# Patient Record
Sex: Male | Born: 1935 | Race: White | Hispanic: No | Marital: Married | Smoking: Former smoker
Health system: Southern US, Community
[De-identification: ages and names within clinical notes are randomized; demographics above are authoritative.]

## PROBLEM LIST (undated history)

## (undated) DIAGNOSIS — C3490 Malignant neoplasm of unspecified part of unspecified bronchus or lung: Secondary | ICD-10-CM

## (undated) DIAGNOSIS — R911 Solitary pulmonary nodule: Secondary | ICD-10-CM

## (undated) DIAGNOSIS — C3491 Malignant neoplasm of unspecified part of right bronchus or lung: Secondary | ICD-10-CM

## (undated) DIAGNOSIS — E119 Type 2 diabetes mellitus without complications: Secondary | ICD-10-CM

## (undated) DIAGNOSIS — E785 Hyperlipidemia, unspecified: Secondary | ICD-10-CM

## (undated) DIAGNOSIS — I639 Cerebral infarction, unspecified: Secondary | ICD-10-CM

## (undated) DIAGNOSIS — Z8701 Personal history of pneumonia (recurrent): Secondary | ICD-10-CM

## (undated) DIAGNOSIS — K469 Unspecified abdominal hernia without obstruction or gangrene: Secondary | ICD-10-CM

## (undated) DIAGNOSIS — I4891 Unspecified atrial fibrillation: Secondary | ICD-10-CM

## (undated) DIAGNOSIS — L729 Follicular cyst of the skin and subcutaneous tissue, unspecified: Secondary | ICD-10-CM

## (undated) DIAGNOSIS — K5792 Diverticulitis of intestine, part unspecified, without perforation or abscess without bleeding: Secondary | ICD-10-CM

## (undated) DIAGNOSIS — K219 Gastro-esophageal reflux disease without esophagitis: Secondary | ICD-10-CM

## (undated) DIAGNOSIS — M1990 Unspecified osteoarthritis, unspecified site: Secondary | ICD-10-CM

## (undated) DIAGNOSIS — E538 Deficiency of other specified B group vitamins: Secondary | ICD-10-CM

## (undated) HISTORY — PX: BACK SURGERY: SHX140

## (undated) HISTORY — DX: Type 2 diabetes mellitus without complications: E11.9

## (undated) HISTORY — PX: COLONOSCOPY W/ POLYPECTOMY: SHX1380

## (undated) HISTORY — DX: Deficiency of other specified B group vitamins: E53.8

## (undated) HISTORY — DX: Unspecified osteoarthritis, unspecified site: M19.90

## (undated) HISTORY — PX: CHOLECYSTECTOMY: SHX55

## (undated) HISTORY — PX: CYST EXCISION: SHX5701

## (undated) HISTORY — PX: HERNIA REPAIR: SHX51

## (undated) HISTORY — DX: Solitary pulmonary nodule: R91.1

## (undated) HISTORY — PX: LUMBAR DISC SURGERY: SHX700

## (undated) HISTORY — DX: Personal history of pneumonia (recurrent): Z87.01

## (undated) HISTORY — PX: EYE SURGERY: SHX253

## (undated) HISTORY — DX: Unspecified atrial fibrillation: I48.91

## (undated) HISTORY — DX: Malignant neoplasm of unspecified part of right bronchus or lung: C34.91

## (undated) HISTORY — PX: PORTACATH PLACEMENT: SHX2246

## (undated) HISTORY — DX: Malignant neoplasm of unspecified part of unspecified bronchus or lung: C34.90

---

## 2007-09-27 ENCOUNTER — Emergency Department (HOSPITAL_COMMUNITY): Admission: EM | Admit: 2007-09-27 | Discharge: 2007-09-27 | Payer: Self-pay | Admitting: Emergency Medicine

## 2009-01-31 ENCOUNTER — Inpatient Hospital Stay (HOSPITAL_COMMUNITY): Admission: RE | Admit: 2009-01-31 | Discharge: 2009-02-03 | Payer: Self-pay | Admitting: Neurosurgery

## 2010-09-16 LAB — CBC
HCT: 39.3 % (ref 39.0–52.0)
Hemoglobin: 13.3 g/dL (ref 13.0–17.0)
MCHC: 33.7 g/dL (ref 30.0–36.0)
MCV: 91.3 fL (ref 78.0–100.0)
Platelets: 257 10*3/uL (ref 150–400)
RBC: 4.31 MIL/uL (ref 4.22–5.81)
RDW: 13.6 % (ref 11.5–15.5)
WBC: 10.1 10*3/uL (ref 4.0–10.5)

## 2010-09-16 LAB — GLUCOSE, CAPILLARY
Glucose-Capillary: 107 mg/dL — ABNORMAL HIGH (ref 70–99)
Glucose-Capillary: 113 mg/dL — ABNORMAL HIGH (ref 70–99)
Glucose-Capillary: 114 mg/dL — ABNORMAL HIGH (ref 70–99)
Glucose-Capillary: 129 mg/dL — ABNORMAL HIGH (ref 70–99)
Glucose-Capillary: 133 mg/dL — ABNORMAL HIGH (ref 70–99)
Glucose-Capillary: 138 mg/dL — ABNORMAL HIGH (ref 70–99)
Glucose-Capillary: 140 mg/dL — ABNORMAL HIGH (ref 70–99)
Glucose-Capillary: 142 mg/dL — ABNORMAL HIGH (ref 70–99)
Glucose-Capillary: 144 mg/dL — ABNORMAL HIGH (ref 70–99)
Glucose-Capillary: 149 mg/dL — ABNORMAL HIGH (ref 70–99)
Glucose-Capillary: 149 mg/dL — ABNORMAL HIGH (ref 70–99)
Glucose-Capillary: 158 mg/dL — ABNORMAL HIGH (ref 70–99)
Glucose-Capillary: 211 mg/dL — ABNORMAL HIGH (ref 70–99)
Glucose-Capillary: 98 mg/dL (ref 70–99)

## 2010-09-16 LAB — BASIC METABOLIC PANEL
BUN: 10 mg/dL (ref 6–23)
CO2: 26 mEq/L (ref 19–32)
Calcium: 9 mg/dL (ref 8.4–10.5)
Chloride: 106 mEq/L (ref 96–112)
Creatinine, Ser: 0.85 mg/dL (ref 0.4–1.5)
GFR calc Af Amer: >60 mL/min (ref >60)
GFR calc non Af Amer: >60 mL/min (ref >60)
Glucose, Bld: 209 mg/dL — ABNORMAL HIGH (ref 70–99)
Potassium: 4.3 mEq/L (ref 3.5–5.1)
Sodium: 140 mEq/L (ref 135–145)

## 2010-09-16 LAB — TYPE AND SCREEN
ABO/RH(D): B POS
Antibody Screen: NEGATIVE

## 2010-09-16 LAB — ABO/RH: ABO/RH(D): B POS
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
## 2010-10-24 NOTE — Op Note (Signed)
[name], [name]                ACCOUNT NO.:  [id number]   MEDICAL RECORD NO.:  [id number]          PATIENT TYPE:  INP   LOCATION:  3030                         FACILITY:  [facility]   PHYSICIAN:  Hewitt Shorts, M.D.DATE OF BIRTH:  23-Feb-1931   DATE OF PROCEDURE:  01/31/2009  DATE OF DISCHARGE:                               OPERATIVE REPORT   PREOPERATIVE DIAGNOSES:  1. L4-5 dynamic degenerative grade 1 spondylolisthesis.  2. Left L4-5 synovial cyst.  3. Lumbar spondylosis.  4. Lumbar radiculopathy.   POSTOPERATIVE DIAGNOSES:  1. L4-5 dynamic degenerative grade 1 spondylolisthesis.  2. Left L4-5 synovial cyst.  3. Lumbar spondylosis.  4. Lumbar radiculopathy.   PROCEDURE:  1. Bilateral L4-5 lumbar decompression including bilateral laminotomy,      facetectomy, and foraminotomy with decompression of the L4 and L5      nerve roots, with decompression beyond that required for interbody      fusion with microdissection, resection of left L4-5 synovial cyst,      bilateral L4-5 lumbar microdiscectomy and posterior lumbar      interbody fusion with AVS PEEK interbody implants and mosaic with      bone marrow aspirate, and a bilateral L4-5 posterolateral      arthrodesis with radius posterior instrumentation and mosaic with      bone marrow aspirates and Infuse.   SURGEON:  Hewitt Shorts, MD   ASSISTANT:  Nelia Shi. Webb Silversmith, RN  Payton Doughty, MD   ANESTHESIA:  General endotracheal.   INDICATIONS:  The patient is a 77 year old man who presented with a left  lumbar radiculopathy.  He was found to have advanced degeneration at the  L4-5 level with significant facet arthropathy with associated left L4-5  synovial cyst.  X-ray showed a grade 1 dynamic degenerative  spondylolisthesis of L4-5 and a decision was made to proceed with  decompression and arthrodesis.   PROCEDURE:  The patient was brought to the operating room and placed  under general endotracheal anesthesia.  The  patient was turned to a  prone position.  Lumbar region was prepped with Betadine soap and  solution and draped in a sterile fashion.  The midline was infiltrated  with local anesthetic with epinephrine and x-ray was taken.  The L4-5  level was identified and a midline incision was made over the L4-5 level  and carried down through the subcutaneous tissue.  Bipolar cautery and  electrocautery were used to maintain hemostasis.  Dissection was carried  down to the lumbar fascia, which was incised bilaterally.  The  paraspinal muscles were dissected from the spinous process and lamina in  a subperiosteal fashion.  Additional x-rays were taken and the L4-5  intralaminar space was identified.  Dissection was then carried out over  the hypertrophic facet complexes bilaterally and then using  magnification and using microdissection and microsurgical technique.  Bilateral decompression was performed using the [employer]ker high speed drill  and Kerrison punches and double-action rongeurs.  The facets were  hypertrophic and the facets were degenerated and widened.  Bilateral  laminotomy was performed.  Dissection was  carried out laterally and the  transverse process of L4 and L5 were exposed bilaterally and  decorticated.  As we began to remove his ligamentum flavum, on the left  side we encountered the moderate-sized synovial cyst.  This was  carefully freed from the dura and then removed in a piecemeal fashion  and good decompression was achieved of the thecal sac and nerve roots  bilaterally including at L4 and L5 nerve roots bilaterally.  The  decompression was extended laterally to include the facets and then we  exposed the annulus of the L4-5 disk, overlying epidural veins were  coagulated and divided.   We then proceeded with the bilateral L4-5 diskectomy incising the  annulus and continuing the diskectomy using a variety of microcurettes  and pituitary rongeurs.  Spondylotic ridging of  posterior aspect of the  L4 and L5 vertebrae were removed using Kerrison punches and a thorough  diskectomy was performed.  We then worked on removing the cartilaginous  endplates of the vertebral body surfaces down to a good bony surface  with interbody arthrodesis.  We then used paddle curette to further  prepare the endplates and then a sizers to select the appropriate size  interbody implants.   In the end, good decompression of the thecal sac and nerve roots was  achieved bilaterally.  We selected 11-mm wide, 14-mm height, 25-mm  depth, and 4 degrees of lordosis peak implants.   The C-arm fluoroscope was then draped and brought into the field to  provide additional guidance in identifying pedicle entry sites and  probing the pedicles.  We first probed the left L5 pedicle and aspirated  approximately 20 mL of bone marrow aspirate was injected over a 15 mL  strip of mosaic.  We then packed each of the PEEK implants with the  mosaic with bone marrow aspirate and then carefully retracted the thecal  sac and nerve roots, we placed the first implant on the right side.  It  was countersunk.  We then went back to the left side, packed the midline  with additional mosaic with bone marrow aspirate and then again  retracted the thecal sac and nerve root medially and placed the left  side of the implants.  We then packed additional mosaic with bone marrow  aspirate lateral to each of the implants.  Once the interbody  arthrodesis was completed, we proceeded with posterolateral arthrodesis.   Again using the C-arm fluoroscope, pedicle entry sites were identified  for L4 bilaterally as well as the right side at L5.  Each of the 4  pedicles was probed, examined with a ball probe.  Good bony surfaces  were noted.  We then taped each of them with a 5.25 mm tap, again  examined with a ball probe.  Good threading was noted.  Good bony  surfaces were noted and we placed a 5.75 x 40 mm screws  bilaterally at  each level.  Once all 4 screws were in place, we selected 30-mm  prelordosed rods.  Prior to placing the rods, we packed the lateral  gutter over the transverse processes and intertransverse space with  pledgets with Infuse, and the remaining mosaic with bone marrow  aspirates.  We then placed 30-mm rods.  Locking caps were placed.  Final  tightening was done once all 4 locking caps were placed.  Once all 4  locking caps were fully tightened against the counter torque, the wound  which had been irrigated numerous times of the  procedure with saline  solution and Bacitracin solution.  We checked once again for hemostasis.  We examined the thecal sac and nerve roots, which remained well  decompressed and then we proceeded with closure.  Paraspinal muscles  were approximated with interrupted undyed #1 Vicryl sutures.  Deep  fascia was closed with interrupted undyed #1 Vicryl sutures.  The  subcutaneous and subcuticular were closed with interrupted inverted 2-0  and 3-0 undyed Vicryl sutures.  The skin was approximated with  Dermabond.  The wound was dressed with Adaptic and sterile gauze.  The  procedure was tolerated well.  The estimated blood loss was 200  mL.  We did use a cell saver, but the cell saver technician felt that  there was insufficient blood loss to process the collected blood.  Sponge and needle count were correct.  Following surgery, the patient  was to be turned back to a supine position, reversed from the  anesthetic, extubated, and transferred to the recovery room for further  care.      Hewitt Shorts, M.D.  Electronically Signed     RWN/MEDQ  D:  01/31/2009  T:  01/31/2009  Job:  [id number]

## 2010-10-24 NOTE — Discharge Summary (Signed)
[name], [name] NO.:  [id number]   MEDICAL RECORD NO.:  [id number]          PATIENT TYPE:  INP   LOCATION:  3030                         FACILITY:  [facility]   PHYSICIAN:  Hewitt Shorts, M.D.DATE OF BIRTH:  03-22-1931   DATE OF ADMISSION:  01/31/2009  DATE OF DISCHARGE:  02/03/2009                               DISCHARGE SUMMARY   HISTORY OF PRESENT ILLNESS:  This is a 77 year old white male that was  admitted on January 31, 2009, due to L4-5 dynamic  spondylolisthesis at  L4-5.  He also had a synovial cyst at this level with spondylosis and  degenerative disk disease.  He was admitted for decompression and fusion  and underwent procedure on that day.   PROCEDURES:  L4-5 decompression and resection of synovial cyst with PLIF  and PLA of L4-5 level performed by Dr. Shirlean Kelly and assistant was  myself and Dr. Channing Mutters.   The patient was sent to recovery room after surgery and did well there  and was brought subsequently to unit 3000.   HOSPITAL COURSE:  The patient has progressed somewhat slowly over the  past 3 days.  In the first couple days, he had some issues with  dizziness, probably due to the use of the pain medicine.  He was on  morphine PCA for about 24 hours postoperatively and he has been given  Percocet p.o.  He was slow to ambulate for the first 2 days.  He has for  the last day and half he has been ambulating in the hall without  assistance 3-4 times a day.   His wound is clean and dry.  He has been given discharge instructions.  He notes how to use his brace and he knows to have it on when he is out  of bed, but not in bed.   DISCHARGE MEDICATIONS:  Same as admitting medications.  He takes:  1. Terazosin 10 mg overnight.  2. He was taking nabumetone 750 b.i.d., we have discontinued that.  3. Glipizide 5 mg twice a day.  4. Methocarbamol 750 mg as needed.  5. Lisinopril 10 mg half tablet daily.  6. Simvastatin 8 mg half tablet daily.  7. Zolpidem 10 mg overnight.  8. Pantoprazole 40 mg a day.  9. Lotrisone cream as needed.  10.He is being discharged with Percocet 5/325 one to two p.o. q.4-6 h.      p.r.n. pain, I am giving 40 with no refill.   DISCHARGE CONDITION:  Good.   DISCHARGE DIAGNOSES:  Status post L4-5 decompression and resection of  synovial cyst, posterior lumbar interbody fusion, and posterolateral  arthrodesis.   FOLLOWUP:  We will see the patient back in our office in 3 weeks for  followup with a AP and lateral lumbar spine view at that time.  If he  has any difficulty in interim, will give [place] a call.   His discharge instructions again were reinforced with he and his wife.      Russell L. Webb Silversmith, RN      Hewitt Shorts, M.D.  Electronically Signed  RLW/MEDQ  D:  02/03/2009  T:  02/04/2009  Job:  [id number]

## 2012-01-18 ENCOUNTER — Emergency Department (HOSPITAL_COMMUNITY)
Admission: EM | Admit: 2012-01-18 | Discharge: 2012-01-18 | Disposition: A | Discharge status: Home / Self Care | Payer: Medicare Other | Attending: Emergency Medicine | Admitting: Emergency Medicine

## 2012-01-18 ENCOUNTER — Encounter (HOSPITAL_COMMUNITY): Payer: Self-pay | Admitting: *Deleted

## 2012-01-18 DIAGNOSIS — L0292 Furuncle, unspecified: Secondary | ICD-10-CM

## 2012-01-18 DIAGNOSIS — L02429 Furuncle of limb, unspecified: Secondary | ICD-10-CM | POA: Insufficient documentation

## 2012-01-18 DIAGNOSIS — E119 Type 2 diabetes mellitus without complications: Secondary | ICD-10-CM | POA: Insufficient documentation

## 2012-01-18 DIAGNOSIS — Z87891 Personal history of nicotine dependence: Secondary | ICD-10-CM | POA: Insufficient documentation

## 2012-01-18 HISTORY — DX: Follicular cyst of the skin and subcutaneous tissue, unspecified: L72.9

## 2012-01-18 HISTORY — DX: Unspecified abdominal hernia without obstruction or gangrene: K46.9

## 2012-01-18 MED ORDER — DOXYCYCLINE HYCLATE 100 MG PO TABS: 100.0000 mg | ORAL_TABLET | Freq: Two times a day (BID) | ORAL | Status: AC

## 2012-01-18 MED ORDER — MUPIROCIN CALCIUM 2 % NA OINT: TOPICAL_OINTMENT | NASAL | Status: DC

## 2012-01-18 NOTE — ED Notes (Signed)
Rt. Groin abscess/boil, growing over 2 months. Wife sterilized a needle and pop the boil, and [name] drainage (moderate) came out. Here for evaluation.

## 2012-01-18 NOTE — ED Provider Notes (Signed)
History     CSN: [id number]  Arrival date & time 01/18/12  1307   First MD Initiated Contact with Patient 01/18/12 1324      Chief Complaint  Patient presents with  . Abscess    HPI The patient has had an area of skin irritation in the right groin area for a least the last month or 2. Patient states he thinks it got worse recently because he was mowing the lawn in the area got moist and irritated. The patient's wife noticed the swelling in the last day or 2 and she sterilized a needle and popped the boil. She noticed a moderate amount of drainage. The symptoms have improved significantly but because of his history of diabetes she wants to make sure that everything was okay. He has not had any fevers or chills. He has not had any difficulty with urinating. He denies testicular pain. Past Medical History  Diagnosis Date  . Hernia   . Diabetes mellitus   . Cyst of scrotum     Past Surgical History  Procedure Date  . Back surgery     History reviewed. No pertinent family history.  History  Substance Use Topics  . Smoking status: Former Games developer  . Smokeless tobacco: Not on file  . Alcohol Use: No      Review of Systems  All other systems reviewed and are negative.    Allergies  Review of patient's allergies indicates no known allergies.  Home Medications   Current Outpatient Rx  Name Route Sig Dispense Refill  . CARBOXYMETHYLCELLULOSE SODIUM 0.25 % OP SOLN Both Eyes Place 1 drop into both eyes 4 (four) times daily.    Marland Kitchen VITAMIN D 2000 UNITS PO CAPS Oral Take 2 capsules by mouth daily.    Marland Kitchen GLIPIZIDE 5 MG PO TABS Oral Take 2.5 mg by mouth 2 (two) times daily before a meal.    . LISINOPRIL 10 MG PO TABS Oral Take 5 mg by mouth daily.    Marland Kitchen PANTOPRAZOLE SODIUM 40 MG PO TBEC Oral Take 40 mg by mouth daily.    Marland Kitchen SIMVASTATIN 80 MG PO TABS Oral Take 80 mg by mouth at bedtime.    . TERAZOSIN HCL 10 MG PO CAPS Oral Take 10 mg by mouth at bedtime.    Marland Kitchen ZOLPIDEM TARTRATE 10 MG  PO TABS Oral Take 10 mg by mouth at bedtime as needed. For sleep    . DOXYCYCLINE HYCLATE 100 MG PO TABS Oral Take 1 tablet (100 mg total) by mouth 2 (two) times daily. 14 tablet 0  . MUPIROCIN CALCIUM 2 % NA OINT  Apply to rash twice daily 1 g 1    BP 146/61  Pulse 69  Temp 98.2 F (36.8 C) (Oral)  Resp 18  SpO2 95%  Physical Exam  Nursing note and vitals reviewed. Constitutional: He appears well-developed and well-nourished. No distress.  HENT:  Head: Normocephalic and atraumatic.  Right Ear: External ear normal.  Left Ear: External ear normal.  Eyes: Conjunctivae are normal. Right eye exhibits no discharge. Left eye exhibits no discharge. No scleral icterus.  Neck: Neck supple. No tracheal deviation present.  Cardiovascular: Normal rate.   Pulmonary/Chest: Effort normal. No stridor.  Abdominal: Soft. Bowel sounds are normal. He exhibits no distension. There is no tenderness. There is no rebound and no guarding.  Genitourinary: Penis normal.       Right inguinal area with small approximately dime size area of ulceration, no fluctuance, no significant  induration, no lymphangitic streaking, no testicular tenderness  Musculoskeletal: He exhibits no edema and no tenderness.  Neurological: He is alert. He has normal strength. No sensory deficit. Cranial nerve deficit:  no gross defecits noted. He exhibits normal muscle tone. He displays no seizure activity. Coordination normal.  Skin: Skin is warm and dry. No rash noted.  Psychiatric: He has a normal mood and affect.    ED Course  Procedures (including critical care time)  Labs Reviewed - No data to display No results found.   1. Furuncle       MDM  The patient's wife performed an incision and drainage procedure with a sterile needle. This seems to have helped significantly as he only has a small skin ulceration area. There is no evidence to suggest persistent abscess. I will discharge him home on topical and oral  antibiotics. Precautions were discussed.       Celene Kras, MD 01/18/12 1352

## 2012-01-18 NOTE — ED Notes (Signed)
Pt presents with abscess to R groin that began as a "pimple"  X 1 month ago and gradually increased.  Wife reports area was red, swollen and hard, she opened area with yellowish drainage noted on gauze.

## 2012-07-29 ENCOUNTER — Encounter (INDEPENDENT_AMBULATORY_CARE_PROVIDER_SITE_OTHER): Payer: Self-pay | Admitting: *Deleted

## 2012-07-30 ENCOUNTER — Ambulatory Visit (INDEPENDENT_AMBULATORY_CARE_PROVIDER_SITE_OTHER): Payer: Medicare Other | Admitting: Internal Medicine

## 2012-07-30 ENCOUNTER — Encounter (INDEPENDENT_AMBULATORY_CARE_PROVIDER_SITE_OTHER): Payer: Self-pay | Admitting: Internal Medicine

## 2012-07-30 VITALS — BP 110/48 | HR 64 | Temp 98.0°F | Ht 71.0 in | Wt 199.6 lb

## 2012-07-30 DIAGNOSIS — E119 Type 2 diabetes mellitus without complications: Secondary | ICD-10-CM

## 2012-07-30 DIAGNOSIS — R1314 Dysphagia, pharyngoesophageal phase: Secondary | ICD-10-CM

## 2012-07-30 NOTE — Patient Instructions (Addendum)
Pill esophagram.

## 2012-07-30 NOTE — Progress Notes (Addendum)
Subjective:     Patient ID: [name], male   DOB: March 31, 1931, 79 y.o.   MRN: [id number]  HPI Referred to our office for dysphagia. Pills and food are lodging in his esophagus. Symptoms x 6-8 months.He tells me he has to cough the food back up. Appetite is good. No weight loss.  He occasionally bloats. He does c/o of some constipation. He has BM about daily. He takes Miralax for his BMs. Last colonoscopy 2012 by Dr. Joseph Art in [facility].  One 5mm polyp in the mid transverse coon.  Diverticulosis sigmoid colon and descending colon.  Exam otherwise normal.  Family hx of colon cancer. (brother) EGD years ago for abdominal pain. ? Hiatal hernia. Also c/o rt lateral pain: mild. Hx of kidney stones. Wife will get records from [facility].   Review of Systems see hpi Current Outpatient Prescriptions  Medication Sig Dispense Refill  . aspirin 81 MG tablet Take 81 mg by mouth daily.      . Carboxymethylcellulose Sodium 0.25 % SOLN Place 1 drop into both eyes 4 (four) times daily.      . Cholecalciferol (VITAMIN D) 2000 UNITS CAPS Take 2 capsules by mouth daily.      Marland Kitchen glipiZIDE (GLUCOTROL) 5 MG tablet Take 2.5 mg by mouth 2 (two) times daily before a meal.      . lisinopril (PRINIVIL,ZESTRIL) 10 MG tablet Take 5 mg by mouth daily.      . pantoprazole (PROTONIX) 40 MG tablet Take 40 mg by mouth daily.      . simvastatin (ZOCOR) 80 MG tablet Take 80 mg by mouth at bedtime.      Marland Kitchen terazosin (HYTRIN) 10 MG capsule Take 10 mg by mouth at bedtime.      Marland Kitchen zolpidem (AMBIEN) 10 MG tablet Take 10 mg by mouth at bedtime as needed. For sleep      . mupirocin nasal ointment (BACTROBAN) 2 % Apply to rash twice daily  1 g  1   No current facility-administered medications for this visit.   Past Medical History  Diagnosis Date  . Hernia   . Diabetes mellitus   . Cyst of scrotum    Past Surgical History  Procedure Laterality Date  . Back surgery     No Known Allergies      Objective:   Physical Exam   Filed Vitals:   07/30/12 1007  BP: 110/48  Pulse: 64  Temp: 98 F (36.7 C)  Height: 5\' 11"  (1.803 m)  Weight: 199 lb 9.6 oz (90.538 kg)   Alert and oriented. Skin warm and dry. Oral mucosa is moist.   . Sclera anicteric, conjunctivae is pink. Thyroid not enlarged. No cervical lymphadenopathy. Lungs clear. Heart regular rate and rhythm.  Abdomen is soft. Bowel sounds are positive. No hepatomegaly. No abdominal masses felt. No tenderness.  No edema to lower extremities.  .     Assessment:    Dysphagia. Stricture needs to be ruled out.     Plan:    Pill esophagram.Further recommendations to follow.

## 2012-08-05 ENCOUNTER — Ambulatory Visit (HOSPITAL_COMMUNITY)
Admission: RE | Admit: 2012-08-05 | Discharge: 2012-08-05 | Disposition: A | Discharge status: Home / Self Care | Payer: Medicare Other | Source: Ambulatory Visit | Attending: Internal Medicine | Admitting: Internal Medicine

## 2012-08-05 DIAGNOSIS — R1310 Dysphagia, unspecified: Secondary | ICD-10-CM | POA: Insufficient documentation

## 2012-08-05 DIAGNOSIS — R1314 Dysphagia, pharyngoesophageal phase: Secondary | ICD-10-CM

## 2012-08-05 DIAGNOSIS — K222 Esophageal obstruction: Secondary | ICD-10-CM | POA: Insufficient documentation

## 2012-08-05 DIAGNOSIS — K225 Diverticulum of esophagus, acquired: Secondary | ICD-10-CM | POA: Insufficient documentation

## 2012-08-08 ENCOUNTER — Telehealth (INDEPENDENT_AMBULATORY_CARE_PROVIDER_SITE_OTHER): Payer: Self-pay | Admitting: Internal Medicine

## 2012-08-11 ENCOUNTER — Ambulatory Visit (INDEPENDENT_AMBULATORY_CARE_PROVIDER_SITE_OTHER): Payer: Medicare Other | Admitting: Internal Medicine

## 2012-08-11 NOTE — Telephone Encounter (Signed)
Opened in error

## 2012-08-19 ENCOUNTER — Encounter (INDEPENDENT_AMBULATORY_CARE_PROVIDER_SITE_OTHER): Payer: Self-pay | Admitting: Internal Medicine

## 2012-08-19 ENCOUNTER — Ambulatory Visit (INDEPENDENT_AMBULATORY_CARE_PROVIDER_SITE_OTHER): Payer: Medicare Other | Admitting: Internal Medicine

## 2012-08-19 VITALS — BP 136/46 | HR 72 | Ht 69.5 in | Wt 198.9 lb

## 2012-08-19 DIAGNOSIS — R1310 Dysphagia, unspecified: Secondary | ICD-10-CM

## 2012-08-19 DIAGNOSIS — G8929 Other chronic pain: Secondary | ICD-10-CM

## 2012-08-19 DIAGNOSIS — R1011 Right upper quadrant pain: Secondary | ICD-10-CM

## 2012-08-19 NOTE — Progress Notes (Signed)
Subjective:     Patient ID: [name], male   DOB: 12-30-1930, 79 y.o.   MRN: [id number]  HPI Here today for f/u. Recently underwent an Esophagram which revealeI MPRESSION:  Minimal laryngeal penetration without aspiration.  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  Nonobstructing Schatzki ring above a tiny sliding hiatal hernia.   He states today he is doing pretty good. No dysphagia. He is chewing his food well.  He presents today with c/o rt upper quadrant tenderness radiating into his back.  Pain occurs when he moves. Pain for over a year. Pain is not constant. No weight loss. Appetite is good.   Last colonoscopy 2012 by Dr. Joseph Art at [facility] and had one polyp.      Review of Systems Current Outpatient Prescriptions  Medication Sig Dispense Refill  . aspirin 81 MG tablet Take 81 mg by mouth daily.      . Carboxymethylcellulose Sodium 0.25 % SOLN Place 1 drop into both eyes 4 (four) times daily.      . Cholecalciferol (VITAMIN D) 2000 UNITS CAPS Take 2 capsules by mouth daily.      Marland Kitchen glipiZIDE (GLUCOTROL) 5 MG tablet Take 2.5 mg by mouth 2 (two) times daily before a meal.      . lisinopril (PRINIVIL,ZESTRIL) 10 MG tablet Take 5 mg by mouth daily.      . pantoprazole (PROTONIX) 40 MG tablet Take 40 mg by mouth 2 (two) times daily before a meal.       . simvastatin (ZOCOR) 80 MG tablet Take 80 mg by mouth at bedtime.      Marland Kitchen terazosin (HYTRIN) 10 MG capsule Take 10 mg by mouth at bedtime.      Marland Kitchen zolpidem (AMBIEN) 10 MG tablet Take 10 mg by mouth at bedtime as needed. For sleep      . mupirocin nasal ointment (BACTROBAN) 2 % Apply to rash twice daily  1 g  1   No current facility-administered medications for this visit.   Past Medical History  Diagnosis Date  . Hernia   . Diabetes mellitus   . Cyst of scrotum    Past Surgical History  Procedure Laterality Date  . Back surgery     No Known Allergies      Objective:   Physical Exam  Filed  Vitals:   08/19/12 1112  BP: 136/46  Pulse: 72  Height: 5' 9.5" (1.765 m)  Weight: 198 lb 14.4 oz (90.22 kg)   Alert and oriented. Skin warm and dry. Oral mucosa is moist.   . Sclera anicteric, conjunctivae is pink. Thyroid not enlarged. No cervical lymphadenopathy. Lungs clear. Heart regular rate and rhythm.  Abdomen is soft. Bowel sounds are positive. No hepatomegaly. No abdominal masses felt. No tenderness.  No edema to lower extremities. Patient has pain to back and rt upper quadrant when trying to sit up from a prone position today while in office.     Assessment:   Dysphagia which is better now. He does have a Schatzki ring which is non-obstruction. Rt upper quadrant pain on movement.  Suspect is coming from his back.     Plan:    Take tylenol as needed for pain. OV in one year. Talk with PCP concerning back pain.

## 2012-08-19 NOTE — Patient Instructions (Addendum)
OV in yr. Continue the Miralax. Tylenol as needed.

## 2012-09-04 ENCOUNTER — Encounter (INDEPENDENT_AMBULATORY_CARE_PROVIDER_SITE_OTHER): Payer: Self-pay

## 2013-05-27 ENCOUNTER — Encounter (INDEPENDENT_AMBULATORY_CARE_PROVIDER_SITE_OTHER): Payer: Self-pay | Admitting: *Deleted

## 2013-08-25 ENCOUNTER — Ambulatory Visit (INDEPENDENT_AMBULATORY_CARE_PROVIDER_SITE_OTHER): Payer: Medicare Other | Admitting: Internal Medicine

## 2013-09-18 ENCOUNTER — Encounter (HOSPITAL_COMMUNITY): Payer: Self-pay | Admitting: Emergency Medicine

## 2013-09-18 ENCOUNTER — Emergency Department (HOSPITAL_COMMUNITY): Payer: Medicare Other

## 2013-09-18 ENCOUNTER — Emergency Department (HOSPITAL_COMMUNITY)
Admission: EM | Admit: 2013-09-18 | Discharge: 2013-09-18 | Disposition: A | Discharge status: Home / Self Care | Payer: Medicare Other | Attending: Emergency Medicine | Admitting: Emergency Medicine

## 2013-09-18 DIAGNOSIS — Y92009 Unspecified place in unspecified non-institutional (private) residence as the place of occurrence of the external cause: Secondary | ICD-10-CM | POA: Insufficient documentation

## 2013-09-18 DIAGNOSIS — Y939 Activity, unspecified: Secondary | ICD-10-CM | POA: Insufficient documentation

## 2013-09-18 DIAGNOSIS — Z79899 Other long term (current) drug therapy: Secondary | ICD-10-CM | POA: Insufficient documentation

## 2013-09-18 DIAGNOSIS — Z8719 Personal history of other diseases of the digestive system: Secondary | ICD-10-CM | POA: Insufficient documentation

## 2013-09-18 DIAGNOSIS — R296 Repeated falls: Secondary | ICD-10-CM | POA: Insufficient documentation

## 2013-09-18 DIAGNOSIS — W19XXXA Unspecified fall, initial encounter: Secondary | ICD-10-CM

## 2013-09-18 DIAGNOSIS — Z7982 Long term (current) use of aspirin: Secondary | ICD-10-CM | POA: Insufficient documentation

## 2013-09-18 DIAGNOSIS — S335XXA Sprain of ligaments of lumbar spine, initial encounter: Secondary | ICD-10-CM | POA: Insufficient documentation

## 2013-09-18 DIAGNOSIS — S39012A Strain of muscle, fascia and tendon of lower back, initial encounter: Secondary | ICD-10-CM

## 2013-09-18 DIAGNOSIS — Z872 Personal history of diseases of the skin and subcutaneous tissue: Secondary | ICD-10-CM | POA: Insufficient documentation

## 2013-09-18 DIAGNOSIS — Z87891 Personal history of nicotine dependence: Secondary | ICD-10-CM | POA: Insufficient documentation

## 2013-09-18 DIAGNOSIS — E119 Type 2 diabetes mellitus without complications: Secondary | ICD-10-CM | POA: Insufficient documentation

## 2013-09-18 LAB — URINE MICROSCOPIC-ADD ON

## 2013-09-18 LAB — URINALYSIS, ROUTINE W REFLEX MICROSCOPIC
Bilirubin Urine: NEGATIVE
Glucose, UA: NEGATIVE mg/dL
Ketones, ur: NEGATIVE mg/dL
Leukocytes, UA: NEGATIVE
Nitrite: NEGATIVE
Protein, ur: NEGATIVE mg/dL
Specific Gravity, Urine: <1.005 — ABNORMAL LOW (ref 1.005–1.030)
Urobilinogen, UA: 0.2 mg/dL (ref 0.0–1.0)
pH: 7 (ref 5.0–8.0)

## 2013-09-18 MED ORDER — CYCLOBENZAPRINE HCL 10 MG PO TABS: 10.0000 mg | ORAL_TABLET | Freq: Three times a day (TID) | ORAL | Status: DC | PRN

## 2013-09-18 MED ORDER — IBUPROFEN 600 MG PO TABS: 600.0000 mg | ORAL_TABLET | Freq: Three times a day (TID) | ORAL | Status: DC

## 2013-09-18 NOTE — ED Notes (Signed)
Fell at home a week ago, pain in right flank area, hurts to walk or move, states it is getting worse, history of back surgery

## 2013-09-18 NOTE — Discharge Instructions (Signed)
Lumbosacral Strain Lumbosacral strain is a strain of any of the parts that make up your lumbosacral vertebrae. Your lumbosacral vertebrae are the bones that make up the lower third of your backbone. Your lumbosacral vertebrae are held together by muscles and tough, fibrous tissue (ligaments).  CAUSES  A sudden blow to your back can cause lumbosacral strain. Also, anything that causes an excessive stretch of the muscles in the low back can cause this strain. This is typically seen when people exert themselves strenuously, fall, lift heavy objects, bend, or crouch repeatedly. RISK FACTORS  Physically demanding work.  Participation in pushing or pulling sports or sports that require sudden twist of the back (tennis, golf, baseball).  Weight lifting.  Excessive lower back curvature.  Forward-tilted pelvis.  Weak back or abdominal muscles or both.  Tight hamstrings. SIGNS AND SYMPTOMS  Lumbosacral strain may cause pain in the area of your injury or pain that moves (radiates) down your leg.  DIAGNOSIS Your health care provider can often diagnose lumbosacral strain through a physical exam. In some cases, you may need tests such as X-ray exams.  TREATMENT  Treatment for your lower back injury depends on many factors that your clinician will have to evaluate. However, most treatment will include the use of anti-inflammatory medicines. HOME CARE INSTRUCTIONS   Avoid hard physical activities (tennis, racquetball, waterskiing) if you are not in proper physical condition for it. This may aggravate or create problems.  If you have a back problem, avoid sports requiring sudden body movements. Swimming and walking are generally safer activities.  Maintain good posture.  Maintain a healthy weight.  For acute conditions, you may put ice on the injured area.  Put ice in a plastic bag.  Place a towel between your skin and the bag.  Leave the ice on for 20 minutes, 2 3 times a day.  When the  low back starts healing, stretching and strengthening exercises may be recommended. SEEK MEDICAL CARE IF:  Your back pain is getting worse.  You experience severe back pain not relieved with medicines. SEEK IMMEDIATE MEDICAL CARE IF:   You have numbness, tingling, weakness, or problems with the use of your arms or legs.  There is a change in bowel or bladder control.  You have increasing pain in any area of the body, including your belly (abdomen).  You notice shortness of breath, dizziness, or feel faint.  You feel sick to your stomach (nauseous), are throwing up (vomiting), or become sweaty.  You notice discoloration of your toes or legs, or your feet get very cold. MAKE SURE YOU:   Understand these instructions.  Will watch your condition.  Will get help right away if you are not doing well or get worse. Document Released: 03/07/2005 Document Revised: 03/18/2013 Document Reviewed: 01/14/2013 Exit[employer] Patient Information 2014 [employer].  

## 2013-09-18 NOTE — ED Provider Notes (Signed)
CSN: [id number]     Arrival date & time 09/18/13  1012 History   First MD Initiated Contact with Patient 09/18/13 1052     Chief Complaint  Patient presents with  . Fall     (Consider location/radiation/quality/duration/timing/severity/associated sxs/prior Treatment) HPI Comments: [name] is a 80 y.o. male who presents to the Emergency Department complaining of persistent pain to the right flank and lower back.  Pain has been persistent for one week after a mechanical fall. Pain is worse with movement and improves with rest.  He denies head injury, LOC or neck pain.  He also denies hematuria, numbness or weakness of the extremities, incontinence/retention of bladder or bowel,  shortness of breath, dizziness, or pain radiating to the lower extremities.    The history is provided by the patient.    Past Medical History  Diagnosis Date  . Hernia   . Diabetes mellitus   . Cyst of scrotum    Past Surgical History  Procedure Laterality Date  . Back surgery     No family history on file. History  Substance Use Topics  . Smoking status: Former Research scientist (life sciences)  . Smokeless tobacco: Not on file  . Alcohol Use: No    Review of Systems  Constitutional: Negative for fever.  Respiratory: Negative for chest tightness and shortness of breath.   Gastrointestinal: Negative for vomiting, abdominal pain and constipation.  Genitourinary: Negative for dysuria, hematuria, flank pain, decreased urine volume and difficulty urinating.       No perineal numbness or incontinence of urine or feces  Musculoskeletal: Positive for back pain. Negative for joint swelling.  Skin: Negative for rash.  Neurological: Negative for dizziness, syncope, weakness, numbness and headaches.  All other systems reviewed and are negative.     Allergies  Review of patient's allergies indicates no known allergies.  Home Medications   Current Outpatient Rx  Name  Route  Sig  Dispense  Refill  . aspirin 81 MG tablet   Oral   Take 81 mg by mouth daily.         . Carboxymethylcellulose Sodium 0.25 % SOLN   Both Eyes   Place 1 drop into both eyes daily.          . Cholecalciferol (VITAMIN D) 2000 UNITS CAPS   Oral   Take 1 capsule by mouth daily.          Marland Kitchen glipiZIDE (GLUCOTROL) 5 MG tablet   Oral   Take 2.5 mg by mouth 2 (two) times daily before a meal.         . lisinopril (PRINIVIL,ZESTRIL) 10 MG tablet   Oral   Take 5 mg by mouth daily.         Marland Kitchen loratadine (CLARITIN) 10 MG tablet   Oral   Take 10 mg by mouth daily.         . pantoprazole (PROTONIX) 40 MG tablet   Oral   Take 40 mg by mouth daily.          . simvastatin (ZOCOR) 80 MG tablet   Oral   Take 40 mg by mouth at bedtime.          Marland Kitchen terazosin (HYTRIN) 10 MG capsule   Oral   Take 10 mg by mouth at bedtime.         Marland Kitchen zolpidem (AMBIEN) 10 MG tablet   Oral   Take 5 mg by mouth at bedtime as needed for sleep. For sleep  BP 125/48  Pulse 64  Temp(Src) 98 F (36.7 C) (Oral)  Resp 17  Ht 5\' 10"  (1.778 m)  Wt 205 lb (92.987 kg)  BMI 29.41 kg/m2  SpO2 92% Physical Exam  Nursing note and vitals reviewed. Constitutional: He is oriented to person, place, and time. He appears well-developed and well-nourished. No distress.  HENT:  Head: Normocephalic and atraumatic.  Neck: Normal range of motion. Neck supple.  Cardiovascular: Normal rate, regular rhythm, normal heart sounds and intact distal pulses.   No murmur heard. Pulmonary/Chest: Effort normal and breath sounds normal. No respiratory distress. He has no wheezes. He has no rales. He exhibits no tenderness.  Abdominal: Soft. He exhibits no distension and no mass. There is no tenderness. There is no rebound and no guarding.  Musculoskeletal: He exhibits tenderness. He exhibits no edema.       Lumbar back: He exhibits tenderness and pain. He exhibits normal range of motion, no swelling, no deformity, no laceration and normal pulse.  ttp of the  right lumbar paraspinal muscles.  No spinal tenderness.  DP pulses are brisk and symmetrical.  Distal sensation intact.  Hip Flexors/Extensors are intact.  Pt has normal strength against resistance of bilateral lower extremities.     Neurological: He is alert and oriented to person, place, and time. He has normal strength. No sensory deficit. He exhibits normal muscle tone. Coordination and gait normal.  Reflex Scores:      Patellar reflexes are 2+ on the right side and 2+ on the left side.      Achilles reflexes are 2+ on the right side and 2+ on the left side. Skin: Skin is warm and dry. No rash noted.    ED Course  Procedures (including critical care time) Labs Review Labs Reviewed  URINALYSIS, ROUTINE W REFLEX MICROSCOPIC - Abnormal; Notable for the following:    Specific Gravity, Urine <1.005 (*)    Hgb urine dipstick TRACE (*)    All other components within normal limits  URINE MICROSCOPIC-ADD ON   Imaging Review Dg Ribs Unilateral W/chest Right  09/18/2013   CLINICAL DATA:  Right-sided rib pain status post fall  EXAM: RIGHT RIBS AND CHEST - 3+ VIEW  COMPARISON:  Chest x-ray dated January 27, 2009  FINDINGS: The lungs are adequately inflated. There is no focal infiltrate. Coarse lung markings at the left lung base are stable. The cardiac silhouette is normal in size. The pulmonary vascularity is not engorged. The mediastinum is normal in width. There is no pleural effusion or pneumothorax. The right rib detail films reveal no evidence of an acute displaced or nondisplaced rib fracture. The gas pattern in the right upper quadrant of the abdomen is within the limits of normal.  IMPRESSION: There is no acute bony abnormality of the right rib cage. There is no evidence of active cardiopulmonary disease.   Electronically Signed   By: David  Martinique   On: 09/18/2013 13:01   Dg Lumbar Spine Complete  09/18/2013   CLINICAL DATA:  Low back pain status post fall history of previous posterior fusion  at L4-5.  EXAM: LUMBAR SPINE - COMPLETE 4+ VIEW  COMPARISON:  Lumbar spine series dated is January 27, 2010  FINDINGS: The lumbar vertebral bodies are preserved in height. The intervertebral disc space heights are well maintained. There is no pars defect nor spondylolisthesis. The metallic fusion devices at the L4 and L5 levels appear intact.  IMPRESSION: There is no acute fracture or dislocation of the lumbar  spine.   Electronically Signed   By: David  Martinique   On: 09/18/2013 13:04   Dg Pelvis 1-2 Views  09/18/2013   CLINICAL DATA:  Low back pain status post fall.  EXAM: PELVIS - 1-2 VIEW  COMPARISON:  None  FINDINGS: The bony pelvis appears adequately mineralized. The hip joint spaces are preserved. No acute fracture nor dislocation is demonstrated. Post fusion changes at L4-5 are demonstrated.  IMPRESSION: There is no acute bony abnormality of the pelvis. The observed portions of the sacrum appear normal.   Electronically Signed   By: David  Martinique   On: 09/18/2013 13:02     EKG Interpretation None      MDM   Final diagnoses:  Lumbar strain  Fall    Patient is ambulatory, with steady gait.  No focal neuro deficits on exam.  No concerning symptoms for emergent neurological or infectious process .  Likely musculoskeletal injury.  Pt agrees to symptomatic treatment and also agrees to return here for any worsening symptoms  Patient also seen by Dr. Roderic Palau and care plan discussed.      Srikar Chiang L. Vanessa Fauquier, PA-C 09/20/13 1435

## 2013-09-18 NOTE — ED Notes (Addendum)
Pt up to ambulate .  Pt appears steady and has no difficulty ambulating.   States he is having a small amount of pain in the mid back area with ambulation.  States he feels like his legs are weak.

## 2013-09-20 NOTE — ED Provider Notes (Signed)
Medical screening examination/treatment/procedure(s) were performed by non-physician practitioner and as supervising physician I was immediately available for consultation/collaboration.   EKG Interpretation None        Maudry Diego, MD 09/20/13 1504

## 2014-05-27 ENCOUNTER — Emergency Department (INDEPENDENT_AMBULATORY_CARE_PROVIDER_SITE_OTHER): Payer: Medicare Other

## 2014-05-27 ENCOUNTER — Emergency Department (INDEPENDENT_AMBULATORY_CARE_PROVIDER_SITE_OTHER)
Admission: EM | Admit: 2014-05-27 | Discharge: 2014-05-27 | Disposition: A | Discharge status: Home / Self Care | Payer: Medicare Other | Source: Home / Self Care | Attending: Emergency Medicine | Admitting: Emergency Medicine

## 2014-05-27 ENCOUNTER — Encounter: Payer: Self-pay | Admitting: *Deleted

## 2014-05-27 DIAGNOSIS — S20211A Contusion of right front wall of thorax, initial encounter: Secondary | ICD-10-CM

## 2014-05-27 DIAGNOSIS — R0781 Pleurodynia: Secondary | ICD-10-CM

## 2014-05-27 DIAGNOSIS — W19XXXA Unspecified fall, initial encounter: Secondary | ICD-10-CM

## 2014-05-27 DIAGNOSIS — R52 Pain, unspecified: Secondary | ICD-10-CM

## 2014-05-27 HISTORY — DX: Hyperlipidemia, unspecified: E78.5

## 2014-05-27 NOTE — ED Notes (Signed)
[name] c/o RUQ abd and Rib area pain post fall yesterday.

## 2014-05-27 NOTE — ED Provider Notes (Signed)
CSN: [id number]     Arrival date & time 05/27/14  1002 History   First MD Initiated Contact with Patient 05/27/14 1035     Chief Complaint  Patient presents with  . Fall   (Consider location/radiation/quality/duration/timing/severity/associated sxs/prior Treatment) Patient is a 80 y.o. male presenting with fall. The history is provided by the patient. No language interpreter was used.  Fall This is a new problem. The current episode started yesterday. The problem occurs constantly. The problem has been gradually worsening. Associated symptoms include chest pain. Nothing aggravates the symptoms. He has tried nothing for the symptoms. The treatment provided no relief.  Pt fell yesterday.  Pt reports he hit on back and buttock.   Pt complains of pain to right side of back and right upper chest and abdomen.  Pt reports pain with lying down  Past Medical History  Diagnosis Date  . Hernia   . Diabetes mellitus   . Cyst of scrotum   . Hyperlipidemia    Past Surgical History  Procedure Laterality Date  . Back surgery    . Cholecystectomy     Family History  Problem Relation Age of Onset  . Cancer Brother     colon CA   History  Substance Use Topics  . Smoking status: Former Smoker    Quit date: 05/27/2004  . Smokeless tobacco: Not on file  . Alcohol Use: No    Review of Systems  Cardiovascular: Positive for chest pain.  All other systems reviewed and are negative.   Allergies  Review of patient's allergies indicates no known allergies.  Home Medications   Prior to Admission medications   Medication Sig Start Date End Date Taking? Authorizing Provider  aspirin 81 MG tablet Take 81 mg by mouth daily.   Yes Historical Provider, MD  Cholecalciferol (VITAMIN D) 2000 UNITS CAPS Take 1 capsule by mouth daily.    Yes Historical Provider, MD  glipiZIDE (GLUCOTROL) 5 MG tablet Take 2.5 mg by mouth 2 (two) times daily before a meal.   Yes Historical Provider, MD  lisinopril  (PRINIVIL,ZESTRIL) 10 MG tablet Take 5 mg by mouth daily.   Yes Historical Provider, MD  pantoprazole (PROTONIX) 40 MG tablet Take 40 mg by mouth daily.    Yes Historical Provider, MD  simvastatin (ZOCOR) 80 MG tablet Take 40 mg by mouth at bedtime.    Yes Historical Provider, MD  terazosin (HYTRIN) 10 MG capsule Take 10 mg by mouth at bedtime.   Yes Historical Provider, MD  Carboxymethylcellulose Sodium 0.25 % SOLN Place 1 drop into both eyes daily.     Historical Provider, MD  cyclobenzaprine (FLEXERIL) 10 MG tablet Take 1 tablet (10 mg total) by mouth 3 (three) times daily as needed. 09/18/13   Tammy L. Triplett, PA-C  ibuprofen (ADVIL,MOTRIN) 600 MG tablet Take 1 tablet (600 mg total) by mouth 3 (three) times daily. Take with food 09/18/13   Tammy L. Triplett, PA-C  loratadine (CLARITIN) 10 MG tablet Take 10 mg by mouth daily.    Historical Provider, MD  zolpidem (AMBIEN) 10 MG tablet Take 5 mg by mouth at bedtime as needed for sleep. For sleep    Historical Provider, MD   BP 151/62 mmHg  Pulse 71  Temp(Src) 97.4 F (36.3 C) (Oral)  Resp 18  Ht 5\' 10"  (1.778 m)  Wt 199 lb (90.266 kg)  BMI 28.55 kg/m2  SpO2 96% Physical Exam  Constitutional: He appears well-developed and well-nourished.  HENT:  Head: Normocephalic.  Eyes: Pupils are equal, round, and reactive to light.  Cardiovascular: Normal rate and regular rhythm.   Pulmonary/Chest: Effort normal.  Abdominal: Soft. There is no tenderness.  Large hernia  Reduces easily  Musculoskeletal: Normal range of motion.  t and ls spine nontender  Neurological: He is alert.  Skin: Skin is warm.  Psychiatric: He has a normal mood and affect.  Nursing note and vitals reviewed.   ED Course  Procedures (including critical care time) Labs Review Labs Reviewed - No data to display  Imaging Review Dg Chest 2 View  05/27/2014   CLINICAL DATA:  Right-sided lower rib pain following fall  EXAM: CHEST  2 VIEW  COMPARISON:  September 18, 2013   FINDINGS: There is an apparent nipple shadow overlying the right base. There is no edema or consolidation. The heart size and pulmonary vascularity are normal. There is atherosclerotic change in the aorta. No adenopathy. No pneumothorax. There is degenerative change in the thoracic spine. No demonstrable rib fracture.  IMPRESSION: No edema or consolidation. No pneumothorax. No rib fracture apparent. If there remains concern for potential rib trauma, detailed rib series may be reasonable to further assess.   Electronically Signed   By: Lowella Grip M.D.   On: 05/27/2014 12:18     MDM  Chest xray shows no rib fracture.   Pt's impact was n back.   He has some right uper abdomen sorness.  (Pt reports he chronically is sore in that area and has had evaluations for )  Pt declines medication for pain.   I advised tylenol.   Pt advised if pain worsen or changes he shoulder go to ED for further evalution   1. Contusion, chest wall, right, initial encounter   2. Fall   3. Pain    VS Tylenol    Fransico Meadow, PA-C 05/27/14 1249

## 2014-05-27 NOTE — Discharge Instructions (Signed)
Contusion °A contusion is a deep bruise. Contusions are the result of an injury that caused bleeding under the skin. The contusion may turn blue, purple, or yellow. Minor injuries will give you a painless contusion, but more severe contusions may stay painful and swollen for a few weeks.  °CAUSES  °A contusion is usually caused by a blow, trauma, or direct force to an area of the body. °SYMPTOMS  °· Swelling and redness of the injured area. °· Bruising of the injured area. °· Tenderness and soreness of the injured area. °· Pain. °DIAGNOSIS  °The diagnosis can be made by taking a history and physical exam. An X-ray, CT scan, or MRI may be needed to determine if there were any associated injuries, such as fractures. °TREATMENT  °Specific treatment will depend on what area of the body was injured. In general, the best treatment for a contusion is resting, icing, elevating, and applying cold compresses to the injured area. Over-the-counter medicines may also be recommended for pain control. Ask your caregiver what the best treatment is for your contusion. °HOME CARE INSTRUCTIONS  °· Put ice on the injured area. °¨ Put ice in a plastic bag. °¨ Place a towel between your skin and the bag. °¨ Leave the ice on for 15-20 minutes, 3-4 times a day, or as directed by your health care provider. °· Only take over-the-counter or prescription medicines for pain, discomfort, or fever as directed by your caregiver. Your caregiver may recommend avoiding anti-inflammatory medicines (aspirin, ibuprofen, and naproxen) for 48 hours because these medicines may increase bruising. °· Rest the injured area. °· If possible, elevate the injured area to reduce swelling. °SEEK IMMEDIATE MEDICAL CARE IF:  °· You have increased bruising or swelling. °· You have pain that is getting worse. °· Your swelling or pain is not relieved with medicines. °MAKE SURE YOU:  °· Understand these instructions. °· Will watch your condition. °· Will get help right  away if you are not doing well or get worse. °Document Released: 03/07/2005 Document Revised: 06/02/2013 Document Reviewed: 04/02/2011 °[employer]® Patient Information ©2015 [employer]. This information is not intended to replace advice given to you by your health care provider. Make sure you discuss any questions you have with your health care provider. ° °

## 2014-05-28 ENCOUNTER — Telehealth: Payer: Self-pay | Admitting: Emergency Medicine

## 2014-05-28 NOTE — ED Notes (Signed)
Inquired about patient's status; encourage them to call with questions/concerns.  

## 2015-05-19 ENCOUNTER — Encounter: Payer: Self-pay | Admitting: *Deleted

## 2015-05-19 ENCOUNTER — Emergency Department (INDEPENDENT_AMBULATORY_CARE_PROVIDER_SITE_OTHER): Payer: Medicare Other

## 2015-05-19 ENCOUNTER — Emergency Department (INDEPENDENT_AMBULATORY_CARE_PROVIDER_SITE_OTHER)
Admission: EM | Admit: 2015-05-19 | Discharge: 2015-05-19 | Disposition: A | Discharge status: Home / Self Care | Payer: Medicare Other | Source: Home / Self Care | Attending: Family Medicine | Admitting: Family Medicine

## 2015-05-19 DIAGNOSIS — Z9889 Other specified postprocedural states: Secondary | ICD-10-CM | POA: Diagnosis not present

## 2015-05-19 DIAGNOSIS — M545 Low back pain, unspecified: Secondary | ICD-10-CM

## 2015-05-19 DIAGNOSIS — G8929 Other chronic pain: Secondary | ICD-10-CM | POA: Diagnosis not present

## 2015-05-19 MED ORDER — TRAMADOL HCL 50 MG PO TABS: 50.0000 mg | ORAL_TABLET | Freq: Four times a day (QID) | ORAL | Status: DC | PRN

## 2015-05-19 MED ORDER — IBUPROFEN 600 MG PO TABS
600.0000 mg | ORAL_TABLET | Freq: Once | ORAL | Status: AC
  Administered 2015-05-19: 600 mg via ORAL

## 2015-05-19 NOTE — Discharge Instructions (Signed)
Tramadol is strong pain medication. While taking, do not drink alcohol, drive, or perform any other activities that requires focus while taking these medications.    Back Exercises If you have pain in your back, do these exercises 2-3 times each day or as told by your doctor. When the pain goes away, do the exercises once each day, but repeat the steps more times for each exercise (do more repetitions). If you do not have pain in your back, do these exercises once each day or as told by your doctor. EXERCISES Single Knee to Chest Do these steps 3-5 times in a row for each leg:  Lie on your back on a firm bed or the floor with your legs stretched out.  Bring one knee to your chest.  Hold your knee to your chest by grabbing your knee or thigh.  Pull on your knee until you feel a gentle stretch in your lower back.  Keep doing the stretch for 10-30 seconds.  Slowly let go of your leg and straighten it. Pelvic Tilt Do these steps 5-10 times in a row:  Lie on your back on a firm bed or the floor with your legs stretched out.  Bend your knees so they point up to the ceiling. Your feet should be flat on the floor.  Tighten your lower belly (abdomen) muscles to press your lower back against the floor. This will make your tailbone point up to the ceiling instead of pointing down to your feet or the floor.  Stay in this position for 5-10 seconds while you gently tighten your muscles and breathe evenly. Cat-Cow Do these steps until your lower back bends more easily:  Get on your hands and knees on a firm surface. Keep your hands under your shoulders, and keep your knees under your hips. You may put padding under your knees.  Let your head hang down, and make your tailbone point down to the floor so your lower back is round like the back of a cat.  Stay in this position for 5 seconds.  Slowly lift your head and make your tailbone point up to the ceiling so your back hangs low (sags) like  the back of a cow.  Stay in this position for 5 seconds. Press-Ups Do these steps 5-10 times in a row:  Lie on your belly (face-down) on the floor.  Place your hands near your head, about shoulder-width apart.  While you keep your back relaxed and keep your hips on the floor, slowly straighten your arms to raise the top half of your body and lift your shoulders. Do not use your back muscles. To make yourself more comfortable, you may change where you place your hands.  Stay in this position for 5 seconds.  Slowly return to lying flat on the floor. Bridges Do these steps 10 times in a row:  Lie on your back on a firm surface.  Bend your knees so they point up to the ceiling. Your feet should be flat on the floor.  Tighten your butt muscles and lift your butt off of the floor until your waist is almost as high as your knees. If you do not feel the muscles working in your butt and the back of your thighs, slide your feet 1-2 inches farther away from your butt.  Stay in this position for 3-5 seconds.  Slowly lower your butt to the floor, and let your butt muscles relax. If this exercise is too easy, try doing  it with your arms crossed over your chest. Belly Crunches Do these steps 5-10 times in a row:  Lie on your back on a firm bed or the floor with your legs stretched out.  Bend your knees so they point up to the ceiling. Your feet should be flat on the floor.  Cross your arms over your chest.  Tip your chin a little bit toward your chest but do not bend your neck.  Tighten your belly muscles and slowly raise your chest just enough to lift your shoulder blades a tiny bit off of the floor.  Slowly lower your chest and your head to the floor. Back Lifts Do these steps 5-10 times in a row: 1. Lie on your belly (face-down) with your arms at your sides, and rest your forehead on the floor. 2. Tighten the muscles in your legs and your butt. 3. Slowly lift your chest off of the  floor while you keep your hips on the floor. Keep the back of your head in line with the curve in your back. Look at the floor while you do this. 4. Stay in this position for 3-5 seconds. 5. Slowly lower your chest and your face to the floor. GET HELP IF:  Your back pain gets a lot worse when you do an exercise.  Your back pain does not lessen 2 hours after you exercise. If you have any of these problems, stop doing the exercises. Do not do them again unless your doctor says it is okay. GET HELP RIGHT AWAY IF:  You have sudden, very bad back pain. If this happens, stop doing the exercises. Do not do them again unless your doctor says it is okay.   This information is not intended to replace advice given to you by your health care provider. Make sure you discuss any questions you have with your health care provider.   Document Released: 06/30/2010 Document Revised: 02/16/2015 Document Reviewed: 07/22/2014 Elsevier Interactive Patient Education 2016 [place] Injury Prevention Back injuries can be very painful. They can also be difficult to heal. After having one back injury, you are more likely to injure your back again. It is important to learn how to avoid injuring or re-injuring your back. The following tips can help you to prevent a back injury. WHAT SHOULD I KNOW ABOUT PHYSICAL FITNESS?  Exercise for 30 minutes per day on most days of the week or as told by your doctor. Make sure to:  Do aerobic exercises, such as walking, jogging, biking, or swimming.  Do exercises that increase balance and strength, such as tai chi and yoga.  Do stretching exercises. This helps with flexibility.  Try to develop strong belly (abdominal) muscles. Your belly muscles help to support your back.  Stay at a healthy weight. This helps to decrease your risk of a back injury. WHAT SHOULD I KNOW ABOUT MY DIET?  Talk with your doctor about your overall diet. Take supplements and vitamins only  as told by your doctor.  Talk with your doctor about how much calcium and vitamin D you need each day. These nutrients help to prevent weakening of the bones (osteoporosis).  Include good sources of calcium in your diet, such as:  Dairy products.  Green leafy vegetables.  Products that have had calcium added to them (fortified).  Include good sources of vitamin D in your diet, such as:  Milk.  Foods that have had vitamin D added to them. WHAT SHOULD I KNOW  ABOUT MY POSTURE?  Sit up straight and stand up straight. Avoid leaning forward when you sit or hunching over when you stand.  Choose chairs that have good low-back (lumbar) support.  If you work at a desk, sit close to it so you do not need to lean over. Keep your chin tucked in. Keep your neck drawn back. Keep your elbows bent so your arms look like the letter "L" (right angle).  Sit high and close to the steering wheel when you drive. Add a low-back support to your car seat, if needed.  Avoid sitting or standing in one position for very long. Take breaks to get up, stretch, and walk around at least one time every hour. Take breaks every hour if you are driving for long periods of time.  Sleep on your side with your knees slightly bent, or sleep on your back with a pillow under your knees. Do not lie on the front of your body to sleep. WHAT SHOULD I KNOW ABOUT LIFTING, TWISTING, AND REACHING Lifting and Heavy Lifting  Avoid heavy lifting, especially lifting over and over again. If you must do heavy lifting:  Stretch before lifting.  Work slowly.  Rest between lifts.  Use a tool such as a cart or a dolly to move objects if one is available.  Make several small trips instead of carrying one heavy load.  Ask for help when you need it, especially when moving big objects.  Follow these steps when lifting:  Stand with your feet shoulder-width apart.  Get as close to the object as you can. Do not pick up a heavy  object that is far from your body.  Use handles or lifting straps if they are available.  Bend at your knees. Squat down, but keep your heels off the floor.  Keep your shoulders back. Keep your chin tucked in. Keep your back straight.  Lift the object slowly while you tighten the muscles in your legs, belly, and butt. Keep the object as close to the center of your body as possible.  Follow these steps when putting down a heavy load:  Stand with your feet shoulder-width apart.  Lower the object slowly while you tighten the muscles in your legs, belly, and butt. Keep the object as close to the center of your body as possible.  Keep your shoulders back. Keep your chin tucked in. Keep your back straight.  Bend at your knees. Squat down, but keep your heels off the floor.  Use handles or lifting straps if they are available. Twisting and Reaching  Avoid lifting heavy objects above your waist.  Do not twist at your waist while you are lifting or carrying a load. If you need to turn, move your feet.  Do not bend over without bending at your knees.  Avoid reaching over your head, across a table, or for an object on a high surface.  WHAT ARE SOME OTHER TIPS?  Avoid wet floors and icy ground. Keep sidewalks clear of ice to prevent falls.   Do not sleep on a mattress that is too soft or too hard.   Keep items that you use often within easy reach.   Put heavier objects on shelves at waist level, and put lighter objects on lower or higher shelves.  Find ways to lower your stress, such as:  Exercise.  Massage.  Relaxation techniques.  Talk with your doctor if you feel anxious or depressed. These conditions can make back pain worse.  Wear flat heel shoes with cushioned soles.  Avoid making quick (sudden) movements.  Use both shoulder straps when carrying a backpack.  Do not use any tobacco products, including cigarettes, chewing tobacco, or electronic cigarettes. If you  need help quitting, ask your doctor.   This information is not intended to replace advice given to you by your health care provider. Make sure you discuss any questions you have with your health care provider.   Document Released: 11/14/2007 Document Revised: 10/12/2014 Document Reviewed: 06/01/2014 Elsevier Interactive Patient Education [employer].

## 2015-05-19 NOTE — ED Provider Notes (Signed)
CSN: [id number]     Arrival date & time 05/19/15  1027 History   First MD Initiated Contact with Patient 05/19/15 1032     Chief Complaint  Patient presents with  . Back Pain   (Consider location/radiation/quality/duration/timing/severity/associated sxs/prior Treatment) HPI Pt is an 81yo male with hx of lumbar discectomy of L5 in 1970s, presenting to [facility] with c/o lower back pain that has been intermittently worsening over the last 1-2 weeks. Pt reports being seen at [facility] for similar pain.  This morning pain caused him to fall do a sharp stabbing pain in his Right lower back. It felt like a "catch."  Pain is 8/10 at worst, worse with certain positions. He takes '81mg'$  aspirin daily.  Denies any other recent falls.  Pt states last time he was here imaging was done but everything looked "okay." he has not f/u with an orthopedist about his back since the initial surgery. Denies urinary symptoms, fever or chills.  Pt states his urine has been checked in the past and it is always "normal."  Denies fever, chills, n/v/d. No change in bowel or bladder habits. Denies pain or numbness radiating into his legs but does report mild intermittent Right foot numbness that improves with walking around.    Past Medical History  Diagnosis Date  . Hernia   . Diabetes mellitus   . Cyst of scrotum   . Hyperlipidemia    Past Surgical History  Procedure Laterality Date  . Back surgery    . Cholecystectomy     Family History  Problem Relation Age of Onset  . Cancer Brother     colon CA   Social History  Substance Use Topics  . Smoking status: Former Smoker    Quit date: 05/27/2004  . Smokeless tobacco: None  . Alcohol Use: No    Review of Systems  Constitutional: Negative for fever and chills.  Gastrointestinal: Negative for nausea, vomiting, abdominal pain, diarrhea and constipation.  Genitourinary: Negative for dysuria, urgency, frequency and hematuria.  Musculoskeletal: Positive for myalgias and back  pain (Lower, and right lower). Negative for joint swelling, gait problem, neck pain and neck stiffness.  Skin: Negative for color change and rash.  Neurological: Positive for numbness (Right foot). Negative for weakness.    Allergies  Review of patient's allergies indicates no known allergies.  Home Medications   Prior to Admission medications   Medication Sig Start Date End Date Taking? Authorizing Provider  aspirin 81 MG tablet Take 81 mg by mouth daily.   Yes Historical Provider, MD  Carboxymethylcellulose Sodium 0.25 % SOLN Place 1 drop into both eyes daily.    Yes Historical Provider, MD  glipiZIDE (GLUCOTROL) 5 MG tablet Take 2.5 mg by mouth 2 (two) times daily before a meal.   Yes Historical Provider, MD  lisinopril (PRINIVIL,ZESTRIL) 10 MG tablet Take 5 mg by mouth daily.   Yes Historical Provider, MD  pantoprazole (PROTONIX) 40 MG tablet Take 40 mg by mouth daily.    Yes Historical Provider, MD  simvastatin (ZOCOR) 80 MG tablet Take 40 mg by mouth at bedtime.    Yes Historical Provider, MD  terazosin (HYTRIN) 10 MG capsule Take 10 mg by mouth at bedtime.   Yes Historical Provider, MD  Cholecalciferol (VITAMIN D) 2000 UNITS CAPS Take 1 capsule by mouth daily.     Historical Provider, MD  loratadine (CLARITIN) 10 MG tablet Take 10 mg by mouth daily.    Historical Provider, MD  traMADol (ULTRAM) 50 MG  tablet Take 1 tablet (50 mg total) by mouth every 6 (six) hours as needed. 05/19/15   Noland Fordyce, PA-C  zolpidem (AMBIEN) 10 MG tablet Take 5 mg by mouth at bedtime as needed for sleep. For sleep    Historical Provider, MD   Meds Ordered and Administered this Visit   Medications  ibuprofen (ADVIL,MOTRIN) tablet 600 mg (600 mg Oral Given 05/19/15 1100)    BP 109/64 mmHg  Pulse 76  Resp 16  Wt 202 lb (91.627 kg)  SpO2 95% No data found.   Physical Exam  Constitutional: He is oriented to person, place, and time. He appears well-developed and well-nourished.  HENT:  Head:  Normocephalic and atraumatic.  Eyes: Conjunctivae are normal. No scleral icterus.  Neck: Normal range of motion. Neck supple.  Cardiovascular: Normal rate, regular rhythm, normal heart sounds and intact distal pulses.   Pulmonary/Chest: Effort normal. No respiratory distress.  Abdominal: Soft. He exhibits no distension. There is no tenderness.  Musculoskeletal: Normal range of motion. He exhibits tenderness. He exhibits no edema.  Tenderness to lower lumbar spine and Right lumbar paraspinal muscles. Full ROM upper and lower extremities with 5/5 strength bilaterally   Neurological: He is alert and oriented to person, place, and time.  Reflex Scores:      Patellar reflexes are 2+ on the right side and 2+ on the left side. Normal gait. Normal sensation in upper and lower extremities bilaterally  Skin: Skin is warm and dry. No rash noted. No erythema.  Nursing note and vitals reviewed.   ED Course  Procedures (including critical care time)  Labs Review Labs Reviewed - No data to display  Imaging Review Dg Lumbar Spine Complete  05/19/2015  CLINICAL DATA:  Low back pain.  Fall today.  Back surgery EXAM: LUMBAR SPINE - COMPLETE 4+ VIEW COMPARISON:  09/18/2013 FINDINGS: Pedicle screw and interbody fusion L4-5 in satisfactory position and unchanged. Mild disc degeneration and spurring L5-S1. Remaining disc spaces intact. Negative for fracture or bone lesion.  SI joints intact. IMPRESSION: Satisfactory fusion L4-5. No acute abnormality and no change from the prior study. Electronically Signed   By: Franchot Gallo M.D.   On: 05/19/2015 11:20      MDM   1. Acute exacerbation of chronic low back pain   2. History of lumbar surgery     Pt c/o exacerbation of lower back pain. No urinary symptoms. Pt appears well, non-toxic. Is afebrile. Pt reports being seen 5-6 months ago, last imaging and notes in EPIC seen from 1 year ago.  Will get repeat plan films.  No red flag symptoms. Doubt emergent  process taking place at this time.   Plain films: no acute abnormality.  Reassured pt of plain films, but recommended f/u with Dr. Georgina Snell, Sports Medicine, or with a previously established orthopedist for further imaging and treatment.  Rx: tramadol.  May also take acetaminophen. Patient verbalized understanding and agreement with treatment plan.     Noland Fordyce, PA-C 05/19/15 1222

## 2015-05-19 NOTE — ED Notes (Signed)
Pt c/o 1-2 weeks of low back pain, worse today. [place] this AM when he felt a "catch" in his low back.

## 2015-05-24 ENCOUNTER — Emergency Department (HOSPITAL_COMMUNITY)
Admission: EM | Admit: 2015-05-24 | Discharge: 2015-05-24 | Disposition: A | Discharge status: Home / Self Care | Payer: Medicare Other | Attending: Emergency Medicine | Admitting: Emergency Medicine

## 2015-05-24 ENCOUNTER — Encounter (HOSPITAL_COMMUNITY): Payer: Self-pay | Admitting: Emergency Medicine

## 2015-05-24 ENCOUNTER — Emergency Department (HOSPITAL_COMMUNITY): Payer: Medicare Other

## 2015-05-24 DIAGNOSIS — Z7982 Long term (current) use of aspirin: Secondary | ICD-10-CM | POA: Insufficient documentation

## 2015-05-24 DIAGNOSIS — Y9389 Activity, other specified: Secondary | ICD-10-CM | POA: Insufficient documentation

## 2015-05-24 DIAGNOSIS — Y998 Other external cause status: Secondary | ICD-10-CM | POA: Insufficient documentation

## 2015-05-24 DIAGNOSIS — E119 Type 2 diabetes mellitus without complications: Secondary | ICD-10-CM | POA: Insufficient documentation

## 2015-05-24 DIAGNOSIS — Z79899 Other long term (current) drug therapy: Secondary | ICD-10-CM | POA: Diagnosis not present

## 2015-05-24 DIAGNOSIS — Y9289 Other specified places as the place of occurrence of the external cause: Secondary | ICD-10-CM | POA: Insufficient documentation

## 2015-05-24 DIAGNOSIS — W1839XA Other fall on same level, initial encounter: Secondary | ICD-10-CM | POA: Insufficient documentation

## 2015-05-24 DIAGNOSIS — Z87891 Personal history of nicotine dependence: Secondary | ICD-10-CM | POA: Insufficient documentation

## 2015-05-24 DIAGNOSIS — S3992XA Unspecified injury of lower back, initial encounter: Secondary | ICD-10-CM | POA: Insufficient documentation

## 2015-05-24 DIAGNOSIS — E785 Hyperlipidemia, unspecified: Secondary | ICD-10-CM | POA: Insufficient documentation

## 2015-05-24 DIAGNOSIS — W19XXXA Unspecified fall, initial encounter: Secondary | ICD-10-CM

## 2015-05-24 MED ORDER — HYDROCODONE-ACETAMINOPHEN 5-325 MG PO TABS
1.0000 | ORAL_TABLET | Freq: Once | ORAL | Status: AC
  Administered 2015-05-24: 1 via ORAL
  Filled 2015-05-24: qty 1

## 2015-05-24 MED ORDER — HYDROCODONE-ACETAMINOPHEN 5-325 MG PO TABS
2.0000 | ORAL_TABLET | Freq: Once | ORAL | Status: DC
Start: 2015-05-24 — End: 2015-05-24

## 2015-05-24 MED ORDER — TRAMADOL HCL 50 MG PO TABS: 50.0000 mg | ORAL_TABLET | Freq: Four times a day (QID) | ORAL | Status: DC | PRN

## 2015-05-24 NOTE — Discharge Instructions (Signed)
You were evaluated in the ED today for your back pain after fall. There does not appear to be an emergent cause for your symptoms at this time. He likely will have some muscle pain and bruising. You may take your pain medicine for moderate to severe pain. He may take Tylenol or Motrin for mild to moderate pain. Follow-up with your doctor in the next week or so for reevaluation as needed. Return to ED for any new or worsening symptoms.

## 2015-05-24 NOTE — ED Notes (Signed)
Patient went to get this eye exam and had them dilated. Patient was walking a trip over a step. Denies any LOC, patient denies any Blood thinners. States he fell on his back. Patient does complain of flank pain. Patient states pain with ROM. Patient has limited ROM due to Pain. Patient Alert and Oriented on arrival. Patient denies any pain without movement. Patient has Hx of back pain and surgrey.

## 2015-05-24 NOTE — ED Provider Notes (Signed)
CSN: [id number]     Arrival date & time 05/24/15  1252 History   First MD Initiated Contact with Patient 05/24/15 1302     Chief Complaint  Patient presents with  . Fall  . Back Pain     (Consider location/radiation/quality/duration/timing/severity/associated sxs/prior Treatment) HPI [name] is a 81 y.o. male with a history of diabetes, hyperlipidemia, comes in for evaluation of back pain after a fall. Patient reports he was at the eye doctor today and had his eyes dilated. He reports after this procedure he felt more dizzy and tripped over a step walking into his son's house. He reports falling backwards onto his low back. No LOC. He denies any numbness or weakness. He reports very mild discomfort at rest, worse with certain movements. Has not taken anything to improve symptoms. No other modifying factors.  Past Medical History  Diagnosis Date  . Hernia   . Diabetes mellitus   . Cyst of scrotum   . Hyperlipidemia    Past Surgical History  Procedure Laterality Date  . Back surgery    . Cholecystectomy     Family History  Problem Relation Age of Onset  . Cancer Brother     colon CA   Social History  Substance Use Topics  . Smoking status: Former Smoker    Quit date: 05/27/2004  . Smokeless tobacco: None  . Alcohol Use: No    Review of Systems A 10 point review of systems was completed and was negative except for pertinent positives and negatives as mentioned in the history of present illness     Allergies  Review of patient's allergies indicates no known allergies.  Home Medications   Prior to Admission medications   Medication Sig Start Date End Date Taking? Authorizing Provider  aspirin 81 MG tablet Take 81 mg by mouth daily.   Yes Historical Provider, MD  Carboxymethylcellulose Sodium 0.25 % SOLN Place 1 drop into both eyes daily.    Yes Historical Provider, MD  Cholecalciferol (VITAMIN D) 2000 UNITS CAPS Take 1 capsule by mouth daily.    Yes Historical  Provider, MD  glipiZIDE (GLUCOTROL) 5 MG tablet Take 2.5 mg by mouth 2 (two) times daily before a meal.   Yes Historical Provider, MD  lisinopril (PRINIVIL,ZESTRIL) 10 MG tablet Take 5 mg by mouth daily.   Yes Historical Provider, MD  loratadine (CLARITIN) 10 MG tablet Take 10 mg by mouth daily.   Yes Historical Provider, MD  meclizine (ANTIVERT) 25 MG tablet Take 25 mg by mouth daily as needed for dizziness.   Yes Historical Provider, MD  OVER THE COUNTER MEDICATION Take 1 capsule by mouth daily. Colon helper. " [facility]"   Yes Historical Provider, MD  OVER THE COUNTER MEDICATION Take 1 capsule by mouth daily. Ultimate colon care formula.   Yes Historical Provider, MD  pantoprazole (PROTONIX) 40 MG tablet Take 40 mg by mouth 2 (two) times daily.    Yes Historical Provider, MD  simvastatin (ZOCOR) 80 MG tablet Take 40 mg by mouth at bedtime.    Yes Historical Provider, MD  traMADol (ULTRAM) 50 MG tablet Take 1 tablet (50 mg total) by mouth every 6 (six) hours as needed. 05/24/15   Kyliyah Stirn, PA-C   BP 155/73 mmHg  Pulse 80  Temp(Src) 97.7 F (36.5 C) (Oral)  Resp 16  SpO2 94% Physical Exam  Constitutional: He is oriented to person, place, and time. He appears well-developed and well-nourished.  Well-appearing  Caucasian male  HENT:  Head: Normocephalic and atraumatic.  Mouth/Throat: Oropharynx is clear and moist.  Eyes: Conjunctivae are normal. Pupils are equal, round, and reactive to light. Right eye exhibits no discharge. Left eye exhibits no discharge. No scleral icterus.  Neck: Neck supple.  Cardiovascular: Normal rate, regular rhythm and normal heart sounds.   Pulmonary/Chest: Effort normal and breath sounds normal. No respiratory distress. He has no wheezes. He has no rales.  Abdominal: Soft. There is no tenderness.  Musculoskeletal: He exhibits no tenderness.  Diffuse tenderness throughout lumbar region with no focal midline bony tenderness. No other  lesions or deformities. Full active range of motion of bilateral lower extremities.  Neurological: He is alert and oriented to person, place, and time.  Cranial Nerves II-XII grossly intact. Motor strength is intact and 5/5 bilaterally in lower legs. Sensation intact to light touch. Moves all extremities without ataxia.  Skin: Skin is warm and dry. No rash noted.  Psychiatric: He has a normal mood and affect.  Nursing note and vitals reviewed.   ED Course  Procedures (including critical care time) Labs Review Labs Reviewed - No data to display  Imaging Review No results found. I have personally reviewed and evaluated these images and lab results as part of my medical decision-making.   EKG Interpretation None     Meds given in ED:  Medications  HYDROcodone-acetaminophen (NORCO/VICODIN) 5-325 MG per tablet 1 tablet (1 tablet Oral Given 05/24/15 1400)    Discharge Medication List as of 05/24/2015  4:00 PM     Filed Vitals:   05/24/15 1515 05/24/15 1520 05/24/15 1600 05/24/15 1602  BP:   155/73 155/73  Pulse: 81 91 86 80  Temp:    97.7 F (36.5 C)  TempSrc:    Oral  Resp:    16  SpO2: 93% 92% 93% 94%    MDM  [name] is a 81 y.o. male who comes in for evaluation of back pain after a fall. Patient was at the eye doctor today, had his eyes dilated and experienced some mild dizziness and fell walking up the steps. Patient fell backwards on his back, no LOC or head trauma. No numbness or weakness. On arrival, he is hemodynamically stable with normal vital signs and is afebrile. On exam he is diffusely tender throughout his lumbar region with no focal bony tenderness or crepitus. No other lesions. He remains neurovascularly intact. Plain films of lumbar spine show no acute fractures or bony abnormalities. Surgical hardware is in place. Patient will be discharged home with short course pain medicines-precautions given for pain medicines. Instructed to follow-up with his PCP  next week for further evaluation and management of symptoms. Return precautions to the ED given including fevers, numbness or weakness, loss of bowel or bladder function. Patient verbalizes understanding and agrees with this plan. Voices no other questions or concerns at this time. He is appropriate for discharge. The patient appears reasonably screened and/or stabilized for discharge and I doubt any other medical condition or other [facility] requiring further screening, evaluation, or treatment in the ED at this time prior to discharge.   Final diagnoses:  Fall, initial encounter        Comer Locket, PA-C 05/28/15 1613  Quintella Reichert, MD 05/30/15 [phone]

## 2015-07-28 ENCOUNTER — Emergency Department (HOSPITAL_COMMUNITY): Payer: Medicare Other

## 2015-07-28 ENCOUNTER — Encounter (HOSPITAL_COMMUNITY): Payer: Self-pay | Admitting: Emergency Medicine

## 2015-07-28 ENCOUNTER — Emergency Department (HOSPITAL_COMMUNITY)
Admission: EM | Admit: 2015-07-28 | Discharge: 2015-07-28 | Disposition: A | Discharge status: Home / Self Care | Payer: Medicare Other | Attending: Emergency Medicine | Admitting: Emergency Medicine

## 2015-07-28 DIAGNOSIS — Z87891 Personal history of nicotine dependence: Secondary | ICD-10-CM | POA: Insufficient documentation

## 2015-07-28 DIAGNOSIS — Z7982 Long term (current) use of aspirin: Secondary | ICD-10-CM | POA: Insufficient documentation

## 2015-07-28 DIAGNOSIS — J209 Acute bronchitis, unspecified: Secondary | ICD-10-CM | POA: Diagnosis not present

## 2015-07-28 DIAGNOSIS — E119 Type 2 diabetes mellitus without complications: Secondary | ICD-10-CM | POA: Diagnosis not present

## 2015-07-28 DIAGNOSIS — R05 Cough: Secondary | ICD-10-CM | POA: Diagnosis present

## 2015-07-28 DIAGNOSIS — E785 Hyperlipidemia, unspecified: Secondary | ICD-10-CM | POA: Insufficient documentation

## 2015-07-28 DIAGNOSIS — Z7984 Long term (current) use of oral hypoglycemic drugs: Secondary | ICD-10-CM | POA: Insufficient documentation

## 2015-07-28 DIAGNOSIS — J40 Bronchitis, not specified as acute or chronic: Secondary | ICD-10-CM

## 2015-07-28 DIAGNOSIS — Z872 Personal history of diseases of the skin and subcutaneous tissue: Secondary | ICD-10-CM | POA: Insufficient documentation

## 2015-07-28 DIAGNOSIS — Z8719 Personal history of other diseases of the digestive system: Secondary | ICD-10-CM | POA: Diagnosis not present

## 2015-07-28 DIAGNOSIS — Z79899 Other long term (current) drug therapy: Secondary | ICD-10-CM | POA: Insufficient documentation

## 2015-07-28 LAB — COMPREHENSIVE METABOLIC PANEL
ALT: 26 U/L (ref 17–63)
AST: 38 U/L (ref 15–41)
Albumin: 3.4 g/dL — ABNORMAL LOW (ref 3.5–5.0)
Alkaline Phosphatase: 66 U/L (ref 38–126)
Anion gap: 8 (ref 5–15)
BUN: 8 mg/dL (ref 6–20)
CO2: 26 mmol/L (ref 22–32)
Calcium: 8.1 mg/dL — ABNORMAL LOW (ref 8.9–10.3)
Chloride: 99 mmol/L — ABNORMAL LOW (ref 101–111)
Creatinine, Ser: 1 mg/dL (ref 0.61–1.24)
GFR calc Af Amer: >60 mL/min (ref >60)
GFR calc non Af Amer: >60 mL/min (ref >60)
Glucose, Bld: 121 mg/dL — ABNORMAL HIGH (ref 65–99)
Potassium: 3.8 mmol/L (ref 3.5–5.1)
Sodium: 133 mmol/L — ABNORMAL LOW (ref 135–145)
Total Bilirubin: 0.7 mg/dL (ref 0.3–1.2)
Total Protein: 6.5 g/dL (ref 6.5–8.1)

## 2015-07-28 LAB — CBC WITH DIFFERENTIAL/PLATELET
Basophils Absolute: 0 10*3/uL (ref 0.0–0.1)
Basophils Relative: 0 %
Eosinophils Absolute: 0.1 10*3/uL (ref 0.0–0.7)
Eosinophils Relative: 1 %
HCT: 38.7 % — ABNORMAL LOW (ref 39.0–52.0)
Hemoglobin: 12.8 g/dL — ABNORMAL LOW (ref 13.0–17.0)
Lymphocytes Relative: 21 %
Lymphs Abs: 1.5 10*3/uL (ref 0.7–4.0)
MCH: 29.4 pg (ref 26.0–34.0)
MCHC: 33.1 g/dL (ref 30.0–36.0)
MCV: 89 fL (ref 78.0–100.0)
Monocytes Absolute: 1.2 10*3/uL — ABNORMAL HIGH (ref 0.1–1.0)
Monocytes Relative: 17 %
Neutro Abs: 4.3 10*3/uL (ref 1.7–7.7)
Neutrophils Relative %: 61 %
Platelets: 212 10*3/uL (ref 150–400)
RBC: 4.35 MIL/uL (ref 4.22–5.81)
RDW: 13.3 % (ref 11.5–15.5)
WBC: 7 10*3/uL (ref 4.0–10.5)

## 2015-07-28 MED ORDER — ALBUTEROL SULFATE HFA 108 (90 BASE) MCG/ACT IN AERS: 1.0000 | INHALATION_SPRAY | Freq: Four times a day (QID) | RESPIRATORY_TRACT | Status: DC | PRN

## 2015-07-28 MED ORDER — AZITHROMYCIN 250 MG PO TABS: 250.0000 mg | ORAL_TABLET | Freq: Every day | ORAL | Status: DC

## 2015-07-28 MED ORDER — IPRATROPIUM-ALBUTEROL 0.5-2.5 (3) MG/3ML IN SOLN
3.0000 mL | Freq: Once | RESPIRATORY_TRACT | Status: AC
  Administered 2015-07-28: 3 mL via RESPIRATORY_TRACT
  Filled 2015-07-28: qty 3

## 2015-07-28 NOTE — ED Provider Notes (Signed)
CSN: [id number]     Arrival date & time 07/28/15  1334 History   First MD Initiated Contact with Patient 07/28/15 1523     Chief Complaint  Patient presents with  . Cough     (Consider location/radiation/quality/duration/timing/severity/associated sxs/prior Treatment) Patient is a 82 y.o. male presenting with cough. The history is provided by the patient. No language interpreter was used.  Cough Cough characteristics:  Productive Severity:  Moderate Timing:  Constant Progression:  Worsening Chronicity:  New Smoker: no   Context: not sick contacts   Relieved by:  Nothing Worsened by:  Nothing tried Ineffective treatments:  None tried Associated symptoms: no fever     Past Medical History  Diagnosis Date  . Hernia   . Diabetes mellitus   . Cyst of scrotum   . Hyperlipidemia    Past Surgical History  Procedure Laterality Date  . Back surgery    . Cholecystectomy     Family History  Problem Relation Age of Onset  . Cancer Brother     colon CA   Social History  Substance Use Topics  . Smoking status: Former Smoker    Quit date: 05/27/2004  . Smokeless tobacco: None  . Alcohol Use: No    Review of Systems  Constitutional: Negative for fever.  Respiratory: Positive for cough.   All other systems reviewed and are negative.     Allergies  Review of patient's allergies indicates no known allergies.  Home Medications   Prior to Admission medications   Medication Sig Start Date End Date Taking? Authorizing Provider  aspirin EC 81 MG tablet Take 81 mg by mouth daily.   Yes Historical Provider, MD  Carboxymethylcellulose Sodium 0.25 % SOLN Place 1 drop into both eyes daily.    Yes Historical Provider, MD  Cholecalciferol (VITAMIN D) 2000 UNITS CAPS Take 1 capsule by mouth daily.    Yes Historical Provider, MD  clonazePAM (KLONOPIN) 1 MG tablet Take 1 mg by mouth at bedtime.   Yes Historical Provider, MD  glipiZIDE (GLUCOTROL) 5 MG tablet Take 2.5 mg by mouth 2  (two) times daily before a meal.   Yes Historical Provider, MD  lisinopril (PRINIVIL,ZESTRIL) 10 MG tablet Take 5 mg by mouth daily.   Yes Historical Provider, MD  loratadine (CLARITIN) 10 MG tablet Take 10 mg by mouth daily.   Yes Historical Provider, MD  pantoprazole (PROTONIX) 40 MG tablet Take 40 mg by mouth 2 (two) times daily.    Yes Historical Provider, MD  simvastatin (ZOCOR) 80 MG tablet Take 40 mg by mouth at bedtime.    Yes Historical Provider, MD  terazosin (HYTRIN) 5 MG capsule Take 5 mg by mouth at bedtime.   Yes Historical Provider, MD  albuterol (PROVENTIL HFA;VENTOLIN HFA) 108 (90 Base) MCG/ACT inhaler Inhale 1-2 puffs into the lungs every 6 (six) hours as needed for wheezing or shortness of breath. 07/28/15   Fransico Meadow, PA-C  azithromycin (ZITHROMAX) 250 MG tablet Take 1 tablet (250 mg total) by mouth daily. Take first 2 tablets together, then 1 every day until finished. 07/28/15   Fransico Meadow, PA-C  meclizine (ANTIVERT) 25 MG tablet Take 25 mg by mouth daily as needed for dizziness.    Historical Provider, MD  traMADol (ULTRAM) 50 MG tablet Take 1 tablet (50 mg total) by mouth every 6 (six) hours as needed. Patient not taking: Reported on 07/28/2015 05/24/15   Comer Locket, PA-C   BP 134/53 mmHg  Pulse 91  Temp(Src) 99.1 F (37.3 C) (Oral)  Resp 18  Ht '5\' 10"'$  (1.778 m)  Wt 92.987 kg  BMI 29.41 kg/m2  SpO2 93% Physical Exam  Constitutional: He is oriented to person, place, and time. He appears well-developed and well-nourished.  HENT:  Head: Normocephalic.  Right Ear: External ear normal.  Left Ear: External ear normal.  Eyes: Conjunctivae and EOM are normal. Pupils are equal, round, and reactive to light.  Cardiovascular: Normal rate.   Pulmonary/Chest: Effort normal.  Abdominal: Soft.  Musculoskeletal: Normal range of motion.  Neurological: He is alert and oriented to person, place, and time. He has normal reflexes.  Skin: Skin is warm.  Psychiatric: He  has a normal mood and affect.  Nursing note and vitals reviewed.   ED Course  Procedures (including critical care time) Labs Review Labs Reviewed  CBC WITH DIFFERENTIAL/PLATELET - Abnormal; Notable for the following:    Hemoglobin 12.8 (*)    HCT 38.7 (*)    Monocytes Absolute 1.2 (*)    All other components within normal limits  COMPREHENSIVE METABOLIC PANEL - Abnormal; Notable for the following:    Sodium 133 (*)    Chloride 99 (*)    Glucose, Bld 121 (*)    Calcium 8.1 (*)    Albumin 3.4 (*)    All other components within normal limits    Imaging Review Dg Chest 2 View  07/28/2015  CLINICAL DATA:  Cough and weakness for 2 days EXAM: CHEST  2 VIEW COMPARISON:  05/27/2014 FINDINGS: Cardiac shadow is within normal limits. The lungs are hyperinflated. Degenerative changes of thoracic spine are seen. No acute infiltrate or sizable effusion is noted. IMPRESSION: No acute abnormality noted. Electronically Signed   By: Inez Catalina M.D.   On: 07/28/2015 13:58   I have personally reviewed and evaluated these images and lab results as part of my medical decision-making.   EKG Interpretation None      MDM Pt given albuterol neb.  rx for zithromax and albuterol inhaler.    Final diagnoses:  [place], PA-C 07/28/15 [place], PA-C 07/28/15 [place], MD 07/28/15 [phone]

## 2015-07-28 NOTE — Discharge Instructions (Signed)
How to Use an Inhaler Proper inhaler technique is very important. Good technique ensures that the medicine reaches the lungs. Poor technique results in depositing the medicine on the tongue and back of the throat rather than in the airways. If you do not use the inhaler with good technique, the medicine will not help you. STEPS TO FOLLOW IF USING AN INHALER WITHOUT AN EXTENSION TUBE  Remove the cap from the inhaler.  If you are using the inhaler for the first time, you will need to prime it. Shake the inhaler for 5 seconds and release four puffs into the air, away from your face. Ask your health care provider or pharmacist if you have questions about priming your inhaler.  Shake the inhaler for 5 seconds before each breath in (inhalation).  Position the inhaler so that the top of the canister faces up.  Put your index finger on the top of the medicine canister. Your thumb supports the bottom of the inhaler.  Open your mouth.  Either place the inhaler between your teeth and place your lips tightly around the mouthpiece, or hold the inhaler 1-2 inches away from your open mouth. If you are unsure of which technique to use, ask your health care provider.  Breathe out (exhale) normally and as completely as possible.  Press the canister down with your index finger to release the medicine.  At the same time as the canister is pressed, inhale deeply and slowly until your lungs are completely filled. This should take 4-6 seconds. Keep your tongue down.  Hold the medicine in your lungs for 5-10 seconds (10 seconds is best). This helps the medicine get into the small airways of your lungs.  Breathe out slowly, through pursed lips. Whistling is an example of pursed lips.  Wait at least 15-30 seconds between puffs. Continue with the above steps until you have taken the number of puffs your health care provider has ordered. Do not use the inhaler more than your health care provider tells  you.  Replace the cap on the inhaler.  Follow the directions from your health care provider or the inhaler insert for cleaning the inhaler. STEPS TO FOLLOW IF USING AN INHALER WITH AN EXTENSION (SPACER)  Remove the cap from the inhaler.  If you are using the inhaler for the first time, you will need to prime it. Shake the inhaler for 5 seconds and release four puffs into the air, away from your face. Ask your health care provider or pharmacist if you have questions about priming your inhaler.  Shake the inhaler for 5 seconds before each breath in (inhalation).  Place the open end of the spacer onto the mouthpiece of the inhaler.  Position the inhaler so that the top of the canister faces up and the spacer mouthpiece faces you.  Put your index finger on the top of the medicine canister. Your thumb supports the bottom of the inhaler and the spacer.  Breathe out (exhale) normally and as completely as possible.  Immediately after exhaling, place the spacer between your teeth and into your mouth. Close your lips tightly around the spacer.  Press the canister down with your index finger to release the medicine.  At the same time as the canister is pressed, inhale deeply and slowly until your lungs are completely filled. This should take 4-6 seconds. Keep your tongue down and out of the way.  Hold the medicine in your lungs for 5-10 seconds (10 seconds is best). This helps the  medicine get into the small airways of your lungs. Exhale.  Repeat inhaling deeply through the spacer mouthpiece. Again hold that breath for up to 10 seconds (10 seconds is best). Exhale slowly. If it is difficult to take this second deep breath through the spacer, breathe normally several times through the spacer. Remove the spacer from your mouth.  Wait at least 15-30 seconds between puffs. Continue with the above steps until you have taken the number of puffs your health care provider has ordered. Do not use the  inhaler more than your health care provider tells you.  Remove the spacer from the inhaler, and place the cap on the inhaler.  Follow the directions from your health care provider or the inhaler insert for cleaning the inhaler and spacer. If you are using different kinds of inhalers, use your quick relief medicine to open the airways 10-15 minutes before using a steroid if instructed to do so by your health care provider. If you are unsure which inhalers to use and the order of using them, ask your health care provider, nurse, or respiratory therapist. If you are using a steroid inhaler, always rinse your mouth with water after your last puff, then gargle and spit out the water. Do not swallow the water. AVOID:  Inhaling before or after starting the spray of medicine. It takes practice to coordinate your breathing with triggering the spray.  Inhaling through the nose (rather than the mouth) when triggering the spray. HOW TO DETERMINE IF YOUR INHALER IS FULL OR NEARLY EMPTY You cannot know when an inhaler is empty by shaking it. A few inhalers are now being made with dose counters. Ask your health care provider for a prescription that has a dose counter if you feel you need that extra help. If your inhaler does not have a counter, ask your health care provider to help you determine the date you need to refill your inhaler. Write the refill date on a calendar or your inhaler canister. Refill your inhaler 7-10 days before it runs out. Be sure to keep an adequate supply of medicine. This includes making sure it is not expired, and that you have a spare inhaler.  SEEK MEDICAL CARE IF:   Your symptoms are only partially relieved with your inhaler.  You are having trouble using your inhaler.  You have some increase in phlegm. SEEK IMMEDIATE MEDICAL CARE IF:   You feel little or no relief with your inhalers. You are still wheezing and are feeling shortness of breath or tightness in your chest or  both.  You have dizziness, headaches, or a fast heart rate.  You have chills, fever, or night sweats.  You have a noticeable increase in phlegm production, or there is blood in the phlegm. MAKE SURE YOU:   Understand these instructions.  Will watch your condition.  Will get help right away if you are not doing well or get worse.   This information is not intended to replace advice given to you by your health care provider. Make sure you discuss any questions you have with your health care provider.   Document Released: 05/25/2000 Document Revised: 03/18/2013 Document Reviewed: 12/25/2012 Elsevier Interactive Patient Education 2016 [employer] Acute Bronchitis Bronchitis is inflammation of the airways that extend from the windpipe into the lungs (bronchi). The inflammation often causes mucus to develop. This leads to a cough, which is the most common symptom of bronchitis.  In acute bronchitis, the condition usually develops suddenly and goes away  over time, usually in a couple weeks. Smoking, allergies, and asthma can make bronchitis worse. Repeated episodes of bronchitis may cause further lung problems.  CAUSES Acute bronchitis is most often caused by the same virus that causes a cold. The virus can spread from person to person (contagious) through coughing, sneezing, and touching contaminated objects. SIGNS AND SYMPTOMS   Cough.   Fever.   Coughing up mucus.   Body aches.   Chest congestion.   Chills.   Shortness of breath.   Sore throat.  DIAGNOSIS  Acute bronchitis is usually diagnosed through a physical exam. Your health care provider will also ask you questions about your medical history. Tests, such as chest X-rays, are sometimes done to rule out other conditions.  TREATMENT  Acute bronchitis usually goes away in a couple weeks. Oftentimes, no medical treatment is necessary. Medicines are sometimes given for relief of fever or cough. Antibiotic medicines  are usually not needed but may be prescribed in certain situations. In some cases, an inhaler may be recommended to help reduce shortness of breath and control the cough. A cool mist vaporizer may also be used to help thin bronchial secretions and make it easier to clear the chest.  HOME CARE INSTRUCTIONS  Get plenty of rest.   Drink enough fluids to keep your urine clear or pale yellow (unless you have a medical condition that requires fluid restriction). Increasing fluids may help thin your respiratory secretions (sputum) and reduce chest congestion, and it will prevent dehydration.   Take medicines only as directed by your health care provider.  If you were prescribed an antibiotic medicine, finish it all even if you start to feel better.  Avoid smoking and secondhand smoke. Exposure to cigarette smoke or irritating chemicals will make bronchitis worse. If you are a smoker, consider using nicotine gum or skin patches to help control withdrawal symptoms. Quitting smoking will help your lungs heal faster.   Reduce the chances of another bout of acute bronchitis by washing your hands frequently, avoiding people with cold symptoms, and trying not to touch your hands to your mouth, nose, or eyes.   Keep all follow-up visits as directed by your health care provider.  SEEK MEDICAL CARE IF: Your symptoms do not improve after 1 week of treatment.  SEEK IMMEDIATE MEDICAL CARE IF:  You develop an increased fever or chills.   You have chest pain.   You have severe shortness of breath.  You have bloody sputum.   You develop dehydration.  You faint or repeatedly feel like you are going to pass out.  You develop repeated vomiting.  You develop a severe headache. MAKE SURE YOU:   Understand these instructions.  Will watch your condition.  Will get help right away if you are not doing well or get worse.   This information is not intended to replace advice given to you by your  health care provider. Make sure you discuss any questions you have with your health care provider.   Document Released: 07/05/2004 Document Revised: 06/18/2014 Document Reviewed: 11/18/2012 Elsevier Interactive Patient Education [employer].

## 2015-07-28 NOTE — ED Notes (Signed)
Productive cough for 4-5 days.  Color is white.  C/o body aches.  Has been around several sick people with cold.

## 2015-11-03 ENCOUNTER — Encounter (INDEPENDENT_AMBULATORY_CARE_PROVIDER_SITE_OTHER): Payer: Self-pay | Admitting: *Deleted

## 2015-11-03 ENCOUNTER — Encounter (INDEPENDENT_AMBULATORY_CARE_PROVIDER_SITE_OTHER): Payer: Self-pay

## 2015-11-03 ENCOUNTER — Other Ambulatory Visit (INDEPENDENT_AMBULATORY_CARE_PROVIDER_SITE_OTHER): Payer: Self-pay | Admitting: Internal Medicine

## 2015-11-03 ENCOUNTER — Ambulatory Visit (INDEPENDENT_AMBULATORY_CARE_PROVIDER_SITE_OTHER): Payer: Medicare Other | Admitting: Internal Medicine

## 2015-11-03 ENCOUNTER — Encounter (INDEPENDENT_AMBULATORY_CARE_PROVIDER_SITE_OTHER): Payer: Self-pay | Admitting: Internal Medicine

## 2015-11-03 DIAGNOSIS — Z86010 Personal history of colonic polyps: Secondary | ICD-10-CM

## 2015-11-03 DIAGNOSIS — E7800 Pure hypercholesterolemia, unspecified: Secondary | ICD-10-CM | POA: Insufficient documentation

## 2015-11-03 DIAGNOSIS — R140 Abdominal distension (gaseous): Secondary | ICD-10-CM | POA: Diagnosis not present

## 2015-11-03 DIAGNOSIS — R195 Other fecal abnormalities: Secondary | ICD-10-CM

## 2015-11-03 DIAGNOSIS — R1030 Lower abdominal pain, unspecified: Secondary | ICD-10-CM | POA: Diagnosis not present

## 2015-11-03 DIAGNOSIS — K5902 Outlet dysfunction constipation: Secondary | ICD-10-CM

## 2015-11-03 NOTE — Progress Notes (Addendum)
   Subjective:    Patient ID: [name], male    DOB: March 08, 1931, 82 y.o.   MRN: [id number]  HPI Presents today with c/o constipation and flatus. Takes Miralax to help him with the constipation. He feels bloated. He says it is hard to pass his stool. Constipation on and off for several months. Sometimes he does have diarrhea. Wife states he has a total of 17 polyps removed in the past. Wife states he usually has a colonoscopy every 3 yrs due to the polyps. Last colonoscopy he was told he could wait 5 yrs which was in 2012. Appetite is good. No weight loss. Occasionally has lower abdominal pain.  Usually has a BM every day with Miralax and Metamucil. He did see some blood this am.     On CT in 2012 hx of rt adrenal nodules, tiny liver lesion.   Last colonoscopy 2012 by Dr. Sherral Hammers in [facility]. One 91m polyp in the mid transverse coon. Diverticulosis sigmoid colon and descending colon. Exam otherwise normal. ( No biopsy sent).  Review of Systems Past Medical History  Diagnosis Date  . Hernia   . Diabetes mellitus   . Cyst of scrotum   . Hyperlipidemia     Past Surgical History  Procedure Laterality Date  . Back surgery    . Cholecystectomy      No Known Allergies  Current Outpatient Prescriptions on File Prior to Visit  Medication Sig Dispense Refill  . aspirin EC 81 MG tablet Take 81 mg by mouth daily.    . Cholecalciferol (VITAMIN D) 2000 UNITS CAPS Take 1 capsule by mouth daily.     .Marland KitchenglipiZIDE (GLUCOTROL) 5 MG tablet Take 2.5 mg by mouth 2 (two) times daily before a meal.    . lisinopril (PRINIVIL,ZESTRIL) 10 MG tablet Take 5 mg by mouth daily.    .Marland Kitchenloratadine (CLARITIN) 10 MG tablet Take 10 mg by mouth daily.    . meclizine (ANTIVERT) 25 MG tablet Take 25 mg by mouth daily as needed for dizziness.    . pantoprazole (PROTONIX) 40 MG tablet Take 40 mg by mouth 2 (two) times daily.     . simvastatin (ZOCOR) 80 MG tablet Take 40 mg by mouth at bedtime.     .Marland Kitchen terazosin (HYTRIN) 5 MG capsule Take 5 mg by mouth at bedtime.     No current facility-administered medications on file prior to visit.        Objective:   Physical Exam Blood pressure 104/62, pulse 72, temperature 97.7 F (36.5 C), height 6' (1.829 m), weight 201 lb (91.173 kg). Alert and oriented. Skin warm and dry. Oral mucosa is moist.   . Sclera anicteric, conjunctivae is pink. Thyroid not enlarged. No cervical lymphadenopathy. Lungs clear. Heart regular rate and rhythm.  Abdomen is soft. Bowel sounds are positive. No hepatomegaly. No abdominal masses felt. No tenderness today.  No edema to lower extremities. Stool brown and guaiac slightly positive.     Lot [id number] 9/17    Assessment & Plan:  Constipation: Continue the Miralax and Metamucil. Colonoscopy: Hx of polyps. Guaiac positive stool . Surveillance colonoscopy.Colonic neoplasm, polyps needs to be ruled out.  The risks and benefits such as perforation, bleeding, and infection were reviewed with the patient and is agreeable. Abdominal pain, bloating, distention: Am going to get a CT abdomen/pelvis with CM.

## 2015-11-03 NOTE — Patient Instructions (Signed)
Labs, CT. Colonoscopy,. The risks and benefits such as perforation, bleeding, and infection were reviewed with the patient and is agreeable.

## 2015-11-04 LAB — CBC WITH DIFFERENTIAL/PLATELET
Basophils Absolute: 0 cells/uL (ref 0–200)
Basophils Relative: 0 %
Eosinophils Absolute: 760 cells/uL — ABNORMAL HIGH (ref 15–500)
Eosinophils Relative: 8 %
HCT: 40 % (ref 38.5–50.0)
Hemoglobin: 13.1 g/dL — ABNORMAL LOW (ref 13.2–17.1)
Lymphocytes Relative: 29 %
Lymphs Abs: 2755 cells/uL (ref 850–3900)
MCH: 28.9 pg (ref 27.0–33.0)
MCHC: 32.8 g/dL (ref 32.0–36.0)
MCV: 88.3 fL (ref 80.0–100.0)
MPV: 11.6 fL (ref 7.5–12.5)
Monocytes Absolute: 855 cells/uL (ref 200–950)
Monocytes Relative: 9 %
Neutro Abs: 5130 cells/uL (ref 1500–7800)
Neutrophils Relative %: 54 %
Platelets: 276 10*3/uL (ref 140–400)
RBC: 4.53 MIL/uL (ref 4.20–5.80)
RDW: 13.5 % (ref 11.0–15.0)
WBC: 9.5 10*3/uL (ref 3.8–10.8)

## 2015-11-04 LAB — CREATININE, SERUM: Creat: 0.95 mg/dL (ref 0.70–1.11)

## 2015-11-08 ENCOUNTER — Other Ambulatory Visit (INDEPENDENT_AMBULATORY_CARE_PROVIDER_SITE_OTHER): Payer: Self-pay | Admitting: *Deleted

## 2015-11-08 NOTE — Telephone Encounter (Signed)
Patient needs trilyte 

## 2015-11-09 ENCOUNTER — Ambulatory Visit (HOSPITAL_COMMUNITY): Payer: Medicare Other

## 2015-11-09 MED ORDER — PEG 3350-KCL-NA BICARB-NACL 420 G PO SOLR: 4000.0000 mL | Freq: Once | ORAL | Status: DC

## 2015-11-10 ENCOUNTER — Ambulatory Visit (HOSPITAL_COMMUNITY)
Admission: RE | Admit: 2015-11-10 | Discharge: 2015-11-10 | Disposition: A | Discharge status: Home / Self Care | Payer: Medicare Other | Source: Ambulatory Visit | Attending: Internal Medicine | Admitting: Internal Medicine

## 2015-11-10 DIAGNOSIS — I700 Atherosclerosis of aorta: Secondary | ICD-10-CM | POA: Insufficient documentation

## 2015-11-10 DIAGNOSIS — R140 Abdominal distension (gaseous): Secondary | ICD-10-CM | POA: Diagnosis present

## 2015-11-10 DIAGNOSIS — R195 Other fecal abnormalities: Secondary | ICD-10-CM

## 2015-11-10 DIAGNOSIS — R1030 Lower abdominal pain, unspecified: Secondary | ICD-10-CM | POA: Diagnosis not present

## 2015-11-10 DIAGNOSIS — J984 Other disorders of lung: Secondary | ICD-10-CM | POA: Diagnosis not present

## 2015-11-10 MED ORDER — IOPAMIDOL (ISOVUE-300) INJECTION 61%
100.0000 mL | Freq: Once | INTRAVENOUS | Status: AC | PRN
  Administered 2015-11-10: 100 mL via INTRAVENOUS

## 2015-11-17 ENCOUNTER — Telehealth (INDEPENDENT_AMBULATORY_CARE_PROVIDER_SITE_OTHER): Payer: Self-pay | Admitting: Internal Medicine

## 2015-11-17 DIAGNOSIS — R911 Solitary pulmonary nodule: Secondary | ICD-10-CM

## 2015-11-17 NOTE — Telephone Encounter (Signed)
PET scan ordered 

## 2015-11-17 NOTE — Telephone Encounter (Signed)
error 

## 2015-11-17 NOTE — Telephone Encounter (Signed)
NM PET scan whole body at [facility]

## 2015-11-18 NOTE — Telephone Encounter (Signed)
PET scan sch'd 11/30/15 at 1030, npo 6 hrs prior, no meds except diabetic med with sip of water, left detailed message for patient

## 2015-11-18 NOTE — Telephone Encounter (Signed)
Please call Ms Kaynan on Monday @ [phone]-- she has questions about Ms Rayquon' PET scan and his TCS sch'd 11/24/15

## 2015-11-18 NOTE — Telephone Encounter (Signed)
error 

## 2015-11-21 NOTE — Telephone Encounter (Signed)
Have spoken with wife. He is scheduled for a PET scan. Colonoscopy this week

## 2015-11-24 ENCOUNTER — Ambulatory Visit (HOSPITAL_COMMUNITY)
Admission: RE | Admit: 2015-11-24 | Discharge: 2015-11-24 | Disposition: A | Discharge status: Home / Self Care | Payer: Medicare Other | Source: Ambulatory Visit | Attending: Internal Medicine | Admitting: Internal Medicine

## 2015-11-24 ENCOUNTER — Encounter (HOSPITAL_COMMUNITY)
Admission: RE | Disposition: A | Discharge status: Home / Self Care | Payer: Self-pay | Source: Ambulatory Visit | Attending: Internal Medicine

## 2015-11-24 ENCOUNTER — Encounter (HOSPITAL_COMMUNITY): Payer: Self-pay | Admitting: *Deleted

## 2015-11-24 DIAGNOSIS — Z87891 Personal history of nicotine dependence: Secondary | ICD-10-CM | POA: Diagnosis not present

## 2015-11-24 DIAGNOSIS — D122 Benign neoplasm of ascending colon: Secondary | ICD-10-CM | POA: Insufficient documentation

## 2015-11-24 DIAGNOSIS — Z7984 Long term (current) use of oral hypoglycemic drugs: Secondary | ICD-10-CM | POA: Diagnosis not present

## 2015-11-24 DIAGNOSIS — Z86010 Personal history of colonic polyps: Secondary | ICD-10-CM

## 2015-11-24 DIAGNOSIS — Z09 Encounter for follow-up examination after completed treatment for conditions other than malignant neoplasm: Secondary | ICD-10-CM

## 2015-11-24 DIAGNOSIS — E785 Hyperlipidemia, unspecified: Secondary | ICD-10-CM | POA: Insufficient documentation

## 2015-11-24 DIAGNOSIS — E119 Type 2 diabetes mellitus without complications: Secondary | ICD-10-CM | POA: Insufficient documentation

## 2015-11-24 DIAGNOSIS — K5902 Outlet dysfunction constipation: Secondary | ICD-10-CM

## 2015-11-24 DIAGNOSIS — Z800 Family history of malignant neoplasm of digestive organs: Secondary | ICD-10-CM

## 2015-11-24 DIAGNOSIS — Z79899 Other long term (current) drug therapy: Secondary | ICD-10-CM | POA: Insufficient documentation

## 2015-11-24 DIAGNOSIS — D123 Benign neoplasm of transverse colon: Secondary | ICD-10-CM

## 2015-11-24 DIAGNOSIS — Z1211 Encounter for screening for malignant neoplasm of colon: Secondary | ICD-10-CM | POA: Diagnosis present

## 2015-11-24 DIAGNOSIS — Z7982 Long term (current) use of aspirin: Secondary | ICD-10-CM | POA: Insufficient documentation

## 2015-11-24 DIAGNOSIS — K6389 Other specified diseases of intestine: Secondary | ICD-10-CM

## 2015-11-24 HISTORY — PX: COLONOSCOPY: SHX5424

## 2015-11-24 LAB — GLUCOSE, CAPILLARY: Glucose-Capillary: 133 mg/dL — ABNORMAL HIGH (ref 65–99)

## 2015-11-24 SURGERY — COLONOSCOPY
Anesthesia: Moderate Sedation

## 2015-11-24 MED ORDER — STERILE WATER FOR IRRIGATION IR SOLN
Status: DC | PRN
  Administered 2015-11-24: 08:00:00

## 2015-11-24 MED ORDER — MEPERIDINE HCL 50 MG/ML IJ SOLN
INTRAMUSCULAR | Status: DC | PRN
  Administered 2015-11-24: 25 mg via INTRAVENOUS

## 2015-11-24 MED ORDER — SODIUM CHLORIDE 0.9 % IV SOLN
INTRAVENOUS | Status: DC
  Administered 2015-11-24: 1000 mL via INTRAVENOUS

## 2015-11-24 MED ORDER — MEPERIDINE HCL 50 MG/ML IJ SOLN
INTRAMUSCULAR | Status: AC
  Filled 2015-11-24: qty 1

## 2015-11-24 MED ORDER — MIDAZOLAM HCL 5 MG/5ML IJ SOLN
INTRAMUSCULAR | Status: DC | PRN
  Administered 2015-11-24: 2 mg via INTRAVENOUS
  Administered 2015-11-24: 1 mg via INTRAVENOUS

## 2015-11-24 MED ORDER — MIDAZOLAM HCL 5 MG/5ML IJ SOLN
INTRAMUSCULAR | Status: AC
  Filled 2015-11-24: qty 5

## 2015-11-24 NOTE — Op Note (Signed)
[facility] Patient Name: [name] Procedure Date: 11/24/2015 7:33 AM MRN: [id number] Date of Birth: 1931-05-01 Attending MD: Hildred Laser , MD CSN: [id number] Age: 82 Admit Type: Outpatient Procedure:                Colonoscopy Indications:              High risk colon cancer surveillance: Personal                            history of colonic polyps, Family history of colon                            cancer in a first-degree relative Providers:                Hildred Laser, MD, Janeece Riggers, RN, Georgeann Oppenheim,                            Technician Referring MD:             Fuller Canada. Manuella Ghazi, MD Medicines:                Meperidine 25 mg IV, Midazolam 3 mg IV Complications:            No immediate complications. Estimated Blood Loss:     Estimated blood loss was minimal. Procedure:                Pre-Anesthesia Assessment:                           - Prior to the procedure, a History and Physical                            was performed, and patient medications and                            allergies were reviewed. The patient's tolerance of                            previous anesthesia was also reviewed. The risks                            and benefits of the procedure and the sedation                            options and risks were discussed with the patient.                            All questions were answered, and informed consent                            was obtained. Prior Anticoagulants: The patient                            last took aspirin 3 days prior to the procedure.  ASA Grade Assessment: II - A patient with mild                            systemic disease. After reviewing the risks and                            benefits, the patient was deemed in satisfactory                            condition to undergo the procedure.                           After obtaining informed consent, the colonoscope                            was  passed under direct vision. Throughout the                            procedure, the patient's blood pressure, pulse, and                            oxygen saturations were monitored continuously. The                            EC-[id number] ([id number]) scope was introduced through                            the anus and advanced to the the terminal ileum.                            The colonoscopy was performed without difficulty.                            The patient tolerated the procedure well. The                            quality of the bowel preparation was adequate. The                            terminal ileum, ileocecal valve, appendiceal                            orifice, and rectum were photographed. Scope In: 7:41:09 AM Scope Out: 8:04:01 AM Scope Withdrawal Time: 0 hours 13 minutes 5 seconds  Total Procedure Duration: 0 hours 22 minutes 52 seconds  Findings:      The terminal ileum appeared normal.      A diffuse area of severe melanosis was found in the entire colon.      Four sessile polyps were found in the splenic flexure, hepatic flexure       and ascending colon. The polyps were small in size. These polyps were       removed with a cold snare. Resection and retrieval were complete. The       pathology specimen was placed into Bottle Number 1.  The retroflexed view of the distal rectum and anal verge was normal and       showed no anal or rectal abnormalities.      Multiple medium-mouthed diverticula were found in the sigmoid colon and       descending colon. Impression:               - The examined portion of the ileum was normal.                           - Melanosis in the colon.                           - Four small polyps at the splenic flexure, at the                            hepatic flexure and in the ascending colon, removed                            with a cold snare. Resected and retrieved.                           - ultiple diverticula and descending  colon, Moderate Sedation:      Moderate (conscious) sedation was administered by the endoscopy nurse       and supervised by the endoscopist. The following parameters were       monitored: oxygen saturation, heart rate, blood pressure, CO2       capnography and response to care. Total physician intraservice time was       27 minutes. Recommendation:           - Patient has a contact number available for                            emergencies. The signs and symptoms of potential                            delayed complications were discussed with the                            patient. Return to normal activities tomorrow.                            Written discharge instructions were provided to the                            patient.                           - High fiber diet today.                           - Resume previous diet.                           - Resume aspirin at prior dose tomorrow.                           -  Await pathology results.                           - Repeat colonoscopy for surveillance based on                            pathology results. Procedure Code(s):        --- Professional ---                           [phone], Colonoscopy, flexible; with removal of                            tumor(s), polyp(s), or other lesion(s) by snare                            technique                           99152, Moderate sedation services provided by the                            same physician or other qualified health care                            professional performing the diagnostic or                            therapeutic service that the sedation supports,                            requiring the presence of an independent trained                            observer to assist in the monitoring of the                            patient's level of consciousness and physiological                            status; initial 15 minutes of intraservice time,                             patient age 62 years or older                           [phone], Moderate sedation services; each additional                            15 minutes intraservice time Diagnosis Code(s):        --- Professional ---                           Z86.010, Personal history of colonic polyps  K63.89, Other specified diseases of intestine                           D12.3, Benign neoplasm of transverse colon (hepatic                            flexure or splenic flexure)                           D12.2, Benign neoplasm of ascending colon                           Z80.0, Family history of malignant neoplasm of                            digestive organs CPT copyright 2016 American [employer] Association. All rights reserved. The codes documented in this report are preliminary and upon coder review may  be revised to meet current compliance requirements. Hildred Laser, MD Hildred Laser, MD 11/24/2015 8:14:05 AM This report has been signed electronically. Number of Addenda: 0

## 2015-11-24 NOTE — Discharge Instructions (Signed)
Resume aspirin on 11/25/2015. Resume other medications and high fiber diet as before. No driving for 24 hours. Physician will call with biopsy results. Colonoscopy, Care After These instructions give you information on caring for yourself after your procedure. Your doctor may also give you more specific instructions. Call your doctor if you have any problems or questions after your procedure. HOME CARE  Do not drive for 24 hours.  Do not sign important papers or use machinery for 24 hours.  You may shower.  You may go back to your usual activities, but go slower for the first 24 hours.  Take rest breaks often during the first 24 hours.  Walk around or use warm packs on your belly (abdomen) if you have belly cramping or gas.  Drink enough fluids to keep your pee (urine) clear or pale yellow.  Resume your normal diet. Avoid heavy or fried foods.  Avoid drinking alcohol for 24 hours or as told by your doctor.  Only take medicines as told by your doctor. If a tissue sample (biopsy) was taken during the procedure:   Do not take aspirin or blood thinners for 7 days, or as told by your doctor.  Do not drink alcohol for 7 days, or as told by your doctor.  Eat soft foods for the first 24 hours. GET HELP IF: You still have a small amount of blood in your poop (stool) 2-3 days after the procedure. GET HELP RIGHT AWAY IF:  You have more than a small amount of blood in your poop.  You see clumps of tissue (blood clots) in your poop.  Your belly is puffy (swollen).  You feel sick to your stomach (nauseous) or throw up (vomit).  You have a fever.  You have belly pain that gets worse and medicine does not help. MAKE SURE YOU:  Understand these instructions.  Will watch your condition.  Will get help right away if you are not doing well or get worse.   This information is not intended to replace advice given to you by your health care provider. Make sure you discuss any  questions you have with your health care provider.   Document Released: 06/30/2010 Document Revised: 06/02/2013 Document Reviewed: 02/02/2013 Elsevier Interactive Patient Education [employer].   Diverticulosis Diverticulosis is the condition that develops when small pouches (diverticula) form in the wall of your colon. Your colon, or large intestine, is where water is absorbed and stool is formed. The pouches form when the inside layer of your colon pushes through weak spots in the outer layers of your colon. CAUSES  No one knows exactly what causes diverticulosis. RISK FACTORS  Being older than 39. Your risk for this condition increases with age. Diverticulosis is rare in people younger than 40 years. By age 32, almost everyone has it.  Eating a low-fiber diet.  Being frequently constipated.  Being overweight.  Not getting enough exercise.  Smoking.  Taking over-the-counter pain medicines, like aspirin and ibuprofen. SYMPTOMS  Most people with diverticulosis do not have symptoms. DIAGNOSIS  Because diverticulosis often has no symptoms, health care providers often discover the condition during an exam for other colon problems. In many cases, a health care provider will diagnose diverticulosis while using a flexible scope to examine the colon (colonoscopy). TREATMENT  If you have never developed an infection related to diverticulosis, you may not need treatment. If you have had an infection before, treatment may include:  Eating more fruits, vegetables, and grains.  Taking  a fiber supplement.  Taking a live bacteria supplement (probiotic).  Taking medicine to relax your colon. HOME CARE INSTRUCTIONS   Drink at least 6-8 glasses of water each day to prevent constipation.  Try not to strain when you have a bowel movement.  Keep all follow-up appointments. If you have had an infection before:  Increase the fiber in your diet as directed by your health care  provider or dietitian.  Take a dietary fiber supplement if your health care provider approves.  Only take medicines as directed by your health care provider. SEEK MEDICAL CARE IF:   You have abdominal pain.  You have bloating.  You have cramps.  You have not gone to the bathroom in 3 days. SEEK IMMEDIATE MEDICAL CARE IF:   Your pain gets worse.  Yourbloating becomes very bad.  You have a fever or chills, and your symptoms suddenly get worse.  You begin vomiting.  You have bowel movements that are bloody or black. MAKE SURE YOU:  Understand these instructions.  Will watch your condition.  Will get help right away if you are not doing well or get worse.   This information is not intended to replace advice given to you by your health care provider. Make sure you discuss any questions you have with your health care provider.   Document Released: 02/23/2004 Document Revised: 06/02/2013 Document Reviewed: 04/22/2013 Elsevier Interactive Patient Education 2016 Elsevier Inc.  Colon Polyps Polyps are lumps of extra tissue growing inside the body. Polyps can grow in the large intestine (colon). Most colon polyps are noncancerous (benign). However, some colon polyps can become cancerous over time. Polyps that are larger than a pea may be harmful. To be safe, caregivers remove and test all polyps. CAUSES  Polyps form when mutations in the genes cause your cells to grow and divide even though no more tissue is needed. RISK FACTORS There are a number of risk factors that can increase your chances of getting colon polyps. They include:  Being older than 50 years.  Family history of colon polyps or colon cancer.  Long-term colon diseases, such as colitis or Crohn disease.  Being overweight.  Smoking.  Being inactive.  Drinking too much alcohol. SYMPTOMS  Most small polyps do not cause symptoms. If symptoms are present, they may include:  Blood in the stool. The stool  may look dark red or black.  Constipation or diarrhea that lasts longer than 1 week. DIAGNOSIS People often do not know they have polyps until their caregiver finds them during a regular checkup. Your caregiver can use 4 tests to check for polyps:  Digital rectal exam. The caregiver wears gloves and feels inside the rectum. This test would find polyps only in the rectum.  Barium enema. The caregiver puts a liquid called barium into your rectum before taking X-rays of your colon. Barium makes your colon look white. Polyps are dark, so they are easy to see in the X-ray pictures.  Sigmoidoscopy. A thin, flexible tube (sigmoidoscope) is placed into your rectum. The sigmoidoscope has a light and tiny camera in it. The caregiver uses the sigmoidoscope to look at the last third of your colon.  Colonoscopy. This test is like sigmoidoscopy, but the caregiver looks at the entire colon. This is the most common method for finding and removing polyps. TREATMENT  Any polyps will be removed during a sigmoidoscopy or colonoscopy. The polyps are then tested for cancer. PREVENTION  To help lower your risk of getting more  colon polyps:  Eat plenty of fruits and vegetables. Avoid eating fatty foods.  Do not smoke.  Avoid drinking alcohol.  Exercise every day.  Lose weight if recommended by your caregiver.  Eat plenty of calcium and folate. Foods that are rich in calcium include milk, cheese, and broccoli. Foods that are rich in folate include chickpeas, kidney beans, and spinach. HOME CARE INSTRUCTIONS Keep all follow-up appointments as directed by your caregiver. You may need periodic exams to check for polyps. SEEK MEDICAL CARE IF: You notice bleeding during a bowel movement.   This information is not intended to replace advice given to you by your health care provider. Make sure you discuss any questions you have with your health care provider.   Document Released: 02/22/2004 Document Revised:  06/18/2014 Document Reviewed: 08/07/2011 Elsevier Interactive Patient Education [employer].

## 2015-11-24 NOTE — H&P (Signed)
[name] is an 82 y.o. male.   Chief Complaint: Patient is here for colonoscopy. HPI: Patient is 82 year old Caucasian male with history of multiple colonic adenomas and is here for surveillance colonoscopy. I'll not use at 17 polyps removed. Last exam was about 5 years ago but none 101 adenoma removed. He's had mild constipation intermittently. He denies abdominal pain or rectal bleeding. He has good appetite and his weight has been stable. Family history is positive for CRC and brother who was 55-69 at the time of diagnosis and died within 3-4 years.  Past Medical History  Diagnosis Date  . Hernia   . Diabetes mellitus   . Cyst of scrotum   . Hyperlipidemia     Past Surgical History  Procedure Laterality Date  . Back surgery    . Cholecystectomy      Family History  Problem Relation Age of Onset  . Cancer Brother     colon CA   Social History:  reports that he quit smoking about 11 years ago. He does not have any smokeless tobacco history on file. He reports that he does not drink alcohol or use illicit drugs.  Allergies: No Known Allergies  Medications Prior to Admission  Medication Sig Dispense Refill  . aspirin EC 81 MG tablet Take 81 mg by mouth daily.    . Cholecalciferol (VITAMIN D) 2000 UNITS CAPS Take 1 capsule by mouth daily.     Marland Kitchen glipiZIDE (GLUCOTROL) 5 MG tablet Take 2.5 mg by mouth 2 (two) times daily before a meal.    . lisinopril (PRINIVIL,ZESTRIL) 10 MG tablet Take 5 mg by mouth daily.    Marland Kitchen loratadine (CLARITIN) 10 MG tablet Take 10 mg by mouth daily.    . pantoprazole (PROTONIX) 40 MG tablet Take 40 mg by mouth 2 (two) times daily.     . polyethylene glycol (MIRALAX / GLYCOLAX) packet Take 17 g by mouth daily.    . simvastatin (ZOCOR) 80 MG tablet Take 40 mg by mouth at bedtime.     Marland Kitchen terazosin (HYTRIN) 5 MG capsule Take 5 mg by mouth at bedtime.    . meclizine (ANTIVERT) 25 MG tablet Take 25 mg by mouth daily as needed for dizziness.    .  polyethylene glycol-electrolytes (TRILYTE) 420 g solution Take 4,000 mLs by mouth once. 4000 mL 0    Results for orders placed or performed during the hospital encounter of 11/24/15 (from the past 48 hour(s))  Glucose, capillary     Status: Abnormal   Collection Time: 11/24/15  7:19 AM  Result Value Ref Range   Glucose-Capillary 133 (H) 65 - 99 mg/dL   No results found.  ROS  Blood pressure 162/70, pulse 68, temperature 97.6 F (36.4 C), temperature source Oral, resp. rate 16, height 6' (1.829 m), weight 201 lb (91.173 kg), SpO2 97 %. Physical Exam  Constitutional: He appears well-developed and well-nourished.  HENT:  Mouth/Throat: Oropharynx is clear and moist.  Eyes: Conjunctivae are normal. No scleral icterus.  Neck: No thyromegaly present.  Cardiovascular: Normal rate, regular rhythm and normal heart sounds.   No murmur heard. Respiratory: Effort normal and breath sounds normal.  GI: Soft. He exhibits no distension and no mass. There is no tenderness.  Musculoskeletal: He exhibits no edema.  Lymphadenopathy:    He has no cervical adenopathy.  Neurological: He is alert.  Skin: Skin is warm and dry.     Assessment/Plan History of multiple colonic adenomas. Family history of CRC  in a brother. Surveillance colonoscopy.  Hildred Laser, MD 11/24/2015, 7:33 AM

## 2015-11-29 ENCOUNTER — Telehealth (INDEPENDENT_AMBULATORY_CARE_PROVIDER_SITE_OTHER): Payer: Self-pay | Admitting: Internal Medicine

## 2015-11-29 NOTE — Telephone Encounter (Signed)
PET scan ordered 

## 2015-11-29 NOTE — Addendum Note (Signed)
Addended by: Butch Penny on: 11/29/2015 12:14 PM   Modules accepted: Orders

## 2015-11-30 ENCOUNTER — Encounter (HOSPITAL_COMMUNITY): Payer: Self-pay | Admitting: Internal Medicine

## 2015-11-30 ENCOUNTER — Encounter (HOSPITAL_COMMUNITY)
Admission: RE | Admit: 2015-11-30 | Discharge: 2015-11-30 | Disposition: A | Discharge status: Home / Self Care | Payer: Medicare Other | Source: Ambulatory Visit | Attending: Internal Medicine | Admitting: Internal Medicine

## 2015-11-30 ENCOUNTER — Encounter (HOSPITAL_COMMUNITY): Payer: Medicare Other

## 2015-11-30 DIAGNOSIS — R911 Solitary pulmonary nodule: Secondary | ICD-10-CM

## 2015-11-30 DIAGNOSIS — J984 Other disorders of lung: Secondary | ICD-10-CM | POA: Diagnosis present

## 2015-11-30 LAB — GLUCOSE, CAPILLARY: Glucose-Capillary: 155 mg/dL — ABNORMAL HIGH (ref 65–99)

## 2015-11-30 MED ORDER — FLUDEOXYGLUCOSE F - 18 (FDG) INJECTION
10.0000 | Freq: Once | INTRAVENOUS | Status: AC | PRN
  Administered 2015-11-30: 10 via INTRAVENOUS

## 2015-11-30 NOTE — Telephone Encounter (Signed)
error 

## 2015-12-02 ENCOUNTER — Ambulatory Visit (INDEPENDENT_AMBULATORY_CARE_PROVIDER_SITE_OTHER): Payer: Medicare Other | Admitting: Internal Medicine

## 2015-12-02 ENCOUNTER — Encounter (INDEPENDENT_AMBULATORY_CARE_PROVIDER_SITE_OTHER): Payer: Self-pay | Admitting: Internal Medicine

## 2015-12-02 ENCOUNTER — Other Ambulatory Visit (HOSPITAL_COMMUNITY): Payer: Self-pay | Admitting: Oncology

## 2015-12-02 VITALS — BP 132/58 | HR 64 | Temp 97.7°F | Ht 72.0 in | Wt 203.0 lb

## 2015-12-02 DIAGNOSIS — Z860100 Personal history of colon polyps, unspecified: Secondary | ICD-10-CM

## 2015-12-02 DIAGNOSIS — C3491 Malignant neoplasm of unspecified part of right bronchus or lung: Secondary | ICD-10-CM

## 2015-12-02 DIAGNOSIS — R918 Other nonspecific abnormal finding of lung field: Secondary | ICD-10-CM

## 2015-12-02 DIAGNOSIS — D649 Anemia, unspecified: Secondary | ICD-10-CM

## 2015-12-02 DIAGNOSIS — C3490 Malignant neoplasm of unspecified part of unspecified bronchus or lung: Secondary | ICD-10-CM

## 2015-12-02 HISTORY — DX: Malignant neoplasm of unspecified part of right bronchus or lung: C34.91

## 2015-12-02 NOTE — Progress Notes (Signed)
Subjective:    Patient ID: [name], male    DOB: June 04, 1931, 82 y.o.   MRN: [id number]  HPI  Here today to discuss results of his PET scan. Seen in office in May with c/o constipation and bloating. Hx of colon polyps. Also c/o some lower abdominal pain. CT scan  ordered.  CT scan revealed lesion in rt middle lobe of lung. PET scan revealed spiculated rt middle lobe nodule mildly hypermetabolic and most consistent with a stage 1A adenocarcinoma.  Family hx of colon cancer in a brother at age 21.    On CT in 2012 hx of rt adrenal nodules, tiny liver lesion.   Last colonoscopy 2012 by Dr. Sherral Hammers in [facility]. One 24m polyp in the mid transverse coon. Diverticulosis sigmoid colon and descending colon. Exam otherwise normal. ( No biopsy sent).   11/30/2015 PET: skull to thigh:  IMPRESSION: 1. Spiculated right middle lobe nodule is mildly hypermetabolic and most consistent with a stage IA adenocarcinoma. 2. Mildly hypermetabolic right adrenal nodule has CT attenuation characteristics consistent with an adenoma. 11/24/2015 Colonoscopy: Indications: High risk colon cancer surveillance: Personal   history of colonic polyps, Family history of colon   cancer in a first-degree relative Impression:   The examined portion of the ileum was normal. Melanosis in the colon. Four small polyps at the splenic flexure, at the hepatic flexure and in the ascending colon with a cold snare. Resected retrieved.  multiple diverticula and descending colon. Biopsy: Tubular adenomas.   11/10/2015 CT abdomen/pelvis with CM; abdominal pain: IMPRESSION: 13 mm spiculated lesion seen in right middle lobe. PET scan is recommended to evaluate for possible malignancy. These results will be called to the ordering clinician or representative by the Radiologist Assistant, and communication documented in the PACS or zVision Dashboard.   15 mm right adrenal nodule is noted. Follow-up CT scan or MRI scan in 6-12  months is recommended to ensure stability.   Atherosclerosis of abdominal aorta is noted without aneurysm formation.   No other significant abnormality seen in the abdomen or pelvis.   Review of Systems Past Medical History  Diagnosis Date  . Hernia   . Diabetes mellitus   . Cyst of scrotum   . Hyperlipidemia   . Adenocarcinoma, lung ([facility]     Past Surgical History  Procedure Laterality Date  . Back surgery    . Cholecystectomy    . Colonoscopy N/A 11/24/2015    Procedure: COLONOSCOPY;  Surgeon: NRogene Houston MD;  Location: [facility];  Service: Endoscopy;  Laterality: N/A;  730    No Known Allergies  Current Outpatient Prescriptions on File Prior to Visit  Medication Sig Dispense Refill  . aspirin EC 81 MG tablet Take 81 mg by mouth daily.    . Cholecalciferol (VITAMIN D) 2000 UNITS CAPS Take 1 capsule by mouth daily.     .Marland KitchenglipiZIDE (GLUCOTROL) 5 MG tablet Take 2.5 mg by mouth 2 (two) times daily before a meal.    . lisinopril (PRINIVIL,ZESTRIL) 10 MG tablet Take 5 mg by mouth daily.    .Marland Kitchenloratadine (CLARITIN) 10 MG tablet Take 10 mg by mouth daily.    . meclizine (ANTIVERT) 25 MG tablet Take 25 mg by mouth daily as needed for dizziness.    . pantoprazole (PROTONIX) 40 MG tablet Take 40 mg by mouth 2 (two) times daily.     . polyethylene glycol (MIRALAX / GLYCOLAX) packet Take 17 g by mouth daily.    .Marland Kitchen  simvastatin (ZOCOR) 80 MG tablet Take 40 mg by mouth at bedtime.     Marland Kitchen terazosin (HYTRIN) 5 MG capsule Take 5 mg by mouth at bedtime.     No current facility-administered medications on file prior to visit.        Objective:   Physical ExamBlood pressure 132/58, pulse 64, temperature 97.7 F (36.5 C), height 6' (1.829 m), weight 203 lb (92.08 kg).  Physcial deferred. Patient is here today for me to go over the results of his CT and PET scan.  I have answered all questions to the best of my knowledge.       Assessment & Plan:  Lung Adenocarcinoma. Referral  to Dr. Whitney Muse.

## 2015-12-02 NOTE — Patient Instructions (Signed)
Referral to Dr Whitney Muse

## 2015-12-05 ENCOUNTER — Other Ambulatory Visit (HOSPITAL_COMMUNITY): Payer: Self-pay | Admitting: Oncology

## 2015-12-05 DIAGNOSIS — C3490 Malignant neoplasm of unspecified part of unspecified bronchus or lung: Secondary | ICD-10-CM

## 2015-12-07 ENCOUNTER — Encounter (HOSPITAL_COMMUNITY): Payer: Medicare Other | Attending: Hematology & Oncology

## 2015-12-07 ENCOUNTER — Ambulatory Visit (HOSPITAL_COMMUNITY)
Admission: RE | Admit: 2015-12-07 | Discharge: 2015-12-07 | Disposition: A | Discharge status: Home / Self Care | Payer: Medicare Other | Source: Ambulatory Visit | Attending: Oncology | Admitting: Oncology

## 2015-12-07 DIAGNOSIS — C3490 Malignant neoplasm of unspecified part of unspecified bronchus or lung: Secondary | ICD-10-CM | POA: Diagnosis present

## 2015-12-07 DIAGNOSIS — E538 Deficiency of other specified B group vitamins: Secondary | ICD-10-CM | POA: Insufficient documentation

## 2015-12-07 DIAGNOSIS — D649 Anemia, unspecified: Secondary | ICD-10-CM

## 2015-12-07 DIAGNOSIS — R918 Other nonspecific abnormal finding of lung field: Secondary | ICD-10-CM | POA: Insufficient documentation

## 2015-12-07 LAB — PULMONARY FUNCTION TEST
DL/VA % pred: 45 %
DL/VA: 2.14 ml/min/mmHg/L
DLCO cor % pred: 35 %
DLCO cor: 12.4 ml/min/mmHg
DLCO unc % pred: 33 %
DLCO unc: 11.88 ml/min/mmHg
FEF 25-75 Post: 1.2 L/sec
FEF 25-75 Pre: 0.66 L/sec
FEF2575-%Change-Post: 81 %
FEF2575-%Pred-Post: 60 %
FEF2575-%Pred-Pre: 33 %
FEV1-%Change-Post: 15 %
FEV1-%Pred-Post: 77 %
FEV1-%Pred-Pre: 66 %
FEV1-Post: 2.31 L
FEV1-Pre: 2 L
FEV1FVC-%Change-Post: 3 %
FEV1FVC-%Pred-Pre: 81 %
FEV6-%Change-Post: 15 %
FEV6-%Pred-Post: 89 %
FEV6-%Pred-Pre: 76 %
FEV6-Post: 3.51 L
FEV6-Pre: 3.03 L
FEV6FVC-%Change-Post: 3 %
FEV6FVC-%Pred-Post: 98 %
FEV6FVC-%Pred-Pre: 94 %
FVC-%Change-Post: 11 %
FVC-%Pred-Post: 90 %
FVC-%Pred-Pre: 81 %
FVC-Post: 3.85 L
FVC-Pre: 3.45 L
Post FEV1/FVC ratio: 60 %
Post FEV6/FVC ratio: 91 %
Pre FEV1/FVC ratio: 58 %
Pre FEV6/FVC Ratio: 88 %
RV % pred: 144 %
RV: 4.1 L
TLC % pred: 102 %
TLC: 7.69 L

## 2015-12-07 LAB — COMPREHENSIVE METABOLIC PANEL
ALT: 18 U/L (ref 17–63)
AST: 22 U/L (ref 15–41)
Albumin: 3.5 g/dL (ref 3.5–5.0)
Alkaline Phosphatase: 64 U/L (ref 38–126)
Anion gap: 4 — ABNORMAL LOW (ref 5–15)
BUN: 15 mg/dL (ref 6–20)
CO2: 25 mmol/L (ref 22–32)
Calcium: 8.4 mg/dL — ABNORMAL LOW (ref 8.9–10.3)
Chloride: 109 mmol/L (ref 101–111)
Creatinine, Ser: 0.95 mg/dL (ref 0.61–1.24)
GFR calc Af Amer: >60 mL/min (ref >60)
GFR calc non Af Amer: >60 mL/min (ref >60)
Glucose, Bld: 153 mg/dL — ABNORMAL HIGH (ref 65–99)
Potassium: 3.7 mmol/L (ref 3.5–5.1)
Sodium: 138 mmol/L (ref 135–145)
Total Bilirubin: 0.7 mg/dL (ref 0.3–1.2)
Total Protein: 6.5 g/dL (ref 6.5–8.1)

## 2015-12-07 LAB — CBC WITH DIFFERENTIAL/PLATELET
Basophils Absolute: 0.1 10*3/uL (ref 0.0–0.1)
Basophils Relative: 1 %
Eosinophils Absolute: 0.9 10*3/uL — ABNORMAL HIGH (ref 0.0–0.7)
Eosinophils Relative: 10 %
HCT: 39 % (ref 39.0–52.0)
Hemoglobin: 13.2 g/dL (ref 13.0–17.0)
Lymphocytes Relative: 25 %
Lymphs Abs: 2.3 10*3/uL (ref 0.7–4.0)
MCH: 29.8 pg (ref 26.0–34.0)
MCHC: 33.8 g/dL (ref 30.0–36.0)
MCV: 88 fL (ref 78.0–100.0)
Monocytes Absolute: 0.9 10*3/uL (ref 0.1–1.0)
Monocytes Relative: 9 %
Neutro Abs: 5.1 10*3/uL (ref 1.7–7.7)
Neutrophils Relative %: 55 %
Platelets: 245 10*3/uL (ref 150–400)
RBC: 4.43 MIL/uL (ref 4.22–5.81)
RDW: 12.9 % (ref 11.5–15.5)
WBC: 9.3 10*3/uL (ref 4.0–10.5)

## 2015-12-07 LAB — IRON AND TIBC
Iron: 71 ug/dL (ref 45–182)
Saturation Ratios: 23 % (ref 17.9–39.5)
TIBC: 302 ug/dL (ref 250–450)
UIBC: 231 ug/dL

## 2015-12-07 LAB — VITAMIN B12: Vitamin B-12: 170 pg/mL — ABNORMAL LOW (ref 180–914)

## 2015-12-07 LAB — FERRITIN: Ferritin: 40 ng/mL (ref 24–336)

## 2015-12-07 LAB — FOLATE: Folate: 39.9 ng/mL (ref >5.9)

## 2015-12-07 MED ORDER — ALBUTEROL SULFATE (2.5 MG/3ML) 0.083% IN NEBU
2.5000 mg | INHALATION_SOLUTION | Freq: Once | RESPIRATORY_TRACT | Status: AC
  Administered 2015-12-07: 2.5 mg via RESPIRATORY_TRACT

## 2015-12-08 ENCOUNTER — Other Ambulatory Visit (HOSPITAL_COMMUNITY): Payer: Self-pay | Admitting: Oncology

## 2015-12-08 DIAGNOSIS — E538 Deficiency of other specified B group vitamins: Secondary | ICD-10-CM | POA: Insufficient documentation

## 2015-12-09 ENCOUNTER — Encounter (HOSPITAL_COMMUNITY): Payer: Self-pay | Admitting: Oncology

## 2015-12-09 ENCOUNTER — Encounter (HOSPITAL_BASED_OUTPATIENT_CLINIC_OR_DEPARTMENT_OTHER): Payer: Medicare Other | Admitting: Oncology

## 2015-12-09 ENCOUNTER — Other Ambulatory Visit (HOSPITAL_COMMUNITY): Payer: Medicare Other

## 2015-12-09 VITALS — BP 147/55 | HR 65 | Temp 98.7°F | Resp 20 | Wt 203.4 lb

## 2015-12-09 DIAGNOSIS — E538 Deficiency of other specified B group vitamins: Secondary | ICD-10-CM | POA: Diagnosis not present

## 2015-12-09 DIAGNOSIS — R918 Other nonspecific abnormal finding of lung field: Secondary | ICD-10-CM | POA: Diagnosis not present

## 2015-12-09 DIAGNOSIS — R911 Solitary pulmonary nodule: Secondary | ICD-10-CM

## 2015-12-09 DIAGNOSIS — C342 Malignant neoplasm of middle lobe, bronchus or lung: Secondary | ICD-10-CM | POA: Diagnosis not present

## 2015-12-09 MED ORDER — CYANOCOBALAMIN 1000 MCG/ML IJ SOLN
1000.0000 ug | Freq: Once | INTRAMUSCULAR | Status: AC
  Administered 2015-12-09: 1000 ug via INTRAMUSCULAR

## 2015-12-09 MED ORDER — CYANOCOBALAMIN 1000 MCG/ML IJ SOLN
INTRAMUSCULAR | Status: AC
  Filled 2015-12-09: qty 1

## 2015-12-09 NOTE — Progress Notes (Signed)
[name] presents today for injection per MD orders. B12 1000 mcg administered IM in right Upper Arm (deltoid) by Forest Gleason, RN. Administration without incident. Patient tolerated well.

## 2015-12-09 NOTE — Patient Instructions (Addendum)
[place] at [facility] Discharge Instructions  RECOMMENDATIONS MADE BY THE CONSULTANT AND ANY TEST RESULTS WILL BE SENT TO YOUR REFERRING PHYSICIAN.  You were seen by [name], PA today. Lab work today to check your vitamin B12 levels. You received your vitamin B12 injection today. Continue vitamin B12 injections weekly for 4 weeks and then monthly after that.  Referral to Dr. Roxan Hockey will be completed by the clinic. Dr. Leonarda Salon office will contact you with an appointment. Return to clinic in 4 weeks for follow-up appointment. Call clinic with any questions or concerns.   Thank you for choosing [place] at [facility] to provide your oncology and hematology care.  To afford each patient quality time with our provider, please arrive at least 15 minutes before your scheduled appointment time.   Beginning January 23rd 2017 lab work for the [facility] will be done in the  Main lab at [facility] on 1st floor. If you have a lab appointment with the [facility] please come in thru the  Main Entrance and check in at the main information desk  You need to re-schedule your appointment should you arrive 10 or more minutes late.  We strive to give you quality time with our providers, and arriving late affects you and other patients whose appointments are after yours.  Also, if you no show three or more times for appointments you may be dismissed from the clinic at the providers discretion.     Again, thank you for choosing [facility].  Our hope is that these requests will decrease the amount of time that you wait before being seen by our physicians.       _____________________________________________________________  Should you have questions after your visit to [facility], please contact our office at [phone] between the hours of 8:30 a.m. and 4:30 p.m.  Voicemails left after 4:30 p.m. will not be returned  until the following business day.  For prescription refill requests, have your pharmacy contact our office.         Resources For Cancer Patients and their Caregivers ? American Cancer Society: Can assist with transportation, wigs, general needs, runs Look Good Feel Better.        519-037-2566 ? Cancer Care: Provides financial assistance, online support groups, medication/co-pay assistance.  1-800-813-HOPE 360-115-8817) ? [facility] Assists [place] cancer patients and their families through emotional , educational and financial support.  201-670-2262 ? [place] DSS Where to apply for food stamps, Medicaid and utility assistance. (684) 063-2589 ? [facility]: Transportation to medical appointments. 912-600-9281 ? Social Security Administration: May apply for disability if have a Stage IV cancer. 959-336-5777 587-740-5576 ? [employer], Disability and Transit Services: Assists with nutrition, care and transit needs. [place] Support Programs: '@10RELATIVEDAYS'$ @ > Cancer Support Group  2nd Tuesday of the month 1pm-2pm, Journey Room  > Creative Journey  3rd Tuesday of the month 1130am-1pm, Journey Room  > Look Good Feel Better  1st Wednesday of the month 10am-12 noon, Journey Room (Call [place] to register [phone])

## 2015-12-11 ENCOUNTER — Encounter (HOSPITAL_COMMUNITY): Payer: Self-pay | Admitting: Oncology

## 2015-12-11 NOTE — Progress Notes (Signed)
[facility] Hematology/Oncology Consultation   Name: [name]      MRN: [id number]    Date: 12/11/2015 Time:9:22 AM   REFERRING PHYSICIAN:  Deberah Castle, NP (GI)  REASON FOR CONSULT:  "Lung Cancer"   DIAGNOSIS:  1.8 cm RML pulmonary lesion that is hypermetabolic on PET imaging.  HISTORY OF PRESENT ILLNESS:   [name] is a 82 y.o. male with a medical history significant for DM, hypercholesterolemia, and tobacco abuse who is referred to the [facility] for "lung cancer."  The patient reported to Deberah Castle due to abdominal discomfort and bloating.  A CT scan was performed a that time demonstrating a RML pulmonary nodule.  He then underwent colonoscopy on 11/24/2015.  Colonoscopy revealed melanosis in the colon and 4 polyps that were removed.  Pathology from polypectomy is negative.  He was subsequently referred to oncology.  I personally reviewed and went over laboratory results with the patient.  The results are noted within this dictation.  On past lab work, the patient was noted to be mildly anemic.  Therefore, I ordered an peripheral anemia work-up that revealed B12 deficiency.  Additional lab work and B12 injection is ordered today.  I personally reviewed and went over radiographic studies with the patient.  The results are noted within this dictation.  CT scan on 6/1 demonstrates a 13 mm RML pulmonary nodule and subsequent PET scan demonstrates mild hypermetabolic activity of this lesion suspicious for a primary bronchogenic carcinoma.  I personally reviewed and went over pathology results with the patient.  Pathology from polypectomy is negative for any malignancy or high grade dysplasia.  Review of Systems  Constitutional: Negative for fever, chills, weight loss and malaise/fatigue.  HENT: Negative for congestion, sore throat and tinnitus.   Eyes: Negative for blurred vision and double vision.  Respiratory: Negative for cough, hemoptysis  and shortness of breath.   Cardiovascular: Negative for chest pain, palpitations, orthopnea and leg swelling.  Gastrointestinal: Negative for nausea, vomiting, abdominal pain, diarrhea, constipation, blood in stool and melena.  Genitourinary: Negative for dysuria and urgency.  Musculoskeletal: Negative.   Skin: Negative for itching and rash.  Neurological: Negative.  Negative for dizziness, sensory change, speech change, focal weakness, seizures, loss of consciousness, weakness and headaches.  Endo/Heme/Allergies: Negative.   Psychiatric/Behavioral: Negative.      PAST MEDICAL HISTORY:   Past Medical History  Diagnosis Date  . Hernia   . Diabetes mellitus   . Cyst of scrotum   . Hyperlipidemia   . Adenocarcinoma, lung ([place])   . Arthritis   . Lung cancer ([place]) 12/02/2015  . B12 deficiency 12/08/2015    ALLERGIES: No Known Allergies    MEDICATIONS: I have reviewed the patient's current medications.    Current Outpatient Prescriptions on File Prior to Visit  Medication Sig Dispense Refill  . aspirin EC 81 MG tablet Take 81 mg by mouth daily.    . Cholecalciferol (VITAMIN D) 2000 UNITS CAPS Take 1 capsule by mouth daily.     Mar[facility] Kitchen glipiZIDE (GLUCOTROL) 5 MG tablet Take 2.5 mg by mouth 2 (two) times daily before a meal.    . lisinopril (PRINIVIL,ZESTRIL) 10 MG tablet Take 5 mg by mouth daily.    Marland Kitchen loratadine (CLARITIN) 10 MG tablet Take 10 mg by mouth daily.    . meclizine (ANTIVERT) 25 MG tablet Take 25 mg by mouth daily as needed for dizziness.    . pantoprazole (  PROTONIX) 40 MG tablet Take 40 mg by mouth 2 (two) times daily.     . polyethylene glycol (MIRALAX / GLYCOLAX) packet Take 17 g by mouth daily.    . simvastatin (ZOCOR) 80 MG tablet Take 40 mg by mouth at bedtime.     Marland Kitchen terazosin (HYTRIN) 5 MG capsule Take 5 mg by mouth at bedtime.     No current facility-administered medications on file prior to visit.     PAST SURGICAL HISTORY Past Surgical History  Procedure  Laterality Date  . Back surgery    . Cholecystectomy    . Colonoscopy N/A 11/24/2015    Procedure: COLONOSCOPY;  Surgeon: Rogene Houston, MD;  Location: [facility];  Service: Endoscopy;  Laterality: N/A;  730  . Lumbar disc surgery      per patient report    FAMILY HISTORY: Family History  Problem Relation Age of Onset  . Cancer Brother     colon CA  Mother died at the age of 26 secondary to complications associated with childbirth. Fatjer passed in his 74's from a possible stroke versus blood clot. He has 1 brother who is 55 years old He has 1 sister who is 82 years old He has another borther who is 80 years old.   His youngest brother is 39 years old.  1 brother is deceased at the age of 65 secondary to colon cancer.  SOCIAL HISTORY: He is an ex-smoker, quitting about 15 years ago after smoking 0.5ppd x 50 years.  He does not drink EtOH, but he admits that he used to drink EtOH.  He denies any illicit drug abuse.  He denies any religious affiliation.  He is a retired Magazine features editor.  Social History   Social History  . Marital Status: Married    Spouse Name: N/A  . Number of Children: N/A  . Years of Education: N/A   Social History Main Topics  . Smoking status: Former Smoker    Quit date: 05/27/2004  . Smokeless tobacco: None  . Alcohol Use: No  . Drug Use: No  . Sexual Activity: Not Asked   Other Topics Concern  . None   Social History Narrative    PERFORMANCE STATUS: The patient's performance status is 0 - Asymptomatic  PHYSICAL EXAM: Most Recent Vital Signs: Blood pressure 147/55, pulse 65, temperature 98.7 F (37.1 C), temperature source Oral, resp. rate 20, weight 203 lb 6.4 oz (92.262 kg), SpO2 97 %. BP 147/55 mmHg  Pulse 65  Temp(Src) 98.7 F (37.1 C) (Oral)  Resp 20  Wt 203 lb 6.4 oz (92.262 kg)  SpO2 97%  General Appearance:    Alert, cooperative, no distress, appears stated age, accompanied by his wife.  Head:    Normocephalic,  without obvious abnormality, atraumatic  Eyes:    Conjunctiva/corneas clear, EOM's intact       Ears:    External anatomy is normal.  Nose:   Nares normal, mucosa normal, no drainage or sinus tenderness  Throat:   Lips, mucosa, and tongue normal  Neck:   Supple, symmetrical, trachea midline, no adenopathy;       thyroid:  No enlargement/tenderness/nodules  Back:     Symmetric, no curvature, ROM normal, no CVA tenderness  Lungs:     Clear to auscultation bilaterally, respirations unlabored  Chest wall:    No tenderness or deformity  Heart:    Regular rate and rhythm, S1 and S2 normal, no murmur, rub   or  gallop  Abdomen:     Soft, non-tender, bowel sounds active all four quadrants,    no masses, no organomegaly        Extremities:   Extremities normal, atraumatic, no cyanosis or edema  Pulses:   2+ and symmetric all extremities  Skin:   Skin color, texture, turgor normal, no rashes or lesions  Lymph nodes:   Cervical, supraclavicular, and axillary nodes normal  Neurologic:   CNII-XII intact. Normal strength, sensation and reflexes      throughout    LABORATORY DATA:  CBC    Component Value Date/Time   WBC 9.3 12/07/2015 1052   RBC 4.43 12/07/2015 1052   HGB 13.2 12/07/2015 1052   HCT 39.0 12/07/2015 1052   PLT 245 12/07/2015 1052   MCV 88.0 12/07/2015 1052   MCH 29.8 12/07/2015 1052   MCHC 33.8 12/07/2015 1052   RDW 12.9 12/07/2015 1052   LYMPHSABS 2.3 12/07/2015 1052   MONOABS 0.9 12/07/2015 1052   EOSABS 0.9* 12/07/2015 1052   BASOSABS 0.1 12/07/2015 1052      Chemistry      Component Value Date/Time   NA 138 12/07/2015 1052   K 3.7 12/07/2015 1052   CL 109 12/07/2015 1052   CO2 25 12/07/2015 1052   BUN 15 12/07/2015 1052   CREATININE 0.95 12/07/2015 1052   CREATININE 0.95 11/03/2015 1452      Component Value Date/Time   CALCIUM 8.4* 12/07/2015 1052   ALKPHOS 64 12/07/2015 1052   AST 22 12/07/2015 1052   ALT 18 12/07/2015 1052   BILITOT 0.7 12/07/2015 1052       Lab Results  Component Value Date   IRON 71 12/07/2015   TIBC 302 12/07/2015   FERRITIN 40 12/07/2015   Lab Results  Component Value Date   VITAMINB12 170* 12/07/2015   Lab Results  Component Value Date   FOLATE 39.9 12/07/2015     RADIOGRAPHY: CLINICAL DATA: Initial treatment strategy for lung lesion.  EXAM: NUCLEAR MEDICINE PET SKULL BASE TO THIGH  TECHNIQUE: 10.1 mCi F-18 FDG was injected intravenously. Full-ring PET imaging was performed from the skull base to thigh after the radiotracer. CT data was obtained and used for attenuation correction and anatomic localization.  FASTING BLOOD GLUCOSE: Value: 155 mg/dl  COMPARISON: CT abdomen pelvis 11/10/2015.  FINDINGS: NECK  No hypermetabolic lymph nodes in the neck. CT images show no acute findings.  CHEST  Spiculated 1.4 x 1.8 cm right middle lobe nodule has an SUV max of 3.1. No hypermetabolic mediastinal, hilar or axillary lymph nodes. CT images show aortic atherosclerosis and coronary artery calcification. Heart is at the upper limits of normal in size. No pericardial or pleural effusion. Centrilobular emphysema.  ABDOMEN/PELVIS  There is mild hypermetabolism within a right adrenal nodule, which measures fluid in density on CT and measures 16 mm. No abnormal hypermetabolism in the liver, left adrenal gland, spleen or pancreas. No hypermetabolic lymph nodes. Liver is unremarkable. Cholecystectomy. Adrenal glands and right kidney are otherwise unremarkable. Low-attenuation lesions off the left kidney measure up to 4.6 cm and are likely cysts when compared with 11/10/2015. Spleen, pancreas, stomach and bowel are grossly unremarkable. Prostate is at the upper limits of normal. Bladder wall thickening is likely due to underdistention. A degree of bladder outlet obstruction cannot be excluded. Atherosclerotic calcification of the arterial vasculature without abdominal aortic aneurysm. 3.1  cm low-attenuation lesion overlying the upper right paraspinal musculature is indicative of a a sebaceous cyst.  SKELETON  No abnormal osseous hypermetabolism. Postoperative changes in the lumbar spine.  IMPRESSION: 1. Spiculated right middle lobe nodule is mildly hypermetabolic and most consistent with a stage IA adenocarcinoma. 2. Mildly hypermetabolic right adrenal nodule has CT attenuation characteristics consistent with an adenoma. 3. Aortic atherosclerosis.   Electronically Signed  By: Lorin Picket M.D.  On: 11/30/2015 11:54    PATHOLOGY:   Diagnosis Colon, polyp(s), ascending - TUBULAR ADENOMA(S). - HIGH GRADE DYSPLASIA IS NOT IDENTIFIED. Casimer Lanius MD Pathologist, Electronic Signature (Case signed 11/25/2015)   ASSESSMENT/PLAN:  RML nodule Lung cancer ([place])  18 mm RML spiculated nodule that is mildly hypermetabolic on PET imaging on 11/30/2015 consistent with a Stage IA primary bronchogenic carcinoma.  I personally reviewed and went over radiographic studies with the patient.  I have reviewed the patient's CT and PET imaging with he and his wife.  Both demonstrate a RML pulmonary lesion and PET demonstrates mild hypermetabolic activity.  PFTs were ordered and completed on 12/07/2015 in preparation for surgical clearance.  We discussed options with regards to management.  We reviewed the risks and benefits, alternatives, side effects, pros and cons of SBRT versus surgical intervention.  He is a good surgical candidate.  After a long discussion, he has opted for surgical management.  As a result, we will refer him to CTS and I have messaged Dr. Roxan Hockey.    Return in 4-6 weeks for review of pathology and discussion regarding NCCN guidelines surveillance recommendations vs. Additional therapy if necessary.  B12 deficiency B12 deficiency requiring IM B12 replacement.  Orders placed for: Intrinsic factor antibody, anti-parietal cell antibody.  B12  replacement supportive therapy plan built.  B12 injection today and then weekly for a total of 4 doses, followed by monthly injections depending on antibody testing. If antibody testing negative will try a trial of oral B12 moving forward.   MEDICATIONS PRESCRIBED THIS ENCOUNTER: Meds ordered this encounter  Medications  . cyanocobalamin ((VITAMIN B-12)) injection 1,000 mcg    Sig:     All questions were answered. The patient knows to call the clinic with any problems, questions or concerns. We can certainly see the patient much sooner if necessary.  This note is electronically signed [id number] Cyril Mourning, MD  12/11/2015 9:22 AM

## 2015-12-11 NOTE — Assessment & Plan Note (Addendum)
18 mm RML spiculated nodule that is mildly hypermetabolic on PET imaging on 11/30/2015 consistent with a Stage IA primary bronchogenic carcinoma.  I personally reviewed and went over radiographic studies with the patient.  The results are noted within this dictation.  I have reviewed the patient's CT and PET imaging with he and his wife.  Both demonstrate a RML pulmonary lesion and PET demonstrates mild hypermetabolic activity.  PFTs were ordered and completed on 12/07/2015 in preparation for surgical clearance.  We discussed options with regards to management.  We reviewed the risks and benefits, alternatives, side effects, pros and cons of SBRT versus surgical intervention.  He is a good surgical candidate.  After a long discussion, he has opted for surgical management.  As a result, we will refer him to CTS and I have messaged Dr. Roxan Hockey.    Return in 4-6 weeks for review of pathology and discussion regarding NCCN guidelines surveillance recommendations.

## 2015-12-11 NOTE — Assessment & Plan Note (Signed)
B12 deficiency requiring IM B12 replacement.  Orders placed for: Intrinsic factor antibody, anti-parietal cell antibody, MMA, and homocysteine.  B12 replacement supportive therapy plan built.  B12 injection today and then weekly for a total of 4 doses, followed by monthly injections depending on antibody testing.

## 2015-12-12 LAB — INTRINSIC FACTOR ANTIBODIES: Intrinsic Factor: 1 AU/mL (ref 0.0–1.1)

## 2015-12-12 LAB — METHYLMALONIC ACID, SERUM: Methylmalonic Acid, Quantitative: 383 nmol/L — ABNORMAL HIGH (ref 0–378)

## 2015-12-12 LAB — HOMOCYSTEINE: Homocysteine: 13.9 umol/L (ref 0.0–15.0)

## 2015-12-12 LAB — ANTI-PARIETAL ANTIBODY: Parietal Cell Antibody-IgG: 6.7 Units (ref 0.0–20.0)

## 2015-12-14 ENCOUNTER — Encounter: Payer: Self-pay | Admitting: Thoracic Surgery (Cardiothoracic Vascular Surgery)

## 2015-12-14 ENCOUNTER — Other Ambulatory Visit: Payer: Self-pay | Admitting: *Deleted

## 2015-12-14 ENCOUNTER — Institutional Professional Consult (permissible substitution) (INDEPENDENT_AMBULATORY_CARE_PROVIDER_SITE_OTHER): Payer: Medicare Other | Admitting: Thoracic Surgery (Cardiothoracic Vascular Surgery)

## 2015-12-14 VITALS — BP 126/67 | HR 78 | Resp 20 | Ht 72.0 in | Wt 203.0 lb

## 2015-12-14 DIAGNOSIS — R911 Solitary pulmonary nodule: Secondary | ICD-10-CM

## 2015-12-14 DIAGNOSIS — Z87891 Personal history of nicotine dependence: Secondary | ICD-10-CM | POA: Diagnosis not present

## 2015-12-14 NOTE — Progress Notes (Signed)
PCP is Monico Blitz, MD Referring Provider is Monico Blitz, MD  Chief Complaint  Patient presents with  . Lung Lesion    Surgical eval, PET Scan 11/30/15, PFT's 12/07/15    HPI: 82 year old man sent for consultation regarding a right middle lobe nodule.  [name] is an 82 year old man with a history of tobacco abuse (about one half pack per day for 50 years, quit in 2004), type 2 non-insulin-dependent diabetes with neuropathy, hyperlipidemia, and anemia due to vitamin B 12 deficiency. [name] recently had colonoscopy. As part of his workup [name] had a CT of the abdomen and pelvis. [name] was found to have a spiculated right middle lobe nodule.  A PET/CT showed a 1.4 x 1.8 cm spiculated right middle lobe nodule with hypermetabolic uptake with an SUV of 3.1. There was a mildly hypermetabolic adrenal nodule consistent with an adenoma. There was no evidence of regional or distant metastatic disease.  As noted [name] quit smoking in 2004. [name] has occasional bronchitis but otherwise has not had any problems with his breathing. [name] walks as much as 5 miles a day without shortness of breath. [name] is not having any chest pain, pressure, or tightness. [name] denies wheezing or hemoptysis. [name] has an occasional cough, but nothing regular. [name] has not had any change in appetite or significant weight loss. [name] has noted decreased energy. [name] was recently diagnosed with anemia due to vitamin B 12 deficiency and has been receiving B-12 shots.  Zubrod Score: At the time of surgery this patient's most appropriate activity status/level should be described as: '[x]'$     0    Normal activity, no symptoms '[]'$     1    Restricted in physical strenuous activity but ambulatory, able to do out light work '[]'$     2    Ambulatory and capable of self care, unable to do work activities, up and about >50 % of waking hours                              '[]'$     3    Only limited self care, in bed greater than 50% of waking hours '[]'$     4    Completely disabled, no  self care, confined to bed or chair '[]'$     5    Moribund  Past Medical History  Diagnosis Date  . Hernia   . Diabetes mellitus   . Cyst of scrotum   . Hyperlipidemia   . Adenocarcinoma, lung ([place])   . Arthritis   . Lung cancer ([place]) 12/02/2015  . B12 deficiency 12/08/2015    Past Surgical History  Procedure Laterality Date  . Back surgery    . Cholecystectomy    . Colonoscopy N/A 11/24/2015    Procedure: COLONOSCOPY;  Surgeon: Rogene Houston, MD;  Location: [facility];  Service: Endoscopy;  Laterality: N/A;  730  . Lumbar disc surgery      per patient report    Family History  Problem Relation Age of Onset  . Cancer Brother     colon CA    Social History Social History  Substance Use Topics  . Smoking status: Former Smoker    Quit date: 05/27/2004  . Smokeless tobacco: None  . Alcohol Use: No    Current Outpatient Prescriptions  Medication Sig Dispense Refill  . aspirin EC 81 MG tablet Take 81 mg by mouth daily.    . Cholecalciferol (VITAMIN  D) 2000 UNITS CAPS Take 1 capsule by mouth daily.     Marland Kitchen glipiZIDE (GLUCOTROL) 5 MG tablet Take 2.5 mg by mouth 2 (two) times daily before a meal.    . lisinopril (PRINIVIL,ZESTRIL) 10 MG tablet Take 5 mg by mouth daily.    Marland Kitchen loratadine (CLARITIN) 10 MG tablet Take 10 mg by mouth daily.    . meclizine (ANTIVERT) 25 MG tablet Take 25 mg by mouth daily as needed for dizziness.    . pantoprazole (PROTONIX) 40 MG tablet Take 40 mg by mouth 2 (two) times daily.     . polyethylene glycol (MIRALAX / GLYCOLAX) packet Take 17 g by mouth daily.    . simvastatin (ZOCOR) 80 MG tablet Take 40 mg by mouth at bedtime.     Marland Kitchen terazosin (HYTRIN) 5 MG capsule Take 5 mg by mouth at bedtime.     No current facility-administered medications for this visit.    No Known Allergies  Review of Systems  Constitutional: Negative for activity change, appetite change and unexpected weight change.  HENT: Positive for dental problem (dentures) and  hearing loss. Negative for trouble swallowing and voice change.   Eyes: Positive for visual disturbance (catarracts- surgery 2016).  Respiratory: Positive for cough (not at present). Negative for chest tightness, shortness of breath and wheezing.   Cardiovascular: Positive for leg swelling. Negative for chest pain and palpitations.  Gastrointestinal: Positive for abdominal pain. Negative for blood in stool.       4 polyps removed in June  Genitourinary: Positive for urgency and frequency.       BPH  Musculoskeletal: Positive for back pain, joint swelling and arthralgias.  Neurological: Positive for dizziness.       Neuropathy in feet- paresthesias  Hematological: Negative for adenopathy. Bruises/bleeds easily.    BP 126/67 mmHg  Pulse 78  Resp 20  Ht 6' (1.829 m)  Wt 203 lb (92.08 kg)  BMI 27.53 kg/m2  SpO2 96% Physical Exam  Constitutional: [name] is oriented to person, place, and time. [name] appears well-developed and well-nourished. No distress.  HENT:  Head: Normocephalic and atraumatic.  Mouth/Throat: No oropharyngeal exudate.  Eyes: Conjunctivae and EOM are normal. No scleral icterus.  Neck: Neck supple. No thyromegaly present.  Cardiovascular: Normal rate, regular rhythm and normal heart sounds.  Exam reveals no gallop and no friction rub.   No murmur heard. Pulmonary/Chest: Effort normal. No respiratory distress. [name] has no wheezes. [name] has no rales.  Abdominal: Soft. [name] exhibits no distension. There is no tenderness.  Musculoskeletal: Normal range of motion. [name] exhibits no edema.  Lymphadenopathy:    [name] has no cervical adenopathy.  Neurological: [name] is alert and oriented to person, place, and time. No cranial nerve deficit.  Motor intact  Skin: Skin is warm and dry.  Vitals reviewed.    Diagnostic Tests: NUCLEAR MEDICINE PET SKULL BASE TO THIGH  TECHNIQUE: 10.1 mCi F-18 FDG was injected intravenously. Full-ring PET imaging was performed from the skull base to thigh  after the radiotracer. CT data was obtained and used for attenuation correction and anatomic localization.  FASTING BLOOD GLUCOSE: Value: 155 mg/dl  COMPARISON: CT abdomen pelvis 11/10/2015.  FINDINGS: NECK  No hypermetabolic lymph nodes in the neck. CT images show no acute findings.  CHEST  Spiculated 1.4 x 1.8 cm right middle lobe nodule has an SUV max of 3.1. No hypermetabolic mediastinal, hilar or axillary lymph nodes. CT images show aortic atherosclerosis and coronary artery calcification. Heart is at  the upper limits of normal in size. No pericardial or pleural effusion. Centrilobular emphysema.  ABDOMEN/PELVIS  There is mild hypermetabolism within a right adrenal nodule, which measures fluid in density on CT and measures 16 mm. No abnormal hypermetabolism in the liver, left adrenal gland, spleen or pancreas. No hypermetabolic lymph nodes. Liver is unremarkable. Cholecystectomy. Adrenal glands and right kidney are otherwise unremarkable. Low-attenuation lesions off the left kidney measure up to 4.6 cm and are likely cysts when compared with 11/10/2015. Spleen, pancreas, stomach and bowel are grossly unremarkable. Prostate is at the upper limits of normal. Bladder wall thickening is likely due to underdistention. A degree of bladder outlet obstruction cannot be excluded. Atherosclerotic calcification of the arterial vasculature without abdominal aortic aneurysm. 3.1 cm low-attenuation lesion overlying the upper right paraspinal musculature is indicative of a a sebaceous cyst.  SKELETON  No abnormal osseous hypermetabolism. Postoperative changes in the lumbar spine.  IMPRESSION: 1. Spiculated right middle lobe nodule is mildly hypermetabolic and most consistent with a stage IA adenocarcinoma. 2. Mildly hypermetabolic right adrenal nodule has CT attenuation characteristics consistent with an adenoma. 3. Aortic atherosclerosis.   Electronically  Signed  By: Lorin Picket M.D.  On: 11/30/2015 11:54  FVC= 3.45 (81%) FEV1= 2.00 (66%), 2.31 (77%) postbronchodilator DLCO= 12.40 (35%)  I personally reviewed the PET/CT confirmed with findings noted above.  Impression: 82 year old gentleman with a history of tobacco abuse who has an incidentally discovered spiculated right middle lobe nodule was hypermetabolic on PET/CT. This has an extremely high likelihood of being a non-small cell carcinoma. Granulomatous disease is in the differential, but odds are its cancers.  I reviewed the PET CT images with [name]. We discussed potential diagnostic and treatment options. We discussed CT-guided needle biopsy, bronchscopic biopsy, SBRT, and surgery. We also discussed noninvasive radiographic follow-up. We discussed the relative advantages and disadvantages of each of the approaches. I recommended that we proceed with a right VATS and right middle lobectomy for diagnostic and therapeutic purposes.   I discussed with the patient the general nature of the procedure, the need for general anesthesia, the incisions to be used, and the use of drainage tubes postoperatively. We discussed the expected hospital stay, overall recovery and short and long term outcomes. I reviewed the indications, risks, benefits, and alternatives. They understand the risks include but are not limited to death, stroke, MI, DVT/PE, bleeding, possible need for transfusion, infections, prolonged air leak, cardiac arrhythmias, and other organ system dysfunction including respiratory, renal, or GI complications.   [name] accepts the risks and wishes to proceed with surgery.  Plan:  Right VATS, right middle lobectomy on Thursday, 12/22/2015.  Melrose Nakayama, MD [facility] [phone]

## 2015-12-16 ENCOUNTER — Encounter (HOSPITAL_COMMUNITY): Payer: Medicare Other | Attending: Hematology & Oncology

## 2015-12-16 ENCOUNTER — Encounter (HOSPITAL_COMMUNITY): Payer: Self-pay

## 2015-12-16 VITALS — BP 143/69 | HR 69 | Temp 98.3°F | Resp 20

## 2015-12-16 DIAGNOSIS — D649 Anemia, unspecified: Secondary | ICD-10-CM | POA: Insufficient documentation

## 2015-12-16 DIAGNOSIS — R918 Other nonspecific abnormal finding of lung field: Secondary | ICD-10-CM | POA: Insufficient documentation

## 2015-12-16 DIAGNOSIS — E538 Deficiency of other specified B group vitamins: Secondary | ICD-10-CM | POA: Diagnosis present

## 2015-12-16 MED ORDER — CYANOCOBALAMIN 1000 MCG/ML IJ SOLN
1000.0000 ug | Freq: Once | INTRAMUSCULAR | Status: AC
  Administered 2015-12-16: 1000 ug via INTRAMUSCULAR
  Filled 2015-12-16: qty 1

## 2015-12-16 NOTE — Patient Instructions (Signed)
[facility] at [facility] Discharge Instructions  RECOMMENDATIONS MADE BY THE CONSULTANT AND ANY TEST RESULTS WILL BE SENT TO YOUR REFERRING PHYSICIAN.  B12 injection today. Return as scheduled for injections. Return as scheduled for lab work and office visit.  Thank you for choosing [facility] at [facility] to provide your oncology and hematology care.  To afford each patient quality time with our provider, please arrive at least 15 minutes before your scheduled appointment time.   Beginning January 23rd 2017 lab work for the Cancer Center will be done in the  Main lab at [place] on 1st floor. If you have a lab appointment with the Cancer Center please come in thru the  Main Entrance and check in at the main information desk  You need to re-schedule your appointment should you arrive 10 or more minutes late.  We strive to give you quality time with our providers, and arriving late affects you and other patients whose appointments are after yours.  Also, if you no show three or more times for appointments you may be dismissed from the clinic at the providers discretion.     Again, thank you for choosing [facility].  Our hope is that these requests will decrease the amount of time that you wait before being seen by our physicians.       _____________________________________________________________  Should you have questions after your visit to [facility], please contact our office at [phone] between the hours of 8:30 a.m. and 4:30 p.m.  Voicemails left after 4:30 p.m. will not be returned until the following business day.  For prescription refill requests, have your pharmacy contact our office.         Resources For Cancer Patients and their Caregivers ? American Cancer Society: Can assist with transportation, wigs, general needs, runs Look Good Feel Better.        1-888-227-6333 ? Cancer  Care: Provides financial assistance, online support groups, medication/co-pay assistance.  1-800-813-HOPE (4673) ? [facility] Assists [place] cancer patients and their families through emotional , educational and financial support.  336-427-4357 ? [place] DSS Where to apply for food stamps, Medicaid and utility assistance. 336-342-1394 ? RCATS: Transportation to medical appointments. 336-347-2287 ? Social Security Administration: May apply for disability if have a Stage IV cancer. 336-342-7796 1-800-772-1213 ? [place] Aging, Disability and Transit Services: Assists with nutrition, care and transit needs. 336-349-2343  Cancer Center Support Programs: @10RELATIVEDAYS@ > Cancer Support Group  2nd Tuesday of the month 1pm-2pm, Journey Room  > Creative Journey  3rd Tuesday of the month 1130am-1pm, Journey Room  > Look Good Feel Better  1st Wednesday of the month 10am-12 noon, Journey Room (Call American Cancer Society to register 1-800-395-5775)   

## 2015-12-16 NOTE — Progress Notes (Signed)
[name] presents today for injection per the provider's orders.  B12 administration without incident; see MAR for injection details.  Patient tolerated procedure well and without incident.  No questions or complaints noted at this time.

## 2015-12-20 ENCOUNTER — Encounter (HOSPITAL_COMMUNITY)
Admission: RE | Admit: 2015-12-20 | Discharge: 2015-12-20 | Disposition: A | Discharge status: Home / Self Care | Payer: Medicare Other | Source: Ambulatory Visit | Attending: Thoracic Surgery (Cardiothoracic Vascular Surgery) | Admitting: Thoracic Surgery (Cardiothoracic Vascular Surgery)

## 2015-12-20 ENCOUNTER — Other Ambulatory Visit: Payer: Self-pay

## 2015-12-20 ENCOUNTER — Ambulatory Visit (HOSPITAL_COMMUNITY)
Admission: RE | Admit: 2015-12-20 | Discharge: 2015-12-20 | Disposition: A | Discharge status: Home / Self Care | Payer: Medicare Other | Source: Ambulatory Visit | Attending: Thoracic Surgery (Cardiothoracic Vascular Surgery) | Admitting: Thoracic Surgery (Cardiothoracic Vascular Surgery)

## 2015-12-20 ENCOUNTER — Encounter (HOSPITAL_COMMUNITY): Payer: Self-pay

## 2015-12-20 VITALS — BP 132/46 | HR 80 | Temp 98.1°F | Resp 20 | Ht 69.5 in | Wt 203.0 lb

## 2015-12-20 DIAGNOSIS — Z01812 Encounter for preprocedural laboratory examination: Secondary | ICD-10-CM | POA: Insufficient documentation

## 2015-12-20 DIAGNOSIS — R911 Solitary pulmonary nodule: Secondary | ICD-10-CM

## 2015-12-20 DIAGNOSIS — Z01818 Encounter for other preprocedural examination: Secondary | ICD-10-CM | POA: Insufficient documentation

## 2015-12-20 DIAGNOSIS — J449 Chronic obstructive pulmonary disease, unspecified: Secondary | ICD-10-CM | POA: Insufficient documentation

## 2015-12-20 DIAGNOSIS — Z0183 Encounter for blood typing: Secondary | ICD-10-CM | POA: Diagnosis not present

## 2015-12-20 HISTORY — DX: Gastro-esophageal reflux disease without esophagitis: K21.9

## 2015-12-20 HISTORY — DX: Diverticulitis of intestine, part unspecified, without perforation or abscess without bleeding: K57.92

## 2015-12-20 LAB — CBC
HCT: 41.3 % (ref 39.0–52.0)
Hemoglobin: 13.7 g/dL (ref 13.0–17.0)
MCH: 29.4 pg (ref 26.0–34.0)
MCHC: 33.2 g/dL (ref 30.0–36.0)
MCV: 88.6 fL (ref 78.0–100.0)
Platelets: 266 10*3/uL (ref 150–400)
RBC: 4.66 MIL/uL (ref 4.22–5.81)
RDW: 13.1 % (ref 11.5–15.5)
WBC: 10.9 10*3/uL — ABNORMAL HIGH (ref 4.0–10.5)

## 2015-12-20 LAB — BLOOD GAS, ARTERIAL
Acid-base deficit: 0.4 mmol/L (ref 0.0–2.0)
Bicarbonate: 23.5 mEq/L (ref 20.0–24.0)
Drawn by: 206361
FIO2: 0.21
O2 Saturation: 96.7 %
Patient temperature: 98.6
TCO2: 24.6 mmol/L (ref 0–100)
pCO2 arterial: 36.7 mmHg (ref 35.0–45.0)
pH, Arterial: 7.422 (ref 7.350–7.450)
pO2, Arterial: 88.8 mmHg (ref 80.0–100.0)

## 2015-12-20 LAB — COMPREHENSIVE METABOLIC PANEL
ALT: 20 U/L (ref 17–63)
AST: 24 U/L (ref 15–41)
Albumin: 3.6 g/dL (ref 3.5–5.0)
Alkaline Phosphatase: 72 U/L (ref 38–126)
Anion gap: 8 (ref 5–15)
BUN: 11 mg/dL (ref 6–20)
CO2: 21 mmol/L — ABNORMAL LOW (ref 22–32)
Calcium: 9.1 mg/dL (ref 8.9–10.3)
Chloride: 110 mmol/L (ref 101–111)
Creatinine, Ser: 0.88 mg/dL (ref 0.61–1.24)
GFR calc Af Amer: >60 mL/min (ref >60)
GFR calc non Af Amer: >60 mL/min (ref >60)
Glucose, Bld: 79 mg/dL (ref 65–99)
Potassium: 3.5 mmol/L (ref 3.5–5.1)
Sodium: 139 mmol/L (ref 135–145)
Total Bilirubin: 0.6 mg/dL (ref 0.3–1.2)
Total Protein: 6.8 g/dL (ref 6.5–8.1)

## 2015-12-20 LAB — APTT: aPTT: 31 seconds (ref 24–37)

## 2015-12-20 LAB — URINE MICROSCOPIC-ADD ON
Bacteria, UA: NONE SEEN
Squamous Epithelial / LPF: NONE SEEN

## 2015-12-20 LAB — URINALYSIS, ROUTINE W REFLEX MICROSCOPIC
Bilirubin Urine: NEGATIVE
Glucose, UA: NEGATIVE mg/dL
Ketones, ur: NEGATIVE mg/dL
Leukocytes, UA: NEGATIVE
Nitrite: NEGATIVE
Protein, ur: NEGATIVE mg/dL
Specific Gravity, Urine: 1.011 (ref 1.005–1.030)
pH: 5.5 (ref 5.0–8.0)

## 2015-12-20 LAB — TYPE AND SCREEN
ABO/RH(D): B POS
Antibody Screen: NEGATIVE

## 2015-12-20 LAB — GLUCOSE, CAPILLARY: Glucose-Capillary: 114 mg/dL — ABNORMAL HIGH (ref 65–99)

## 2015-12-20 LAB — SURGICAL PCR SCREEN
MRSA, PCR: NEGATIVE
Staphylococcus aureus: NEGATIVE

## 2015-12-20 LAB — PROTIME-INR
INR: 1.06 (ref 0.00–1.49)
Prothrombin Time: 14 seconds (ref 11.6–15.2)

## 2015-12-20 NOTE — Pre-Procedure Instructions (Addendum)
[name]  12/20/2015    Your procedure is scheduled on Thursday, July 13.  Report to [facility] Admitting at 6:00 A.M.               Your surgery or procedure is scheduled for 8:00 AM     Call this number if you have problems the morning of surgery:[phone]                For any other questions, please call [phone], Monday - Friday 8 AM - 4 PM.  Remember:  Do not eat food or drink liquids after midnight Wednesday, July 12.    Take these medicines the morning of surgery with A SIP OF WATER : loratadine (CLARITIN),  pantoprazole (PROTONIX).                  Stop taking Vitamins and Aspirin.  How to Manage Your Diabetes Before and After Surgery   What about my medications for DIabetes: Do not take Glipizide the morning of surgery or the evening prior- Wednesday.  Do not take oral diabetes medicines (pills) the morning of surgery.  Why is it important to control my blood sugar before and after surgery? . Improving blood sugar levels before and after surgery helps healing and can limit problems. . A way of improving blood sugar control is eating a healthy diet by: o  Eating less sugar and carbohydrates o  Increasing activity/exercise o  Talking with your doctor about reaching your blood sugar goals . High blood sugars (greater than 180 mg/dL) can raise your risk of infections and slow your recovery, so you will need to focus on controlling your diabetes during the weeks before surgery. . Make sure that the doctor who takes care of your diabetes knows about your planned surgery including the date and location.  How do I manage my blood sugar before surgery? . Check your blood sugar at least 4 times a day, starting 2 days before surgery, to make sure that the level is not too high or low. o Check your blood sugar the morning of your surgery when you wake up and every 2 hours until you get to the Short Stay unit. . If your blood sugar is less than 70 mg/dL,  you will need to treat for low blood sugar: o Do not take insulin. o Treat a low blood sugar (less than 70 mg/dL) with  cup of clear juice (cranberry or apple), 4 glucose tablets, OR glucose gel. o Recheck blood sugar in 15 minutes after treatment (to make sure it is greater than 70 mg/dL). If your blood sugar is not greater than 70 mg/dL on recheck, call [phone] for further instructions. . Report your blood sugar to the short stay nurse when you get to Short Stay.  . If you are admitted to the hospital after surgery: o Your blood sugar will be checked by the staff and you will probably be given insulin after surgery (instead of oral diabetes medicines) to make sure you have good blood sugar levels. o The goal for blood sugar control after surgery is 80-180 mg/dL.   Patient Signature:  Date:   Nurse Signature:  Date:    Do not wear jewelry, make-up or nail polish.  Do not wear lotions, powders, or perfumes.  .             Men may shave face and neck.  Do not bring valuables to the hospital.  [facility] is not responsible for any belongings or valuables.  Contacts, dentures or bridgework may not be worn into surgery.  Leave your suitcase in the car.  After surgery it may be brought to your room.  For patients admitted to the hospital, discharge time will be determined by your treatment team.  Special instructions:  Review  [facility] - Preparing For Surgery.  Please read over the following fact sheets that you were given. [facility]- Preparing For Surgery and Patient Instructions for Mupirocin Application

## 2015-12-20 NOTE — Progress Notes (Signed)
Mr Bastin denies chest pain or shortness of breath or having a cardiologist.  PCP is Dr A. Manuella Ghazi at [facility].  I called [facility] and was told that their fax is not working.  I spoke with Pam at [facility] and asked for last office notes, EKG and Labs.

## 2015-12-21 ENCOUNTER — Encounter (HOSPITAL_COMMUNITY): Payer: Self-pay | Admitting: Certified Registered Nurse Anesthetist

## 2015-12-21 ENCOUNTER — Encounter (HOSPITAL_COMMUNITY): Payer: Self-pay | Admitting: Emergency Medicine

## 2015-12-21 ENCOUNTER — Other Ambulatory Visit: Payer: Self-pay | Admitting: *Deleted

## 2015-12-21 LAB — HEMOGLOBIN A1C
Hgb A1c MFr Bld: 7.3 % — ABNORMAL HIGH (ref 4.8–5.6)
Mean Plasma Glucose: 163 mg/dL

## 2015-12-21 MED ORDER — DEXTROSE 5 % IV SOLN: 1.5000 g | INTRAVENOUS | Status: DC

## 2015-12-21 NOTE — Progress Notes (Signed)
Anesthesia Chart Review:  Pt is an 82 year old male scheduled for VATS, R middle lobectomy on 12/22/2015 with Modesto Charon, MD.   PMH includes:  DM, hyperlipidemia, GERD. Former smoker. BMI 29.5  Medications include: ASA, glipizide, lisinopril, protonix, simvastatin, terazosin  Preoperative labs reviewed.  HgbA1c 7.3, glucose 79  Chest x-ray 12/20/15 reviewed. COPD. No pneumonia nor CHF nor radiographic evidence of other acute cardiopulmonary abnormality.  EKG 12/20/15: NSR. Left axis deviation. ST & T wave abnormality, consider lateral ischemia  I cannot locate an old EKG for comparison.   Reviewed case with Dr. Conrad Glenbrook. Pt will need cardiac evaluation prior to surgery for abnormal EKG. I notified Ryan in Dr. Leonarda Salon office.   Willeen Cass, FNP-BC [facility] Short Stay Surgical Center/Anesthesiology Phone: [phone] 12/21/2015 11:28 AM

## 2015-12-22 ENCOUNTER — Encounter (HOSPITAL_BASED_OUTPATIENT_CLINIC_OR_DEPARTMENT_OTHER): Payer: Medicare Other

## 2015-12-22 ENCOUNTER — Encounter (HOSPITAL_COMMUNITY): Admission: RE | Payer: Self-pay | Source: Ambulatory Visit

## 2015-12-22 ENCOUNTER — Inpatient Hospital Stay (HOSPITAL_COMMUNITY)
Admission: RE | Admit: 2015-12-22 | Payer: Medicare Other | Source: Ambulatory Visit | Admitting: Thoracic Surgery (Cardiothoracic Vascular Surgery)

## 2015-12-22 VITALS — BP 137/81 | HR 84 | Resp 16

## 2015-12-22 DIAGNOSIS — E538 Deficiency of other specified B group vitamins: Secondary | ICD-10-CM

## 2015-12-22 SURGERY — VIDEO ASSISTED THORACOSCOPY (VATS)/ LOBECTOMY
Anesthesia: General | Site: Chest | Laterality: Right

## 2015-12-22 MED ORDER — CYANOCOBALAMIN 1000 MCG/ML IJ SOLN: 1000.0000 ug | Freq: Once | INTRAMUSCULAR | Status: DC

## 2015-12-22 MED ORDER — CYANOCOBALAMIN 1000 MCG/ML IJ SOLN
1000.0000 ug | Freq: Once | INTRAMUSCULAR | Status: AC
  Administered 2015-12-22: 1000 ug via INTRAMUSCULAR

## 2015-12-22 NOTE — Patient Instructions (Signed)
[place] at [facility] Discharge Instructions  RECOMMENDATIONS MADE BY THE CONSULTANT AND ANY TEST RESULTS WILL BE SENT TO YOUR REFERRING PHYSICIAN.  B12 injection given today. Follow up as scheduled Call for any concerns or questions  Thank you for choosing [place] at [facility] to provide your oncology and hematology care.  To afford each patient quality time with our provider, please arrive at least 15 minutes before your scheduled appointment time.   Beginning January 23rd 2017 lab work for the [facility] will be done in the  Main lab at [facility] on 1st floor. If you have a lab appointment with the [place] please come in thru the  Main Entrance and check in at the main information desk  You need to re-schedule your appointment should you arrive 10 or more minutes late.  We strive to give you quality time with our providers, and arriving late affects you and other patients whose appointments are after yours.  Also, if you no show three or more times for appointments you may be dismissed from the clinic at the providers discretion.     Again, thank you for choosing [facility].  Our hope is that these requests will decrease the amount of time that you wait before being seen by our physicians.       _____________________________________________________________  Should you have questions after your visit to [facility], please contact our office at [phone] between the hours of 8:30 a.m. and 4:30 p.m.  Voicemails left after 4:30 p.m. will not be returned until the following business day.  For prescription refill requests, have your pharmacy contact our office.         Resources For Cancer Patients and their Caregivers ? American Cancer Society: Can assist with transportation, wigs, general needs, runs Look Good Feel Better.        (830)214-1115 ? Cancer Care: Provides financial assistance,  online support groups, medication/co-pay assistance.  1-800-813-HOPE (703)469-2377) ? [facility] Assists [place] cancer patients and their families through emotional , educational and financial support.  (575)047-2294 ? [place] DSS Where to apply for food stamps, Medicaid and utility assistance. 581-534-6169 ? RCATS: Transportation to medical appointments. 725-601-2495 ? Social Security Administration: May apply for disability if have a Stage IV cancer. 618-722-8379 (240) 809-0900 ? [employer], Disability and Transit Services: Assists with nutrition, care and transit needs. [place] Support Programs: '@10RELATIVEDAYS'$ @ > Cancer Support Group  2nd Tuesday of the month 1pm-2pm, Journey Room  > Creative Journey  3rd Tuesday of the month 1130am-1pm, Journey Room  > Look Good Feel Better  1st Wednesday of the month 10am-12 noon, Journey Room (Call [place] to register [phone])

## 2015-12-22 NOTE — Progress Notes (Signed)
[name] presents today for injection per MD orders. B12 1,000 administered IM in left Upper Arm. Administration without incident. Patient tolerated well.

## 2015-12-23 ENCOUNTER — Encounter: Payer: Self-pay | Admitting: Cardiology

## 2015-12-23 ENCOUNTER — Ambulatory Visit (INDEPENDENT_AMBULATORY_CARE_PROVIDER_SITE_OTHER): Payer: Medicare Other | Admitting: Cardiology

## 2015-12-23 ENCOUNTER — Ambulatory Visit (HOSPITAL_COMMUNITY): Payer: Medicare Other

## 2015-12-23 ENCOUNTER — Encounter: Payer: Self-pay | Admitting: *Deleted

## 2015-12-23 VITALS — BP 146/70 | HR 68 | Ht 69.0 in | Wt 205.2 lb

## 2015-12-23 DIAGNOSIS — Z01810 Encounter for preprocedural cardiovascular examination: Secondary | ICD-10-CM

## 2015-12-23 DIAGNOSIS — I2510 Atherosclerotic heart disease of native coronary artery without angina pectoris: Secondary | ICD-10-CM

## 2015-12-23 DIAGNOSIS — E119 Type 2 diabetes mellitus without complications: Secondary | ICD-10-CM

## 2015-12-23 DIAGNOSIS — R9431 Abnormal electrocardiogram [ECG] [EKG]: Secondary | ICD-10-CM

## 2015-12-23 DIAGNOSIS — E785 Hyperlipidemia, unspecified: Secondary | ICD-10-CM | POA: Diagnosis not present

## 2015-12-23 DIAGNOSIS — R911 Solitary pulmonary nodule: Secondary | ICD-10-CM

## 2015-12-23 DIAGNOSIS — Z794 Long term (current) use of insulin: Secondary | ICD-10-CM

## 2015-12-23 NOTE — Patient Instructions (Signed)
Medication Instructions:  Your physician recommends that you continue on your current medications as directed. Please refer to the Current Medication list given to you today.  Labwork: NONE  Testing/Procedures: Your physician has requested that you have an echocardiogram. Echocardiography is a painless test that uses sound waves to create images of your heart. It provides your doctor with information about the size and shape of your heart and how well your heart's chambers and valves are working. This procedure takes approximately one hour. There are no restrictions for this procedure. Your physician has requested that you have a lexiscan myoview. For further information please visit [email or web address]. Please follow instruction sheet, as given. We will call you with these results.  Follow-Up:  Pending test results.  Any Other Special Instructions Will Be Listed Below (If Applicable).  If you need a refill on your cardiac medications before your next appointment, please call your pharmacy.

## 2015-12-23 NOTE — Progress Notes (Signed)
Cardiology Office Note  Date: 12/23/2015   ID: [name], DOB [birth year], MRN [id number]  PCP: Gary Blitz, MD  Referring provider: Dr. Modesto [name] Primary Cardiologist: Gary Lesches, MD   Chief Complaint  Patient presents with  . Preoperative evaluation    History of Present Illness: [name] is an 82 y.o. male referred for cardiology consultation by [name]. He has been diagnosed with a spiculated right middle lobe nodule that was found to be hypermetabolic by PET scan with high likelihood of malignancy. Plan is for him to undergo VATS with right middle lobectomy, he has been seen for pre-anesthesia evaluation with recommendation for cardiology consultation due to an normal ECG.  He presents today with his wife. He does not report any known history of obstructive CAD or myocardial infarction. He walks indoors at [facility] for exercise, reports NYHA class II dyspnea in general, no exertional chest pain. He has not undergone any objective ischemic testing or evaluation of LVEF.  He does have documented atherosclerosis with incidental findings of aortic and coronary atherosclerosis by recent CT imaging. Also has history of diabetes mellitus and hyperlipidemia, at least over the last 10 years.  I reviewed his recent ECG as described below.  Past Medical History  Diagnosis Date  . Hernia   . Cyst of scrotum   . Hyperlipidemia   . Lung nodule     Spiculated right middle lobe nodule, hypermetabolic  . Arthritis   . B12 deficiency   . Diverticulitis   . History of pneumonia   . Type 2 diabetes mellitus ([place])   . GERD (gastroesophageal reflux disease)     Past Surgical History  Procedure Laterality Date  . Back surgery    . Cholecystectomy    . Colonoscopy N/A 11/24/2015    Procedure: COLONOSCOPY;  Surgeon: Gary Houston, MD;  Location: [facility];  Service: Endoscopy;  Laterality: N/A;  730  . Lumbar disc surgery      per patient report  .  Cyst excision      Scrotum  . Hernia repair Right     Inguinal  . Colonoscopy w/ polypectomy      x 5  . Eye surgery Bilateral     Cataract with Lens    Current Outpatient Prescriptions  Medication Sig Dispense Refill  . aspirin EC 81 MG tablet Take 81 mg by mouth daily.    . Cholecalciferol (VITAMIN D) 2000 UNITS CAPS Take 1 capsule by mouth daily.     Marland Kitchen glipiZIDE (GLUCOTROL) 5 MG tablet Take 2.5 mg by mouth 2 (two) times daily before a meal.    . lisinopril (PRINIVIL,ZESTRIL) 10 MG tablet Take 5 mg by mouth daily.    Marland Kitchen loratadine (CLARITIN) 10 MG tablet Take 10 mg by mouth daily.    . meclizine (ANTIVERT) 25 MG tablet Take 25 mg by mouth daily as needed for dizziness.    . pantoprazole (PROTONIX) 40 MG tablet Take 40 mg by mouth daily.     . polyethylene glycol (MIRALAX / GLYCOLAX) packet Take 17 g by mouth daily.    . psyllium (METAMUCIL) 58.6 % powder Take 1 packet by mouth daily.    . simvastatin (ZOCOR) 80 MG tablet Take 40 mg by mouth at bedtime.     Marland Kitchen terazosin (HYTRIN) 5 MG capsule Take 5 mg by mouth at bedtime.    . vitamin B-12 (CYANOCOBALAMIN) 1000 MCG tablet Take 1,000 mcg by mouth every Friday. X 5 weeks  12/20/15- has had 2     No current facility-administered medications for this visit.   Allergies:  Morphine and related   Social History: The patient  reports that he quit smoking about 11 years ago. His smoking use included Cigarettes. He does not have any smokeless tobacco history on file. He reports that he does not drink alcohol or use illicit drugs.   Family History: The patient's family history includes Colon cancer in his brother.   ROS:  Please see the history of present illness. Otherwise, complete review of systems is positive for occasional dizziness, prostatic symptoms.  All other systems are reviewed and negative.   Physical Exam: VS:  BP 146/70 mmHg  Pulse 68  Ht '5\' 9"'$  (1.753 m)  Wt 205 lb 3.2 oz (93.078 kg)  BMI 30.29 kg/m2  SpO2 95%, BMI Body  mass index is 30.29 kg/(m^2).  Wt Readings from Last 3 Encounters:  12/23/15 205 lb 3.2 oz (93.078 kg)  12/20/15 203 lb (92.08 kg)  12/14/15 203 lb (92.08 kg)    General: Elderly male, appears comfortable at rest. HEENT: Conjunctiva and lids normal, oropharynx clear. Neck: Supple, no elevated JVP or carotid bruits, no thyromegaly. Lungs: Clear to auscultation, nonlabored breathing at rest. Cardiac: Regular rate and rhythm, S4, soft systolic murmur, no pericardial rub. Abdomen: Soft, nontender, bowel sounds present, no guarding or rebound. Extremities: No pitting edema, distal pulses 2+. Skin: Warm and dry. Musculoskeletal: No kyphosis. Neuropsychiatric: Alert and oriented x3, affect grossly appropriate.  ECG: I personally reviewed the tracing from 12/20/2015 which showed sinus rhythm with leftward axis and diffuse nonspecific ST-T changes.  Recent Labwork: 12/20/2015: ALT 20; AST 24; BUN 11; Creatinine, Ser 0.88; Hemoglobin 13.7; Platelets 266; Potassium 3.5; Sodium 139   Other Studies Reviewed Today:  Chest x-ray 12/20/2015: FINDINGS: The lungs are adequately inflated. A known spiculated right middle lobe nodule is not clearly evident on this plain radiographic series. There is no alveolar infiltrate. The heart and pulmonary vascularity are normal. There is calcification in the wall of the aortic arch. There is no pleural effusion. The bony thorax exhibits no acute abnormality.  IMPRESSION: COPD. No pneumonia nor CHF nor radiographic evidence of other acute cardiopulmonary abnormality.  Assessment and Plan:  1. Preoperative evaluation in an 82 year old male with long-standing history of type 2 diabetes mellitus and hyperlipidemia, documented coronary and aortic atherosclerosis by chest CT imaging, and abnormal ECG showing diffuse nonspecific ST-T changes. He does not endorse any obvious angina and reports NYHA class II dyspnea at this time. VATS with right middle lobe lobectomy  is anticipated. We will proceed with an echocardiogram and Lexiscan Myoview to more objectively assess cardiac structure and ischemic burden. Unless high risk features are uncovered that require further evaluation first, he will more than likely be able to proceed in the near future with surgery.  2. Hyperlipidemia, on Zocor. He follows with Dr. Manuella Ghazi.  3. Hypermetabolic right middle lobe nodule concerning for malignancy. Patient has a prior history of tobacco use. Recent chest x-ray also suggested findings consistent with COPD.  4. Type 2 diabetes mellitus, managed with Glucotrol.  Current medicines were reviewed with the patient today.   Orders Placed This Encounter  Procedures  . NM Myocar Multi W/Spect W/Wall Motion / EF  . ECHOCARDIOGRAM COMPLETE    Disposition: Call with results.  Signed, Satira Sark, MD, [facility] 12/23/2015 3:58 PM    [facility] [facility] [address], [place],  [address] Phone: [phone]; Fax: [phone]

## 2015-12-27 ENCOUNTER — Telehealth: Payer: Self-pay | Admitting: *Deleted

## 2015-12-27 ENCOUNTER — Encounter (HOSPITAL_COMMUNITY)
Admission: RE | Admit: 2015-12-27 | Discharge: 2015-12-27 | Disposition: A | Discharge status: Home / Self Care | Payer: Medicare Other | Source: Ambulatory Visit | Attending: Cardiology | Admitting: Cardiology

## 2015-12-27 ENCOUNTER — Encounter (HOSPITAL_COMMUNITY): Payer: Self-pay

## 2015-12-27 ENCOUNTER — Encounter (HOSPITAL_BASED_OUTPATIENT_CLINIC_OR_DEPARTMENT_OTHER)
Admission: RE | Admit: 2015-12-27 | Discharge: 2015-12-27 | Disposition: A | Discharge status: Home / Self Care | Payer: Medicare Other | Source: Ambulatory Visit | Attending: Cardiology | Admitting: Cardiology

## 2015-12-27 ENCOUNTER — Other Ambulatory Visit: Payer: Self-pay | Admitting: *Deleted

## 2015-12-27 ENCOUNTER — Ambulatory Visit (HOSPITAL_BASED_OUTPATIENT_CLINIC_OR_DEPARTMENT_OTHER)
Admission: RE | Admit: 2015-12-27 | Discharge: 2015-12-27 | Disposition: A | Discharge status: Home / Self Care | Payer: Medicare Other | Source: Ambulatory Visit | Attending: Cardiology | Admitting: Cardiology

## 2015-12-27 DIAGNOSIS — E785 Hyperlipidemia, unspecified: Secondary | ICD-10-CM | POA: Insufficient documentation

## 2015-12-27 DIAGNOSIS — C3490 Malignant neoplasm of unspecified part of unspecified bronchus or lung: Secondary | ICD-10-CM | POA: Insufficient documentation

## 2015-12-27 DIAGNOSIS — E119 Type 2 diabetes mellitus without complications: Secondary | ICD-10-CM

## 2015-12-27 DIAGNOSIS — R9431 Abnormal electrocardiogram [ECG] [EKG]: Secondary | ICD-10-CM | POA: Insufficient documentation

## 2015-12-27 DIAGNOSIS — I2510 Atherosclerotic heart disease of native coronary artery without angina pectoris: Secondary | ICD-10-CM | POA: Insufficient documentation

## 2015-12-27 DIAGNOSIS — I340 Nonrheumatic mitral (valve) insufficiency: Secondary | ICD-10-CM | POA: Insufficient documentation

## 2015-12-27 DIAGNOSIS — Z87891 Personal history of nicotine dependence: Secondary | ICD-10-CM

## 2015-12-27 DIAGNOSIS — I5189 Other ill-defined heart diseases: Secondary | ICD-10-CM

## 2015-12-27 DIAGNOSIS — R911 Solitary pulmonary nodule: Secondary | ICD-10-CM

## 2015-12-27 DIAGNOSIS — I071 Rheumatic tricuspid insufficiency: Secondary | ICD-10-CM | POA: Insufficient documentation

## 2015-12-27 DIAGNOSIS — I77810 Thoracic aortic ectasia: Secondary | ICD-10-CM

## 2015-12-27 DIAGNOSIS — I517 Cardiomegaly: Secondary | ICD-10-CM

## 2015-12-27 DIAGNOSIS — Z01810 Encounter for preprocedural cardiovascular examination: Secondary | ICD-10-CM | POA: Insufficient documentation

## 2015-12-27 DIAGNOSIS — I510 Cardiac septal defect, acquired: Secondary | ICD-10-CM | POA: Insufficient documentation

## 2015-12-27 DIAGNOSIS — Z01818 Encounter for other preprocedural examination: Secondary | ICD-10-CM | POA: Insufficient documentation

## 2015-12-27 DIAGNOSIS — I358 Other nonrheumatic aortic valve disorders: Secondary | ICD-10-CM

## 2015-12-27 LAB — NM MYOCAR MULTI W/SPECT W/WALL MOTION / EF
LV dias vol: 65 mL (ref 62–150)
LV sys vol: 24 mL
Peak HR: 90 {beats}/min
RATE: 0.29
Rest HR: 63 {beats}/min
SDS: 3
SRS: 2
SSS: 5
TID: 0.93

## 2015-12-27 MED ORDER — TECHNETIUM TC 99M TETROFOSMIN IV KIT
10.0000 | PACK | Freq: Once | INTRAVENOUS | Status: AC | PRN
  Administered 2015-12-27: 10.6 via INTRAVENOUS

## 2015-12-27 MED ORDER — TECHNETIUM TC 99M TETROFOSMIN IV KIT
30.0000 | PACK | Freq: Once | INTRAVENOUS | Status: AC | PRN
  Administered 2015-12-27: 32 via INTRAVENOUS

## 2015-12-27 MED ORDER — REGADENOSON 0.4 MG/5ML IV SOLN
INTRAVENOUS | Status: AC
  Administered 2015-12-27: 0.4 mg
  Filled 2015-12-27: qty 5

## 2015-12-27 NOTE — Telephone Encounter (Signed)
-----   Message from Satira Sark, MD sent at 12/27/2015  3:30 PM EDT ----- Results reviewed. There is a small region of lateral apical ischemia, overall low risk stress test result with normal LVEF. In light of these findings, patient should be able to proceed with planned surgery at an acceptable perioperative cardiac risk. A copy of this test should be forwarded to [facility], MD.

## 2015-12-27 NOTE — Progress Notes (Signed)
*  PRELIMINARY RESULTS* Echocardiogram 2D Echocardiogram has been performed.  [name] 12/27/2015, 9:14 AM

## 2015-12-27 NOTE — Telephone Encounter (Signed)
-----   Message from Satira Sark, MD sent at 12/27/2015  1:49 PM EDT ----- Results reviewed. Normal LVEF in the range of 65-70%. No major valvular abnormalities of symptomatic significance at this time. Will follow-up on stress testing results next. A copy of this test should be forwarded to [facility], MD.

## 2015-12-27 NOTE — Telephone Encounter (Signed)
Patient informed and copy sent to PCP and surgeon.

## 2015-12-28 NOTE — Progress Notes (Addendum)
Anesthesia follow-up: See 12/21/15 anesthesia note by Willeen Cass, FNP-BC. Since then patient was seen by cardiologist Dr. Domenic Polite on 12/23/15. He ordered a stress and echo (see below). Following testing, Dr. Domenic Polite wrote, "Results reviewed. There is a small region of lateral apical ischemia, overall low risk stress test result with normal LVEF. In light of these findings, patient should be able to proceed with planned surgery at an acceptable perioperative cardiac risk."  12/27/15 Nuclear stress test:  No diagnostic ischemic ST segment changes over baseline abnormalities.  Small, mild intensity, apical lateral reversible defect consistent with ischemia.  This is a low risk study.  Nuclear stress EF: 63%.  12/27/15 Echo: Impressions: - Moderate LVH with LVEF 65-70%. Grade 1 diastolic dysfunction with  increased LV filling pressure. Mild left atrial enlargement. MAC  with mild mitral regurgitation. Mildly ectatic aortic root.  Sclerotic aortic valve without stenosis. Trivial tricuspid  regurgitation.  Surgery rescheduled for 01/02/16 (later moved to 12/30/15). EKG, CXR, and labs will be within 14 days, so do not need to repeat from an anesthesia standpoint. TCTS RN Thurmond Butts reviewed with Dr. Roxan Hockey to see if he wanted labs or EKG repeated before surgery. Ryan sent the following staff message, "Dr. Roxan Hockey said this patient doesn't need repeat labs or CXR."   Patient now has cardiology clearance. If no acute changes then I would anticipate that he could proceed as planned.  [facility] Short Stay Center/Anesthesiology Phone [phone] 12/28/2015 12:49 PM

## 2015-12-29 NOTE — Progress Notes (Signed)
Spoke with pt's wife informing her of time of arrival for pt's surgery tomorrow. Also verified that they still had PAT instructions which they do.

## 2015-12-30 ENCOUNTER — Encounter (HOSPITAL_COMMUNITY): Payer: Self-pay | Admitting: *Deleted

## 2015-12-30 ENCOUNTER — Inpatient Hospital Stay (HOSPITAL_COMMUNITY): Payer: Medicare Other | Admitting: Vascular Surgery

## 2015-12-30 ENCOUNTER — Ambulatory Visit (HOSPITAL_COMMUNITY): Payer: Medicare Other

## 2015-12-30 ENCOUNTER — Inpatient Hospital Stay (HOSPITAL_COMMUNITY): Payer: Medicare Other

## 2015-12-30 ENCOUNTER — Inpatient Hospital Stay (HOSPITAL_COMMUNITY)
Admission: RE | Admit: 2015-12-30 | Discharge: 2016-01-05 | DRG: 164 | Disposition: A | Discharge status: Home / Self Care | Payer: Medicare Other | Source: Ambulatory Visit | Attending: Thoracic Surgery (Cardiothoracic Vascular Surgery) | Admitting: Thoracic Surgery (Cardiothoracic Vascular Surgery)

## 2015-12-30 ENCOUNTER — Encounter (HOSPITAL_COMMUNITY)
Admission: RE | Disposition: A | Discharge status: Home / Self Care | Payer: Self-pay | Source: Ambulatory Visit | Attending: Thoracic Surgery (Cardiothoracic Vascular Surgery)

## 2015-12-30 DIAGNOSIS — E119 Type 2 diabetes mellitus without complications: Secondary | ICD-10-CM | POA: Diagnosis present

## 2015-12-30 DIAGNOSIS — I700 Atherosclerosis of aorta: Secondary | ICD-10-CM | POA: Diagnosis present

## 2015-12-30 DIAGNOSIS — Z9889 Other specified postprocedural states: Secondary | ICD-10-CM

## 2015-12-30 DIAGNOSIS — C342 Malignant neoplasm of middle lobe, bronchus or lung: Principal | ICD-10-CM | POA: Diagnosis present

## 2015-12-30 DIAGNOSIS — I10 Essential (primary) hypertension: Secondary | ICD-10-CM | POA: Diagnosis not present

## 2015-12-30 DIAGNOSIS — H9190 Unspecified hearing loss, unspecified ear: Secondary | ICD-10-CM | POA: Diagnosis present

## 2015-12-30 DIAGNOSIS — D513 Other dietary vitamin B12 deficiency anemia: Secondary | ICD-10-CM | POA: Diagnosis present

## 2015-12-30 DIAGNOSIS — I9789 Other postprocedural complications and disorders of the circulatory system, not elsewhere classified: Secondary | ICD-10-CM | POA: Diagnosis not present

## 2015-12-30 DIAGNOSIS — Z800 Family history of malignant neoplasm of digestive organs: Secondary | ICD-10-CM | POA: Diagnosis not present

## 2015-12-30 DIAGNOSIS — Z7984 Long term (current) use of oral hypoglycemic drugs: Secondary | ICD-10-CM

## 2015-12-30 DIAGNOSIS — E278 Other specified disorders of adrenal gland: Secondary | ICD-10-CM | POA: Diagnosis present

## 2015-12-30 DIAGNOSIS — I459 Conduction disorder, unspecified: Secondary | ICD-10-CM | POA: Diagnosis not present

## 2015-12-30 DIAGNOSIS — E785 Hyperlipidemia, unspecified: Secondary | ICD-10-CM | POA: Diagnosis present

## 2015-12-30 DIAGNOSIS — I4891 Unspecified atrial fibrillation: Secondary | ICD-10-CM | POA: Diagnosis not present

## 2015-12-30 DIAGNOSIS — Z4682 Encounter for fitting and adjustment of non-vascular catheter: Secondary | ICD-10-CM

## 2015-12-30 DIAGNOSIS — Z902 Acquired absence of lung [part of]: Secondary | ICD-10-CM

## 2015-12-30 DIAGNOSIS — Z7982 Long term (current) use of aspirin: Secondary | ICD-10-CM

## 2015-12-30 DIAGNOSIS — R911 Solitary pulmonary nodule: Secondary | ICD-10-CM

## 2015-12-30 DIAGNOSIS — J811 Chronic pulmonary edema: Secondary | ICD-10-CM | POA: Diagnosis not present

## 2015-12-30 DIAGNOSIS — Z87891 Personal history of nicotine dependence: Secondary | ICD-10-CM

## 2015-12-30 DIAGNOSIS — L905 Scar conditions and fibrosis of skin: Secondary | ICD-10-CM

## 2015-12-30 HISTORY — PX: VIDEO ASSISTED THORACOSCOPY (VATS)/ LOBECTOMY: SHX6169

## 2015-12-30 LAB — GLUCOSE, CAPILLARY
Glucose-Capillary: 137 mg/dL — ABNORMAL HIGH (ref 65–99)
Glucose-Capillary: 147 mg/dL — ABNORMAL HIGH (ref 65–99)
Glucose-Capillary: 182 mg/dL — ABNORMAL HIGH (ref 65–99)
Glucose-Capillary: 183 mg/dL — ABNORMAL HIGH (ref 65–99)
Glucose-Capillary: 194 mg/dL — ABNORMAL HIGH (ref 65–99)
Glucose-Capillary: 195 mg/dL — ABNORMAL HIGH (ref 65–99)
Glucose-Capillary: 199 mg/dL — ABNORMAL HIGH (ref 65–99)

## 2015-12-30 SURGERY — VIDEO ASSISTED THORACOSCOPY (VATS)/ LOBECTOMY
Anesthesia: General | Site: Chest | Laterality: Right

## 2015-12-30 MED ORDER — PROMETHAZINE HCL 25 MG/ML IJ SOLN: 6.2500 mg | INTRAMUSCULAR | Status: DC | PRN

## 2015-12-30 MED ORDER — FENTANYL CITRATE (PF) 250 MCG/5ML IJ SOLN
INTRAMUSCULAR | Status: AC
  Filled 2015-12-30: qty 5

## 2015-12-30 MED ORDER — PSYLLIUM 95 % PO PACK
1.0000 | PACK | Freq: Every day | ORAL | Status: DC
  Administered 2015-12-30 – 2016-01-02 (×4): 1 via ORAL
  Filled 2015-12-30 (×4): qty 1

## 2015-12-30 MED ORDER — NALOXONE HCL 0.4 MG/ML IJ SOLN: 0.4000 mg | INTRAMUSCULAR | Status: DC | PRN

## 2015-12-30 MED ORDER — MIDAZOLAM HCL 2 MG/2ML IJ SOLN
INTRAMUSCULAR | Status: AC
  Filled 2015-12-30: qty 2

## 2015-12-30 MED ORDER — ENOXAPARIN SODIUM 40 MG/0.4ML ~~LOC~~ SOLN
40.0000 mg | SUBCUTANEOUS | Status: DC
Start: 2015-12-30 — End: 2016-01-05
  Administered 2015-12-30 – 2016-01-04 (×6): 40 mg via SUBCUTANEOUS
  Filled 2015-12-30 (×6): qty 0.4

## 2015-12-30 MED ORDER — POTASSIUM CHLORIDE 10 MEQ/50ML IV SOLN
10.0000 meq | Freq: Every day | INTRAVENOUS | Status: DC | PRN
  Administered 2015-12-30 – 2015-12-31 (×3): 10 meq via INTRAVENOUS
  Filled 2015-12-30 (×3): qty 50

## 2015-12-30 MED ORDER — LORATADINE 10 MG PO TABS
10.0000 mg | ORAL_TABLET | Freq: Every day | ORAL | Status: DC
  Administered 2015-12-31 – 2016-01-05 (×6): 10 mg via ORAL
  Filled 2015-12-30 (×6): qty 1

## 2015-12-30 MED ORDER — FENTANYL 40 MCG/ML IV SOLN
INTRAVENOUS | Status: AC
  Filled 2015-12-30: qty 25

## 2015-12-30 MED ORDER — TRAMADOL HCL 50 MG PO TABS
50.0000 mg | ORAL_TABLET | Freq: Four times a day (QID) | ORAL | Status: DC | PRN
  Administered 2016-01-05: 100 mg via ORAL
  Filled 2015-12-30: qty 2

## 2015-12-30 MED ORDER — BISACODYL 5 MG PO TBEC
10.0000 mg | DELAYED_RELEASE_TABLET | Freq: Every day | ORAL | Status: DC
  Administered 2015-12-31 – 2016-01-04 (×4): 10 mg via ORAL
  Filled 2015-12-30 (×4): qty 2

## 2015-12-30 MED ORDER — INSULIN ASPART 100 UNIT/ML ~~LOC~~ SOLN
0.0000 [IU] | Freq: Three times a day (TID) | SUBCUTANEOUS | Status: DC
  Administered 2015-12-30: 4 [IU] via SUBCUTANEOUS
  Administered 2015-12-31: 2 [IU] via SUBCUTANEOUS
  Administered 2015-12-31 – 2016-01-01 (×5): 4 [IU] via SUBCUTANEOUS
  Administered 2016-01-01 (×2): 8 [IU] via SUBCUTANEOUS
  Administered 2016-01-02 (×3): 4 [IU] via SUBCUTANEOUS
  Administered 2016-01-03 (×2): 2 [IU] via SUBCUTANEOUS
  Administered 2016-01-03: 4 [IU] via SUBCUTANEOUS
  Administered 2016-01-04: 2 [IU] via SUBCUTANEOUS
  Administered 2016-01-04: 4 [IU] via SUBCUTANEOUS
  Administered 2016-01-04: 2 [IU] via SUBCUTANEOUS
  Administered 2016-01-04: 4 [IU] via SUBCUTANEOUS
  Administered 2016-01-05: 2 [IU] via SUBCUTANEOUS

## 2015-12-30 MED ORDER — ONDANSETRON HCL 4 MG/2ML IJ SOLN: 4.0000 mg | Freq: Four times a day (QID) | INTRAMUSCULAR | Status: DC | PRN

## 2015-12-30 MED ORDER — ALBUMIN HUMAN 5 % IV SOLN
INTRAVENOUS | Status: DC | PRN
  Administered 2015-12-30: 10:00:00 via INTRAVENOUS

## 2015-12-30 MED ORDER — HEMOSTATIC AGENTS (NO CHARGE) OPTIME
TOPICAL | Status: DC | PRN
  Administered 2015-12-30 (×2): 1 via TOPICAL

## 2015-12-30 MED ORDER — SUGAMMADEX SODIUM 200 MG/2ML IV SOLN
INTRAVENOUS | Status: DC | PRN
  Administered 2015-12-30: 400 mg via INTRAVENOUS

## 2015-12-30 MED ORDER — FENTANYL CITRATE (PF) 250 MCG/5ML IJ SOLN
INTRAMUSCULAR | Status: DC | PRN
  Administered 2015-12-30 (×2): 50 ug via INTRAVENOUS
  Administered 2015-12-30: 100 ug via INTRAVENOUS
  Administered 2015-12-30: 50 ug via INTRAVENOUS
  Administered 2015-12-30 (×3): 100 ug via INTRAVENOUS
  Administered 2015-12-30: 150 ug via INTRAVENOUS
  Administered 2015-12-30: 50 ug via INTRAVENOUS

## 2015-12-30 MED ORDER — SUGAMMADEX SODIUM 500 MG/5ML IV SOLN
INTRAVENOUS | Status: AC
  Filled 2015-12-30: qty 5

## 2015-12-30 MED ORDER — OXYCODONE HCL 5 MG PO TABS
5.0000 mg | ORAL_TABLET | ORAL | Status: DC | PRN
  Administered 2016-01-03 – 2016-01-04 (×2): 10 mg via ORAL
  Filled 2015-12-30 (×2): qty 2

## 2015-12-30 MED ORDER — DEXTROSE 5 % IV SOLN
10.0000 mg | INTRAVENOUS | Status: DC | PRN
  Administered 2015-12-30: 20 ug/min via INTRAVENOUS

## 2015-12-30 MED ORDER — LIDOCAINE HCL (CARDIAC) 20 MG/ML IV SOLN
INTRAVENOUS | Status: DC | PRN
  Administered 2015-12-30: 100 mg via INTRAVENOUS

## 2015-12-30 MED ORDER — ATORVASTATIN CALCIUM 40 MG PO TABS
40.0000 mg | ORAL_TABLET | Freq: Every day | ORAL | Status: DC
  Administered 2015-12-31 – 2016-01-04 (×5): 40 mg via ORAL
  Filled 2015-12-30 (×5): qty 1

## 2015-12-30 MED ORDER — DIPHENHYDRAMINE HCL 50 MG/ML IJ SOLN
12.5000 mg | Freq: Four times a day (QID) | INTRAMUSCULAR | Status: DC | PRN
  Administered 2016-01-02: 12.5 mg via INTRAVENOUS
  Filled 2015-12-30: qty 1

## 2015-12-30 MED ORDER — LISINOPRIL 5 MG PO TABS
5.0000 mg | ORAL_TABLET | Freq: Every day | ORAL | Status: DC
  Administered 2015-12-31 – 2016-01-02 (×3): 5 mg via ORAL
  Filled 2015-12-30 (×3): qty 1

## 2015-12-30 MED ORDER — ROCURONIUM BROMIDE 100 MG/10ML IV SOLN
INTRAVENOUS | Status: DC | PRN
  Administered 2015-12-30: 50 mg via INTRAVENOUS
  Administered 2015-12-30: 10 mg via INTRAVENOUS
  Administered 2015-12-30: 50 mg via INTRAVENOUS

## 2015-12-30 MED ORDER — ACETAMINOPHEN 500 MG PO TABS
1000.0000 mg | ORAL_TABLET | Freq: Four times a day (QID) | ORAL | Status: AC
  Administered 2015-12-30 – 2016-01-04 (×18): 1000 mg via ORAL
  Filled 2015-12-30 (×15): qty 2

## 2015-12-30 MED ORDER — DEXTROSE 5 % IV SOLN
1.5000 g | INTRAVENOUS | Status: AC
  Administered 2015-12-30: 1.5 g via INTRAVENOUS
  Filled 2015-12-30: qty 1.5

## 2015-12-30 MED ORDER — LACTATED RINGERS IV SOLN
INTRAVENOUS | Status: DC | PRN
  Administered 2015-12-30: 07:00:00 via INTRAVENOUS

## 2015-12-30 MED ORDER — HYDROMORPHONE HCL 1 MG/ML IJ SOLN
0.2500 mg | INTRAMUSCULAR | Status: DC | PRN
  Administered 2015-12-30 (×2): 0.25 mg via INTRAVENOUS
  Administered 2015-12-30: 0.5 mg via INTRAVENOUS

## 2015-12-30 MED ORDER — POLYETHYLENE GLYCOL 3350 17 G PO PACK
17.0000 g | PACK | Freq: Every day | ORAL | Status: DC
  Administered 2015-12-31 – 2016-01-02 (×3): 17 g via ORAL
  Filled 2015-12-30 (×3): qty 1

## 2015-12-30 MED ORDER — HYDROMORPHONE HCL 1 MG/ML IJ SOLN
INTRAMUSCULAR | Status: AC
  Filled 2015-12-30: qty 1

## 2015-12-30 MED ORDER — BUPIVACAINE HCL (PF) 0.5 % IJ SOLN
INTRAMUSCULAR | Status: AC
  Filled 2015-12-30: qty 10

## 2015-12-30 MED ORDER — SODIUM CHLORIDE 0.9% FLUSH: 9.0000 mL | INTRAVENOUS | Status: DC | PRN

## 2015-12-30 MED ORDER — LEVALBUTEROL HCL 0.63 MG/3ML IN NEBU
0.6300 mg | INHALATION_SOLUTION | Freq: Four times a day (QID) | RESPIRATORY_TRACT | Status: DC
  Administered 2015-12-30: 0.63 mg via RESPIRATORY_TRACT

## 2015-12-30 MED ORDER — VITAMIN D3 25 MCG (1000 UNIT) PO TABS
2000.0000 [IU] | ORAL_TABLET | Freq: Every day | ORAL | Status: DC
  Administered 2015-12-31 – 2016-01-05 (×6): 2000 [IU] via ORAL
  Filled 2015-12-30 (×12): qty 2

## 2015-12-30 MED ORDER — BUPIVACAINE HCL (PF) 0.5 % IJ SOLN
INTRAMUSCULAR | Status: DC | PRN
Start: 2015-12-30 — End: 2015-12-30
  Administered 2015-12-30: 10 mL

## 2015-12-30 MED ORDER — LEVALBUTEROL HCL 0.63 MG/3ML IN NEBU
0.6300 mg | INHALATION_SOLUTION | Freq: Three times a day (TID) | RESPIRATORY_TRACT | Status: DC
  Administered 2015-12-31 – 2016-01-01 (×6): 0.63 mg via RESPIRATORY_TRACT
  Filled 2015-12-30 (×6): qty 3

## 2015-12-30 MED ORDER — ROCURONIUM BROMIDE 50 MG/5ML IV SOLN
INTRAVENOUS | Status: AC
  Filled 2015-12-30: qty 1

## 2015-12-30 MED ORDER — 0.9 % SODIUM CHLORIDE (POUR BTL) OPTIME
TOPICAL | Status: DC | PRN
  Administered 2015-12-30: 2000 mL

## 2015-12-30 MED ORDER — ACETAMINOPHEN 160 MG/5ML PO SOLN: 1000.0000 mg | Freq: Four times a day (QID) | ORAL | Status: AC

## 2015-12-30 MED ORDER — BUPIVACAINE 0.5 % ON-Q PUMP SINGLE CATH 400 ML
400.0000 mL | INJECTION | Status: AC
  Filled 2015-12-30: qty 400

## 2015-12-30 MED ORDER — DEXTROSE 5 % IV SOLN
1.5000 g | Freq: Two times a day (BID) | INTRAVENOUS | Status: AC
  Administered 2015-12-30 – 2015-12-31 (×2): 1.5 g via INTRAVENOUS
  Filled 2015-12-30 (×2): qty 1.5

## 2015-12-30 MED ORDER — METOCLOPRAMIDE HCL 5 MG/ML IJ SOLN
10.0000 mg | Freq: Four times a day (QID) | INTRAMUSCULAR | Status: AC
  Administered 2015-12-30 – 2015-12-31 (×3): 10 mg via INTRAVENOUS
  Filled 2015-12-30 (×3): qty 2

## 2015-12-30 MED ORDER — PROPOFOL 10 MG/ML IV BOLUS
INTRAVENOUS | Status: DC | PRN
  Administered 2015-12-30: 100 mg via INTRAVENOUS

## 2015-12-30 MED ORDER — MIDAZOLAM HCL 5 MG/5ML IJ SOLN
INTRAMUSCULAR | Status: DC | PRN
  Administered 2015-12-30: 0.5 mg via INTRAVENOUS

## 2015-12-30 MED ORDER — PROPOFOL 10 MG/ML IV BOLUS
INTRAVENOUS | Status: AC
  Filled 2015-12-30: qty 20

## 2015-12-30 MED ORDER — BUPIVACAINE ON-Q PAIN PUMP (FOR ORDER SET NO CHG)
INJECTION | Status: AC
  Filled 2015-12-30: qty 1

## 2015-12-30 MED ORDER — POTASSIUM CHLORIDE IN NACL 20-0.9 MEQ/L-% IV SOLN
INTRAVENOUS | Status: DC
  Administered 2015-12-30: 16:00:00 via INTRAVENOUS
  Administered 2015-12-31: 100 mL/h via INTRAVENOUS
  Administered 2015-12-31: 50 mL/h via INTRAVENOUS
  Administered 2016-01-02: 21:00:00 via INTRAVENOUS
  Filled 2015-12-30 (×6): qty 1000

## 2015-12-30 MED ORDER — ONDANSETRON HCL 4 MG/2ML IJ SOLN
INTRAMUSCULAR | Status: AC
  Filled 2015-12-30: qty 2

## 2015-12-30 MED ORDER — FENTANYL 40 MCG/ML IV SOLN
INTRAVENOUS | Status: DC
  Administered 2015-12-30: 12:00:00 via INTRAVENOUS
  Administered 2015-12-30 (×2): 50 ug via INTRAVENOUS
  Administered 2015-12-30 – 2015-12-31 (×2): 10 ug via INTRAVENOUS
  Administered 2015-12-31: 50 ug via INTRAVENOUS
  Administered 2015-12-31: 10 ug via INTRAVENOUS
  Administered 2015-12-31: 28 ug via INTRAVENOUS
  Administered 2015-12-31: 20 ug via INTRAVENOUS
  Administered 2015-12-31: 0 ug via INTRAVENOUS
  Administered 2016-01-01: 10 ug via INTRAVENOUS
  Administered 2016-01-01: 0 ug via INTRAVENOUS
  Administered 2016-01-01: 40 ug via INTRAVENOUS
  Administered 2016-01-01: 20 ug via INTRAVENOUS
  Administered 2016-01-02: 0 ug via INTRAVENOUS
  Administered 2016-01-02: 40 mL via INTRAVENOUS
  Administered 2016-01-03: 30 ug via INTRAVENOUS

## 2015-12-30 MED ORDER — MECLIZINE HCL 25 MG PO TABS: 25.0000 mg | ORAL_TABLET | Freq: Every day | ORAL | Status: DC | PRN

## 2015-12-30 MED ORDER — PANTOPRAZOLE SODIUM 40 MG PO TBEC
40.0000 mg | DELAYED_RELEASE_TABLET | Freq: Every day | ORAL | Status: DC
  Administered 2015-12-31 – 2016-01-05 (×6): 40 mg via ORAL
  Filled 2015-12-30 (×6): qty 1

## 2015-12-30 MED ORDER — FENTANYL CITRATE (PF) 250 MCG/5ML IJ SOLN
INTRAMUSCULAR | Status: AC
  Filled 2015-12-30: qty 10

## 2015-12-30 MED ORDER — ASPIRIN EC 81 MG PO TBEC
81.0000 mg | DELAYED_RELEASE_TABLET | Freq: Every day | ORAL | Status: DC
  Administered 2015-12-31 – 2016-01-05 (×6): 81 mg via ORAL
  Filled 2015-12-30 (×6): qty 1

## 2015-12-30 MED ORDER — TERAZOSIN HCL 5 MG PO CAPS
5.0000 mg | ORAL_CAPSULE | Freq: Every day | ORAL | Status: DC
  Administered 2015-12-31 – 2016-01-04 (×5): 5 mg via ORAL
  Filled 2015-12-30 (×6): qty 1

## 2015-12-30 MED ORDER — DIPHENHYDRAMINE HCL 12.5 MG/5ML PO ELIX: 12.5000 mg | ORAL_SOLUTION | Freq: Four times a day (QID) | ORAL | Status: DC | PRN

## 2015-12-30 MED ORDER — SENNOSIDES-DOCUSATE SODIUM 8.6-50 MG PO TABS
1.0000 | ORAL_TABLET | Freq: Every day | ORAL | Status: DC
  Administered 2015-12-31 – 2016-01-03 (×3): 1 via ORAL
  Filled 2015-12-30 (×3): qty 1

## 2015-12-30 SURGICAL SUPPLY — 88 items
APPLIER CLIP ROT 10 11.4 M/L (STAPLE)
BLADE SURG 11 STRL SS (BLADE) ×2 IMPLANT
CANISTER SUCTION 2500CC (MISCELLANEOUS) ×2 IMPLANT
CATH KIT ON Q 5IN SLV (PAIN MANAGEMENT) IMPLANT
CATH KIT ON-Q SILVERSOAK 5IN (CATHETERS) ×2 IMPLANT
CATH KIT ON-Q SILVERSOAKER 10 (CATHETERS) ×2 IMPLANT
CATH THORACIC 28FR (CATHETERS) ×2 IMPLANT
CATH THORACIC 28FR RT ANG (CATHETERS) IMPLANT
CATH THORACIC 36FR (CATHETERS) IMPLANT
CATH THORACIC 36FR RT ANG (CATHETERS) IMPLANT
CLIP APPLIE ROT 10 11.4 M/L (STAPLE) IMPLANT
CLIP TI MEDIUM 24 (CLIP) ×2 IMPLANT
CLIP TI MEDIUM 6 (CLIP) IMPLANT
CONN ST 1/4X3/8  BEN (MISCELLANEOUS) ×1
CONN ST 1/4X3/8 BEN (MISCELLANEOUS) ×1 IMPLANT
CONN Y 3/8X3/8X3/8  BEN (MISCELLANEOUS)
CONN Y 3/8X3/8X3/8 BEN (MISCELLANEOUS) IMPLANT
CONT SPEC 4OZ CLIKSEAL STRL BL (MISCELLANEOUS) ×24 IMPLANT
COVER SURGICAL LIGHT HANDLE (MISCELLANEOUS) IMPLANT
CUTTER ECHEON FLEX ENDO 45 340 (ENDOMECHANICALS) ×2 IMPLANT
DERMABOND ADVANCED (GAUZE/BANDAGES/DRESSINGS) ×1
DERMABOND ADVANCED .7 DNX12 (GAUZE/BANDAGES/DRESSINGS) ×1 IMPLANT
DRAIN CHANNEL 28F RND 3/8 FF (WOUND CARE) ×2 IMPLANT
DRAIN CHANNEL 32F RND 10.7 FF (WOUND CARE) IMPLANT
DRAPE LAPAROSCOPIC ABDOMINAL (DRAPES) ×2 IMPLANT
DRAPE SLUSH/WARMER DISC (DRAPES) ×2 IMPLANT
DRAPE WARM FLUID 44X44 (DRAPE) IMPLANT
ELECT BLADE 6.5 EXT (BLADE) ×2 IMPLANT
ELECT REM PT RETURN 9FT ADLT (ELECTROSURGICAL) ×2
ELECTRODE REM PT RTRN 9FT ADLT (ELECTROSURGICAL) ×1 IMPLANT
GAUZE SPONGE 4X4 12PLY STRL (GAUZE/BANDAGES/DRESSINGS) ×2 IMPLANT
GAUZE XEROFORM 1X8 LF (GAUZE/BANDAGES/DRESSINGS) ×4 IMPLANT
GLOVE SURG SIGNA 7.5 PF LTX (GLOVE) ×4 IMPLANT
GOWN STRL REUS W/ TWL LRG LVL3 (GOWN DISPOSABLE) ×3 IMPLANT
GOWN STRL REUS W/ TWL XL LVL3 (GOWN DISPOSABLE) ×1 IMPLANT
GOWN STRL REUS W/TWL LRG LVL3 (GOWN DISPOSABLE) ×3
GOWN STRL REUS W/TWL XL LVL3 (GOWN DISPOSABLE) ×1
HEMOSTAT SURGICEL 2X14 (HEMOSTASIS) ×2 IMPLANT
KIT BASIN OR (CUSTOM PROCEDURE TRAY) ×2 IMPLANT
KIT ROOM TURNOVER OR (KITS) ×2 IMPLANT
KIT SUCTION CATH 14FR (SUCTIONS) ×2 IMPLANT
NS IRRIG 1000ML POUR BTL (IV SOLUTION) ×6 IMPLANT
PACK CHEST (CUSTOM PROCEDURE TRAY) ×2 IMPLANT
PAD ARMBOARD 7.5X6 YLW CONV (MISCELLANEOUS) ×8 IMPLANT
POUCH ENDO CATCH II 15MM (MISCELLANEOUS) IMPLANT
POUCH SPECIMEN RETRIEVAL 10MM (ENDOMECHANICALS) ×2 IMPLANT
RELOAD GOLD ECHELON 45 (STAPLE) ×12 IMPLANT
RELOAD GREEN ECHELON 45 (STAPLE) ×2 IMPLANT
SCISSORS ENDO CVD 5DCS (MISCELLANEOUS) IMPLANT
SEALANT PROGEL (MISCELLANEOUS) ×2 IMPLANT
SEALANT SURG COSEAL 4ML (VASCULAR PRODUCTS) IMPLANT
SEALANT SURG COSEAL 8ML (VASCULAR PRODUCTS) IMPLANT
SHEARS HARMONIC HDI 20CM (ELECTROSURGICAL) ×2 IMPLANT
SOLUTION ANTI FOG 6CC (MISCELLANEOUS) ×2 IMPLANT
SPECIMEN JAR MEDIUM (MISCELLANEOUS) IMPLANT
SPONGE INTESTINAL PEANUT (DISPOSABLE) ×4 IMPLANT
SPONGE TONSIL 1 RF SGL (DISPOSABLE) ×2 IMPLANT
STAPLE RELOAD 2.5MM WHITE (STAPLE) ×4 IMPLANT
STAPLER VASCULAR ECHELON 35 (CUTTER) ×2 IMPLANT
SUT PROLENE 4 0 RB 1 (SUTURE)
SUT PROLENE 4-0 RB1 .5 CRCL 36 (SUTURE) IMPLANT
SUT SILK  1 MH (SUTURE) ×3
SUT SILK 1 MH (SUTURE) ×3 IMPLANT
SUT SILK 1 TIES 10X30 (SUTURE) IMPLANT
SUT SILK 2 0 SH (SUTURE) IMPLANT
SUT SILK 2 0SH CR/8 30 (SUTURE) IMPLANT
SUT SILK 3 0 SH 30 (SUTURE) IMPLANT
SUT SILK 3 0SH CR/8 30 (SUTURE) ×2 IMPLANT
SUT VIC AB 1 CTX 36 (SUTURE) ×1
SUT VIC AB 1 CTX36XBRD ANBCTR (SUTURE) ×1 IMPLANT
SUT VIC AB 2-0 CTX 36 (SUTURE) ×2 IMPLANT
SUT VIC AB 2-0 UR6 27 (SUTURE) ×2 IMPLANT
SUT VIC AB 3-0 MH 27 (SUTURE) IMPLANT
SUT VIC AB 3-0 X1 27 (SUTURE) IMPLANT
SUT VICRYL 2 TP 1 (SUTURE) IMPLANT
SWAB COLLECTION DEVICE MRSA (MISCELLANEOUS) IMPLANT
SYRINGE 10CC LL (SYRINGE) IMPLANT
SYSTEM SAHARA CHEST DRAIN ATS (WOUND CARE) ×2 IMPLANT
TAPE CLOTH 4X10 WHT NS (GAUZE/BANDAGES/DRESSINGS) ×2 IMPLANT
TIP APPLICATOR SPRAY EXTEND 16 (VASCULAR PRODUCTS) ×2 IMPLANT
TOWEL OR 17X24 6PK STRL BLUE (TOWEL DISPOSABLE) ×2 IMPLANT
TOWEL OR 17X26 10 PK STRL BLUE (TOWEL DISPOSABLE) ×2 IMPLANT
TRAP SPECIMEN MUCOUS 40CC (MISCELLANEOUS) ×2 IMPLANT
TRAY FOLEY CATH 16FRSI W/METER (SET/KITS/TRAYS/PACK) ×2 IMPLANT
TROCAR XCEL BLADELESS 5X75MML (TROCAR) ×2 IMPLANT
TUBE ANAEROBIC SPECIMEN COL (MISCELLANEOUS) IMPLANT
TUNNELER SHEATH ON-Q 11GX8 DSP (PAIN MANAGEMENT) ×2 IMPLANT
WATER STERILE IRR 1000ML POUR (IV SOLUTION) ×4 IMPLANT

## 2015-12-30 NOTE — Brief Op Note (Addendum)
12/30/2015  10:58 AM  PATIENT:  [name]  82 y.o. 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  PROCEDURE:  Procedure(s):  VIDEO ASSISTED THORACOSCOPY (VATS) -Thoracoscopic Right Middle Lobectomy -Mediastinal Lymph Node Dissection -Insertion of ON-Q Anesthetic Catheter  SURGEON:  Surgeon(s) and Role:    * Melrose Nakayama, MD - Primary  PHYSICIAN ASSISTANT: Ellwood Handler PA-C  ANESTHESIA:   general  EBL:  Total I/O In: 5615 [I.V.:800; IV Piggyback:250] Out: 1150 [Urine:700; Blood:450]  BLOOD ADMINISTERED:none  DRAINS: 28 Straight, 45 Blake Drain   LOCAL MEDICATIONS USED:  MARCAINE     SPECIMEN:  Source of Specimen:  Right Middle Lobe, Lymph Nodes 12,7,4  DISPOSITION OF SPECIMEN:  PATHOLOGY  COUNTS:  YES  PLAN OF CARE: Admit to inpatient   PATIENT DISPOSITION:  ICU - intubated and hemodynamically stable.   Delay start of Pharmacological VTE agent (>24hrs) due to surgical blood loss or risk of bleeding: yes  FROZEN: adenocarcinoma, bronchial margin negative

## 2015-12-30 NOTE — Progress Notes (Signed)
Patient ID: LADD CEN, male   DOB: [birth year], 82 y.o.   MRN: [id number]   SICU Evening Rounds:   Hemodynamically stable  sats 96% Urine output good  CT output low Mobilizing  CBC    Component Value Date/Time   WBC 10.9* 12/20/2015 1458   RBC 4.66 12/20/2015 1458   HGB 13.7 12/20/2015 1458   HCT 41.3 12/20/2015 1458   PLT 266 12/20/2015 1458   MCV 88.6 12/20/2015 1458   MCH 29.4 12/20/2015 1458   MCHC 33.2 12/20/2015 1458   RDW 13.1 12/20/2015 1458   LYMPHSABS 2.3 12/07/2015 1052   MONOABS 0.9 12/07/2015 1052   EOSABS 0.9* 12/07/2015 1052   BASOSABS 0.1 12/07/2015 1052     BMET    Component Value Date/Time   NA 139 12/20/2015 1458   K 3.5 12/20/2015 1458   CL 110 12/20/2015 1458   CO2 21* 12/20/2015 1458   GLUCOSE 79 12/20/2015 1458   BUN 11 12/20/2015 1458   CREATININE 0.88 12/20/2015 1458   CREATININE 0.95 11/03/2015 1452   CALCIUM 9.1 12/20/2015 1458   GFRNONAA >60 12/20/2015 1458   GFRAA >60 12/20/2015 1458     A/P:  Stable postop course. Continue current plans

## 2015-12-30 NOTE — Anesthesia Procedure Notes (Signed)
Procedure Name: Intubation Date/Time: 12/30/2015 7:43 AM Performed by: Ollen Bowl Pre-anesthesia Checklist: Patient identified, Emergency Drugs available, Suction available, Patient being monitored and Timeout performed Patient Re-evaluated:Patient Re-evaluated prior to inductionOxygen Delivery Method: Circle system utilized and Simple face mask Preoxygenation: Pre-oxygenation with 100% oxygen Intubation Type: IV induction Ventilation: Mask ventilation without difficulty Laryngoscope Size: Miller and 3 Grade View: Grade I Endobronchial tube: Left, Double lumen EBT, EBT position confirmed by auscultation and EBT position confirmed by fiberoptic bronchoscope and 39 Fr Number of attempts: 1 Airway Equipment and Method: Patient positioned with wedge pillow and Stylet Placement Confirmation: ETT inserted through vocal cords under direct vision,  positive ETCO2 and breath sounds checked- equal and bilateral Tube secured with: Tape Dental Injury: Teeth and Oropharynx as per pre-operative assessment

## 2015-12-30 NOTE — Anesthesia Preprocedure Evaluation (Addendum)
Anesthesia Evaluation  Patient identified by MRN, date of birth, ID band Patient awake    Airway Mallampati: I       Dental  (+) Edentulous Upper, Edentulous Lower, Dental Advisory Given   Pulmonary former smoker,    breath sounds clear to auscultation       Cardiovascular  Rhythm:Regular Rate:Normal     Neuro/Psych    GI/Hepatic GERD  ,  Endo/Other  diabetes  Renal/GU      Musculoskeletal   Abdominal   Peds  Hematology   Anesthesia Other Findings   Reproductive/Obstetrics                          Anesthesia Physical Anesthesia Plan  ASA: III  Anesthesia Plan: General   Post-op Pain Management:    Induction: Intravenous  Airway Management Planned: Double Lumen EBT  Additional Equipment: Arterial line and CVP  Intra-op Plan:   Post-operative Plan: Possible Post-op intubation/ventilation and Extubation in OR  Informed Consent: I have reviewed the patients History and Physical, chart, labs and discussed the procedure including the risks, benefits and alternatives for the proposed anesthesia with the patient or authorized representative who has indicated his/her understanding and acceptance.   Dental advisory given  Plan Discussed with: CRNA and Surgeon  Anesthesia Plan Comments:         Anesthesia Quick Evaluation

## 2015-12-30 NOTE — Anesthesia Postprocedure Evaluation (Signed)
Anesthesia Post Note  Patient: [name]  Procedure(s) Performed: Procedure(s) (LRB): VIDEO ASSISTED THORACOSCOPY (VATS)/RIGHT MIDDLE LOBECTOMY (Right)  Patient location during evaluation: PACU Anesthesia Type: General Level of consciousness: awake and alert Pain management: pain level controlled Vital Signs Assessment: post-procedure vital signs reviewed and stable Respiratory status: spontaneous breathing, nonlabored ventilation, respiratory function stable and patient connected to nasal cannula oxygen Cardiovascular status: blood pressure returned to baseline and stable Postop Assessment: no signs of nausea or vomiting Anesthetic complications: no    Last Vitals:  Filed Vitals:   12/30/15 1338 12/30/15 1342  BP: 110/59 110/59  Pulse: 83 84  Temp:  36.4 C  Resp: 12 12    Last Pain:  Filed Vitals:   12/30/15 1446  PainSc: Asleep                 Leo Weyandt,JAMES TERRILL

## 2015-12-30 NOTE — Transfer of Care (Signed)
Immediate Anesthesia Transfer of Care Note  Patient: [name]  Procedure(s) Performed: Procedure(s): VIDEO ASSISTED THORACOSCOPY (VATS)/RIGHT MIDDLE LOBECTOMY (Right)  Patient Location: PACU  Anesthesia Type:General  Level of Consciousness: awake and alert   Airway & Oxygen Therapy: Patient Spontanous Breathing and Patient connected to face mask oxygen  Post-op Assessment: Report given to RN, Post -op Vital signs reviewed and stable and Patient moving all extremities X 4  Post vital signs: Reviewed and stable  Last Vitals:  Filed Vitals:   12/30/15 0557  BP: 171/60  Pulse: 67  Temp: 36.9 C  Resp: 18    Last Pain:  Filed Vitals:   12/30/15 0558  PainSc: 0-No pain         Complications: No apparent anesthesia complications

## 2015-12-30 NOTE — H&P (View-Only) (Signed)
PCP is Monico Blitz, MD Referring Provider is Monico Blitz, MD  Chief Complaint  Patient presents with  . Lung Lesion    Surgical eval, PET Scan 11/30/15, PFT's 12/07/15    HPI: 82 year old man sent for consultation regarding a right middle lobe nodule.  [name] is an 81 year old man with a history of tobacco abuse (about one half pack per day for 50 years, quit in 2004), type 2 non-insulin-dependent diabetes with neuropathy, hyperlipidemia, and anemia due to vitamin B 12 deficiency. He recently had colonoscopy. As part of his workup he had a CT of the abdomen and pelvis. He was found to have a spiculated right middle lobe nodule.  A PET/CT showed a 1.4 x 1.8 cm spiculated right middle lobe nodule with hypermetabolic uptake with an SUV of 3.1. There was a mildly hypermetabolic adrenal nodule consistent with an adenoma. There was no evidence of regional or distant metastatic disease.  As noted he quit smoking in 2004. He has occasional bronchitis but otherwise has not had any problems with his breathing. He walks as much as 5 miles a day without shortness of breath. He is not having any chest pain, pressure, or tightness. He denies wheezing or hemoptysis. He has an occasional cough, but nothing regular. He has not had any change in appetite or significant weight loss. He has noted decreased energy. He was recently diagnosed with anemia due to vitamin B 12 deficiency and has been receiving B-12 shots.  Zubrod Score: At the time of surgery this patient's most appropriate activity status/level should be described as: '[x]'$     0    Normal activity, no symptoms '[]'$     1    Restricted in physical strenuous activity but ambulatory, able to do out light work '[]'$     2    Ambulatory and capable of self care, unable to do work activities, up and about >50 % of waking hours                              '[]'$     3    Only limited self care, in bed greater than 50% of waking hours '[]'$     4    Completely disabled, no  self care, confined to bed or chair '[]'$     5    Moribund  Past Medical History  Diagnosis Date  . Hernia   . Diabetes mellitus   . Cyst of scrotum   . Hyperlipidemia   . Adenocarcinoma, lung ([place])   . Arthritis   . Lung cancer ([place]) 12/02/2015  . B12 deficiency 12/08/2015    Past Surgical History  Procedure Laterality Date  . Back surgery    . Cholecystectomy    . Colonoscopy N/A 11/24/2015    Procedure: COLONOSCOPY;  Surgeon: Rogene Houston, MD;  Location: [facility];  Service: Endoscopy;  Laterality: N/A;  730  . Lumbar disc surgery      per patient report    Family History  Problem Relation Age of Onset  . Cancer Brother     colon CA    Social History Social History  Substance Use Topics  . Smoking status: Former Smoker    Quit date: 05/27/2004  . Smokeless tobacco: None  . Alcohol Use: No    Current Outpatient Prescriptions  Medication Sig Dispense Refill  . aspirin EC 81 MG tablet Take 81 mg by mouth daily.    . Cholecalciferol (VITAMIN  D) 2000 UNITS CAPS Take 1 capsule by mouth daily.     Marland Kitchen glipiZIDE (GLUCOTROL) 5 MG tablet Take 2.5 mg by mouth 2 (two) times daily before a meal.    . lisinopril (PRINIVIL,ZESTRIL) 10 MG tablet Take 5 mg by mouth daily.    Marland Kitchen loratadine (CLARITIN) 10 MG tablet Take 10 mg by mouth daily.    . meclizine (ANTIVERT) 25 MG tablet Take 25 mg by mouth daily as needed for dizziness.    . pantoprazole (PROTONIX) 40 MG tablet Take 40 mg by mouth 2 (two) times daily.     . polyethylene glycol (MIRALAX / GLYCOLAX) packet Take 17 g by mouth daily.    . simvastatin (ZOCOR) 80 MG tablet Take 40 mg by mouth at bedtime.     Marland Kitchen terazosin (HYTRIN) 5 MG capsule Take 5 mg by mouth at bedtime.     No current facility-administered medications for this visit.    No Known Allergies  Review of Systems  Constitutional: Negative for activity change, appetite change and unexpected weight change.  HENT: Positive for dental problem (dentures) and  hearing loss. Negative for trouble swallowing and voice change.   Eyes: Positive for visual disturbance (catarracts- surgery 2016).  Respiratory: Positive for cough (not at present). Negative for chest tightness, shortness of breath and wheezing.   Cardiovascular: Positive for leg swelling. Negative for chest pain and palpitations.  Gastrointestinal: Positive for abdominal pain. Negative for blood in stool.       4 polyps removed in June  Genitourinary: Positive for urgency and frequency.       BPH  Musculoskeletal: Positive for back pain, joint swelling and arthralgias.  Neurological: Positive for dizziness.       Neuropathy in feet- paresthesias  Hematological: Negative for adenopathy. Bruises/bleeds easily.    BP 126/67 mmHg  Pulse 78  Resp 20  Ht 6' (1.829 m)  Wt 203 lb (92.08 kg)  BMI 27.53 kg/m2  SpO2 96% Physical Exam  Constitutional: He is oriented to person, place, and time. He appears well-developed and well-nourished. No distress.  HENT:  Head: Normocephalic and atraumatic.  Mouth/Throat: No oropharyngeal exudate.  Eyes: Conjunctivae and EOM are normal. No scleral icterus.  Neck: Neck supple. No thyromegaly present.  Cardiovascular: Normal rate, regular rhythm and normal heart sounds.  Exam reveals no gallop and no friction rub.   No murmur heard. Pulmonary/Chest: Effort normal. No respiratory distress. He has no wheezes. He has no rales.  Abdominal: Soft. He exhibits no distension. There is no tenderness.  Musculoskeletal: Normal range of motion. He exhibits no edema.  Lymphadenopathy:    He has no cervical adenopathy.  Neurological: He is alert and oriented to person, place, and time. No cranial nerve deficit.  Motor intact  Skin: Skin is warm and dry.  Vitals reviewed.    Diagnostic Tests: NUCLEAR MEDICINE PET SKULL BASE TO THIGH  TECHNIQUE: 10.1 mCi F-18 FDG was injected intravenously. Full-ring PET imaging was performed from the skull base to thigh  after the radiotracer. CT data was obtained and used for attenuation correction and anatomic localization.  FASTING BLOOD GLUCOSE: Value: 155 mg/dl  COMPARISON: CT abdomen pelvis 11/10/2015.  FINDINGS: NECK  No hypermetabolic lymph nodes in the neck. CT images show no acute findings.  CHEST  Spiculated 1.4 x 1.8 cm right middle lobe nodule has an SUV max of 3.1. No hypermetabolic mediastinal, hilar or axillary lymph nodes. CT images show aortic atherosclerosis and coronary artery calcification. Heart is at  the upper limits of normal in size. No pericardial or pleural effusion. Centrilobular emphysema.  ABDOMEN/PELVIS  There is mild hypermetabolism within a right adrenal nodule, which measures fluid in density on CT and measures 16 mm. No abnormal hypermetabolism in the liver, left adrenal gland, spleen or pancreas. No hypermetabolic lymph nodes. Liver is unremarkable. Cholecystectomy. Adrenal glands and right kidney are otherwise unremarkable. Low-attenuation lesions off the left kidney measure up to 4.6 cm and are likely cysts when compared with 11/10/2015. Spleen, pancreas, stomach and bowel are grossly unremarkable. Prostate is at the upper limits of normal. Bladder wall thickening is likely due to underdistention. A degree of bladder outlet obstruction cannot be excluded. Atherosclerotic calcification of the arterial vasculature without abdominal aortic aneurysm. 3.1 cm low-attenuation lesion overlying the upper right paraspinal musculature is indicative of a a sebaceous cyst.  SKELETON  No abnormal osseous hypermetabolism. Postoperative changes in the lumbar spine.  IMPRESSION: 1. Spiculated right middle lobe nodule is mildly hypermetabolic and most consistent with a stage IA adenocarcinoma. 2. Mildly hypermetabolic right adrenal nodule has CT attenuation characteristics consistent with an adenoma. 3. Aortic atherosclerosis.   Electronically  Signed  By: Lorin Picket M.D.  On: 11/30/2015 11:54  FVC= 3.45 (81%) FEV1= 2.00 (66%), 2.31 (77%) postbronchodilator DLCO= 12.40 (35%)  I personally reviewed the PET/CT confirmed with findings noted above.  Impression: 82 year old gentleman with a history of tobacco abuse who has an incidentally discovered spiculated right middle lobe nodule was hypermetabolic on PET/CT. This has an extremely high likelihood of being a non-small cell carcinoma. Granulomatous disease is in the differential, but odds are its cancers.  I reviewed the PET CT images with Mr. and Mrs. Blanch Media. We discussed potential diagnostic and treatment options. We discussed CT-guided needle biopsy, bronchscopic biopsy, SBRT, and surgery. We also discussed noninvasive radiographic follow-up. We discussed the relative advantages and disadvantages of each of the approaches. I recommended that we proceed with a right VATS and right middle lobectomy for diagnostic and therapeutic purposes.   I discussed with the patient the general nature of the procedure, the need for general anesthesia, the incisions to be used, and the use of drainage tubes postoperatively. We discussed the expected hospital stay, overall recovery and short and long term outcomes. I reviewed the indications, risks, benefits, and alternatives. They understand the risks include but are not limited to death, stroke, MI, DVT/PE, bleeding, possible need for transfusion, infections, prolonged air leak, cardiac arrhythmias, and other organ system dysfunction including respiratory, renal, or GI complications.   He accepts the risks and wishes to proceed with surgery.  Plan:  Right VATS, right middle lobectomy on Thursday, 12/22/2015.  Melrose Nakayama, MD [facility] [phone]

## 2015-12-30 NOTE — Interval H&P Note (Signed)
History and Physical Interval Note:  Felt to be acceptable CV risk per Cardiology   12/30/2015 7:16 AM  [name]  has presented today for surgery, with the diagnosis of RML NODULE  The various methods of treatment have been discussed with the patient and family. After consideration of risks, benefits and other options for treatment, the patient has consented to  Procedure(s): VIDEO ASSISTED THORACOSCOPY (VATS)/RIGHT MIDDLE LOBECTOMY (Right) as a surgical intervention .  The patient's history has been reviewed, patient examined, no change in status, stable for surgery.  I have reviewed the patient's chart and labs.  Questions were answered to the patient's satisfaction.     Melrose Nakayama

## 2015-12-31 ENCOUNTER — Inpatient Hospital Stay (HOSPITAL_COMMUNITY): Payer: Medicare Other

## 2015-12-31 LAB — BASIC METABOLIC PANEL
Anion gap: 5 (ref 5–15)
BUN: <5 mg/dL — ABNORMAL LOW (ref 6–20)
CO2: 26 mmol/L (ref 22–32)
Calcium: 8 mg/dL — ABNORMAL LOW (ref 8.9–10.3)
Chloride: 106 mmol/L (ref 101–111)
Creatinine, Ser: 0.78 mg/dL (ref 0.61–1.24)
GFR calc Af Amer: >60 mL/min (ref >60)
GFR calc non Af Amer: >60 mL/min (ref >60)
Glucose, Bld: 169 mg/dL — ABNORMAL HIGH (ref 65–99)
Potassium: 4.1 mmol/L (ref 3.5–5.1)
Sodium: 137 mmol/L (ref 135–145)

## 2015-12-31 LAB — GLUCOSE, CAPILLARY
Glucose-Capillary: 186 mg/dL — ABNORMAL HIGH (ref 65–99)
Glucose-Capillary: 184 mg/dL — ABNORMAL HIGH (ref 65–99)
Glucose-Capillary: 160 mg/dL — ABNORMAL HIGH (ref 65–99)
Glucose-Capillary: 193 mg/dL — ABNORMAL HIGH (ref 65–99)

## 2015-12-31 LAB — CBC
HCT: 36.8 % — ABNORMAL LOW (ref 39.0–52.0)
Hemoglobin: 11.9 g/dL — ABNORMAL LOW (ref 13.0–17.0)
MCH: 29 pg (ref 26.0–34.0)
MCHC: 32.3 g/dL (ref 30.0–36.0)
MCV: 89.8 fL (ref 78.0–100.0)
Platelets: 205 10*3/uL (ref 150–400)
RBC: 4.1 MIL/uL — ABNORMAL LOW (ref 4.22–5.81)
RDW: 13.4 % (ref 11.5–15.5)
WBC: 12.1 10*3/uL — ABNORMAL HIGH (ref 4.0–10.5)

## 2015-12-31 LAB — POCT I-STAT 3, ART BLOOD GAS (G3+)
Bicarbonate: 24.8 mEq/L — ABNORMAL HIGH (ref 20.0–24.0)
O2 Saturation: 92 %
Patient temperature: 98.4
TCO2: 26 mmol/L (ref 0–100)
pCO2 arterial: 41.4 mmHg (ref 35.0–45.0)
pH, Arterial: 7.386 (ref 7.350–7.450)
pO2, Arterial: 65 mmHg — ABNORMAL LOW (ref 80.0–100.0)

## 2015-12-31 NOTE — Op Note (Signed)
[name], [name]                ACCOUNT NO.:  [id number]  MEDICAL RECORD NO.:  [id number]  LOCATION:                                 FACILITY:  PHYSICIAN:  Revonda Standard. Roxan Hockey, M.D.DATE OF BIRTH:  1930-07-16  DATE OF PROCEDURE:  12/30/2015 DATE OF DISCHARGE:                              OPERATIVE REPORT   PREOPERATIVE DIAGNOSIS:  Right middle lobe nodule.  POSTOPERATIVE DIAGNOSIS:  Adenocarcinoma of right middle lobe, clinical stage Ib.  PROCEDURE:  Right video-assisted thoracoscopy, Thoracoscopic right middle lobectomy, Mediastinal lymph node dissection, and On-Q local anesthetic catheter placement.  SURGEON:  Revonda Standard. Roxan Hockey, M.D.  ASSISTANT:  Ellwood Handler, PA  ANESTHESIA:  General.  FINDINGS:  A 1.5-2 cm nodule in the right middle lobe with some invagination of the visceral pleura.  Enlarged, but otherwise benign- appearing lymph nodes.  Frozen section showed adenocarcinoma, bronchial margin negative for tumor.  CLINICAL NOTE:  [name] is an 82 year old man with a history of tobacco abuse who recently had colonoscopy and a CT of the abdomen and pelvis.  There was an incidentally noted nodule in the right middle lobe.  A PET-CT was done which showed a 1.4 x 1.8 cm spiculated right middle lobe nodule that was hypermetabolic with an SUV of 3.1.  The patient was advised to undergo right middle lobectomy for diagnostic and therapeutic purposes.  The indications, risks, benefits, and alternatives were discussed in detail with the patient.  He understood and accepted the risks and agreed to proceed.  OPERATIVE NOTE:  [name] was brought to the preoperative holding area on December 30, 2015.  The Anesthesia Service placed a central line and an arterial blood pressure monitoring line.  He was taken to the operating room, anesthetized, and intubated with a double-lumen endotracheal tube. Intravenous antibiotics were administered.  A Foley catheter was  placed. Sequential compressive devices were placed on the calves for DVT prophylaxis.  He was placed in a left lateral decubitus position and the right chest was prepped and draped in usual sterile fashion.  Single lung ventilation of the right lung was initiated and was tolerated well throughout the procedure.  After performing a time-out, an incision was made in the seventh intercostal space in the midaxillary line.  A 5 mm port was inserted into the chest.  The thoracoscope was advanced into the chest.  There was good isolation of the right lung.  There was no pleural effusion and no abnormality of the parietal pleura.  A 5 cm working incision was made in the fifth interspace anterolaterally.  No rib spreading was performed during the procedure.  The right middle lobe was inspected.  In the anterior portion the right middle lobe, there was a palpable mass. There did appear to be some invagination of the visceral pleura.  A decision was made to proceed with a right middle lobectomy as discussed with the patient preoperatively.  The dissection was begun in the major fissure, which was nearly complete.  There was a large node overlying the bifurcation of the right middle lobe arterial branch, which arose as a single common branch and the right middle lobe bronchus.  All nodes that  were encountered during the dissection were dissected out and sent as separate specimens for permanent pathology.  Next, the inferior ligament was divided with the Harmonic scalpel.  No definite lymph node was identified.  The pleural reflection was divided anteriorly and posteriorly at the hilum.  The right middle lobe and upper lobe pulmonary veins were identified.  There were multiple relatively small middle lobe vein branches as well as 1 larger middle lobe vein.  The smaller branches were divided with the Harmonic scalpel.  The larger branch was encircled and divided with the vascular stapler.  The  major fissure then was completed up to its confluence with the minor fissure with an endoscopic stapler.  The Ethicon Echelon 45 mm stapler with a gold cartridge was used.  The minor fissure was relatively incomplete.  The fissure was started with sequential firings of a stapler.  At this point, dissection returned to around the right middle lobe bronchus. There were several small lymph nodes around this bronchus which were all removed and sent for pathology.  The bronchus then was encircled.  A separate port incision was made just below the tip of the scapula and the Echelon stapler with a green cartridge was advanced through this port incision across the right middle lobe bronchus and was closed.  The test inflation showed good aeration of the upper and lower lobes.  The stapler then was fired transecting the middle lobe bronchus.  This then allowed good exposure of the right middle lobe pulmonary artery branch which was divided with the endoscopic stapler.  The minor fissure then was completed with sequential firings of the 45 mm stapler with gold cartridges.  The specimen was placed into an endoscopic retrieval bag, removed, and sent for frozen section.  While awaiting the results of the frozen section, an On-Q local anesthetic catheter was placed through a separate stab incision posteriorly and tunneled into a subpleural location.  It was primed with 5 mL of 0.5% Marcaine and secured to skin with a 3-0 silk suture.  The subcarinal space was explored and multiple lymph nodes were found in this area, several of which were enlarged, but otherwise benign- appearing nodes that were relatively vascular.  The Harmonic scalpel was used for the dissection.  Finally, the pleura overlying the mediastinum just superior to the azygos vein was incised and the level 4R nodes were removed as well.  These nodes appeared relatively normal in size and appearance.  The frozen section returned showing  adenocarcinoma with clear bronchial margins.  The chest was copiously irrigated with a warm saline.  A test inflation to 30 cm of water showed some air leakage from the parenchyma from the dissection of the fissures.  ProGEL was applied to these areas.  There was no air leakage from the bronchial stump.  A 28-French Blake drain was placed to the original port incision and a 28- French chest tube was placed through a separate port incision and these were secured to skin with #1 silk sutures.  The posterior incision was closed with a #1 Vicryl fascial suture followed by a 3-0 Vicryl subcuticular suture.  The anterior working incision was closed in 3 layers after ensuring a good reinflation of the right lung.  The chest tubes were placed to suction.  The patient was placed back in a supine position.  He was extubated in the operating room and taken to the postanesthetic care unit in good condition.     Revonda Standard Roxan Hockey, M.D.  SCH/MEDQ  D:  12/30/2015  T:  12/30/2015  Job:  [id number]

## 2015-12-31 NOTE — Progress Notes (Signed)
1 Day Post-Op Procedure(s) (LRB): VIDEO ASSISTED THORACOSCOPY (VATS)/RIGHT MIDDLE LOBECTOMY (Right) Subjective: Did not sleep much because he could not get comfortable.  Objective: Vital signs in last 24 hours: Temp:  [97.5 F (36.4 C)-98.4 F (36.9 C)] 98.3 F (36.8 C) (07/22 0802) Pulse Rate:  [72-102] 75 (07/22 1000) Cardiac Rhythm:  [-] Normal sinus rhythm (07/22 0800) Resp:  [3-23] 23 (07/22 1000) BP: (87-159)/(43-100) 149/55 mmHg (07/22 1000) SpO2:  [90 %-98 %] 96 % (07/22 1000) Arterial Line BP: (67-211)/(46-187) 121/114 mmHg (07/22 0630) Weight:  [92.9 kg (204 lb 12.9 oz)] 92.9 kg (204 lb 12.9 oz) (07/22 0630)  Hemodynamic parameters for last 24 hours:    Intake/Output from previous day: 07/21 0701 - 07/22 0700 In: 3650 [P.O.:210; I.V.:2940; IV Piggyback:500] Out: 2845 [Urine:2135; Blood:450; Chest Tube:260] Intake/Output this shift: Total I/O In: 300 [I.V.:300] Out: 450 [Urine:350; Chest Tube:100]  General appearance: alert and cooperative Heart: regular rate and rhythm, S1, S2 normal, no murmur, click, rub or gallop Lungs: clear to auscultation bilaterally Wound: incision ok no air leak from chest tubes  Lab Results:  Recent Labs  12/31/15 0400  WBC 12.1*  HGB 11.9*  HCT 36.8*  PLT 205   BMET:  Recent Labs  12/31/15 0400  NA 137  K 4.1  CL 106  CO2 26  GLUCOSE 169*  BUN <5*  CREATININE 0.78  CALCIUM 8.0*    PT/INR: No results for input(s): LABPROT, INR in the last 72 hours. ABG    Component Value Date/Time   PHART 7.386 12/31/2015 0400   HCO3 24.8* 12/31/2015 0400   TCO2 26 12/31/2015 0400   ACIDBASEDEF 0.4 12/20/2015 1458   O2SAT 92.0 12/31/2015 0400   CBG (last 3)   Recent Labs  12/30/15 2159 12/30/15 2347 12/31/15 0824  GLUCAP 195* 147* 186*   CLINICAL DATA: Status post right middle lobectomy for adenocarcinoma.  EXAM: PORTABLE CHEST 1 VIEW  COMPARISON: 12/30/2015  FINDINGS: Right chest tubes remain in place  without pneumothorax postoperatively. There remains some mild air in the anterior right chest wall outlining pectoral muscle fibers. Atelectasis in the right lower lung appears stable. Stable positioning of right jugular central line.  IMPRESSION: Stable right lung atelectasis postoperatively. No pneumothorax visualized.   Electronically Signed  By: Aletta Edouard M.D.  On: 12/31/2015 09:31  Assessment/Plan: S/P Procedure(s) (LRB): VIDEO ASSISTED THORACOSCOPY (VATS)/RIGHT MIDDLE LOBECTOMY (Right)  He is doing well overall.  Put chest tubes to water seal Mobilize and IS   LOS: 1 day    Gaye Pollack 12/31/2015

## 2015-12-31 NOTE — Progress Notes (Signed)
Patient ID: [name], male   DOB: 07/06/1930, 82 y.o.   MRN: [id number]  SICU Evening Rounds:   Hemodynamically stable   Urine output good  CT output low  CBC    Component Value Date/Time   WBC 12.1* 12/31/2015 0400   RBC 4.10* 12/31/2015 0400   HGB 11.9* 12/31/2015 0400   HCT 36.8* 12/31/2015 0400   PLT 205 12/31/2015 0400   MCV 89.8 12/31/2015 0400   MCH 29.0 12/31/2015 0400   MCHC 32.3 12/31/2015 0400   RDW 13.4 12/31/2015 0400   LYMPHSABS 2.3 12/07/2015 1052   MONOABS 0.9 12/07/2015 1052   EOSABS 0.9* 12/07/2015 1052   BASOSABS 0.1 12/07/2015 1052     BMET    Component Value Date/Time   NA 137 12/31/2015 0400   K 4.1 12/31/2015 0400   CL 106 12/31/2015 0400   CO2 26 12/31/2015 0400   GLUCOSE 169* 12/31/2015 0400   BUN <5* 12/31/2015 0400   CREATININE 0.78 12/31/2015 0400   CREATININE 0.95 11/03/2015 1452   CALCIUM 8.0* 12/31/2015 0400   GFRNONAA >60 12/31/2015 0400   GFRAA >60 12/31/2015 0400     A/P:  Stable postop course. Continue current plans

## 2016-01-01 ENCOUNTER — Inpatient Hospital Stay (HOSPITAL_COMMUNITY): Payer: Medicare Other

## 2016-01-01 ENCOUNTER — Encounter (HOSPITAL_COMMUNITY): Payer: Self-pay | Admitting: Thoracic Surgery (Cardiothoracic Vascular Surgery)

## 2016-01-01 LAB — CBC
HCT: 35 % — ABNORMAL LOW (ref 39.0–52.0)
Hemoglobin: 11.1 g/dL — ABNORMAL LOW (ref 13.0–17.0)
MCH: 28.8 pg (ref 26.0–34.0)
MCHC: 31.7 g/dL (ref 30.0–36.0)
MCV: 90.7 fL (ref 78.0–100.0)
Platelets: 206 10*3/uL (ref 150–400)
RBC: 3.86 MIL/uL — ABNORMAL LOW (ref 4.22–5.81)
RDW: 13.6 % (ref 11.5–15.5)
WBC: 10.9 10*3/uL — ABNORMAL HIGH (ref 4.0–10.5)

## 2016-01-01 LAB — COMPREHENSIVE METABOLIC PANEL
ALT: 16 U/L — ABNORMAL LOW (ref 17–63)
AST: 21 U/L (ref 15–41)
Albumin: 2.7 g/dL — ABNORMAL LOW (ref 3.5–5.0)
Alkaline Phosphatase: 51 U/L (ref 38–126)
Anion gap: 4 — ABNORMAL LOW (ref 5–15)
BUN: 5 mg/dL — ABNORMAL LOW (ref 6–20)
CO2: 28 mmol/L (ref 22–32)
Calcium: 7.9 mg/dL — ABNORMAL LOW (ref 8.9–10.3)
Chloride: 106 mmol/L (ref 101–111)
Creatinine, Ser: 0.77 mg/dL (ref 0.61–1.24)
GFR calc Af Amer: >60 mL/min (ref >60)
GFR calc non Af Amer: >60 mL/min (ref >60)
Glucose, Bld: 171 mg/dL — ABNORMAL HIGH (ref 65–99)
Potassium: 4.1 mmol/L (ref 3.5–5.1)
Sodium: 138 mmol/L (ref 135–145)
Total Bilirubin: 0.8 mg/dL (ref 0.3–1.2)
Total Protein: 5.1 g/dL — ABNORMAL LOW (ref 6.5–8.1)

## 2016-01-01 LAB — GLUCOSE, CAPILLARY
Glucose-Capillary: 162 mg/dL — ABNORMAL HIGH (ref 65–99)
Glucose-Capillary: 208 mg/dL — ABNORMAL HIGH (ref 65–99)
Glucose-Capillary: 186 mg/dL — ABNORMAL HIGH (ref 65–99)
Glucose-Capillary: 247 mg/dL — ABNORMAL HIGH (ref 65–99)

## 2016-01-01 MED ORDER — AMIODARONE LOAD VIA INFUSION
150.0000 mg | Freq: Once | INTRAVENOUS | Status: AC
  Administered 2016-01-01: 150 mg via INTRAVENOUS
  Filled 2016-01-01: qty 83.34

## 2016-01-01 MED ORDER — AMIODARONE HCL IN DEXTROSE 360-4.14 MG/200ML-% IV SOLN
60.0000 mg/h | INTRAVENOUS | Status: AC
  Administered 2016-01-01 (×2): 60 mg/h via INTRAVENOUS
  Filled 2016-01-01: qty 200

## 2016-01-01 MED ORDER — AMIODARONE HCL IN DEXTROSE 360-4.14 MG/200ML-% IV SOLN
30.0000 mg/h | INTRAVENOUS | Status: AC
  Administered 2016-01-01 – 2016-01-02 (×2): 30 mg/h via INTRAVENOUS
  Filled 2016-01-01: qty 200

## 2016-01-01 NOTE — Progress Notes (Signed)
2 Days Post-Op Procedure(s) (LRB): VIDEO ASSISTED THORACOSCOPY (VATS)/RIGHT MIDDLE LOBECTOMY (Right) Subjective: No complaints  Objective: Vital signs in last 24 hours: Temp:  [97.9 F (36.6 C)-98.2 F (36.8 C)] 98.2 F (36.8 C) (07/23 0759) Pulse Rate:  [74-91] 84 (07/23 1000) Cardiac Rhythm: Normal sinus rhythm (07/23 0746) Resp:  [14-28] 18 (07/23 1000) BP: (115-147)/(43-97) 115/43 (07/23 1000) SpO2:  [93 %-100 %] 95 % (07/23 1000)  Hemodynamic parameters for last 24 hours:    Intake/Output from previous day: 07/22 0701 - 07/23 0700 In: 1570 [P.O.:120; I.V.:1400; IV Piggyback:50] Out: 7867 [Urine:2850; Chest Tube:440] Intake/Output this shift: Total I/O In: 150 [I.V.:150] Out: -   General appearance: alert and cooperative Heart: regular rate and rhythm, S1, S2 normal, no murmur, click, rub or gallop Lungs: clear to auscultation bilaterally Wound: incision ok no air leak from chest tube.  Lab Results:  Recent Labs  12/31/15 0400 01/01/16 0320  WBC 12.1* 10.9*  HGB 11.9* 11.1*  HCT 36.8* 35.0*  PLT 205 206   BMET:  Recent Labs  12/31/15 0400 01/01/16 0320  NA 137 138  K 4.1 4.1  CL 106 106  CO2 26 28  GLUCOSE 169* 171*  BUN <5* 5*  CREATININE 0.78 0.77  CALCIUM 8.0* 7.9*    PT/INR: No results for input(s): LABPROT, INR in the last 72 hours. ABG    Component Value Date/Time   PHART 7.386 12/31/2015 0400   HCO3 24.8 (H) 12/31/2015 0400   TCO2 26 12/31/2015 0400   ACIDBASEDEF 0.4 12/20/2015 1458   O2SAT 92.0 12/31/2015 0400   CBG (last 3)   Recent Labs  12/31/15 1642 12/31/15 2206 01/01/16 0836  GLUCAP 160* 193* 162*    Assessment/Plan: S/P Procedure(s) (LRB): VIDEO ASSISTED THORACOSCOPY (VATS)/RIGHT MIDDLE LOBECTOMY (Right)  He is doing well. Chest tube output 500 cc for 24 hrs. Will remove one of tubes today and the other probably tomorrow.  Continue ambulation, IS.   LOS: 2 days    Gaye Pollack 01/01/2016

## 2016-01-01 NOTE — Progress Notes (Signed)
Patient ID: [name], male   DOB: [birth year], 82 y.o.   MRN: [id number]  SICU Evening Rounds:  Hemodynamically stable but developed atrial fib with RVR after a walk this afternoon. Amiodarone started with return to NSR.  Urine output ok.   One chest tube out today

## 2016-01-02 ENCOUNTER — Inpatient Hospital Stay (HOSPITAL_COMMUNITY): Payer: Medicare Other

## 2016-01-02 LAB — GLUCOSE, CAPILLARY
Glucose-Capillary: 170 mg/dL — ABNORMAL HIGH (ref 65–99)
Glucose-Capillary: 167 mg/dL — ABNORMAL HIGH (ref 65–99)
Glucose-Capillary: 178 mg/dL — ABNORMAL HIGH (ref 65–99)
Glucose-Capillary: 169 mg/dL — ABNORMAL HIGH (ref 65–99)

## 2016-01-02 MED ORDER — LEVALBUTEROL HCL 0.63 MG/3ML IN NEBU: 0.6300 mg | INHALATION_SOLUTION | Freq: Four times a day (QID) | RESPIRATORY_TRACT | Status: DC | PRN

## 2016-01-02 MED ORDER — AMIODARONE HCL 200 MG PO TABS
400.0000 mg | ORAL_TABLET | Freq: Two times a day (BID) | ORAL | Status: DC
  Administered 2016-01-02 – 2016-01-03 (×4): 400 mg via ORAL
  Filled 2016-01-02 (×4): qty 2

## 2016-01-02 MED ORDER — GLIPIZIDE 5 MG PO TABS
2.5000 mg | ORAL_TABLET | Freq: Two times a day (BID) | ORAL | Status: DC
  Administered 2016-01-02 – 2016-01-05 (×7): 2.5 mg via ORAL
  Filled 2016-01-02 (×7): qty 1

## 2016-01-02 NOTE — Progress Notes (Signed)
3 Days Post-Op Procedure(s) (LRB): VIDEO ASSISTED THORACOSCOPY (VATS)/RIGHT MIDDLE LOBECTOMY (Right) Subjective: No complaints  Objective: Vital signs in last 24 hours: Temp:  [97.6 F (36.4 C)-98.2 F (36.8 C)] 97.6 F (36.4 C) (07/24 0437) Pulse Rate:  [74-151] 87 (07/23 1900) Cardiac Rhythm: Normal sinus rhythm (07/24 0400) Resp:  [13-23] 16 (07/24 0428) BP: (98-154)/(43-75) 128/52 (07/23 1900) SpO2:  [93 %-99 %] 98 % (07/24 0428)  Hemodynamic parameters for last 24 hours:    Intake/Output from previous day: 07/23 0701 - 07/24 0700 In: 2281.8 [P.O.:980; I.V.:901.8] Out: 3090 [Urine:3000; Chest Tube:90] Intake/Output this shift: No intake/output data recorded.  General appearance: alert, cooperative and no distress Neurologic: intact Heart: regular rate and rhythm Lungs: diminished breath sounds bibasilar Abdomen: normal findings: soft, non-tender no air leak  Lab Results:  Recent Labs  12/31/15 0400 01/01/16 0320  WBC 12.1* 10.9*  HGB 11.9* 11.1*  HCT 36.8* 35.0*  PLT 205 206   BMET:  Recent Labs  12/31/15 0400 01/01/16 0320  NA 137 138  K 4.1 4.1  CL 106 106  CO2 26 28  GLUCOSE 169* 171*  BUN <5* 5*  CREATININE 0.78 0.77  CALCIUM 8.0* 7.9*    PT/INR: No results for input(s): LABPROT, INR in the last 72 hours. ABG    Component Value Date/Time   PHART 7.386 12/31/2015 0400   HCO3 24.8 (H) 12/31/2015 0400   TCO2 26 12/31/2015 0400   ACIDBASEDEF 0.4 12/20/2015 1458   O2SAT 92.0 12/31/2015 0400   CBG (last 3)   Recent Labs  01/01/16 1152 01/01/16 1618 01/01/16 2136  GLUCAP 208* 186* 247*    Assessment/Plan: S/P Procedure(s) (LRB): VIDEO ASSISTED THORACOSCOPY (VATS)/RIGHT MIDDLE LOBECTOMY (Right) Plan for transfer to step-down: see transfer orders   No air leak and minimal drainage- dc CT Path pending CBG a little high- restart glipizide Continue ambulation Atrial fib- in SR, convert amio to PO   LOS: 3 days    Melrose Nakayama 01/02/2016

## 2016-01-02 NOTE — Progress Notes (Signed)
Transfered to 2w with no distress noted. Will transfer care at this time.

## 2016-01-02 NOTE — Progress Notes (Signed)
Patient has arrived on unit from 2S. Patient oriented to unit, assessed, placed on tele, VS were stable.[name]

## 2016-01-03 ENCOUNTER — Encounter (HOSPITAL_COMMUNITY): Payer: Self-pay | Admitting: General Practice

## 2016-01-03 ENCOUNTER — Inpatient Hospital Stay (HOSPITAL_COMMUNITY): Payer: Medicare Other

## 2016-01-03 LAB — GLUCOSE, CAPILLARY
Glucose-Capillary: 146 mg/dL — ABNORMAL HIGH (ref 65–99)
Glucose-Capillary: 143 mg/dL — ABNORMAL HIGH (ref 65–99)
Glucose-Capillary: 169 mg/dL — ABNORMAL HIGH (ref 65–99)
Glucose-Capillary: 112 mg/dL — ABNORMAL HIGH (ref 65–99)

## 2016-01-03 MED ORDER — LISINOPRIL 10 MG PO TABS
10.0000 mg | ORAL_TABLET | Freq: Every day | ORAL | Status: DC
  Administered 2016-01-03: 10 mg via ORAL
  Filled 2016-01-03: qty 1

## 2016-01-03 NOTE — Telephone Encounter (Signed)
error 

## 2016-01-03 NOTE — Progress Notes (Addendum)
[address]       [employer] [address]             [phone]      4 Days Post-Op Procedure(s) (LRB): VIDEO ASSISTED THORACOSCOPY (VATS)/RIGHT MIDDLE LOBECTOMY (Right) Subjective: Feeling ok, no specific c/o, had a BM  Objective: Vital signs in last 24 hours: Temp:  [97.5 F (36.4 C)-97.8 F (36.6 C)] 97.8 F (36.6 C) (07/25 0300) Pulse Rate:  [71-87] 78 (07/25 0300) Cardiac Rhythm: Normal sinus rhythm (07/24 1900) Resp:  [15-25] 19 (07/25 0352) BP: (131-149)/(54-63) 131/54 (07/25 0300) SpO2:  [92 %-100 %] 100 % (07/25 0352)  Hemodynamic parameters for last 24 hours:    Intake/Output from previous day: 07/24 0701 - 07/25 0700 In: 163.4 [I.V.:163.4] Out: 320 [Urine:300; Chest Tube:20] Intake/Output this shift: No intake/output data recorded.  General appearance: alert, cooperative and no distress Heart: regular rate and rhythm Lungs: dim right base Abdomen: soft, non-tender Extremities: no edema Wound: incis healing well  Lab Results:  Recent Labs  01/01/16 0320  WBC 10.9*  HGB 11.1*  HCT 35.0*  PLT 206   BMET:  Recent Labs  01/01/16 0320  NA 138  K 4.1  CL 106  CO2 28  GLUCOSE 171*  BUN 5*  CREATININE 0.77  CALCIUM 7.9*    PT/INR: No results for input(s): LABPROT, INR in the last 72 hours. ABG    Component Value Date/Time   PHART 7.386 12/31/2015 0400   HCO3 24.8 (H) 12/31/2015 0400   TCO2 26 12/31/2015 0400   ACIDBASEDEF 0.4 12/20/2015 1458   O2SAT 92.0 12/31/2015 0400   CBG (last 3)   Recent Labs  01/02/16 1636 01/02/16 2009 01/03/16 0635  GLUCAP 178* 169* 146*    Meds Scheduled Meds: . acetaminophen  1,000 mg Oral Q6H   Or  . acetaminophen (TYLENOL) oral liquid 160 mg/5 mL  1,000 mg Oral Q6H  . amiodarone  400 mg Oral BID  . aspirin EC  81 mg Oral Daily  . atorvastatin  40 mg Oral q1800  . bisacodyl  10 mg Oral Daily  . cholecalciferol  2,000 Units Oral Daily  . enoxaparin (LOVENOX) injection  40  mg Subcutaneous Q24H  . fentaNYL   Intravenous Q4H  . glipiZIDE  2.5 mg Oral BID AC  . insulin aspart  0-24 Units Subcutaneous TID AC & HS  . lisinopril  5 mg Oral Daily  . loratadine  10 mg Oral Daily  . pantoprazole  40 mg Oral Daily  . polyethylene glycol  17 g Oral Daily  . psyllium  1 packet Oral Daily  . senna-docusate  1 tablet Oral QHS  . terazosin  5 mg Oral QHS   Continuous Infusions: . 0.9 % NaCl with KCl 20 mEq / L 10 mL/hr at 01/02/16 2032   PRN Meds:.diphenhydrAMINE **OR** diphenhydrAMINE, levalbuterol, meclizine, naloxone **AND** sodium chloride flush, ondansetron (ZOFRAN) IV, oxyCODONE, potassium chloride, traMADol  Xrays Dg Chest 2 View  Result Date: 01/03/2016 CLINICAL DATA:  Status post right lobectomy. EXAM: CHEST  2 VIEW COMPARISON:  Portable chest x-ray of January 02, 2016 FINDINGS: There has been interval removal of the right internal jugular venous catheter. A tiny right apical pneumothorax is visible. There is persistent atelectasis or infiltrate in the right mid and lower lung with small right pleural effusion. The left lung is well-expanded. There is a small left pleural effusion. The heart is normal in size. The pulmonary vascularity is not  engorged. IMPRESSION: 1. Stable tiny right apical pneumothorax. Interval removal of the right internal jugular venous catheter. 2. Persistent volume loss on the right with basilar atelectasis or infiltrate, small effusion, and elevated hemidiaphragm. 3. Tiny left pleural effusion. Electronically Signed   By: David  Martinique M.D.   On: 01/03/2016 07:48  Dg Chest 1v Repeat Same Day  Result Date: 01/02/2016 CLINICAL DATA:  Chest tube removal EXAM: CHEST - 1 VIEW SAME DAY COMPARISON:  01/02/2016 FINDINGS: Right central line slightly retracted. The tip is likely near the confluence of the innominate veins. Tiny right apical pneumothorax again noted, unchanged. Elevation of the right hemidiaphragm with right lower lobe atelectasis or  infiltrate. No confluent opacity on the left. Heart is borderline in size. IMPRESSION: Retraction of the right central line with the tip likely near the confluence of the innominate veins. Stable tiny right apical pneumothorax. Stable right basilar atelectasis or infiltrate with elevation of the right hemidiaphragm Electronically Signed   By: Rolm Baptise M.D.   On: 01/02/2016 10:37  Dg Chest Port 1 View  Result Date: 01/02/2016 CLINICAL DATA:  Tracheostomy.  Chest tube. EXAM: PORTABLE CHEST 1 VIEW COMPARISON:  12/31/2005 FINDINGS: Right chest tube removed.  Tiny right apical pneumothorax. Right jugular central venous catheter tip in the SVC. Right lower lobe airspace density unchanged. Elevated right hemidiaphragm unchanged Negative for edema or effusion. IMPRESSION: Tiny right apical pneumothorax following right chest tube removal Right lower lobe airspace disease and volume loss unchanged. Electronically Signed   By: Franchot Gallo M.D.   On: 01/02/2016 07:13 FINAL DIAGNOSIS Diagnosis 1. Lung, resection (segmental or lobe), Right Middle Lobe - INVASIVE ADENOCARCINOMA, WELL DIFFERENTIATED, SPANNING 1.2 CM. - THE SURGICAL RESECTION MARGINS ARE NEGATIVE FOR CARCINOMA. - METASTATIC CARCINOMA IN 1 OF 1 LYMPH NODE (1/1). - SEE ONCOLOGY TABLE BELOW. 2. Lymph node, biopsy, Level 12 - METASTATIC CARCINOMA IN 1 OF 1 LYMPH NODE (1/1). 3. Lymph node, biopsy, Level 12 #2 - METASTATIC CARCINOMA IN 1 OF 1 LYMPH NODE (1/1). 4. Lymph node, biopsy, Level 7 - METASTATIC CARCINOMA IN 1 OF 1 LYMPH NODE (1/1/). 5. Lymph node, biopsy, Level 7 #2 - METASTATIC CARCINOMA IN 1 OF 1 LYMPH NODE (1/1). 6. Lymph node, biopsy, Level 7 #3 - METASTATIC CARCINOMA IN 1 OF 1 LYMPH NODE (1/1). 7. Lymph node, biopsy, Level 7 #4 - METASTATIC CARCINOMA IN 1 OF 1 LYMPH NODE (1/1). 8. Lymph node, biopsy, 4R - THERE IS NO EVIDENCE OF CARCINOMA IN 1 OF 1 LYMPH NODE (0/1). 9. Lymph node, biopsy, 4R #2 - THERE IS NO EVIDENCE OF  CARCINOMA IN 1 OF 1 LYMPH NODE (0/1). Microscopic Comment 1. LUNG Specimen, including laterality: Right middle lobe and lymph nodes Procedure: Lobectomy and multiple lymph node resections Specimen integrity (intact/disrupted): Intact Tumor site: Right middle lobe, subpleural Tumor focality: Unifocal 1 of 3 FINAL for [name], [name] [phone]) Microscopic Comment(continued) Maximum tumor size (gross measurement): 1.2 cm Histologic type: Adenocarcinoma Grade: Well differentiated Margins: 6.0 cm to the bronchial and vascular resection margins (gross measurement). Visceral pleura invasion: Not definitively identified Tumor extension: Confined to lung parenchyma Treatment effect (if treated with neoadjuvant therapy): N/A Lymph -Vascular invasion: Not identified. Lymph nodes: Number examined - 9; Number N1 nodes positive 4 ; Number N2 nodes positive 3 TNM code: pT1a, pN2 Ancillary studies: A block will be sent to [facility] and the results reported separately Non-neoplastic lung: Anthracosis and interstitial pigment laden macrophages. (JBK:kh 01-02-16) Enid Cutter MD Pathologist, Electronic Signature (Case  signed 01/02/2016) Intraoperative Diagnosis 1. LUNG, RIGHT MIDDLE LOBECTOMY: FROZEN SECTION A: BRONCHIAL MARGIN - NEGATIVE. FROZEN SECTION B: MASS - ADENOCARCINOMA (JM) Specimen Gross and Clinical Information Specimen(s) Obtained: 1. Lung, resection (segmental or lobe), Right Middle Lobe 2. Lymph node, biopsy, Level 12 3. Lymph node, biopsy, Level 12 #2 4. Lymph node, biopsy, Level 7 5. Lymph node, biopsy, Level 7 #2 6. Lymph node, biopsy, Level 7 #3 7. Lymph node, biopsy, Level 7 #4 8. Lymph node, biopsy, 4R 9. Lymph node, biopsy, 4R #2 Specimen Clinical Information 1. RML nodule (nt) Gross 1. Specimen: Right middle lobe Specimen integrity (intact/incised/disrupted): Intact Size, weight: 77 grams, 10 x 7.8 cm and up to 2.5 cm thick. Pleura: Pink red with scattered  slight anthracosis, and has a focal area of umbilication. Lesion: On sectioning through the umbilicated pleura there is a 1.2 x 1 x 0.6 cm tan white firm ill defined subpleural mass. Margin(s): The mass is 6 cm from the bronchial and vascular margins. Hilar vessels: Unremarkable Nonneoplastic parenchyma: Pink red, spongy with no additional lesions or masses. 2 of 3 FINAL for [name], [name] ([id number]) Gross(continued) Lymph nodes, level 12: Adjacent to the bronchus is a 0.5 cm soft anthracotic node. Block Summary: The bronchial margin is submitted for frozen section in block A, and section of the mass is submitted in block B for frozen section. Additional sections for routine histology are submitted as follows: C, D = remaining lesion E = vascular margins F = parenchyma away from mass G = one node, whole. Total = seven blocks. 2. Received fresh labeled level 12 node, is a 1 cm soft anthracotic nodules, bisected and submitted in one block. 3. Received fresh labeled level 12 node #2, is a 1 cm gray black to anthracotic firm nodule, bisected and submitted in one block. 4. Received fresh labeled level 7 node, is a 2.3 x 1.4 x 0.3 cm soft anthracotic nodule, sectioned and entirely submitted in one block. 5. Received fresh labeled level 7 node #2, is a 1 cm gray black to anthracotic soft to rubbery nodule, bisected and submitted in one block. 6. Received fresh labeled level 7 node #3, is a 1 x 0.8 x 0.2 cm yellow red to anthracotic soft tissue, submitted in toto in one block. 7. Received fresh labeled level 7 node#4, is a 0.8 x 0.7 x 0.5 cm yellow red to anthracotic soft tissue, bisected and submitted in one block. 8. Received fresh labeled 4R node, is a 1.6 x 1.3 x 0.4 cm yellow red to anthracotic soft tissue, sectioned and submitted in one block. 9. Received fresh labeled 4R node #2, is a 1.4 x 1.1 x 0.3 cm aggregate of yellow red to anthracotic soft tissue, submitted in one block.  (SSW:kh 12-30-15) Report signed out from the following location(s) Technical component and interpretation was performed at [facility] [place], [place],  [address]. CLIA #: [id number], 3 of 3    Assessment/Plan: S/P Procedure(s) (LRB): VIDEO ASSISTED THORACOSCOPY (VATS)/RIGHT MIDDLE LOBECTOMY (Right)  1 doing well 2 d/c pca today 3 wean O2, pulm toilet, rehab 4 sugars a little better on glipizide- may need to increase dose 5 BP runs high- increase lisinopril 6 path noted above  LOS: 4 days    GOLD,WAYNE E 01/03/2016  Patient seen and examined, agree with above Wean O2 Continue to mobilize PATH- stage IIIA (T1, N2)- will need adjuvant therapy- will arrange MTOC to see Rad Onc and Hem Onc as outpatient  Burrel Legrand C. Shahara Hartsfield, MD [facility] [phone]  

## 2016-01-03 NOTE — Telephone Encounter (Signed)
or

## 2016-01-03 NOTE — Care Management Important Message (Signed)
Important Message  Patient Details  Name: [name] MRN: [id number] Date of Birth: [birth year]   Medicare Important Message Given:  Yes    Nathen May 01/03/2016, 10:34 AM

## 2016-01-04 LAB — GLUCOSE, CAPILLARY
Glucose-Capillary: 160 mg/dL — ABNORMAL HIGH (ref 65–99)
Glucose-Capillary: 127 mg/dL — ABNORMAL HIGH (ref 65–99)
Glucose-Capillary: 156 mg/dL — ABNORMAL HIGH (ref 65–99)
Glucose-Capillary: 192 mg/dL — ABNORMAL HIGH (ref 65–99)

## 2016-01-04 MED ORDER — AMIODARONE HCL 200 MG PO TABS
200.0000 mg | ORAL_TABLET | Freq: Two times a day (BID) | ORAL | Status: DC
  Administered 2016-01-04 (×2): 200 mg via ORAL
  Filled 2016-01-04 (×3): qty 1

## 2016-01-04 MED ORDER — FUROSEMIDE 10 MG/ML IJ SOLN
40.0000 mg | Freq: Once | INTRAMUSCULAR | Status: AC
  Administered 2016-01-04: 40 mg via INTRAVENOUS
  Filled 2016-01-04: qty 4

## 2016-01-04 MED ORDER — LISINOPRIL 10 MG PO TABS
20.0000 mg | ORAL_TABLET | Freq: Every day | ORAL | Status: DC
  Administered 2016-01-04: 20 mg via ORAL
  Filled 2016-01-04 (×2): qty 2

## 2016-01-04 NOTE — Discharge Instructions (Signed)
Lung Resection, Care After Refer to this sheet in the next few weeks. These instructions provide you with information on caring for yourself after your procedure. Your health care provider may also give you more specific instructions. Your treatment has been planned according to current medical practices, but problems sometimes occur. Call your health care provider if you have any problems or questions after your procedure. WHAT TO EXPECT AFTER THE PROCEDURE After your procedure, it is typical to have the following:   You may feel pain in your chest and throat.  Patients may sometimes shiver or feel nauseous during recovery. HOME CARE INSTRUCTIONS  You may resume a normal diet and activities as directed by your health care provider.  Do not use any tobacco products, including cigarettes, chewing tobacco, or electronic cigarettes. If you need help quitting, ask your health care provider.  There are many different ways to close and cover an incision, including stitches, skin glue, and adhesive strips. Follow your health care provider's instructions on:  Incision care.  Bandage (dressing) changes and removal.  Incision closure removal.  Take medicines only as directed by your health care provider.  Keep all follow-up visits as directed by your health care provider. This is important.  Try to breathe deeply and cough as directed. Holding a pillow firmly over your ribs may help with discomfort.  If you were given an incentive spirometer in the hospital, continue to use it as directed by your health care provider.  Walk as directed by your health care provider.  You may take a shower and gently wash the area of your incision with water and soap as directed by your health care provider. Do not use anything else to clean your incision except as directed by your health care provider. Do not take baths, swim, or use a hot tub until your health care provider approves. SEEK MEDICAL CARE  IF:  You notice redness, swelling, or increasing pain at the incision site.  You are bleeding at the incision site.  You see pus coming from the incision site.  You notice a bad smell coming from the incision site or bandage.  Your incision breaks open.  You cough up blood or pus, or you develop a cough that produces bad-smelling sputum.  You have pain or swelling in your legs.  You have increasing pain that is not controlled with medicine.  You have trouble managing any of the tubes that have been left in place after surgery.  You have fever or chills. SEEK IMMEDIATE MEDICAL CARE IF:   You have chest pain or an irregular or rapid heartbeat.  You have dizzy episodes or faint.  You have shortness of breath or difficulty breathing.  You have persistent nausea or vomiting.  You have a rash.   This information is not intended to replace advice given to you by your health care provider. Make sure you discuss any questions you have with your health care provider.   Document Released: 12/15/2004 Document Revised: 06/18/2014 Document Reviewed: 07/17/2013 Elsevier Interactive Patient Education [employer].

## 2016-01-04 NOTE — Progress Notes (Signed)
01/04/2016 9:36 AM SATURATION QUALIFICATIONS: (This note is used to comply with regulatory documentation for home oxygen)  Patient Saturations on Room Air at Rest = 93%  Patient Saturations on Room Air while Ambulating = 85%  Patient Saturations on 2 Liters of oxygen while Ambulating =93%  Please briefly explain why patient needs home oxygen:Pt dropped to 85% when walking on RA, when o2 at 2L placed SATS were 91-93%. [place]

## 2016-01-04 NOTE — Progress Notes (Addendum)
[address]       [place] [address]             [phone]      5 Days Post-Op Procedure(s) (LRB): VIDEO ASSISTED THORACOSCOPY (VATS)/RIGHT MIDDLE LOBECTOMY (Right) Subjective: conts to feel well, wife states he gets DOE/wheezes and O2 sats are in 80's with ambulation- this is not well documented.   Objective: Vital signs in last 24 hours: Temp:  [97.9 F (36.6 C)-98.4 F (36.9 C)] 98.4 F (36.9 C) (07/25 2031) Pulse Rate:  [73] 73 (07/25 1500) Cardiac Rhythm: Normal sinus rhythm (07/26 0700) Resp:  [20] 20 (07/25 2031) BP: (145-154)/(52-60) 145/53 (07/25 2119) SpO2:  [91 %-92 %] 91 % (07/25 2031)  Hemodynamic parameters for last 24 hours:    Intake/Output from previous day: 07/25 0701 - 07/26 0700 In: 720 [P.O.:720] Out: 375 [Urine:375] Intake/Output this shift: No intake/output data recorded.  General appearance: alert, cooperative and no distress Heart: regular rate and rhythm Lungs: mildly dim in right base Abdomen: benign Extremities: + edema Wound: incis healing well  Lab Results: No results for input(s): WBC, HGB, HCT, PLT in the last 72 hours. BMET: No results for input(s): NA, K, CL, CO2, GLUCOSE, BUN, CREATININE, CALCIUM in the last 72 hours.  PT/INR: No results for input(s): LABPROT, INR in the last 72 hours. ABG    Component Value Date/Time   PHART 7.386 12/31/2015 0400   HCO3 24.8 (H) 12/31/2015 0400   TCO2 26 12/31/2015 0400   ACIDBASEDEF 0.4 12/20/2015 1458   O2SAT 92.0 12/31/2015 0400   CBG (last 3)   Recent Labs  01/03/16 1629 01/03/16 2149 01/04/16 0615  GLUCAP 169* 112* 160*    Meds Scheduled Meds: . acetaminophen  1,000 mg Oral Q6H   Or  . acetaminophen (TYLENOL) oral liquid 160 mg/5 mL  1,000 mg Oral Q6H  . amiodarone  400 mg Oral BID  . aspirin EC  81 mg Oral Daily  . atorvastatin  40 mg Oral q1800  . bisacodyl  10 mg Oral Daily  . cholecalciferol  2,000 Units Oral Daily  . enoxaparin (LOVENOX)  injection  40 mg Subcutaneous Q24H  . glipiZIDE  2.5 mg Oral BID AC  . insulin aspart  0-24 Units Subcutaneous TID AC & HS  . lisinopril  10 mg Oral Daily  . loratadine  10 mg Oral Daily  . pantoprazole  40 mg Oral Daily  . polyethylene glycol  17 g Oral Daily  . psyllium  1 packet Oral Daily  . senna-docusate  1 tablet Oral QHS  . terazosin  5 mg Oral QHS   Continuous Infusions: . 0.9 % NaCl with KCl 20 mEq / L 10 mL/hr at 01/02/16 2032   PRN Meds:.levalbuterol, meclizine, oxyCODONE, potassium chloride, traMADol  Xrays Dg Chest 2 View  Result Date: 01/03/2016 CLINICAL DATA:  Status post right lobectomy. EXAM: CHEST  2 VIEW COMPARISON:  Portable chest x-ray of January 02, 2016 FINDINGS: There has been interval removal of the right internal jugular venous catheter. A tiny right apical pneumothorax is visible. There is persistent atelectasis or infiltrate in the right mid and lower lung with small right pleural effusion. The left lung is well-expanded. There is a small left pleural effusion. The heart is normal in size. The pulmonary vascularity is not engorged. IMPRESSION: 1. Stable tiny right apical pneumothorax. Interval removal of the right internal jugular venous catheter. 2. Persistent volume loss on the right with basilar  atelectasis or infiltrate, small effusion, and elevated hemidiaphragm. 3. Tiny left pleural effusion. Electronically Signed   By: David  Martinique M.D.   On: 01/03/2016 07:48  Dg Chest 1v Repeat Same Day  Result Date: 01/02/2016 CLINICAL DATA:  Chest tube removal EXAM: CHEST - 1 VIEW SAME DAY COMPARISON:  01/02/2016 FINDINGS: Right central line slightly retracted. The tip is likely near the confluence of the innominate veins. Tiny right apical pneumothorax again noted, unchanged. Elevation of the right hemidiaphragm with right lower lobe atelectasis or infiltrate. No confluent opacity on the left. Heart is borderline in size. IMPRESSION: Retraction of the right central line  with the tip likely near the confluence of the innominate veins. Stable tiny right apical pneumothorax. Stable right basilar atelectasis or infiltrate with elevation of the right hemidiaphragm Electronically Signed   By: Rolm Baptise M.D.   On: 01/02/2016 10:37   Assessment/Plan: S/P Procedure(s) (LRB): VIDEO ASSISTED THORACOSCOPY (VATS)/RIGHT MIDDLE LOBECTOMY (Right)  1 good overall prorgress- will check sats with ambulation- poss home later today or tomorrow  2 will give a dose of lasix 3 increase lisinopril with HTN 4 sugar control is adequate for now- follow up with outpatient provider  LOS: 5 days    [name],Gary E 01/04/2016  Patient seen and examined, agree with above Ordered IV lasix Postop atrial fibrillation- will decrease amiodarone to 200 mg BID Hopefully home tomorrow  Remo Lipps C. Roxan Hockey, MD [facility] [phone]

## 2016-01-04 NOTE — Discharge Summary (Signed)
Physician Discharge Summary  Patient ID: [name] MRN: [id number] DOB/AGE: 12-Jul-1930 82 y.o.  Admit date: 12/30/2015 Discharge date: 01/05/2016  Admission Diagnoses:  Patient Active Problem List   Diagnosis Date Noted  . History of tobacco abuse 12/14/2015  . B12 deficiency 12/08/2015  . History of colonic polyps 12/02/2015  . Lung cancer ([place]) 12/02/2015  . High cholesterol 11/03/2015  . Dysphagia, pharyngoesophageal phase 07/30/2012  . Diabetes ([place]) 07/30/2012   Discharge Diagnoses:   Stage IIIA adenocarcinoma right middle lobe  Patient Active Problem List   Diagnosis Date Noted  . S/P lobectomy of lung 12/30/2015  . History of tobacco abuse 12/14/2015  . B12 deficiency 12/08/2015  . History of colonic polyps 12/02/2015  . Lung cancer ([place]) 12/02/2015  . High cholesterol 11/03/2015  . Dysphagia, pharyngoesophageal phase 07/30/2012  . Diabetes ([place]) 07/30/2012   Discharged Condition: good  History of Present Illness:  [name] is an 82 yo male with known history of tobacco abuse, quit in 2004, DM with neuropathy, hyperlipidemia, and anemia due to Vit B 12 deficiency.  He had a routine colonoscopy and CT scan was done as part of the workup. He was noted to have a spiculated right middle lobe nodule.  Further workup with PET CT scan showed the nodule to be hypermetabolic with no evidence of metastatic disease.  He was referred to [facility] for surgical evaluation.  He saw Dr. Roxan Hockey who recommended right middle lobectomy for definitive diagnosis.  The risks and benefits of the procedure were explained to the patient and he was agreeable to proceed.  Hospital Course:   [name] presented to [facility] on 12/30/2015.  He was taken to the operating room and underwent Right VATS with Right middle lobectomy, mediastinal lymph node dissection, and placement of ON-Q local anesthetic catheter.  He tolerated the procedure without difficulty, was extubated and taken to  the SICU in stable condition.  The patient did well.  His chest tubes were placed on water seal POD #1.  CXR was stable and output remained low.  His anterior chest tube was removed on POD #2. The patient developed Atrial Fibrillation while ambulating.  He was treated with Amiodarone with successful conversion to NSR.  His CXR remained stable and his final chest tube was removed on POD #3. He was transitioned to oral Amiodarone and transferred to the telemetry unit in stable condition.  The patient continued to make slow progress. He was ambulating without much difficulty. However, he would desaturate fairly easily. He will require oxygen at discharge.   He was give diureses for pulmonary edema.  He was mildly Hypertensive and his lisinopril dose was increased.   His final pathology is back and he has been staged as IIIA (T1, N2) and will require adjuvant therapy.  He is tolerating a diet and felt medically stable for discharge home today.           Significant Diagnostic Studies: Pathology  1. Lung, resection (segmental or lobe), Right Middle Lobe - INVASIVE ADENOCARCINOMA, WELL DIFFERENTIATED, SPANNING 1.2 CM. - THE SURGICAL RESECTION MARGINS ARE NEGATIVE FOR CARCINOMA. - METASTATIC CARCINOMA IN 1 OF 1 LYMPH NODE (1/1). - SEE ONCOLOGY TABLE BELOW. 2. Lymph node, biopsy, Level 12 - METASTATIC CARCINOMA IN 1 OF 1 LYMPH NODE (1/1). 3. Lymph node, biopsy, Level 12 #2 - METASTATIC CARCINOMA IN 1 OF 1 LYMPH NODE (1/1). 4. Lymph node, biopsy, Level 7 - METASTATIC CARCINOMA IN 1 OF 1 LYMPH NODE (1/1/). 5.  Lymph node, biopsy, Level 7 #2 - METASTATIC CARCINOMA IN 1 OF 1 LYMPH NODE (1/1). 6. Lymph node, biopsy, Level 7 #3 - METASTATIC CARCINOMA IN 1 OF 1 LYMPH NODE (1/1). 7. Lymph node, biopsy, Level 7 #4 - METASTATIC CARCINOMA IN 1 OF 1 LYMPH NODE (1/1). 8. Lymph node, biopsy, 4R - THERE IS NO EVIDENCE OF CARCINOMA IN 1 OF 1 LYMPH NODE (0/1). 9. Lymph node, biopsy, 4R #2 - THERE IS NO EVIDENCE OF  CARCINOMA IN 1 OF 1 LYMPH NODE (0/1).  Treatments:   Right video-assisted thoracoscopy, thoracoscopic right middle lobectomy, mediastinal lymph node dissection, and On-Q local anesthetic catheter placement.  Disposition: 01-Home or Self Care  Discharge medications: Discharge Instructions    For home use only DME oxygen    Complete by:  As directed   Mode or (Route):  Nasal cannula   Liters per Minute:  2   Frequency:  Continuous (stationary and portable oxygen unit needed)   Oxygen delivery system:  Gas       Medication List    STOP taking these medications   vitamin B-12 1000 MCG tablet Commonly known as:  CYANOCOBALAMIN     TAKE these medications   aspirin EC 81 MG tablet Take 81 mg by mouth daily.   glipiZIDE 5 MG tablet Commonly known as:  GLUCOTROL Take 1 tablet (5 mg total) by mouth 2 (two) times daily before a meal. What changed:  how much to take   lisinopril 40 MG tablet Commonly known as:  PRINIVIL,ZESTRIL Take 1 tablet (40 mg total) by mouth daily. What changed:  medication strength  how much to take   loratadine 10 MG tablet Commonly known as:  CLARITIN Take 10 mg by mouth daily.   meclizine 25 MG tablet Commonly known as:  ANTIVERT Take 25 mg by mouth daily as needed for dizziness.   oxyCODONE 5 MG immediate release tablet Commonly known as:  Oxy IR/ROXICODONE Take 1-2 tablets (5-10 mg total) by mouth every 6 (six) hours as needed for severe pain.   pantoprazole 40 MG tablet Commonly known as:  PROTONIX Take 40 mg by mouth daily.   polyethylene glycol packet Commonly known as:  MIRALAX / GLYCOLAX Take 17 g by mouth daily.   psyllium 58.6 % powder Commonly known as:  METAMUCIL Take 1 packet by mouth daily.   simvastatin 80 MG tablet Commonly known as:  ZOCOR Take 40 mg by mouth at bedtime.   terazosin 5 MG capsule Commonly known as:  HYTRIN Take 5 mg by mouth at bedtime.   Vitamin D 2000 units Caps Take 1 capsule by mouth  daily.      Follow-up Information    Melrose Nakayama, MD Follow up on 01/20/2016.   Specialty:  Cardiothoracic Surgery Why:  Appointment is at 11:25m Please [facility] 10:[facility] Contact information: [phone]          Signed: GJohn Giovanni7/27/2017, 8:15 AM

## 2016-01-05 LAB — GLUCOSE, CAPILLARY: Glucose-Capillary: 130 mg/dL — ABNORMAL HIGH (ref 65–99)

## 2016-01-05 MED ORDER — LISINOPRIL 40 MG PO TABS
40.0000 mg | ORAL_TABLET | Freq: Every day | ORAL | Status: DC
  Administered 2016-01-05: 40 mg via ORAL
  Filled 2016-01-05: qty 1

## 2016-01-05 MED ORDER — LISINOPRIL 40 MG PO TABS: 40.0000 mg | ORAL_TABLET | Freq: Every day | ORAL | 1 refills | Status: DC

## 2016-01-05 MED ORDER — OXYCODONE HCL 5 MG PO TABS: 5.0000 mg | ORAL_TABLET | Freq: Four times a day (QID) | ORAL | 0 refills | Status: DC | PRN

## 2016-01-05 MED ORDER — GLIPIZIDE 5 MG PO TABS: 5.0000 mg | ORAL_TABLET | Freq: Two times a day (BID) | ORAL | 1 refills | Status: DC

## 2016-01-05 NOTE — Plan of Care (Signed)
Problem: Activity: Goal: Risk for activity intolerance will decrease Outcome: Progressing Pt. walked from room and  around nursing station and back to room with family, and tolerated well.  Problem: Pain Managment: Goal: General experience of comfort will improve Outcome: Progressing Offered pain meds often, and pt. refused.

## 2016-01-05 NOTE — Care Management Note (Signed)
Case Management Note Marvetta Gibbons RN, BSN Unit 2W-Case Manager [phone]  Patient Details  Name: [name] MRN: [id number] Date of Birth: 25-Oct-1930  Subjective/Objective:   Pt admitted s/p lobectomy                  Action/Plan: PTA pt lived at home- plan to return home- will need home 02- order has been placed- and has qualifying note in chart- spoke with Jermaine with [facility] for DME need- portable 02 tank to be delivered to room prior to discharge.   Expected Discharge Date:    01/05/16              Expected Discharge Plan:  Home/Self Care  In-House Referral:     Discharge planning Services  CM Consult  Post Acute Care Choice:  Durable Medical Equipment Choice offered to:  Patient  DME Arranged:  Oxygen DME Agency:  [place]:    [facility] Agency:     Status of Service:  Completed, signed off  If discussed at [place] of Stay Meetings, dates discussed:    Additional Comments:  Dawayne Patricia, RN 01/05/2016, 9:47 AM

## 2016-01-05 NOTE — Progress Notes (Signed)
      [address]       [place] [address]             [phone]      6 Days Post-Op Procedure(s) (LRB): VIDEO ASSISTED THORACOSCOPY (VATS)/RIGHT MIDDLE LOBECTOMY (Right) Subjective: Feels well, sats do decrease to mid-80's only with ambulation- >90 at rest  Objective: Vital signs in last 24 hours: Temp:  [97.3 F (36.3 C)-98.4 F (36.9 C)] 98.4 F (36.9 C) (07/27 0501) Pulse Rate:  [77-80] 80 (07/27 0501) Cardiac Rhythm: Heart block (07/26 1900) Resp:  [20] 20 (07/27 0501) BP: (126-162)/(50-64) 126/64 (07/27 0501) SpO2:  [92 %-95 %] 95 % (07/27 0501)  Hemodynamic parameters for last 24 hours:    Intake/Output from previous day: 07/26 0701 - 07/27 0700 In: 720 [P.O.:720] Out: 1000 [Urine:1000] Intake/Output this shift: No intake/output data recorded.  General appearance: alert, cooperative and no distress Heart: regular rate and rhythm Lungs: min dim in bases Abdomen: benign Extremities: min edema Wound: incis healing well  Lab Results: No results for input(s): WBC, HGB, HCT, PLT in the last 72 hours. BMET: No results for input(s): NA, K, CL, CO2, GLUCOSE, BUN, CREATININE, CALCIUM in the last 72 hours.  PT/INR: No results for input(s): LABPROT, INR in the last 72 hours. ABG    Component Value Date/Time   PHART 7.386 12/31/2015 0400   HCO3 24.8 (H) 12/31/2015 0400   TCO2 26 12/31/2015 0400   ACIDBASEDEF 0.4 12/20/2015 1458   O2SAT 92.0 12/31/2015 0400   CBG (last 3)   Recent Labs  01/04/16 1624 01/04/16 2139 01/05/16 0609  GLUCAP 156* 192* 130*    Meds Scheduled Meds: . amiodarone  200 mg Oral BID  . aspirin EC  81 mg Oral Daily  . atorvastatin  40 mg Oral q1800  . bisacodyl  10 mg Oral Daily  . cholecalciferol  2,000 Units Oral Daily  . enoxaparin (LOVENOX) injection  40 mg Subcutaneous Q24H  . glipiZIDE  2.5 mg Oral BID AC  . insulin aspart  0-24 Units Subcutaneous TID AC & HS  . lisinopril  20 mg Oral Daily  .  loratadine  10 mg Oral Daily  . pantoprazole  40 mg Oral Daily  . polyethylene glycol  17 g Oral Daily  . psyllium  1 packet Oral Daily  . senna-docusate  1 tablet Oral QHS  . terazosin  5 mg Oral QHS   Continuous Infusions: . 0.9 % NaCl with KCl 20 mEq / L 10 mL/hr at 01/02/16 2032   PRN Meds:.levalbuterol, meclizine, oxyCODONE, potassium chloride, traMADol  Xrays No results found.  Assessment/Plan: S/P Procedure(s) (LRB): VIDEO ASSISTED THORACOSCOPY (VATS)/RIGHT MIDDLE LOBECTOMY (Right)  1 appears to qualify for home O2- should only need temporarily- will arrange 2 sinus but QTc>500- will d/c amio at this point  3 BP will benefit from increase in lisinopril 4 increase glipizide dose 5 d/c home   LOS: 6 days    [name] E 01/05/2016

## 2016-01-08 ENCOUNTER — Encounter (HOSPITAL_COMMUNITY): Payer: Self-pay | Admitting: Oncology

## 2016-01-10 ENCOUNTER — Encounter (HOSPITAL_COMMUNITY): Payer: Self-pay | Admitting: Lab

## 2016-01-10 ENCOUNTER — Encounter (HOSPITAL_COMMUNITY): Payer: Self-pay | Admitting: Hematology & Oncology

## 2016-01-10 ENCOUNTER — Encounter (HOSPITAL_COMMUNITY): Payer: Medicare Other | Attending: Hematology & Oncology | Admitting: Hematology & Oncology

## 2016-01-10 VITALS — BP 151/60 | HR 71 | Temp 97.9°F | Resp 18 | Wt 203.0 lb

## 2016-01-10 DIAGNOSIS — C3491 Malignant neoplasm of unspecified part of right bronchus or lung: Secondary | ICD-10-CM

## 2016-01-10 DIAGNOSIS — D649 Anemia, unspecified: Secondary | ICD-10-CM | POA: Insufficient documentation

## 2016-01-10 DIAGNOSIS — R918 Other nonspecific abnormal finding of lung field: Secondary | ICD-10-CM | POA: Insufficient documentation

## 2016-01-10 DIAGNOSIS — E538 Deficiency of other specified B group vitamins: Secondary | ICD-10-CM | POA: Diagnosis not present

## 2016-01-10 DIAGNOSIS — Z902 Acquired absence of lung [part of]: Secondary | ICD-10-CM

## 2016-01-10 DIAGNOSIS — C342 Malignant neoplasm of middle lobe, bronchus or lung: Secondary | ICD-10-CM | POA: Diagnosis present

## 2016-01-10 NOTE — Progress Notes (Signed)
[facility] Hematology/Oncology Consultation   Name: [name]      MRN: [id number]    Date: 01/10/2016 Time:2:38 PM   REFERRING PHYSICIAN:  Deberah Castle, NP (GI)  REASON FOR CONSULT:  "Lung Cancer"   DIAGNOSIS:      Adenocarcinoma of right lung, stage 3 ([name])   11/10/2015 Imaging    13 mm spiculated lesion seen in right middle lobe.      11/30/2015 PET scan    Spiculated right middle lobe nodule is mildly hypermetabolic and most consistent with a stage IA adenocarcinoma.      12/30/2015 Surgery    Right middle lobectomy and node dissection      12/30/2015 Procedure    Right video-assisted thoracoscopy, Thoracoscopic right middle lobectomy, Mediastinal lymph node dissection, and On-Q local anesthetic catheter placement.      01/02/2016 Pathology Results    1. Lung, resection (segmental or lobe), Right Middle Lobe - INVASIVE ADENOCARCINOMA, WELL DIFFERENTIATED, SPANNING 1.2 CM. - THE SURGICAL RESECTION MARGINS ARE NEGATIVE FOR CARCINOMA. 2. Lymph node, biopsy, Level 12 - METASTATIC CARCINOMA IN 1 OF 1 LYMPH NODE (1/1). 3. Lymph node, biopsy, Level 12 #2 - METASTATIC CARCINOMA IN 1 OF 1 LYMPH NODE (1/1). 4. Lymph node, biopsy, Level 7 - METASTATIC CARCINOMA IN 1 OF 1 LYMPH NODE (1/1/). 5. Lymph node, biopsy, Level 7 #2 - METASTATIC CARCINOMA IN 1 OF 1 LYMPH NODE (1/1). 6. Lymph node, biopsy, Level 7 #3 - METASTATIC CARCINOMA IN 1 OF 1 LYMPH NODE (1/1). 7. Lymph node, biopsy, Level 7 #4 - METASTATIC CARCINOMA IN 1 OF 1 LYMPH NODE (1/1). 8. Lymph node, biopsy, 4R - THERE IS NO EVIDENCE OF CARCINOMA IN 1 OF 1 LYMPH NODE (0/1). 9. Lymph node, biopsy, 4R #2 - THERE IS NO EVIDENCE OF CARCINOMA IN 1 OF 1 LYMPH NODE (0/1).      01/05/2016 Pathology Results    PDL1 NEGATIVE- tumor proportion score of 0%.       01/05/2016 Pathology Results    Genomic alterations identified: BRAF V600E, KIT amplification, PDGFRA amplification, CDKN2A/B loss, TP53 S50f*33.   Additional findings: MSI-STABLE.  No reportable alterations identified: EGFR, KRAS, ALK, MET, RET, ERBB2, ROS1         HISTORY OF PRESENT ILLNESS:   [name] a 82y.o. male with a medical history significant for DM, hypercholesterolemia, and tobacco abuse [facility]  [name] accompanied by his wife. I personally reviewed and went over pathology results with the patient and his wife.  He had surgery last week. His wife has been making him walk and looks after him. States he was due for a B12 shot the day of surgery and they wouldn't give it to him. He is scheduled to follow up with Dr. HRoxan Hockeynext week.   Reports his heart has been doing well since surgery. He did develop atrial fibrillation post operative but converted to NSR with amiodarone. He was also unfortunately found to have stage III A disease with N2 nodal disease.   He reports foot swelling since surgery. His wife gave him half a Lasix of hers to help.   He has only taken Tylenol for pain since leaving the hospital.  States he can walk a little without his walker though not far. He has done remarkably well overall.   The patient is here for further evaluation and discussion of  lung cancer and treatment options.   Review of Systems  Constitutional: Negative for chills, fever, malaise/fatigue and weight loss.  HENT: Negative for congestion, sore throat and tinnitus.   Eyes: Negative for blurred vision and double vision.  Respiratory: Negative for cough, hemoptysis and shortness of breath.   Cardiovascular: Negative for chest pain, palpitations, orthopnea and leg swelling.       Foot swelling since surgery  Gastrointestinal: Negative for abdominal pain, blood in stool, constipation, diarrhea, melena, nausea and vomiting.  Genitourinary: Negative for dysuria and urgency.  Musculoskeletal: Negative.   Skin: Negative for itching and rash.    Neurological: Negative.  Negative for dizziness, sensory change, speech change, focal weakness, seizures, loss of consciousness, weakness and headaches.  Endo/Heme/Allergies: Negative.   Psychiatric/Behavioral: Negative.   14 point review of systems was performed and is negative except as detailed under history of present illness and above    PAST MEDICAL HISTORY:   Past Medical History:  Diagnosis Date  . Arthritis   . B12 deficiency   . Cancer ([place])    lung cancer  . Cyst of scrotum   . Diverticulitis   . GERD (gastroesophageal reflux disease)   . Hernia   . History of pneumonia   . Hyperlipidemia   . Lung nodule    Spiculated right middle lobe nodule, hypermetabolic  . Type 2 diabetes mellitus ([facility])     ALLERGIES: Allergies  Allergen Reactions  . Morphine And Related Other (See Comments)    confusion      MEDICATIONS: I have reviewed the patient's current medications.    Current Outpatient Prescriptions on File Prior to Visit  Medication Sig Dispense Refill  . aspirin EC 81 MG tablet Take 81 mg by mouth daily.    . Cholecalciferol (VITAMIN D) 2000 UNITS CAPS Take 1 capsule by mouth daily.     Marland Kitchen glipiZIDE (GLUCOTROL) 5 MG tablet Take 1 tablet (5 mg total) by mouth 2 (two) times daily before a meal. 60 tablet 1  . lisinopril (PRINIVIL,ZESTRIL) 40 MG tablet Take 1 tablet (40 mg total) by mouth daily. 30 tablet 1  . loratadine (CLARITIN) 10 MG tablet Take 10 mg by mouth daily.    . meclizine (ANTIVERT) 25 MG tablet Take 25 mg by mouth daily as needed for dizziness.    Marland Kitchen oxyCODONE (OXY IR/ROXICODONE) 5 MG immediate release tablet Take 1-2 tablets (5-10 mg total) by mouth every 6 (six) hours as needed for severe pain. 30 tablet 0  . pantoprazole (PROTONIX) 40 MG tablet Take 40 mg by mouth daily.     . polyethylene glycol (MIRALAX / GLYCOLAX) packet Take 17 g by mouth daily.    . psyllium (METAMUCIL) 58.6 % powder Take 1 packet by mouth daily.    . simvastatin (ZOCOR) 80  MG tablet Take 40 mg by mouth at bedtime.     Marland Kitchen terazosin (HYTRIN) 5 MG capsule Take 5 mg by mouth at bedtime.     No current facility-administered medications on file prior to visit.      PAST SURGICAL HISTORY Past Surgical History:  Procedure Laterality Date  . BACK SURGERY    . CHOLECYSTECTOMY    . COLONOSCOPY N/A 11/24/2015   Procedure: COLONOSCOPY;  Surgeon: Rogene Houston, MD;  Location: [facility];  Service: Endoscopy;  Laterality: N/A;  730  . COLONOSCOPY W/ POLYPECTOMY     x 5  . CYST EXCISION     Scrotum  . EYE SURGERY Bilateral  Cataract with Lens  . HERNIA REPAIR Right    Inguinal  . LUMBAR DISC SURGERY     per patient report  . VIDEO ASSISTED THORACOSCOPY (VATS)/ LOBECTOMY Right 12/30/2015   Procedure: VIDEO ASSISTED THORACOSCOPY (VATS)/RIGHT MIDDLE LOBECTOMY;  Surgeon: Melrose Nakayama, MD;  Location: [facility];  Service: Thoracic;  Laterality: Right;    FAMILY HISTORY: Family History  Problem Relation Age of Onset  . Colon cancer Brother   Mother died at the age of 62 secondary to complications associated with childbirth. Fatjer passed in his 37's from a possible stroke versus blood clot. He has 1 brother who is 35 years old He has 1 sister who is 55 years old He has another borther who is 80 years old.   His youngest brother is 8 years old.  1 brother is deceased at the age of 50 secondary to colon cancer.  SOCIAL HISTORY: He is an ex-smoker, quitting about 15 years ago after smoking 0.5ppd x 50 years.  He does not drink EtOH, but he admits that he used to drink EtOH.  He denies any illicit drug abuse.  He denies any religious affiliation.  He is a retired Magazine features editor.  Social History   Social History  . Marital status: Married    Spouse name: N/A  . Number of children: N/A  . Years of education: N/A   Social History Main Topics  . Smoking status: Former Smoker    Types: Cigarettes    Quit date: 05/27/2004  . Smokeless tobacco:  Never Used  . Alcohol use No  . Drug use: No  . Sexual activity: Not on file   Other Topics Concern  . Not on file   Social History Narrative  . No narrative on file    PERFORMANCE STATUS: The patient's performance status is 1 - Symptomatic but completely ambulatory  PHYSICAL EXAM: Most Recent Vital Signs: Blood pressure (!) 151/60, pulse 71, temperature 97.9 F (36.6 C), temperature source Oral, resp. rate 18, weight 203 lb (92.1 kg), SpO2 93 %. BP (!) 151/60   Pulse 71   Temp 97.9 F (36.6 C) (Oral)   Resp 18   Wt 203 lb (92.1 kg)   SpO2 93%   BMI 29.98 kg/m   General Appearance:    Alert, cooperative, no distress, appears stated age, accompanied by his wife.  Head:    Normocephalic, without obvious abnormality, atraumatic  Eyes:    Conjunctiva/corneas clear, EOM's intact       Ears:    External anatomy is normal.  Nose:   Nares normal, mucosa normal, no drainage or sinus tenderness  Throat:   Lips, mucosa, and tongue normal  Neck:   Supple, symmetrical, trachea midline, no adenopathy;       thyroid:  No enlargement/tenderness/nodules  Back:     Symmetric, no curvature, ROM normal, no CVA tenderness  Lungs:     Clear to auscultation bilaterally, respirations unlabored  Chest wall:    No tenderness or deformity Several incision sites all well healed. Steri strips in place. No edema noted.  Heart:    Regular rate and rhythm, S1 and S2 normal, no murmur, rub   or gallop  Abdomen:     Soft, non-tender, bowel sounds active all four quadrants,    no masses, no organomegaly        Extremities:   Extremities normal, atraumatic, no cyanosis or edema  Pulses:   2+ and symmetric all extremities  Skin:  Skin color, texture, turgor normal, no rashes or lesions  Lymph nodes:   Cervical, supraclavicular, and axillary nodes normal  Neurologic:   CNII-XII intact. Normal strength, sensation and reflexes      throughout    LABORATORY DATA:  I have reviewed the data as  listed. CBC    Component Value Date/Time   WBC 10.9 (H) 01/01/2016 0320   RBC 3.86 (L) 01/01/2016 0320   HGB 11.1 (L) 01/01/2016 0320   HCT 35.0 (L) 01/01/2016 0320   PLT 206 01/01/2016 0320   MCV 90.7 01/01/2016 0320   MCH 28.8 01/01/2016 0320   MCHC 31.7 01/01/2016 0320   RDW 13.6 01/01/2016 0320   LYMPHSABS 2.3 12/07/2015 1052   MONOABS 0.9 12/07/2015 1052   EOSABS 0.9 (H) 12/07/2015 1052   BASOSABS 0.1 12/07/2015 1052      Chemistry      Component Value Date/Time   NA 138 01/01/2016 0320   K 4.1 01/01/2016 0320   CL 106 01/01/2016 0320   CO2 28 01/01/2016 0320   BUN 5 (L) 01/01/2016 0320   CREATININE 0.77 01/01/2016 0320   CREATININE 0.95 11/03/2015 1452      Component Value Date/Time   CALCIUM 7.9 (L) 01/01/2016 0320   ALKPHOS 51 01/01/2016 0320   AST 21 01/01/2016 0320   ALT 16 (L) 01/01/2016 0320   BILITOT 0.8 01/01/2016 0320     Lab Results  Component Value Date   IRON 71 12/07/2015   TIBC 302 12/07/2015   FERRITIN 40 12/07/2015   Lab Results  Component Value Date   VITAMINB12 170 (L) 12/07/2015   Lab Results  Component Value Date   FOLATE 39.9 12/07/2015     RADIOGRAPHY: I have personally reviewed the radiological images as listed and agreed with the findings in the report.  Study Result   CLINICAL DATA:  Status post right lobectomy. EXAM: CHEST  2 VIEW COMPARISON:  Portable chest x-ray of January 02, 2016 FINDINGS: There has been interval removal of the right internal jugular venous catheter. A tiny right apical pneumothorax is visible. There is persistent atelectasis or infiltrate in the right mid and lower lung with small right pleural effusion. The left lung is well-expanded. There is a small left pleural effusion. The heart is normal in size. The pulmonary vascularity is not engorged. IMPRESSION: 1. Stable tiny right apical pneumothorax. Interval removal of the right internal jugular venous catheter. 2. Persistent volume loss on the  right with basilar atelectasis or infiltrate, small effusion, and elevated hemidiaphragm. 3. Tiny left pleural effusion. Electronically Signed   By: David  Martinique M.D.   On: 01/03/2016 07:48     PATHOLOGY:     Diagnosis Colon, polyp(s), ascending - TUBULAR ADENOMA(S). - HIGH GRADE DYSPLASIA IS NOT IDENTIFIED. Casimer Lanius MD Pathologist, Electronic Signature (Case signed 11/25/2015)   ASSESSMENT/PLAN:  RML nodule Stage IIIA NSCLC, T1a,N2,M0 Post op Afib RM lobectomy, mediastinal LN dissection  We reviewed the NCCN guidelines. I believe that after some recovery time he could tolerate adjuvant carboplatin/atlimta. I will refer him for radiation consultation although currently there is not an indication for radiation ie. No positive margin and no bulky N2 disease.   He looks fairly good. He was encouraged to continue to slowly increase his physical activity. He has an upcoming follow-up with Dr. Roxan [name]. We will see him back In 2 weeks. If he wishes to proceed with adjuvant chemotherapy we will set him up for port placement and chemotherapy teaching in the  future as well.  B12 deficiency B12 deficiency requiring IM B12 replacement. Continue ongoing replacement.   Can consider oral B12 in the future. Antibody testing for pernicious anemia negative.  All questions were answered. The patient knows to call the clinic with any problems, questions or concerns. We can certainly see the patient much sooner if necessary.  This document serves as a record of services personally performed by Ancil Linsey, MD. It was created on her behalf by Arlyce Harman, a trained medical scribe. The creation of this record is based on the scribe's personal observations and the provider's statements to them. This document has been checked and approved by the attending provider.  I have reviewed the above documentation for accuracy and completeness, and I agree with the above.  This note is  electronically signed [id number] Cyril Mourning, MD  01/10/2016 2:38 PM

## 2016-01-10 NOTE — Patient Instructions (Signed)
[place] at [facility] Discharge Instructions  RECOMMENDATIONS MADE BY THE CONSULTANT AND ANY TEST RESULTS WILL BE SENT TO YOUR REFERRING PHYSICIAN.  Exam done and seen today by Dr. Whitney Muse Will refer to DR.Manning in [place] for Radiation Return to see the doctor in 3 weeks Please call the clinic if you have any questions or concerns  Thank you for choosing [place] at [facility] to provide your oncology and hematology care.  To afford each patient quality time with our provider, please arrive at least 15 minutes before your scheduled appointment time.   Beginning January 23rd 2017 lab work for the [facility] will be done in the  Main lab at [facility] on 1st floor. If you have a lab appointment with the [facility] please come in thru the  Main Entrance and check in at the main information desk  You need to re-schedule your appointment should you arrive 10 or more minutes late.  We strive to give you quality time with our providers, and arriving late affects you and other patients whose appointments are after yours.  Also, if you no show three or more times for appointments you may be dismissed from the clinic at the providers discretion.     Again, thank you for choosing [facility].  Our hope is that these requests will decrease the amount of time that you wait before being seen by our physicians.       _____________________________________________________________  Should you have questions after your visit to [facility], please contact our office at [phone] between the hours of 8:30 a.m. and 4:30 p.m.  Voicemails left after 4:30 p.m. will not be returned until the following business day.  For prescription refill requests, have your pharmacy contact our office.         Resources For Cancer Patients and their Caregivers ? American Cancer Society: Can assist with transportation, wigs, general  needs, runs Look Good Feel Better.        442-338-3804 ? Cancer Care: Provides financial assistance, online support groups, medication/co-pay assistance.  1-800-813-HOPE 3478593381) ? [place] Assists [place] cancer patients and their families through emotional , educational and financial support.  (716)282-8486 ? [place] DSS Where to apply for food stamps, Medicaid and utility assistance. 269-233-3975 ? RCATS: Transportation to medical appointments. 6842149645 ? Social Security Administration: May apply for disability if have a Stage IV cancer. 4353300620 8305666467 ? [employer], Disability and Transit Services: Assists with nutrition, care and transit needs. [place] Support Programs: '@10RELATIVEDAYS'$ @ > Cancer Support Group  2nd Tuesday of the month 1pm-2pm, Journey Room  > Creative Journey  3rd Tuesday of the month 1130am-1pm, Journey Room  > Look Good Feel Better  1st Wednesday of the month 10am-12 noon, Journey Room (Call [place] to register [phone])

## 2016-01-10 NOTE — Progress Notes (Unsigned)
Referral to [facility].  Records faxed on 8/1.  They will contact patient.

## 2016-01-11 MED ORDER — CYANOCOBALAMIN 1000 MCG/ML IJ SOLN
1000.0000 ug | Freq: Once | INTRAMUSCULAR | Status: AC
  Administered 2016-01-10: 1000 ug via INTRAMUSCULAR

## 2016-01-11 NOTE — Progress Notes (Signed)
[name] presents today for injection per MD orders. B12 1,000 mcg administered IM in left Upper Arm. Administration without incident. Patient tolerated well.

## 2016-01-16 ENCOUNTER — Encounter (HOSPITAL_COMMUNITY): Payer: Self-pay

## 2016-01-19 ENCOUNTER — Other Ambulatory Visit: Payer: Self-pay | Admitting: Thoracic Surgery (Cardiothoracic Vascular Surgery)

## 2016-01-19 DIAGNOSIS — C3490 Malignant neoplasm of unspecified part of unspecified bronchus or lung: Secondary | ICD-10-CM

## 2016-01-20 ENCOUNTER — Ambulatory Visit (INDEPENDENT_AMBULATORY_CARE_PROVIDER_SITE_OTHER): Payer: Self-pay | Admitting: Thoracic Surgery (Cardiothoracic Vascular Surgery)

## 2016-01-20 ENCOUNTER — Ambulatory Visit
Admission: RE | Admit: 2016-01-20 | Discharge: 2016-01-20 | Disposition: A | Discharge status: Home / Self Care | Payer: Medicare Other | Source: Ambulatory Visit | Attending: Thoracic Surgery (Cardiothoracic Vascular Surgery) | Admitting: Thoracic Surgery (Cardiothoracic Vascular Surgery)

## 2016-01-20 ENCOUNTER — Encounter: Payer: Self-pay | Admitting: Thoracic Surgery (Cardiothoracic Vascular Surgery)

## 2016-01-20 VITALS — BP 146/72 | HR 80 | Resp 16 | Ht 69.0 in | Wt 203.0 lb

## 2016-01-20 DIAGNOSIS — C342 Malignant neoplasm of middle lobe, bronchus or lung: Secondary | ICD-10-CM

## 2016-01-20 DIAGNOSIS — Z902 Acquired absence of lung [part of]: Secondary | ICD-10-CM

## 2016-01-20 DIAGNOSIS — I4891 Unspecified atrial fibrillation: Secondary | ICD-10-CM

## 2016-01-20 DIAGNOSIS — C3490 Malignant neoplasm of unspecified part of unspecified bronchus or lung: Secondary | ICD-10-CM

## 2016-01-20 NOTE — Progress Notes (Signed)
[address]       [place] [address]             [phone]       HPI: [name] returns for a scheduled follow up visit.  [name] is an 82 year old [name] who had a thoracoscopic right middle lobectomy and node dissection on 12/30/2015. [name] had some transient atrial fibrillation postoperatively (less than 24 hours), but otherwise did well and was discharged on day 6.  [name] pathology showed T1a, N2, stage IIIA adenocarcinoma.  [name] says [name] is getting along well. [name]'s not taking any pain medication over the past few days. [name] is walking with a walker. [name] sometimes walks without the walker at home, but does not feel comfortable trying to go without it outside of the house. [name] is not having any chest pain or shortness of breath. [name] had some swelling in [name] feet but that is improved over the past several days.  Past Medical History:  Diagnosis Date  . Arthritis   . B12 deficiency   . Cancer ([place])    lung cancer  . Cyst of scrotum   . Diverticulitis   . GERD (gastroesophageal reflux disease)   . Hernia   . History of pneumonia   . Hyperlipidemia   . Lung nodule    Spiculated right middle lobe nodule, hypermetabolic  . Type 2 diabetes mellitus ([place])      Current Outpatient Prescriptions  Medication Sig Dispense Refill  . aspirin EC 81 MG tablet Take 81 mg by mouth daily.    . Cholecalciferol (VITAMIN D) 2000 UNITS CAPS Take 1 capsule by mouth daily.     . Cyanocobalamin (VITAMIN B-12 IJ) Inject 1 Dose as directed every 30 (thirty) days.    Marland Kitchen glipiZIDE (GLUCOTROL) 5 MG tablet Take 1 tablet (5 mg total) by mouth 2 (two) times daily before a meal. 60 tablet 1  . lisinopril (PRINIVIL,ZESTRIL) 40 MG tablet Take 1 tablet (40 mg total) by mouth daily. 30 tablet 1  . loratadine (CLARITIN) 10 MG tablet Take 10 mg by mouth daily.    . meclizine (ANTIVERT) 25 MG tablet Take 25 mg by mouth daily as needed for dizziness.    Marland Kitchen oxyCODONE (OXY IR/ROXICODONE) 5 MG immediate release  tablet Take 1-2 tablets (5-10 mg total) by mouth every 6 (six) hours as needed for severe pain. 30 tablet 0  . pantoprazole (PROTONIX) 40 MG tablet Take 40 mg by mouth daily.     . polyethylene glycol (MIRALAX / GLYCOLAX) packet Take 17 g by mouth daily.    . psyllium (METAMUCIL) 58.6 % powder Take 1 packet by mouth daily.    . simvastatin (ZOCOR) 80 MG tablet Take 40 mg by mouth at bedtime.     Marland Kitchen terazosin (HYTRIN) 5 MG capsule Take 5 mg by mouth at bedtime.     No current facility-administered medications for this visit.     Physical Exam BP (!) 146/72   Pulse 80   Resp 16   Ht '5\' 9"'$  (1.753 m)   Wt 203 lb (92.1 kg)   SpO2 95% Comment: ON RA  BMI 29.14 kg/m  82 year old [name] in no acute distress Alert and oriented 3 Lungs diminished at right base, otherwise clear Cardiac regular rate and rhythm normal S1 and S2 Incision clean dry and intact Trace edema in both feet  Diagnostic Tests: CHEST  2 VIEW  COMPARISON:  01/03/2016.  FINDINGS: Mediastinum hilar structures normal.  Cardiomegaly with normal pulmonary vascularity. Postsurgical changes right lung with right base atelectasis and or scarring. No pneumothorax. No acute bony abnormality.  IMPRESSION: 1. Postsurgical changes right lung. Right base atelectasis and or scarring. No pneumothorax.  2.  No acute cardiopulmonary disease.  Stable cardiomegaly .   Electronically Signed   By: Marcello Moores  Register   On: 01/20/2016 10:11  I personally reviewed the chest x-ray and concur with the findings noted above.  Impression: [name] is an 82 year old [name] who had a right middle lobectomy for a clinical stage IB, pathological stage IIIA adenocarcinoma. [name] is doing as well as could be expected postoperatively. [name] has minimal discomfort. [name] exercise tolerance is improving. [name] is still using a walker for balance. [name] is not having any significant respiratory issues.  [name] did turn out to have stage IIIA disease. The nodal  disease was occult on preoperative imaging. [name] has already seen Dr. Whitney Muse preoperatively and has an appointment to see her again next week. [name] also has an appointment with Dr. Tammi Klippel. [name] will need adjuvant chemoradiation if they feel [name] is a candidate.  I recommended that [name] not tried driving yet until [name] is more steady.  Other than that [name] activities are unrestricted, but I did caution him to build into new activities gradually.  Plan: Follow-up with Dr. Whitney Muse and Dr. Tammi Klippel  I will see him back in 2 months check on [name] progress. We'll do a PA and lateral chest x-ray at that time.  Melrose Nakayama, MD [facility] [phone]

## 2016-01-27 ENCOUNTER — Encounter (HOSPITAL_COMMUNITY): Payer: Self-pay

## 2016-01-30 ENCOUNTER — Ambulatory Visit (HOSPITAL_COMMUNITY): Payer: Medicare Other

## 2016-02-01 ENCOUNTER — Encounter (HOSPITAL_COMMUNITY): Payer: Self-pay | Admitting: Oncology

## 2016-02-01 ENCOUNTER — Other Ambulatory Visit (HOSPITAL_COMMUNITY): Payer: Self-pay | Admitting: *Deleted

## 2016-02-01 ENCOUNTER — Encounter (HOSPITAL_BASED_OUTPATIENT_CLINIC_OR_DEPARTMENT_OTHER): Payer: Medicare Other | Admitting: Oncology

## 2016-02-01 VITALS — BP 144/57 | HR 72 | Temp 97.7°F | Resp 18 | Wt 200.0 lb

## 2016-02-01 DIAGNOSIS — C342 Malignant neoplasm of middle lobe, bronchus or lung: Secondary | ICD-10-CM | POA: Diagnosis present

## 2016-02-01 DIAGNOSIS — C3491 Malignant neoplasm of unspecified part of right bronchus or lung: Secondary | ICD-10-CM

## 2016-02-01 MED ORDER — LIDOCAINE-PRILOCAINE 2.5-2.5 % EX CREA: TOPICAL_CREAM | CUTANEOUS | 3 refills | Status: DC

## 2016-02-01 MED ORDER — DEXAMETHASONE 4 MG PO TABS: ORAL_TABLET | ORAL | 1 refills | Status: DC

## 2016-02-01 MED ORDER — FOLIC ACID 1 MG PO TABS: ORAL_TABLET | ORAL | 6 refills | Status: DC

## 2016-02-01 MED ORDER — ONDANSETRON HCL 8 MG PO TABS: 8.0000 mg | ORAL_TABLET | Freq: Three times a day (TID) | ORAL | 2 refills | Status: DC | PRN

## 2016-02-01 MED ORDER — PROCHLORPERAZINE MALEATE 10 MG PO TABS: 10.0000 mg | ORAL_TABLET | Freq: Four times a day (QID) | ORAL | 2 refills | Status: DC | PRN

## 2016-02-01 NOTE — Patient Instructions (Signed)
[place] at [facility] Discharge Instructions  RECOMMENDATIONS MADE BY THE CONSULTANT AND ANY TEST RESULTS WILL BE SENT TO YOUR REFERRING PHYSICIAN.  You were seen by Gershon Mussel today Order for [facility] placement [facility] with Anderson Malta  Return for Nadir check 7-10 days post treatment.  Thank you for choosing [place] at [facility] to provide your oncology and hematology care.  To afford each patient quality time with our provider, please arrive at least 15 minutes before your scheduled appointment time.   Beginning January 23rd 2017 lab work for the [facility] will be done in the  Main lab at [facility] on 1st floor. If you have a lab appointment with the [facility] please come in thru the  Main Entrance and check in at the main information desk  You need to re-schedule your appointment should you arrive 10 or more minutes late.  We strive to give you quality time with our providers, and arriving late affects you and other patients whose appointments are after yours.  Also, if you no show three or more times for appointments you may be dismissed from the clinic at the providers discretion.     Again, thank you for choosing [facility].  Our hope is that these requests will decrease the amount of time that you wait before being seen by our physicians.       _____________________________________________________________  Should you have questions after your visit to [facility], please contact our office at [phone] between the hours of 8:30 a.m. and 4:30 p.m.  Voicemails left after 4:30 p.m. will not be returned until the following business day.  For prescription refill requests, have your pharmacy contact our office.         Resources For Cancer Patients and their Caregivers ? American Cancer Society: Can assist with transportation, wigs, general needs, runs Look Good Feel Better.         ([phone] ? Cancer Care: Provides financial assistance, online support groups, medication/co-pay assistance.  1-800-813-HOPE 509 801 2511) ? [place] Assists [place] cancer patients and their families through emotional , educational and financial support.  [phone] ? [place] DSS Where to apply for food stamps, Medicaid and utility assistance. (304)497-8385 ? RCATS: Transportation to medical appointments. (469)680-1514 ? Social Security Administration: May apply for disability if have a Stage IV cancer. 917-727-8052 320-741-4390 ? [employer], Disability and Transit Services: Assists with nutrition, care and transit needs. [place] Support Programs: '@10RELATIVEDAYS'$ @ > Cancer Support Group  2nd Tuesday of the month 1pm-2pm, Journey Room  > Creative Journey  3rd Tuesday of the month 1130am-1pm, Journey Room  > Look Good Feel Better  1st Wednesday of the month 10am-12 noon, Journey Room (Call [place] to register [phone])

## 2016-02-01 NOTE — Progress Notes (Signed)
[facility], [place] [address]  Malignant neoplasm of middle lobe of right lung ([place]) - Plan: IR Fluoro Guide CV Line Left, folic acid (FOLVITE) 1 MG tablet, dexamethasone (DECADRON) 4 MG tablet, CANCELED: IR Removal Tun Access W/ Port W/O FL  Adenocarcinoma of right lung, stage 3 (HCC)  CURRENT THERAPY:  Future adjuvant treatment planning and discussion.  INTERVAL HISTORY: [name] 82 y.o. male returns for followup of Stage IIIA (T1AN2M0) invasive adenocarcinoma, S/P R VATS, RML lobectomy, and mediastinal lymph node dissection by Dr. Roxan Hockey on 12/30/2015.  He tolerated procedure by Dr. Roxan Hockey very well.  He is pleased with Dr. Roxan Hockey and reports that the surgery went well without any issues.  He denies any complaints today, but is very interested in learning his options moving forward.  Review of Systems  Constitutional: Negative.  Negative for chills and fever.  HENT: Negative.   Eyes: Negative.  Negative for blurred vision and double vision.  Respiratory: Negative.  Negative for cough, hemoptysis, sputum production, shortness of breath and wheezing.   Cardiovascular: Negative.  Negative for chest pain.  Gastrointestinal: Negative.  Negative for nausea and vomiting.  Genitourinary: Negative.   Musculoskeletal: Negative.   Skin: Negative.   Neurological: Negative.  Negative for weakness and headaches.  Endo/Heme/Allergies: Negative.   Psychiatric/Behavioral: Negative.     Past Medical History:  Diagnosis Date  . Adenocarcinoma of lung, stage 3 (HCC)    Stage IIIA  . Adenocarcinoma of right lung, stage 3 ([place]) 12/02/2015  . Arthritis   . Atrial fibrillation, transient ([place])    transient postop, < 24 hours  . B12 deficiency   . Cyst of scrotum   . Diverticulitis   . GERD (gastroesophageal reflux disease)   . Hernia   . History of pneumonia   . Hyperlipidemia   . Lung nodule    Spiculated right middle lobe nodule,  hypermetabolic  . Type 2 diabetes mellitus ([place])     Past Surgical History:  Procedure Laterality Date  . BACK SURGERY    . CHOLECYSTECTOMY    . COLONOSCOPY N/A 11/24/2015   Procedure: COLONOSCOPY;  Surgeon: Rogene Houston, MD;  [facility];  Service: Endoscopy;  Laterality: N/A;  730  . COLONOSCOPY W/ POLYPECTOMY     x 5  . CYST EXCISION     Scrotum  . EYE SURGERY Bilateral    Cataract with Lens  . HERNIA REPAIR Right    Inguinal  . LUMBAR DISC SURGERY     per patient report  . VIDEO ASSISTED THORACOSCOPY (VATS)/ LOBECTOMY Right 12/30/2015   Procedure: VIDEO ASSISTED THORACOSCOPY (VATS)/RIGHT MIDDLE LOBECTOMY;  Surgeon: Melrose Nakayama, MD;  Location: [facility];  Service: Thoracic;  Laterality: Right;    Family History  Problem Relation Age of Onset  . Colon cancer Brother     Social History   Social History  . Marital status: Married    Spouse name: N/A  . Number of children: N/A  . Years of education: N/A   Social History Main Topics  . Smoking status: Former Smoker    Types: Cigarettes    Quit date: 05/27/2004  . Smokeless tobacco: Never Used  . Alcohol use No  . Drug use: No  . Sexual activity: Not Asked   Other Topics Concern  . None   Social History Narrative  . None     PHYSICAL EXAMINATION  ECOG PERFORMANCE STATUS: 1 -  Symptomatic but completely ambulatory  Vitals:   02/01/16 1000  BP: (!) 144/57  Pulse: 72  Resp: 18  Temp: 97.7 F (36.5 C)    GENERAL:alert, no distress, well nourished, well developed, comfortable, cooperative, obese, smiling and accompanied by his wife. SKIN: skin color, texture, turgor are normal, no rashes or significant lesions HEAD: Normocephalic, No masses, lesions, tenderness or abnormalities EYES: EOMI EARS: External ears normal OROPHARYNX:lips, buccal mucosa, and tongue normal and mucous membranes are moist  NECK: no adenopathy LYMPH:  no palpable lymphadenopathy BREAST:not examined LUNGS: clear  to auscultation and percussion HEART: regular rate & rhythm ABDOMEN:abdomen soft and normal bowel sounds BACK: Back symmetric, no curvature. EXTREMITIES:less then 2 second capillary refill, no joint deformities, effusion, or inflammation, no skin discoloration, no cyanosis  NEURO: alert & oriented x 3 with fluent speech, no focal motor/sensory deficits, gait normal   LABORATORY DATA: CBC    Component Value Date/Time   WBC 10.9 (H) 01/01/2016 0320   RBC 3.86 (L) 01/01/2016 0320   HGB 11.1 (L) 01/01/2016 0320   HCT 35.0 (L) 01/01/2016 0320   PLT 206 01/01/2016 0320   MCV 90.7 01/01/2016 0320   MCH 28.8 01/01/2016 0320   MCHC 31.7 01/01/2016 0320   RDW 13.6 01/01/2016 0320   LYMPHSABS 2.3 12/07/2015 1052   MONOABS 0.9 12/07/2015 1052   EOSABS 0.9 (H) 12/07/2015 1052   BASOSABS 0.1 12/07/2015 1052      Chemistry      Component Value Date/Time   NA 138 01/01/2016 0320   K 4.1 01/01/2016 0320   CL 106 01/01/2016 0320   CO2 28 01/01/2016 0320   BUN 5 (L) 01/01/2016 0320   CREATININE 0.77 01/01/2016 0320   CREATININE 0.95 11/03/2015 1452      Component Value Date/Time   CALCIUM 7.9 (L) 01/01/2016 0320   ALKPHOS 51 01/01/2016 0320   AST 21 01/01/2016 0320   ALT 16 (L) 01/01/2016 0320   BILITOT 0.8 01/01/2016 0320        PENDING LABS:   RADIOGRAPHIC STUDIES:  Dg Chest 2 View  Result Date: 01/20/2016 CLINICAL DATA:  Right middle lobectomy. EXAM: CHEST  2 VIEW COMPARISON:  01/03/2016. FINDINGS: Mediastinum hilar structures normal. Cardiomegaly with normal pulmonary vascularity. Postsurgical changes right lung with right base atelectasis and or scarring. No pneumothorax. No acute bony abnormality. IMPRESSION: 1. Postsurgical changes right lung. Right base atelectasis and or scarring. No pneumothorax. 2.  No acute cardiopulmonary disease.  Stable cardiomegaly . Electronically Signed   By: Marcello Moores  Register   On: 01/20/2016 10:11   Dg Chest 2 View  Result Date:  01/03/2016 CLINICAL DATA:  Status post right lobectomy. EXAM: CHEST  2 VIEW COMPARISON:  Portable chest x-ray of January 02, 2016 FINDINGS: There has been interval removal of the right internal jugular venous catheter. A tiny right apical pneumothorax is visible. There is persistent atelectasis or infiltrate in the right mid and lower lung with small right pleural effusion. The left lung is well-expanded. There is a small left pleural effusion. The heart is normal in size. The pulmonary vascularity is not engorged. IMPRESSION: 1. Stable tiny right apical pneumothorax. Interval removal of the right internal jugular venous catheter. 2. Persistent volume loss on the right with basilar atelectasis or infiltrate, small effusion, and elevated hemidiaphragm. 3. Tiny left pleural effusion. Electronically Signed   By: David  Martinique M.D.   On: 01/03/2016 07:48    PATHOLOGY:    ASSESSMENT AND PLAN:  Adenocarcinoma of right  lung, stage 3 (HCC) Stage IIIA (T1AN2M0) invasive adenocarcinoma, S/P R VATS, RML lobectomy, and mediastinal lymph node dissection by Dr. Roxan Hockey on 12/30/2015.  Oncology history updated.  Staging in [facility] problem list completed.  I personally reviewed and went over pathology results with the patient.  I reviewed his pathology and staging of his adenocarcinoma lung cancer.  We discussed options moving forward.  His case has been discussed in thoracic tumor board and radiation oncology, Dr. Tammi Klippel has provided recommendations.  Based upon staging and NCCN guidelines, I have offered the patient adjuvant treatment with Carboplatin, AUC 5 day 1 every 21 days and Pemetrexed 500 mg/m2 day 1 every 21 days x 4 cycles.  This will be followed by XRT.  This will all be administered with curative intent.  I have reviewed the risks, benefits, alternatives, and side effects of this intervention including, but not limited to, nausea, vomiting, alopecia, fatigue, diarrhea, constipation, decrease in blood  count, increased risk for infection, anaphylaxis, death.  Treatment plan is built.  He will required B12 per treatment protocol.  He needs a port-a-cath and we discussed this in great detail.  I provided him pictures of the device and educated him on the role of a port-a-cath in the administration of systemic chemotherapy  We have reviewed options for placement of this device.  He has opted to utilize IR for port placement.  He has been referred for chemotherapy teaching.  He will return ~ 7-10 days post treatment for labs and Nadir follow-up appointment.   ORDERS PLACED FOR THIS ENCOUNTER: Orders Placed This Encounter  Procedures  . IR Fluoro Guide CV Line Left    MEDICATIONS PRESCRIBED THIS ENCOUNTER: Meds ordered this encounter  Medications  . folic acid (FOLVITE) 1 MG tablet    Sig: Take 1 tablet daily and then take for 21 days past the completion of pemetrexed chemo.    Dispense:  30 tablet    Refill:  6  . lidocaine-prilocaine (EMLA) cream    Sig: Apply a quarter size amount to port site 1 hour prior to chemo. Do not rub in. Cover with plastic wrap.    Dispense:  30 g    Refill:  3  . prochlorperazine (COMPAZINE) 10 MG tablet    Sig: Take 1 tablet (10 mg total) by mouth every 6 (six) hours as needed for nausea or vomiting.    Dispense:  30 tablet    Refill:  2  . ondansetron (ZOFRAN) 8 MG tablet    Sig: Take 1 tablet (8 mg total) by mouth every 8 (eight) hours as needed for nausea or vomiting.    Dispense:  30 tablet    Refill:  2  . dexamethasone (DECADRON) 4 MG tablet    Sig: The day before, day of, and day after chemo take 2 tablets in the am and 2 tablets in the pm with food.    Dispense:  36 tablet    Refill:  1    THERAPY PLAN:  Once his port is placed and chemotherapy teaching is completed, he will start systemic chemotherapy in the adjuvant setting sequentially with XRT by Dr. Tammi Klippel.  All questions were answered. The patient knows to call the clinic with  any problems, questions or concerns. We can certainly see the patient much sooner if necessary.  Patient and plan discussed with Dr. Ancil Linsey and she is in agreement with the aforementioned.   This note is electronically signed by: Doy Mince 02/01/2016 4:33 PM

## 2016-02-01 NOTE — Assessment & Plan Note (Addendum)
Stage IIIA (T1AN2M0) invasive adenocarcinoma, S/P R VATS, RML lobectomy, and mediastinal lymph node dissection by Dr. Roxan Hockey on 12/30/2015.  Oncology history updated.  Staging in [facility] problem list completed.  I personally reviewed and went over pathology results with the patient.  I reviewed his pathology and staging of his adenocarcinoma lung cancer.  We discussed options moving forward.  His case has been discussed in thoracic tumor board and radiation oncology, Dr. Tammi Klippel has provided recommendations.  Based upon staging and NCCN guidelines, I have offered the patient adjuvant treatment with Carboplatin, AUC 5 day 1 every 21 days and Pemetrexed 500 mg/m2 day 1 every 21 days x 4 cycles.  This will be followed by XRT.  This will all be administered with curative intent.  I have reviewed the risks, benefits, alternatives, and side effects of this intervention including, but not limited to, nausea, vomiting, alopecia, fatigue, diarrhea, constipation, decrease in blood count, increased risk for infection, anaphylaxis, death.  Treatment plan is built.  He will required B12 per treatment protocol.  He needs a port-a-cath and we discussed this in great detail.  I provided him pictures of the device and educated him on the role of a port-a-cath in the administration of systemic chemotherapy  We have reviewed options for placement of this device.  He has opted to utilize IR for port placement.  He has been referred for chemotherapy teaching.  He will return ~ 7-10 days post treatment for labs and Nadir follow-up appointment.

## 2016-02-05 ENCOUNTER — Encounter (HOSPITAL_COMMUNITY): Payer: Self-pay | Admitting: Hematology & Oncology

## 2016-02-07 ENCOUNTER — Ambulatory Visit (HOSPITAL_COMMUNITY): Payer: Medicare Other

## 2016-02-07 ENCOUNTER — Other Ambulatory Visit: Payer: Self-pay | Admitting: Radiology

## 2016-02-08 ENCOUNTER — Other Ambulatory Visit: Payer: Self-pay | Admitting: General Surgery

## 2016-02-08 NOTE — Patient Instructions (Signed)
[name]   CHEMOTHERAPY INSTRUCTIONS  Premeds: Aloxi - high powered nausea/vomiting prevention medication used for chemotherapy patients. Emend - high powered nausea/vomiting prevention medication used for chemotherapy patients. Dexamethasone - steroid - given to reduce the risk of you having an allergic type reaction to the chemotherapy. Dex can cause you to feel energized, nervous/anxious/jittery, make you have trouble sleeping, and/or make you feel hot/flushed in the face/neck and/or look pink/red in the face/neck. These side effects will pass as the Dex wears off. (takes 20 minutes to infuse)     Pemetrexed (Alimta)- bone marrow suppression (lowers white blood cells (fight infection), lowers red blood cells (make up your blood), lowers platelets (help blood to clot). Fatigue, nausea/vomiting, chest pain, shortness of breath. While taking Pemetrexed, we will have you take Folic acid at home. You will need to take Folic acid every day and for 21 days after the completion of Pemetrexed. We will also give you Vitamin B12 injections periodically during your treatments. You will also be taking a steroid (Dexamethasone) while taking your Pemetrexed treatments. You will take this the day before, day of, and day after Pemetrexed. This will decrease the incidence of skin rash. Take it whether you think you need it or not.   (takes 10 minutes to infuse)   Carboplatin - this medication can be hard on your kidneys - this is why we need you to drinking 64 oz of fluid (preferably water/decaff fluids) 2 days prior to chemo and for up to 4-5 days after chemo. Drink more if you can. This will help to keep your kidneys flushed. This can cause mild hair loss, lower your platelets (which keep you from bleeding out when you cut yourself), lower your white blood cells (fight infection), and cause nausea/vomiting. (only takes 30 minutes to infuse)       EDUCATIONAL MATERIALS GIVEN AND  REVIEWED: Chemotherapy and you book, information on alimta and carboplatin   SELF CARE ACTIVITIES WHILE ON CHEMOTHERAPY:   Increase your fluid intake 48 hours prior to treatment and drink at least 2 quarts (64 oz of water/decaff beverages) per day after treatment. No alcohol intake. No aspirin or other medications unless approved by your oncologist. Eat foods that are light and easy to digest. Eat foods at cold or room temperature (as long as you aren't on the drug Oxaliplatin). No fried, fatty, or spicy foods immediately before or after treatment. Have teeth cleaned professionally before starting treatment. Keep dentures and partial plates clean. Use soft toothbrush and do not use mouthwashes that contain alcohol. Biotene is a good mouthwash that is available at most pharmacies or may be ordered by calling [phone]. Use warm salt water gargles (1 teaspoon salt per 1 quart warm water) before and after meals and at bedtime. Or you may rinse with 2 tablespoons of three-percent hydrogen peroxide mixed in eight ounces of water. Always use sunscreen that has not expired and with SPF (Sun Protection Factor) of 50 or higher. Wear hats to protect your head from the sun. Remember to use sunscreen on your hands, ears, face, & feet. Use your nausea medication as directed to prevent nausea. Use your stool softener or laxative as directed to prevent constipation. Use your anti-diarrheal medication as directed to stop diarrhea.   Please wash your hands for at least 30 seconds using warm soapy water. Handwashing is the #1 way to prevent the spread of germs. Stay away from sick people or people who are getting over a cold.  If you develop respiratory systems such as green/yellow mucus production or productive cough or persistent cough let us know and we will see if you need an antibiotic. It is a good idea to keep a pair of gloves on when going into grocery stores/[employer] to decrease your risk of coming into contact  with germs on the carts, etc. Carry alcohol hand gel with you at all times and use it frequently if out in public. All foods need to be cooked thoroughly. No raw foods. No medium or undercooked meats, eggs. If your food is cooked medium well, it does not need to be hot pink or saturated with bloody liquid at all. Vegetables and fruits need to be washed/rinsed under the faucet with a dish detergent before being consumed. You can eat raw fruits and vegetables unless we tell you otherwise but it would be best if you cooked them or bought frozen. Do not eat off of salad bars or hot bars unless you really trust the cleanliness of the restaurant. If you need dental work, please let Dr. Whitney Muse know before you go for your appointment so that we can coordinate the best possible time for you in regards to your chemo regimen. You need to also let your dentist know that you are actively taking chemo. We may need to do labs prior to your dental appointment. We also want your bowels moving at least every other day. If this is not happening, we need to know so that we can get you on a bowel regimen to help you go. If you are going to have sex, a condom must be used to protect the person that isn't taking chemotherapy. Chemo can decrease your libido (sex drive).    MEDICATIONS:   Folic Acid '1mg'$ : Take 1 tablet daily and then take for 21 days past the completion of pemetrexed chemo  Dexamethasone '4mg'$  tablet. The day before, day of, and day after chemo take 2 tablets in the am and 2 tablets in the pm with food                                                                                                                                                              Zofran/Ondansetron '8mg'$  tablet. Take 1 tablet every 8 hours as needed for nausea/vomiting. (#1 nausea med to take, this can constipate)  Compazine/Prochlorperazine '10mg'$  tablet. Take 1 tablet every 6 hours as needed for nausea/vomiting. (#2 nausea med to take,  this can make you sleepy)   EMLA cream. Apply a quarter size amount to port site 1 hour prior to chemo. Do not rub in. Cover with plastic wrap.   Over-the-Counter Meds:  Miralax 1 capful in 8 oz of fluid daily. May increase to two times a day if needed. This is a  stool softener. If this doesn't work proceed you can add:  Senokot S  - start with 1 tablet two times a day and increase to 4 tablets two times a day if needed. (total of 8 tablets in a 24 hour period). This is a stimulant laxative.   Call us if this does not help your bowels move.   Imodium '2mg'$  capsule. Take 2 capsules after the 1st loose stool and then 1 capsule every 2 hours until you go a total of 12 hours without having a loose stool. Call the [facility] if loose stools continue. If diarrhea occurs @ bedtime, take 2 capsules @ bedtime. Then take 2 capsules every 4 hours until morning. Call [facility].       (Please refer to/review other teaching materials that have been provided to you in this blue folder - What to Know During Chemo, What to know After Chemo, Dr. Donald Pore Advice, Constipation Sheet, Diarrhea Sheet, Nausea Sheet, Self Care Activities While on Chemo)     SYMPTOMS TO REPORT AS SOON AS POSSIBLE AFTER TREATMENT:  FEVER GREATER THAN 100.5 F  CHILLS WITH OR WITHOUT FEVER  NAUSEA AND VOMITING THAT IS NOT CONTROLLED WITH YOUR NAUSEA MEDICATION  UNUSUAL SHORTNESS OF BREATH  UNUSUAL BRUISING OR BLEEDING  TENDERNESS IN MOUTH AND THROAT WITH OR WITHOUT PRESENCE OF ULCERS  URINARY PROBLEMS  BOWEL PROBLEMS  UNUSUAL RASH    Wear comfortable clothing and clothing appropriate for easy access to any Portacath or PICC line. Let us know if there is anything that we can do to make your therapy better!      I have been informed and understand all of the instructions given to me and have received a copy. I have been instructed to call the clinic [phone])  or my family physician as soon as possible for  continued medical care, if indicated. I do not have any more questions at this time but understand that I may call the [facility] at [phone]) during office hours should I have questions or need assistance in obtaining follow-up care.          Carboplatin injection What is this medicine? CARBOPLATIN (KAR boe pla tin) is a chemotherapy drug. It targets fast dividing cells, like cancer cells, and causes these cells to die. This medicine is used to treat ovarian cancer and many other cancers. This medicine may be used for other purposes; ask your health care provider or pharmacist if you have questions. What should I tell my health care provider before I take this medicine? They need to know if you have any of these conditions: -blood disorders -hearing problems -kidney disease -recent or ongoing radiation therapy -an unusual or allergic reaction to carboplatin, cisplatin, other chemotherapy, other medicines, foods, dyes, or preservatives -pregnant or trying to get pregnant -breast-feeding How should I use this medicine? This drug is usually given as an infusion into a vein. It is administered in a hospital or clinic by a specially trained health care professional. Talk to your pediatrician regarding the use of this medicine in children. Special care may be needed. Overdosage: If you think you have taken too much of this medicine contact a poison control center or emergency room at once. NOTE: This medicine is only for you. Do not share this medicine with others. What if I miss a dose? It is important not to miss a dose. Call your doctor or health care professional if you are unable to keep an appointment. What may interact  with this medicine? -medicines for seizures -medicines to increase blood counts like filgrastim, pegfilgrastim, sargramostim -some antibiotics like amikacin, gentamicin, neomycin, streptomycin, tobramycin -vaccines Talk to your doctor or health care professional  before taking any of these medicines: -acetaminophen -aspirin -ibuprofen -ketoprofen -naproxen This list may not describe all possible interactions. Give your health care provider a list of all the medicines, herbs, non-prescription drugs, or dietary supplements you use. Also tell them if you smoke, drink alcohol, or use illegal drugs. Some items may interact with your medicine. What should I watch for while using this medicine? Your condition will be monitored carefully while you are receiving this medicine. You will need important blood work done while you are taking this medicine. This drug may make you feel generally unwell. This is not uncommon, as chemotherapy can affect healthy cells as well as cancer cells. Report any side effects. Continue your course of treatment even though you feel ill unless your doctor tells you to stop. In some cases, you may be given additional medicines to help with side effects. Follow all directions for their use. Call your doctor or health care professional for advice if you get a fever, chills or sore throat, or other symptoms of a cold or flu. Do not treat yourself. This drug decreases your body's ability to fight infections. Try to avoid being around people who are sick. This medicine may increase your risk to bruise or bleed. Call your doctor or health care professional if you notice any unusual bleeding. Be careful brushing and flossing your teeth or using a toothpick because you may get an infection or bleed more easily. If you have any dental work done, tell your dentist you are receiving this medicine. Avoid taking products that contain aspirin, acetaminophen, ibuprofen, naproxen, or ketoprofen unless instructed by your doctor. These medicines may hide a fever. Do not become pregnant while taking this medicine. Women should inform their doctor if they wish to become pregnant or think they might be pregnant. There is a potential for serious side effects to  an unborn child. Talk to your health care professional or pharmacist for more information. Do not breast-feed an infant while taking this medicine. What side effects may I notice from receiving this medicine? Side effects that you should report to your doctor or health care professional as soon as possible: -allergic reactions like skin rash, itching or hives, swelling of the face, lips, or tongue -signs of infection - fever or chills, cough, sore throat, pain or difficulty passing urine -signs of decreased platelets or bleeding - bruising, pinpoint red spots on the skin, black, tarry stools, nosebleeds -signs of decreased red blood cells - unusually weak or tired, fainting spells, lightheadedness -breathing problems -changes in hearing -changes in vision -chest pain -high blood pressure -low blood counts - This drug may decrease the number of white blood cells, red blood cells and platelets. You may be at increased risk for infections and bleeding. -nausea and vomiting -pain, swelling, redness or irritation at the injection site -pain, tingling, numbness in the hands or feet -problems with balance, talking, walking -trouble passing urine or change in the amount of urine Side effects that usually do not require medical attention (report to your doctor or health care professional if they continue or are bothersome): -hair loss -loss of appetite -metallic taste in the mouth or changes in taste This list may not describe all possible side effects. Call your doctor for medical advice about side effects. You may report side  effects to FDA at 1-800-FDA-1088. Where should I keep my medicine? This drug is given in a hospital or clinic and will not be stored at home. NOTE: This sheet is a summary. It may not cover all possible information. If you have questions about this medicine, talk to your doctor, pharmacist, or health care provider.    2016, Elsevier/Gold Standard. (2007-09-02  14:38:05)        Pemetrexed injection What is this medicine? PEMETREXED (PEM e TREX ed) is a chemotherapy drug. This medicine affects cells that are rapidly growing, such as cancer cells and cells in your mouth and stomach. It is usually used to treat lung cancers like non-small cell lung cancer and mesothelioma. It may also be used to treat other cancers. This medicine may be used for other purposes; ask your health care provider or pharmacist if you have questions. What should I tell my health care provider before I take this medicine? They need to know if you have any of these conditions: -if you frequently drink alcohol containing beverages -infection (especially a virus infection such as chickenpox, cold sores, or herpes) -kidney disease -liver disease -low blood counts, like low platelets, red bloods, or white blood cells -an unusual or allergic reaction to pemetrexed, mannitol, other medicines, foods, dyes, or preservatives -pregnant or trying to get pregnant -breast-feeding How should I use this medicine? This drug is given as an infusion into a vein. It is administered in a hospital or clinic by a specially trained health care professional. Talk to your pediatrician regarding the use of this medicine in children. Special care may be needed. Overdosage: If you think you have taken too much of this medicine contact a poison control center or emergency room at once. NOTE: This medicine is only for you. Do not share this medicine with others. What if I miss a dose? It is important not to miss your dose. Call your doctor or health care professional if you are unable to keep an appointment. What may interact with this medicine? -aspirin and aspirin-like medicines -medicines to increase blood counts like filgrastim, pegfilgrastim, sargramostim -methotrexate -NSAIDS, medicines for pain and inflammation, like ibuprofen or naproxen -probenecid -pyrimethamine -vaccines Talk to your  doctor or health care professional before taking any of these medicines: -acetaminophen -aspirin -ibuprofen -ketoprofen -naproxen This list may not describe all possible interactions. Give your health care provider a list of all the medicines, herbs, non-prescription drugs, or dietary supplements you use. Also tell them if you smoke, drink alcohol, or use illegal drugs. Some items may interact with your medicine. What should I watch for while using this medicine? Visit your doctor for checks on your progress. This drug may make you feel generally unwell. This is not uncommon, as chemotherapy can affect healthy cells as well as cancer cells. Report any side effects. Continue your course of treatment even though you feel ill unless your doctor tells you to stop. In some cases, you may be given additional medicines to help with side effects. Follow all directions for their use. Call your doctor or health care professional for advice if you get a fever, chills or sore throat, or other symptoms of a cold or flu. Do not treat yourself. This drug decreases your body's ability to fight infections. Try to avoid being around people who are sick. This medicine may increase your risk to bruise or bleed. Call your doctor or health care professional if you notice any unusual bleeding. Be careful brushing and flossing your teeth  or using a toothpick because you may get an infection or bleed more easily. If you have any dental work done, tell your dentist you are receiving this medicine. Avoid taking products that contain aspirin, acetaminophen, ibuprofen, naproxen, or ketoprofen unless instructed by your doctor. These medicines may hide a fever. Call your doctor or health care professional if you get diarrhea or mouth sores. Do not treat yourself. To protect your kidneys, drink water or other fluids as directed while you are taking this medicine. Men and women must use effective birth control while taking this  medicine. You may also need to continue using effective birth control for a time after stopping this medicine. Do not become pregnant while taking this medicine. Tell your doctor right away if you think that you or your partner might be pregnant. There is a potential for serious side effects to an unborn child. Talk to your health care professional or pharmacist for more information. Do not breast-feed an infant while taking this medicine. This medicine may lower sperm counts. What side effects may I notice from receiving this medicine? Side effects that you should report to your doctor or health care professional as soon as possible: -allergic reactions like skin rash, itching or hives, swelling of the face, lips, or tongue -low blood counts - this medicine may decrease the number of white blood cells, red blood cells and platelets. You may be at increased risk for infections and bleeding. -signs of infection - fever or chills, cough, sore throat, pain or difficulty passing urine -signs of decreased platelets or bleeding - bruising, pinpoint red spots on the skin, black, tarry stools, blood in the urine -signs of decreased red blood cells - unusually weak or tired, fainting spells, lightheadedness -breathing problems, like a dry cough -changes in emotions or moods -chest pain -confusion -diarrhea -high blood pressure -mouth or throat sores or ulcers -pain, swelling, warmth in the leg -pain on swallowing -swelling of the ankles, feet, hands -trouble passing urine or change in the amount of urine -vomiting -yellowing of the eyes or skin Side effects that usually do not require medical attention (report to your doctor or health care professional if they continue or are bothersome): -hair loss -loss of appetite -nausea -stomach upset This list may not describe all possible side effects. Call your doctor for medical advice about side effects. You may report side effects to FDA at  1-800-FDA-1088. Where should I keep my medicine? This drug is given in a hospital or clinic and will not be stored at home. NOTE: This sheet is a summary. It may not cover all possible information. If you have questions about this medicine, talk to your doctor, pharmacist, or health care provider.    2016, Elsevier/Gold Standard. (2007-12-30 13:24:03)

## 2016-02-09 ENCOUNTER — Other Ambulatory Visit (HOSPITAL_COMMUNITY): Payer: Self-pay | Admitting: Oncology

## 2016-02-09 ENCOUNTER — Ambulatory Visit (HOSPITAL_COMMUNITY)
Admission: RE | Admit: 2016-02-09 | Discharge: 2016-02-09 | Disposition: A | Discharge status: Home / Self Care | Payer: Medicare Other | Source: Ambulatory Visit | Attending: Oncology | Admitting: Oncology

## 2016-02-09 ENCOUNTER — Encounter (HOSPITAL_COMMUNITY): Payer: Self-pay

## 2016-02-09 DIAGNOSIS — E785 Hyperlipidemia, unspecified: Secondary | ICD-10-CM | POA: Insufficient documentation

## 2016-02-09 DIAGNOSIS — Z7982 Long term (current) use of aspirin: Secondary | ICD-10-CM | POA: Diagnosis not present

## 2016-02-09 DIAGNOSIS — Z800 Family history of malignant neoplasm of digestive organs: Secondary | ICD-10-CM | POA: Diagnosis not present

## 2016-02-09 DIAGNOSIS — M1990 Unspecified osteoarthritis, unspecified site: Secondary | ICD-10-CM | POA: Insufficient documentation

## 2016-02-09 DIAGNOSIS — E538 Deficiency of other specified B group vitamins: Secondary | ICD-10-CM | POA: Insufficient documentation

## 2016-02-09 DIAGNOSIS — Z902 Acquired absence of lung [part of]: Secondary | ICD-10-CM | POA: Diagnosis not present

## 2016-02-09 DIAGNOSIS — C342 Malignant neoplasm of middle lobe, bronchus or lung: Secondary | ICD-10-CM

## 2016-02-09 DIAGNOSIS — K219 Gastro-esophageal reflux disease without esophagitis: Secondary | ICD-10-CM | POA: Diagnosis not present

## 2016-02-09 DIAGNOSIS — Z9221 Personal history of antineoplastic chemotherapy: Secondary | ICD-10-CM | POA: Insufficient documentation

## 2016-02-09 DIAGNOSIS — I4891 Unspecified atrial fibrillation: Secondary | ICD-10-CM | POA: Diagnosis not present

## 2016-02-09 DIAGNOSIS — E119 Type 2 diabetes mellitus without complications: Secondary | ICD-10-CM | POA: Insufficient documentation

## 2016-02-09 DIAGNOSIS — C3490 Malignant neoplasm of unspecified part of unspecified bronchus or lung: Secondary | ICD-10-CM | POA: Diagnosis present

## 2016-02-09 DIAGNOSIS — Z87891 Personal history of nicotine dependence: Secondary | ICD-10-CM | POA: Insufficient documentation

## 2016-02-09 HISTORY — PX: IR GENERIC HISTORICAL: IMG1180011

## 2016-02-09 LAB — CBC WITH DIFFERENTIAL/PLATELET
Basophils Absolute: 0 10*3/uL (ref 0.0–0.1)
Basophils Relative: 0 %
Eosinophils Absolute: 0.6 10*3/uL (ref 0.0–0.7)
Eosinophils Relative: 6 %
HCT: 38.5 % — ABNORMAL LOW (ref 39.0–52.0)
Hemoglobin: 13.1 g/dL (ref 13.0–17.0)
Lymphocytes Relative: 35 %
Lymphs Abs: 3.2 10*3/uL (ref 0.7–4.0)
MCH: 30.3 pg (ref 26.0–34.0)
MCHC: 34 g/dL (ref 30.0–36.0)
MCV: 89.1 fL (ref 78.0–100.0)
Monocytes Absolute: 0.8 10*3/uL (ref 0.1–1.0)
Monocytes Relative: 9 %
Neutro Abs: 4.5 10*3/uL (ref 1.7–7.7)
Neutrophils Relative %: 50 %
Platelets: 256 10*3/uL (ref 150–400)
RBC: 4.32 MIL/uL (ref 4.22–5.81)
RDW: 13.2 % (ref 11.5–15.5)
WBC: 9.1 10*3/uL (ref 4.0–10.5)

## 2016-02-09 LAB — GLUCOSE, CAPILLARY: Glucose-Capillary: 78 mg/dL (ref 65–99)

## 2016-02-09 LAB — PROTIME-INR
INR: 1
Prothrombin Time: 13.2 seconds (ref 11.4–15.2)

## 2016-02-09 MED ORDER — CEFAZOLIN SODIUM-DEXTROSE 2-4 GM/100ML-% IV SOLN
2.0000 g | INTRAVENOUS | Status: AC
  Administered 2016-02-09: 2 g via INTRAVENOUS
  Filled 2016-02-09: qty 100

## 2016-02-09 MED ORDER — FENTANYL CITRATE (PF) 100 MCG/2ML IJ SOLN
INTRAMUSCULAR | Status: AC
  Filled 2016-02-09: qty 2

## 2016-02-09 MED ORDER — HEPARIN SOD (PORK) LOCK FLUSH 100 UNIT/ML IV SOLN
INTRAVENOUS | Status: AC
  Filled 2016-02-09: qty 5

## 2016-02-09 MED ORDER — HEPARIN SOD (PORK) LOCK FLUSH 100 UNIT/ML IV SOLN
INTRAVENOUS | Status: AC | PRN
  Administered 2016-02-09: 500 [IU] via INTRAVENOUS

## 2016-02-09 MED ORDER — LIDOCAINE-EPINEPHRINE (PF) 2 %-1:200000 IJ SOLN
INTRAMUSCULAR | Status: AC | PRN
  Administered 2016-02-09: 20 mL

## 2016-02-09 MED ORDER — SODIUM CHLORIDE 0.9 % IV SOLN
INTRAVENOUS | Status: DC
  Administered 2016-02-09: 13:00:00 via INTRAVENOUS

## 2016-02-09 MED ORDER — MIDAZOLAM HCL 2 MG/2ML IJ SOLN
INTRAMUSCULAR | Status: AC
  Filled 2016-02-09: qty 2

## 2016-02-09 MED ORDER — FLUMAZENIL 0.5 MG/5ML IV SOLN
INTRAVENOUS | Status: AC
  Filled 2016-02-09: qty 5

## 2016-02-09 MED ORDER — MIDAZOLAM HCL 2 MG/2ML IJ SOLN
INTRAMUSCULAR | Status: AC | PRN
  Administered 2016-02-09: 1 mg via INTRAVENOUS

## 2016-02-09 MED ORDER — LIDOCAINE-EPINEPHRINE (PF) 2 %-1:200000 IJ SOLN
INTRAMUSCULAR | Status: AC
  Filled 2016-02-09: qty 20

## 2016-02-09 MED ORDER — NALOXONE HCL 0.4 MG/ML IJ SOLN
INTRAMUSCULAR | Status: AC
  Filled 2016-02-09: qty 1

## 2016-02-09 MED ORDER — FENTANYL CITRATE (PF) 100 MCG/2ML IJ SOLN
INTRAMUSCULAR | Status: AC | PRN
  Administered 2016-02-09: 50 ug via INTRAVENOUS

## 2016-02-09 MED ORDER — LIDOCAINE HCL 1 % IJ SOLN
INTRAMUSCULAR | Status: AC
  Filled 2016-02-09: qty 20

## 2016-02-09 NOTE — Procedures (Signed)
R IJ Port cathter placement with US and fluoroscopy No complication No blood loss. See complete dictation in [facility] PACS.  

## 2016-02-09 NOTE — Discharge Instructions (Addendum)
Moderate Conscious Sedation, Adult, Care After °Refer to this sheet in the next few weeks. These instructions provide you with information on caring for yourself after your procedure. Your health care provider may also give you more specific instructions. Your treatment has been planned according to current medical practices, but problems sometimes occur. Call your health care provider if you have any problems or questions after your procedure. °WHAT TO EXPECT AFTER THE PROCEDURE  °After your procedure: °· You may feel sleepy, clumsy, and have poor balance for several hours. °· Vomiting may occur if you eat too soon after the procedure. °HOME CARE INSTRUCTIONS °· Do not participate in any activities where you could become injured for at least 24 hours. Do not: °¨ Drive. °¨ Swim. °¨ Ride a bicycle. °¨ Operate heavy machinery. °¨ Cook. °¨ Use power tools. °¨ Climb ladders. °¨ Work from a high place. °· Do not make important decisions or sign legal documents until you are improved. °· If you vomit, drink water, juice, or soup when you can drink without vomiting. Make sure you have little or no nausea before eating solid foods. °· Only take over-the-counter or prescription medicines for pain, discomfort, or fever as directed by your health care provider. °· Make sure you and your family fully understand everything about the medicines given to you, including what side effects may occur. °· You should not drink alcohol, take sleeping pills, or take medicines that cause drowsiness for at least 24 hours. °· If you smoke, do not smoke without supervision. °· If you are feeling better, you may resume normal activities 24 hours after you were sedated. °· Keep all appointments with your health care provider. °SEEK MEDICAL CARE IF: °· Your skin is pale or bluish in color. °· You continue to feel nauseous or vomit. °· Your pain is getting worse and is not helped by medicine. °· You have bleeding or swelling. °· You are still  sleepy or feeling clumsy after 24 hours. °SEEK IMMEDIATE MEDICAL CARE IF: °· You develop a rash. °· You have difficulty breathing. °· You develop any type of allergic problem. °· You have a fever. °MAKE SURE YOU: °· Understand these instructions. °· Will watch your condition. °· Will get help right away if you are not doing well or get worse. °  °This information is not intended to replace advice given to you by your health care provider. Make sure you discuss any questions you have with your health care provider. °  °Document Released: 03/18/2013 Document Revised: 06/18/2014 Document Reviewed: 03/18/2013 °Elsevier Interactive Patient Education ©2016 Elsevier Inc. °Implanted Port Insertion, Care After °Refer to this sheet in the next few weeks. These instructions provide you with information on caring for yourself after your procedure. Your health care provider may also give you more specific instructions. Your treatment has been planned according to current medical practices, but problems sometimes occur. Call your health care provider if you have any problems or questions after your procedure. °WHAT TO EXPECT AFTER THE PROCEDURE °After your procedure, it is typical to have the following:  °· Discomfort at the port insertion site. Ice packs to the area will help. °· Bruising on the skin over the port. This will subside in 3-4 days. °HOME CARE INSTRUCTIONS °· After your port is placed, you will get a manufacturer's information card. The card has information about your port. Keep this card with you at all times.   °· Know what kind of port you have. There are many types   of ports available.   Wear a medical alert bracelet in case of an emergency. This can help alert health care workers that you have a port.   The port can stay in for as long as your health care provider believes it is necessary.   A home health care nurse may give medicines and take care of the port.   You or a family member can get  special training and directions for giving medicine and taking care of the port at home.  SEEK MEDICAL CARE IF:   Your port does not flush or you are unable to get a blood return.   You have a fever or chills. SEEK IMMEDIATE MEDICAL CARE IF:  You have new fluid or pus coming from your incision.   You notice a bad smell coming from your incision site.   You have swelling, pain, or more redness at the incision or port site.   You have chest pain or shortness of breath.   This information is not intended to replace advice given to you by your health care provider. Make sure you discuss any questions you have with your health care provider.   Document Released: 03/18/2013 Document Revised: 06/02/2013 Document Reviewed: 03/18/2013 Elsevier Interactive Patient Education [employer].

## 2016-02-09 NOTE — Consult Note (Signed)
Chief Complaint: Patient was seen in consultation today for Port-A-Cath placement  Referring Physician(s): [facility]  Supervising Physician: Arne Cleveland  Patient Status: Outpatient  History of Present Illness: [name] is a 82 y.o. 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 He presents today for Port-A-Cath placement for planned chemotherapy.  Past Medical History:  Diagnosis Date  . Adenocarcinoma of lung, stage 3 (HCC)    Stage IIIA  . Adenocarcinoma of right lung, stage 3 ([place]) 12/02/2015  . Arthritis   . Atrial fibrillation, transient ([place])    transient postop, < 24 hours  . B12 deficiency   . Cyst of scrotum   . Diverticulitis   . GERD (gastroesophageal reflux disease)   . Hernia   . History of pneumonia   . Hyperlipidemia   . Lung nodule    Spiculated right middle lobe nodule, hypermetabolic  . Type 2 diabetes mellitus ([place])     Past Surgical History:  Procedure Laterality Date  . BACK SURGERY    . CHOLECYSTECTOMY    . COLONOSCOPY N/A 11/24/2015   Procedure: COLONOSCOPY;  Surgeon: Rogene Houston, MD;  [facility];  Service: Endoscopy;  Laterality: N/A;  730  . COLONOSCOPY W/ POLYPECTOMY     x 5  . CYST EXCISION     Scrotum  . EYE SURGERY Bilateral    Cataract with Lens  . HERNIA REPAIR Right    Inguinal  . LUMBAR DISC SURGERY     per patient report  . VIDEO ASSISTED THORACOSCOPY (VATS)/ LOBECTOMY Right 12/30/2015   Procedure: VIDEO ASSISTED THORACOSCOPY (VATS)/RIGHT MIDDLE LOBECTOMY;  Surgeon: Melrose Nakayama, MD;  Location: [facility];  Service: Thoracic;  Laterality: Right;    Allergies: Morphine and related  Medications: Prior to Admission medications   Medication Sig Start Date End Date Taking? Authorizing Provider  aspirin EC 81 MG tablet Take 81 mg by mouth daily.   Yes  Historical Provider, MD  Cholecalciferol (VITAMIN D) 2000 UNITS CAPS Take 1 capsule by mouth daily.    Yes Historical Provider, MD  glipiZIDE (GLUCOTROL) 5 MG tablet Take 1 tablet (5 mg total) by mouth 2 (two) times daily before a meal. 01/05/16  Yes Wayne E Gold, PA-C  lisinopril (PRINIVIL,ZESTRIL) 40 MG tablet Take 1 tablet (40 mg total) by mouth daily. 01/05/16  Yes Wayne E Gold, PA-C  loratadine (CLARITIN) 10 MG tablet Take 10 mg by mouth daily.   Yes Historical Provider, MD  meclizine (ANTIVERT) 25 MG tablet Take 25 mg by mouth daily as needed for dizziness.   Yes Historical Provider, MD  pantoprazole (PROTONIX) 40 MG tablet Take 40 mg by mouth daily.    Yes Historical Provider, MD  psyllium (METAMUCIL) 58.6 % powder Take 1 packet by mouth daily.   Yes Historical Provider, MD  simvastatin (ZOCOR) 80 MG tablet Take 40 mg by mouth at bedtime.    Yes Historical Provider, MD  terazosin (HYTRIN) 5 MG capsule Take 5 mg by mouth at bedtime.   Yes Historical Provider, MD  CARBOPLATIN IV Inject into the vein. Every 3 weeks    Historical Provider, MD  Cyanocobalamin (VITAMIN B-12 IJ) Inject 1 Dose as directed every 30 (thirty) days.    Historical Provider, MD  dexamethasone (DECADRON) 4 MG tablet The day before, day of, and day after chemo take 2 tablets in  the am and 2 tablets in the pm with food. 02/01/16   Patrici Ranks, MD  folic acid (FOLVITE) 1 MG tablet Take 1 tablet daily and then take for 21 days past the completion of pemetrexed chemo. 02/01/16   Patrici Ranks, MD  lidocaine-prilocaine (EMLA) cream Apply a quarter size amount to port site 1 hour prior to chemo. Do not rub in. Cover with plastic wrap. 02/01/16   Patrici Ranks, MD  ondansetron (ZOFRAN) 8 MG tablet Take 1 tablet (8 mg total) by mouth every 8 (eight) hours as needed for nausea or vomiting. 02/01/16   Patrici Ranks, MD  oxyCODONE (OXY IR/ROXICODONE) 5 MG immediate release tablet Take 1-2 tablets (5-10 mg total) by mouth  every 6 (six) hours as needed for severe pain. 01/05/16   John Giovanni, PA-C  PEMEtrexed Disodium (ALIMTA IV) Inject into the vein. Every 3 weeks    Historical Provider, MD  polyethylene glycol (MIRALAX / GLYCOLAX) packet Take 17 g by mouth daily.    Historical Provider, MD  prochlorperazine (COMPAZINE) 10 MG tablet Take 1 tablet (10 mg total) by mouth every 6 (six) hours as needed for nausea or vomiting. 02/01/16   Patrici Ranks, MD     Family History  Problem Relation Age of Onset  . Colon cancer Brother     Social History   Social History  . Marital status: Married    Spouse name: N/A  . Number of children: N/A  . Years of education: N/A   Social History Main Topics  . Smoking status: Former Smoker    Types: Cigarettes    Quit date: 05/27/2004  . Smokeless tobacco: Never Used  . Alcohol use No  . Drug use: No  . Sexual activity: Not Asked   Other Topics Concern  . None   Social History Narrative  . None     Review of Systems currently denies fever, headache, abdominal pain, nausea, vomiting or abnormal bleeding. He does have occasional right-sided chest discomfort, some dyspnea with exertion and occasional cough and back pain.  Vital Signs: BP 139/65 (BP Location: Right Arm)   Pulse 65   Temp 98.1 F (36.7 C) (Oral)   Resp 18   SpO2 94%   Physical Exam awake, alert. Chest with slightly diminished breath sounds right base, left clear. Heart with regular rate and rhythm. Abdomen soft, positive bowel sounds, nontender. Lower extremities with 1-2+ edema bilaterally.  Mallampati Score:     Imaging: Dg Chest 2 View  Result Date: 01/20/2016 CLINICAL DATA:  Right middle lobectomy. EXAM: CHEST  2 VIEW COMPARISON:  01/03/2016. FINDINGS: Mediastinum hilar structures normal. Cardiomegaly with normal pulmonary vascularity. Postsurgical changes right lung with right base atelectasis and or scarring. No pneumothorax. No acute bony abnormality. IMPRESSION: 1. Postsurgical  changes right lung. Right base atelectasis and or scarring. No pneumothorax. 2.  No acute cardiopulmonary disease.  Stable cardiomegaly . Electronically Signed   By: Marcello Moores  Register   On: 01/20/2016 10:11    Labs:  CBC:  Recent Labs  12/20/15 1458 12/31/15 0400 01/01/16 0320 02/09/16 1300  WBC 10.9* 12.1* 10.9* 9.1  HGB 13.7 11.9* 11.1* 13.1  HCT 41.3 36.8* 35.0* 38.5*  PLT 266 205 206 256    COAGS:  Recent Labs  12/20/15 1458 02/09/16 1300  INR 1.06 1.00  APTT 31  --     BMP:  Recent Labs  12/07/15 1052 12/20/15 1458 12/31/15 0400 01/01/16 0320  NA 138 139  137 138  K 3.7 3.5 4.1 4.1  CL 109 110 106 106  CO2 25 21* 26 28  GLUCOSE 153* 79 169* 171*  BUN 15 11 <5* 5*  CALCIUM 8.4* 9.1 8.0* 7.9*  CREATININE 0.95 0.88 0.78 0.77  GFRNONAA >60 >60 >60 >60  GFRAA >60 >60 >60 >60    LIVER FUNCTION TESTS:  Recent Labs  07/28/15 1555 12/07/15 1052 12/20/15 1458 01/01/16 0320  BILITOT 0.7 0.7 0.6 0.8  AST 38 '22 24 21  '$ ALT '26 18 20 '$ 16*  ALKPHOS 66 64 72 51  PROT 6.5 6.5 6.8 5.1*  ALBUMIN 3.4* 3.5 3.6 2.7*    TUMOR MARKERS: No results for input(s): AFPTM, CEA, CA199, CHROMGRNA in the last 8760 hours.  Assessment and Plan: 82 y.o. 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 He presents today for Port-A-Cath placement for planned chemotherapy.Risks and benefits discussed with the patient/family including, but not limited to bleeding, infection, pneumothorax, or fibrin sheath development and need for additional procedures.All of the patient's questions were answered, patient is agreeable to proceed.Consent signed and in chart.       Thank you for this interesting consult.  I greatly enjoyed meeting [name] and look forward to participating in their care.  A copy of this report was sent to the requesting provider on  this date.  Electronically Signed: D. Rowe Robert 02/09/2016, 2:08 PM   I spent a total of 25 minutes in face to face in clinical consultation, greater than 50% of which was counseling/coordinating care for port a cath placement

## 2016-02-10 ENCOUNTER — Other Ambulatory Visit (HOSPITAL_COMMUNITY): Payer: Self-pay | Admitting: Emergency Medicine

## 2016-02-10 ENCOUNTER — Encounter (HOSPITAL_COMMUNITY): Payer: Self-pay

## 2016-02-10 ENCOUNTER — Encounter (HOSPITAL_COMMUNITY): Payer: Medicare Other | Attending: Hematology & Oncology

## 2016-02-10 VITALS — BP 111/43 | HR 68 | Temp 98.4°F | Resp 18

## 2016-02-10 DIAGNOSIS — R918 Other nonspecific abnormal finding of lung field: Secondary | ICD-10-CM | POA: Insufficient documentation

## 2016-02-10 DIAGNOSIS — E538 Deficiency of other specified B group vitamins: Secondary | ICD-10-CM | POA: Diagnosis present

## 2016-02-10 DIAGNOSIS — D649 Anemia, unspecified: Secondary | ICD-10-CM | POA: Insufficient documentation

## 2016-02-10 MED ORDER — CYANOCOBALAMIN 1000 MCG/ML IJ SOLN
1000.0000 ug | Freq: Once | INTRAMUSCULAR | Status: AC
Start: 2016-02-10 — End: 2016-02-10
  Administered 2016-02-10: 1000 ug via INTRAMUSCULAR
  Filled 2016-02-10: qty 1

## 2016-02-10 NOTE — Patient Instructions (Signed)
[facility] at [facility] Discharge Instructions  RECOMMENDATIONS MADE BY THE CONSULTANT AND ANY TEST RESULTS WILL BE SENT TO YOUR REFERRING PHYSICIAN.  B12 injection today. Return as scheduled for injections. Return as scheduled for lab work and office visit.  Thank you for choosing [facility] at [facility] to provide your oncology and hematology care.  To afford each patient quality time with our provider, please arrive at least 15 minutes before your scheduled appointment time.   Beginning January 23rd 2017 lab work for the Cancer Center will be done in the  Main lab at [facility] on 1st floor. If you have a lab appointment with the Cancer Center please come in thru the  Main Entrance and check in at the main information desk  You need to re-schedule your appointment should you arrive 10 or more minutes late.  We strive to give you quality time with our providers, and arriving late affects you and other patients whose appointments are after yours.  Also, if you no show three or more times for appointments you may be dismissed from the clinic at the providers discretion.     Again, thank you for choosing [facility].  Our hope is that these requests will decrease the amount of time that you wait before being seen by our physicians.       _____________________________________________________________  Should you have questions after your visit to [facility], please contact our office at [phone] between the hours of 8:30 a.m. and 4:30 p.m.  Voicemails left after 4:30 p.m. will not be returned until the following business day.  For prescription refill requests, have your pharmacy contact our office.         Resources For Cancer Patients and their Caregivers ? American Cancer Society: Can assist with transportation, wigs, general needs, runs Look Good Feel Better.        1-888-227-6333 ? Cancer  Care: Provides financial assistance, online support groups, medication/co-pay assistance.  1-800-813-HOPE (4673) ? [facility] Assists [place] cancer patients and their families through emotional , educational and financial support.  336-427-4357 ? [place] DSS Where to apply for food stamps, Medicaid and utility assistance. 336-342-1394 ? RCATS: Transportation to medical appointments. 336-347-2287 ? Social Security Administration: May apply for disability if have a Stage IV cancer. 336-342-7796 1-800-772-1213 ? [place] Aging, Disability and Transit Services: Assists with nutrition, care and transit needs. 336-349-2343  Cancer Center Support Programs: @[email or web address]@ > Cancer Support Group  2nd Tuesday of the month 1pm-2pm, Journey Room  > Creative Journey  3rd Tuesday of the month 1130am-1pm, Journey Room  > Look Good Feel Better  1st Wednesday of the month 10am-12 noon, Journey Room (Call American Cancer Society to register 1-800-395-5775)   

## 2016-02-10 NOTE — Progress Notes (Signed)
[name] presents today for injection per the provider's orders.  B12 administration without incident; see MAR for injection details.  Patient tolerated procedure well and without incident.  No questions or complaints noted at this time.

## 2016-02-15 ENCOUNTER — Encounter (HOSPITAL_COMMUNITY): Payer: Medicare Other

## 2016-02-15 DIAGNOSIS — C3491 Malignant neoplasm of unspecified part of right bronchus or lung: Secondary | ICD-10-CM

## 2016-02-16 ENCOUNTER — Encounter (HOSPITAL_COMMUNITY): Payer: Medicare Other | Attending: Hematology & Oncology

## 2016-02-16 VITALS — BP 134/47 | HR 72 | Temp 98.1°F | Resp 18 | Wt 202.2 lb

## 2016-02-16 DIAGNOSIS — C342 Malignant neoplasm of middle lobe, bronchus or lung: Secondary | ICD-10-CM | POA: Diagnosis not present

## 2016-02-16 DIAGNOSIS — C3491 Malignant neoplasm of unspecified part of right bronchus or lung: Secondary | ICD-10-CM | POA: Insufficient documentation

## 2016-02-16 DIAGNOSIS — Z5111 Encounter for antineoplastic chemotherapy: Secondary | ICD-10-CM

## 2016-02-16 LAB — COMPREHENSIVE METABOLIC PANEL
ALT: 16 U/L — ABNORMAL LOW (ref 17–63)
AST: 22 U/L (ref 15–41)
Albumin: 3.4 g/dL — ABNORMAL LOW (ref 3.5–5.0)
Alkaline Phosphatase: 63 U/L (ref 38–126)
Anion gap: 10 (ref 5–15)
BUN: 16 mg/dL (ref 6–20)
CO2: 23 mmol/L (ref 22–32)
Calcium: 8.9 mg/dL (ref 8.9–10.3)
Chloride: 102 mmol/L (ref 101–111)
Creatinine, Ser: 0.89 mg/dL (ref 0.61–1.24)
GFR calc Af Amer: >60 mL/min (ref >60)
GFR calc non Af Amer: >60 mL/min (ref >60)
Glucose, Bld: 290 mg/dL — ABNORMAL HIGH (ref 65–99)
Potassium: 4 mmol/L (ref 3.5–5.1)
Sodium: 135 mmol/L (ref 135–145)
Total Bilirubin: 0.4 mg/dL (ref 0.3–1.2)
Total Protein: 6.5 g/dL (ref 6.5–8.1)

## 2016-02-16 LAB — CBC WITH DIFFERENTIAL/PLATELET
Basophils Absolute: 0 10*3/uL (ref 0.0–0.1)
Basophils Relative: 0 %
Eosinophils Absolute: 0 10*3/uL (ref 0.0–0.7)
Eosinophils Relative: 0 %
HCT: 35.9 % — ABNORMAL LOW (ref 39.0–52.0)
Hemoglobin: 12 g/dL — ABNORMAL LOW (ref 13.0–17.0)
Lymphocytes Relative: 9 %
Lymphs Abs: 1.7 10*3/uL (ref 0.7–4.0)
MCH: 29.6 pg (ref 26.0–34.0)
MCHC: 33.4 g/dL (ref 30.0–36.0)
MCV: 88.4 fL (ref 78.0–100.0)
Monocytes Absolute: 0.6 10*3/uL (ref 0.1–1.0)
Monocytes Relative: 3 %
Neutro Abs: 15.9 10*3/uL — ABNORMAL HIGH (ref 1.7–7.7)
Neutrophils Relative %: 88 %
Platelets: 274 10*3/uL (ref 150–400)
RBC: 4.06 MIL/uL — ABNORMAL LOW (ref 4.22–5.81)
RDW: 12.9 % (ref 11.5–15.5)
WBC: 18.2 10*3/uL — ABNORMAL HIGH (ref 4.0–10.5)

## 2016-02-16 MED ORDER — HYDROCODONE-ACETAMINOPHEN 5-325 MG PO TABS: 1.0000 | ORAL_TABLET | ORAL | 0 refills | Status: DC | PRN

## 2016-02-16 MED ORDER — PROCHLORPERAZINE MALEATE 10 MG PO TABS: 10.0000 mg | ORAL_TABLET | Freq: Four times a day (QID) | ORAL | 2 refills | Status: DC | PRN

## 2016-02-16 MED ORDER — SODIUM CHLORIDE 0.9 % IV SOLN
484.0000 mg | Freq: Once | INTRAVENOUS | Status: AC
  Administered 2016-02-16: 480 mg via INTRAVENOUS
  Filled 2016-02-16: qty 48

## 2016-02-16 MED ORDER — SODIUM CHLORIDE 0.9 % IV SOLN
10.0000 mg | Freq: Once | INTRAVENOUS | Status: AC
  Administered 2016-02-16: 10 mg via INTRAVENOUS
  Filled 2016-02-16: qty 1

## 2016-02-16 MED ORDER — HEPARIN SOD (PORK) LOCK FLUSH 100 UNIT/ML IV SOLN
500.0000 [IU] | Freq: Once | INTRAVENOUS | Status: AC | PRN
  Administered 2016-02-16: 500 [IU]
  Filled 2016-02-16: qty 5

## 2016-02-16 MED ORDER — PALONOSETRON HCL INJECTION 0.25 MG/5ML
0.2500 mg | Freq: Once | INTRAVENOUS | Status: AC
  Administered 2016-02-16: 0.25 mg via INTRAVENOUS
  Filled 2016-02-16: qty 5

## 2016-02-16 MED ORDER — SODIUM CHLORIDE 0.9 % IV SOLN
500.0000 mg/m2 | Freq: Once | INTRAVENOUS | Status: AC
  Administered 2016-02-16: 1050 mg via INTRAVENOUS
  Filled 2016-02-16: qty 20

## 2016-02-16 MED ORDER — SODIUM CHLORIDE 0.9% FLUSH: 10.0000 mL | INTRAVENOUS | Status: DC | PRN

## 2016-02-16 MED ORDER — SODIUM CHLORIDE 0.9 % IV SOLN
Freq: Once | INTRAVENOUS | Status: AC
  Administered 2016-02-16: 12:00:00 via INTRAVENOUS

## 2016-02-16 NOTE — Patient Instructions (Signed)
[facility] Discharge Instructions for Patients Receiving Chemotherapy   Beginning January 23rd 2017 lab work for the [facility] will be done in the  Main lab at [facility] on 1st floor. If you have a lab appointment with the [facility] please come in thru the  Main Entrance and check in at the main information desk   Today you received the following chemotherapy agents: pemetrexed and carboplatin.   Please call us if you develop any symptoms that you can't control with you medications at home.  We will call you tomorrow to check on you.     If you develop nausea and vomiting, or diarrhea that is not controlled by your medication, call the clinic.  The clinic phone number is [phone]. Office hours are Monday-Friday 8:30am-5:00pm.  BELOW ARE SYMPTOMS THAT SHOULD BE REPORTED IMMEDIATELY:  *FEVER GREATER THAN 101.0 F  *CHILLS WITH OR WITHOUT FEVER  NAUSEA AND VOMITING THAT IS NOT CONTROLLED WITH YOUR NAUSEA MEDICATION  *UNUSUAL SHORTNESS OF BREATH  *UNUSUAL BRUISING OR BLEEDING  TENDERNESS IN MOUTH AND THROAT WITH OR WITHOUT PRESENCE OF ULCERS  *URINARY PROBLEMS  *BOWEL PROBLEMS  UNUSUAL RASH Items with * indicate a potential emergency and should be followed up as soon as possible. If you have an emergency after office hours please contact your primary care physician or go to the nearest emergency department.  Please call the clinic during office hours if you have any questions or concerns.   You may also contact the Patient Navigator at [phone] should you have any questions or need assistance in obtaining follow up care.      Resources For Cancer Patients and their Caregivers ? American Cancer Society: Can assist with transportation, wigs, general needs, runs Look Good Feel Better.        8306255521 ? Cancer Care: Provides financial assistance, online support groups, medication/co-pay assistance.  1-800-813-HOPE 5023796233) ? [facility] Assists [place] cancer patients and their families through emotional , educational and financial support.  (918)020-1398 ? [place] DSS Where to apply for food stamps, Medicaid and utility assistance. 585-784-6897 ? RCATS: Transportation to medical appointments. (250) 831-3412 ? Social Security Administration: May apply for disability if have a Stage IV cancer. (930)205-2573 254-252-7943 ? [employer], Disability and Transit Services: Assists with nutrition, care and transit needs. 385-242-0574

## 2016-02-16 NOTE — Progress Notes (Signed)
Patient tolerated infusion well.  VSS.  Labs discussed with MD before treatment as well as patient's physical assessment information, ok to treat.  Patient ambulatory and stable upon discharge from clinic.

## 2016-02-17 ENCOUNTER — Telehealth (HOSPITAL_COMMUNITY): Payer: Self-pay | Admitting: Emergency Medicine

## 2016-02-17 NOTE — Telephone Encounter (Signed)
24 hour call back-pts wife states that he is doing well, good appetite, no nausea, he blood sugar did increase a little.  She will call with any problems

## 2016-02-27 ENCOUNTER — Encounter (HOSPITAL_COMMUNITY): Payer: Medicare Other

## 2016-02-27 DIAGNOSIS — R918 Other nonspecific abnormal finding of lung field: Secondary | ICD-10-CM | POA: Diagnosis not present

## 2016-02-27 DIAGNOSIS — C3491 Malignant neoplasm of unspecified part of right bronchus or lung: Secondary | ICD-10-CM

## 2016-02-27 DIAGNOSIS — E538 Deficiency of other specified B group vitamins: Secondary | ICD-10-CM | POA: Diagnosis present

## 2016-02-27 DIAGNOSIS — D649 Anemia, unspecified: Secondary | ICD-10-CM | POA: Diagnosis present

## 2016-02-27 LAB — CBC WITH DIFFERENTIAL/PLATELET
Basophils Absolute: 0 10*3/uL (ref 0.0–0.1)
Basophils Relative: 0 %
Eosinophils Absolute: 0.1 10*3/uL (ref 0.0–0.7)
Eosinophils Relative: 1 %
HCT: 33.7 % — ABNORMAL LOW (ref 39.0–52.0)
Hemoglobin: 11.3 g/dL — ABNORMAL LOW (ref 13.0–17.0)
Lymphocytes Relative: 44 %
Lymphs Abs: 1.7 10*3/uL (ref 0.7–4.0)
MCH: 29.7 pg (ref 26.0–34.0)
MCHC: 33.5 g/dL (ref 30.0–36.0)
MCV: 88.5 fL (ref 78.0–100.0)
Monocytes Absolute: 0.7 10*3/uL (ref 0.1–1.0)
Monocytes Relative: 17 %
Neutro Abs: 1.5 10*3/uL — ABNORMAL LOW (ref 1.7–7.7)
Neutrophils Relative %: 38 %
Platelets: 190 10*3/uL (ref 150–400)
RBC: 3.81 MIL/uL — ABNORMAL LOW (ref 4.22–5.81)
RDW: 12.5 % (ref 11.5–15.5)
WBC: 3.8 10*3/uL — ABNORMAL LOW (ref 4.0–10.5)

## 2016-02-27 LAB — COMPREHENSIVE METABOLIC PANEL
ALT: 30 U/L (ref 17–63)
AST: 22 U/L (ref 15–41)
Albumin: 3.5 g/dL (ref 3.5–5.0)
Alkaline Phosphatase: 73 U/L (ref 38–126)
Anion gap: 5 (ref 5–15)
BUN: 7 mg/dL (ref 6–20)
CO2: 29 mmol/L (ref 22–32)
Calcium: 8.7 mg/dL — ABNORMAL LOW (ref 8.9–10.3)
Chloride: 102 mmol/L (ref 101–111)
Creatinine, Ser: 0.93 mg/dL (ref 0.61–1.24)
GFR calc Af Amer: >60 mL/min (ref >60)
GFR calc non Af Amer: >60 mL/min (ref >60)
Glucose, Bld: 144 mg/dL — ABNORMAL HIGH (ref 65–99)
Potassium: 4.6 mmol/L (ref 3.5–5.1)
Sodium: 136 mmol/L (ref 135–145)
Total Bilirubin: 0.5 mg/dL (ref 0.3–1.2)
Total Protein: 6.3 g/dL — ABNORMAL LOW (ref 6.5–8.1)

## 2016-02-28 ENCOUNTER — Encounter (HOSPITAL_BASED_OUTPATIENT_CLINIC_OR_DEPARTMENT_OTHER): Payer: Medicare Other | Admitting: Hematology & Oncology

## 2016-02-28 ENCOUNTER — Encounter (HOSPITAL_COMMUNITY): Payer: Self-pay | Admitting: Hematology & Oncology

## 2016-02-28 VITALS — BP 131/45 | HR 82 | Temp 97.9°F | Resp 16 | Wt 196.8 lb

## 2016-02-28 DIAGNOSIS — Z0000 Encounter for general adult medical examination without abnormal findings: Secondary | ICD-10-CM

## 2016-02-28 DIAGNOSIS — R059 Cough, unspecified: Secondary | ICD-10-CM

## 2016-02-28 DIAGNOSIS — E538 Deficiency of other specified B group vitamins: Secondary | ICD-10-CM

## 2016-02-28 DIAGNOSIS — Z902 Acquired absence of lung [part of]: Secondary | ICD-10-CM

## 2016-02-28 DIAGNOSIS — C342 Malignant neoplasm of middle lobe, bronchus or lung: Secondary | ICD-10-CM

## 2016-02-28 DIAGNOSIS — C3491 Malignant neoplasm of unspecified part of right bronchus or lung: Secondary | ICD-10-CM

## 2016-02-28 DIAGNOSIS — R05 Cough: Secondary | ICD-10-CM

## 2016-02-28 DIAGNOSIS — Z23 Encounter for immunization: Secondary | ICD-10-CM

## 2016-02-28 MED ORDER — INFLUENZA VAC SPLIT QUAD 0.5 ML IM SUSY
0.5000 mL | PREFILLED_SYRINGE | Freq: Once | INTRAMUSCULAR | Status: AC
  Administered 2016-02-28: 0.5 mL via INTRAMUSCULAR
  Filled 2016-02-28: qty 0.5

## 2016-02-28 MED ORDER — HYDROCODONE-ACETAMINOPHEN 5-325 MG PO TABS: 1.0000 | ORAL_TABLET | ORAL | 0 refills | Status: DC | PRN

## 2016-02-28 NOTE — Progress Notes (Signed)
[facility] Hematology/Oncology Consultation   Name: [name]      MRN: [id number]    Date: 02/29/2016 Time:2:39 PM   REFERRING PHYSICIAN:  Deberah Castle, NP (GI)  REASON FOR CONSULT:  "Lung Cancer"   DIAGNOSIS:      Adenocarcinoma of right lung, stage 3 ([place])   11/10/2015 Imaging    13 mm spiculated lesion seen in right middle lobe.      11/30/2015 PET scan    Spiculated right middle lobe nodule is mildly hypermetabolic and most consistent with a stage IA adenocarcinoma.      12/30/2015 Surgery    Right middle lobectomy and node dissection      12/30/2015 Procedure    Right video-assisted thoracoscopy, Thoracoscopic right middle lobectomy, Mediastinal lymph node dissection, and On-Q local anesthetic catheter placement.      01/02/2016 Pathology Results    1. Lung, resection (segmental or lobe), Right Middle Lobe - INVASIVE ADENOCARCINOMA, WELL DIFFERENTIATED, SPANNING 1.2 CM. - THE SURGICAL RESECTION MARGINS ARE NEGATIVE FOR CARCINOMA. 2. Lymph node, biopsy, Level 12 - METASTATIC CARCINOMA IN 1 OF 1 LYMPH NODE (1/1). 3. Lymph node, biopsy, Level 12 #2 - METASTATIC CARCINOMA IN 1 OF 1 LYMPH NODE (1/1). 4. Lymph node, biopsy, Level 7 - METASTATIC CARCINOMA IN 1 OF 1 LYMPH NODE (1/1/). 5. Lymph node, biopsy, Level 7 #2 - METASTATIC CARCINOMA IN 1 OF 1 LYMPH NODE (1/1). 6. Lymph node, biopsy, Level 7 #3 - METASTATIC CARCINOMA IN 1 OF 1 LYMPH NODE (1/1). 7. Lymph node, biopsy, Level 7 #4 - METASTATIC CARCINOMA IN 1 OF 1 LYMPH NODE (1/1). 8. Lymph node, biopsy, 4R - THERE IS NO EVIDENCE OF CARCINOMA IN 1 OF 1 LYMPH NODE (0/1). 9. Lymph node, biopsy, 4R #2 - THERE IS NO EVIDENCE OF CARCINOMA IN 1 OF 1 LYMPH NODE (0/1).      01/05/2016 Pathology Results    PDL1 NEGATIVE- tumor proportion score of 0%.       01/05/2016 Pathology Results    Genomic alterations identified: BRAF V600E, KIT amplification, PDGFRA amplification, CDKN2A/B loss, TP53  S75f*33.  Additional findings: MSI-STABLE.  No reportable alterations identified: EGFR, KRAS, ALK, MET, RET, ERBB2, ROS1      02/09/2016 Procedure    Port placed by IR.      02/16/2016 -  Chemotherapy    The patient had palonosetron (ALOXI) injection 0.25 mg, 0.25 mg, Intravenous,  Once, 0 of 4 cycles  PEMEtrexed (ALIMTA) 1,050 mg in sodium chloride 0.9 % 100 mL chemo infusion, 500 mg/m2, Intravenous,  Once, 0 of 4 cycles  CARBOplatin (PARAPLATIN) in sodium chloride 0.9 % 100 mL chemo infusion, , Intravenous,  Once, 0 of 4 cycles  palonosetron (ALOXI) injection 0.25 mg, 0.25 mg, Intravenous,  Once, 1 of 6 cycles  PEMEtrexed (ALIMTA) 1,050 mg in sodium chloride 0.9 % 100 mL chemo infusion, 500 mg/m2 = 1,050 mg, Intravenous,  Once, 1 of 6 cycles  CARBOplatin (PARAPLATIN) 480 mg in sodium chloride 0.9 % 250 mL chemo infusion, 480 mg (100 % of original dose 484 mg), Intravenous,  Once, 1 of 6 cycles Dose modification:   (original dose 484 mg, Cycle 1)  for chemotherapy treatment.         HISTORY OF PRESENT ILLNESS:   Gary LANGANis a 82y.o. male who presents today for further follow-up of a stage III adenocarcinoma of the lung. He has been treated with his first cycle of  carboplatin/alimta.   Patient is accompanied by his wife. He states that he is feeling well and denies nausea, vomiting, or fatigue. He has not been drinking Ensure because his appetite has been so well.  Patient states that the vitamin B12 injection has alleviated his neuropathy significantly.  [name] is still dependent on his walker, but likely needed it prior to surgery, because his wife states that he had fallen multiple times in the past prior. He notes he gets around well.   He reports that sometimes he awakes during the night with a cough and, as a result, experiences difficulty sleeping. He also reports difficulty laying on his right side due to some discomfort at the port site.   Cycle 2 of  Alimta/Carboplatin is 03/08/2016. He absolutely denies any difficulties from therapy. Fatigue is no worse.    Review of Systems  Constitutional: Negative for chills, fever, malaise/fatigue and weight loss.       Uses a walker  HENT: Negative for congestion, sore throat and tinnitus.   Eyes: Negative for blurred vision and double vision.  Respiratory: Positive for cough. Negative for hemoptysis and shortness of breath.        Cough during the night. Leads to difficulty sleeping.  Cardiovascular: Positive for leg swelling. Negative for chest pain, palpitations and orthopnea.       Edema since surgery  Gastrointestinal: Negative for abdominal pain, blood in stool, constipation, diarrhea, melena, nausea and vomiting.  Genitourinary: Negative for dysuria and urgency.  Musculoskeletal: Negative.   Skin: Negative for itching and rash.  Neurological: Negative.  Negative for dizziness, sensory change, speech change, focal weakness, seizures, loss of consciousness, weakness and headaches.  Endo/Heme/Allergies: Negative.   Psychiatric/Behavioral: Negative.   14 point review of systems was performed and is negative except as detailed under history of present illness and above    PAST MEDICAL HISTORY:   Past Medical History:  Diagnosis Date  . Adenocarcinoma of lung, stage 3 (HCC)    Stage IIIA  . Adenocarcinoma of right lung, stage 3 ([place]) 12/02/2015  . Arthritis   . Atrial fibrillation, transient ([place])    transient postop, < 24 hours  . B12 deficiency   . Cyst of scrotum   . Diverticulitis   . GERD (gastroesophageal reflux disease)   . Hernia   . History of pneumonia   . Hyperlipidemia   . Lung nodule    Spiculated right middle lobe nodule, hypermetabolic  . Type 2 diabetes mellitus (HCC)     ALLERGIES: Allergies  Allergen Reactions  . Morphine And Related Other (See Comments)    confusion      MEDICATIONS: I have reviewed the patient's current medications.    Current  Outpatient Prescriptions on File Prior to Visit  Medication Sig Dispense Refill  . aspirin EC 81 MG tablet Take 81 mg by mouth daily.    Marland Kitchen CARBOPLATIN IV Inject into the vein. Every 3 weeks    . Cholecalciferol (VITAMIN D) 2000 UNITS CAPS Take 1 capsule by mouth daily.     . Cyanocobalamin (VITAMIN B-12 IJ) Inject 1 Dose as directed every 30 (thirty) days.    Marland Kitchen dexamethasone (DECADRON) 4 MG tablet The day before, day of, and day after chemo take 2 tablets in the am and 2 tablets in the pm with food. 36 tablet 1  . folic acid (FOLVITE) 1 MG tablet Take 1 tablet daily and then take for 21 days past the completion of pemetrexed chemo. 30 tablet  6  . glipiZIDE (GLUCOTROL) 5 MG tablet Take 1 tablet (5 mg total) by mouth 2 (two) times daily before a meal. 60 tablet 1  . lidocaine-prilocaine (EMLA) cream Apply a quarter size amount to port site 1 hour prior to chemo. Do not rub in. Cover with plastic wrap. 30 g 3  . lisinopril (PRINIVIL,ZESTRIL) 40 MG tablet Take 1 tablet (40 mg total) by mouth daily. 30 tablet 1  . loratadine (CLARITIN) 10 MG tablet Take 10 mg by mouth daily.    . meclizine (ANTIVERT) 25 MG tablet Take 25 mg by mouth daily as needed for dizziness.    . ondansetron (ZOFRAN) 8 MG tablet Take 1 tablet (8 mg total) by mouth every 8 (eight) hours as needed for nausea or vomiting. 30 tablet 2  . oxyCODONE (OXY IR/ROXICODONE) 5 MG immediate release tablet Take 1-2 tablets (5-10 mg total) by mouth every 6 (six) hours as needed for severe pain. 30 tablet 0  . pantoprazole (PROTONIX) 40 MG tablet Take 40 mg by mouth daily.     Marland Kitchen PEMEtrexed Disodium (ALIMTA IV) Inject into the vein. Every 3 weeks    . polyethylene glycol (MIRALAX / GLYCOLAX) packet Take 17 g by mouth daily.    . prochlorperazine (COMPAZINE) 10 MG tablet Take 1 tablet (10 mg total) by mouth every 6 (six) hours as needed for nausea or vomiting. 30 tablet 2  . psyllium (METAMUCIL) 58.6 % powder Take 1 packet by mouth daily.    .  simvastatin (ZOCOR) 80 MG tablet Take 40 mg by mouth at bedtime.     Marland Kitchen terazosin (HYTRIN) 5 MG capsule Take 5 mg by mouth at bedtime.     No current facility-administered medications on file prior to visit.      PAST SURGICAL HISTORY Past Surgical History:  Procedure Laterality Date  . BACK SURGERY    . CHOLECYSTECTOMY    . COLONOSCOPY N/A 11/24/2015   Procedure: COLONOSCOPY;  Surgeon: Rogene Houston, MD;  Location[facility];  Service: Endoscopy;  Laterality: N/A;  730  . COLONOSCOPY W/ POLYPECTOMY     x 5  . CYST EXCISION     Scrotum  . EYE SURGERY Bilateral    Cataract with Lens  . HERNIA REPAIR Right    Inguinal  . IR GENERIC HISTORICAL  02/09/2016   IR US GUIDE VASC ACCESS RIGHT 02/09/2016 Arne Cleveland, MD [facility]  . IR GENERIC HISTORICAL  02/09/2016   IR FLUORO GUIDE CV LINE RIGHT 02/09/2016 Arne Cleveland, MD [facility]  . LUMBAR DISC SURGERY     per patient report  . VIDEO ASSISTED THORACOSCOPY (VATS)/ LOBECTOMY Right 12/30/2015   Procedure: VIDEO ASSISTED THORACOSCOPY (VATS)/RIGHT MIDDLE LOBECTOMY;  Surgeon: Melrose Nakayama, MD;  Location: [place];  Service: Thoracic;  Laterality: Right;    FAMILY HISTORY: Family History  Problem Relation Age of Onset  . Colon cancer Brother   Mother died at the age of 51 secondary to complications associated with childbirth. [name] passed in his 67's from a possible stroke versus blood clot. He has 1 brother who is 31 years old He has 1 sister who is 9 years old He has another borther who is 80 years old.   His youngest brother is 26 years old.  1 brother is deceased at the age of 22 secondary to colon cancer.  SOCIAL HISTORY: He is an ex-smoker, quitting about 15 years ago after smoking 0.5ppd x 50 years.  He does not  drink EtOH, but he admits that he used to drink EtOH.  He denies any illicit drug abuse.  He denies any religious affiliation.  He is a retired Magazine features editor.  Social History   Social  History  . Marital status: Married    Spouse name: N/A  . Number of children: N/A  . Years of education: N/A   Social History Main Topics  . Smoking status: Former Smoker    Types: Cigarettes    Quit date: 05/27/2004  . Smokeless tobacco: Never Used  . Alcohol use No  . Drug use: No  . Sexual activity: Not Asked   Other Topics Concern  . None   Social History Narrative  . None    PERFORMANCE STATUS: The patient's performance status is 1 - Symptomatic but completely ambulatory  PHYSICAL EXAM: Most Recent Vital Signs: Blood pressure (!) 131/45, pulse 82, temperature 97.9 F (36.6 C), temperature source Oral, resp. rate 16, weight 196 lb 12.8 oz (89.3 kg), SpO2 96 %. BP (!) 131/45 (BP Location: Left Arm, Patient Position: Sitting)   Pulse 82   Temp 97.9 F (36.6 C) (Oral)   Resp 16   Wt 196 lb 12.8 oz (89.3 kg)   SpO2 96%   BMI 29.06 kg/m   General Appearance:    Alert, cooperative, no distress, appears stated age, accompanied by his wife.  Head:    Normocephalic, without obvious abnormality, atraumatic  Eyes:    Conjunctiva/corneas clear, EOM's intact       Ears:    External anatomy is normal.  Nose:   Nares normal, mucosa normal, no drainage or sinus tenderness  Throat:   Lips, mucosa, and tongue normal  Neck:   Supple, symmetrical, trachea midline, no adenopathy;       thyroid:  No enlargement/tenderness/nodules  Back:     Symmetric, no curvature, ROM normal, no CVA tenderness  Lungs:     Clear to auscultation bilaterally, respirations unlabored  Chest wall:    No tenderness or deformity Several incision sites all well healed. Steri strips in place. No edema noted.  Heart:    Regular rate and rhythm, S1 and S2 normal, no murmur, rub   or gallop  Abdomen:     Soft, non-tender, bowel sounds active all four quadrants,    no masses, no organomegaly        Extremities:   Extremities normal, atraumatic, no cyanosis or edema  Pulses:   2+ and symmetric all extremities   Skin:   Skin color, texture, turgor normal, no rashes or lesions  Lymph nodes:   Cervical, supraclavicular, and axillary nodes normal  Neurologic:   CNII-XII intact. Normal strength, sensation and reflexes      throughout    LABORATORY DATA:  I have reviewed the data as listed. CBC    Component Value Date/Time   WBC 3.8 (L) 02/27/2016 1121   RBC 3.81 (L) 02/27/2016 1121   HGB 11.3 (L) 02/27/2016 1121   HCT 33.7 (L) 02/27/2016 1121   PLT 190 02/27/2016 1121   MCV 88.5 02/27/2016 1121   MCH 29.7 02/27/2016 1121   MCHC 33.5 02/27/2016 1121   RDW 12.5 02/27/2016 1121   LYMPHSABS 1.7 02/27/2016 1121   MONOABS 0.7 02/27/2016 1121   EOSABS 0.1 02/27/2016 1121   BASOSABS 0.0 02/27/2016 1121      Chemistry      Component Value Date/Time   NA 136 02/27/2016 1121   K 4.6 02/27/2016 1121   CL  102 02/27/2016 1121   CO2 29 02/27/2016 1121   BUN 7 02/27/2016 1121   CREATININE 0.93 02/27/2016 1121   CREATININE 0.95 11/03/2015 1452      Component Value Date/Time   CALCIUM 8.7 (L) 02/27/2016 1121   ALKPHOS 73 02/27/2016 1121   AST 22 02/27/2016 1121   ALT 30 02/27/2016 1121   BILITOT 0.5 02/27/2016 1121     Lab Results  Component Value Date   IRON 71 12/07/2015   TIBC 302 12/07/2015   FERRITIN 40 12/07/2015   Lab Results  Component Value Date   VITAMINB12 170 (L) 12/07/2015   Lab Results  Component Value Date   FOLATE 39.9 12/07/2015     RADIOGRAPHY: I have personally reviewed the radiological images as listed and agreed with the findings in the report.  Study Result   CLINICAL DATA:  Right middle lobectomy.  EXAM: CHEST  2 VIEW  COMPARISON:  01/03/2016.  FINDINGS: Mediastinum hilar structures normal. Cardiomegaly with normal pulmonary vascularity. Postsurgical changes right lung with right base atelectasis and or scarring. No pneumothorax. No acute bony abnormality.  IMPRESSION: 1. Postsurgical changes right lung. Right base atelectasis and  or scarring. No pneumothorax.  2.  No acute cardiopulmonary disease.  Stable cardiomegaly .   Electronically Signed   By: Marcello Moores  Register   On: 01/20/2016 10:11     PATHOLOGY:     Diagnosis Colon, polyp(s), ascending - TUBULAR ADENOMA(S). - HIGH GRADE DYSPLASIA IS NOT IDENTIFIED. Casimer Lanius MD Pathologist, Electronic Signature (Case signed 11/25/2015)   ASSESSMENT/PLAN:  RML nodule Stage IIIA NSCLC, T1a,N2,M0 Post op Afib RM lobectomy, mediastinal LN dissection  He is currently on Carboplatin/Alimta and has done well. No indication for XRT given no bulky N2 disease.   He is eating well. Weight is overall stable. I have encouraged him to continue with boost/ensure once daily.  B12 deficiency B12 deficiency requiring IM B12 replacement. Continue ongoing replacement.  He has noticed a significant improvement in his neuropathy.   Antibody testing for pernicious anemia negative.  Cough  I have encouraged him to try his hydrocodone for his cough. He can start with 1/2 tablet.   Follow-up with patient on 03/08/2016, prior to cycle #2 of therapy.   All questions were answered. The patient knows to call the clinic with any problems, questions or concerns. We can certainly see the patient much sooner if necessary.  This document serves as a record of services personally performed by Ancil Linsey, MD. It was created on her behalf by Elmyra Ricks, a trained medical scribe. The creation of this record is based on the scribe's personal observations and the provider's statements to them. This document has been checked and approved by the attending provider.  I have reviewed the above documentation for accuracy and completeness, and I agree with the above.  This note is electronically signed [email or web address] Cyril Mourning, MD  02/29/2016 2:39 PM

## 2016-02-28 NOTE — Patient Instructions (Addendum)
[place] at [facility] Discharge Instructions  RECOMMENDATIONS MADE BY THE CONSULTANT AND ANY TEST RESULTS WILL BE SENT TO YOUR REFERRING PHYSICIAN.  You saw Dr. Whitney Muse today. Flu vaccine today. Follow up with MD on 9/28.  Thank you for choosing [place] at [facility] to provide your oncology and hematology care.  To afford each patient quality time with our provider, please arrive at least 15 minutes before your scheduled appointment time.   Beginning January 23rd 2017 lab work for the [facility] will be done in the  Main lab at [facility] on 1st floor. If you have a lab appointment with the [facility] please come in thru the  Main Entrance and check in at the main information desk  You need to re-schedule your appointment should you arrive 10 or more minutes late.  We strive to give you quality time with our providers, and arriving late affects you and other patients whose appointments are after yours.  Also, if you no show three or more times for appointments you may be dismissed from the clinic at the providers discretion.     Again, thank you for choosing [facility].  Our hope is that these requests will decrease the amount of time that you wait before being seen by our physicians.       _____________________________________________________________  Should you have questions after your visit to [facility], please contact our office at [phone] between the hours of 8:30 a.m. and 4:30 p.m.  Voicemails left after 4:30 p.m. will not be returned until the following business day.  For prescription refill requests, have your pharmacy contact our office.         Resources For Cancer Patients and their Caregivers ? American Cancer Society: Can assist with transportation, wigs, general needs, runs Look Good Feel Better.        6513676895 ? Cancer Care: Provides financial assistance, online  support groups, medication/co-pay assistance.  1-800-813-HOPE (707)266-2205) ? [place] Assists [place] cancer patients and their families through emotional , educational and financial support.  601-030-2781 ? [place] DSS Where to apply for food stamps, Medicaid and utility assistance. 509-311-7190 ? RCATS: Transportation to medical appointments. (304)189-6847 ? Social Security Administration: May apply for disability if have a Stage IV cancer. (917)296-5529 (253) 226-0986 ? [employer], Disability and Transit Services: Assists with nutrition, care and transit needs. [place] Support Programs: '@10RELATIVEDAYS'$ @ > Cancer Support Group  2nd Tuesday of the month 1pm-2pm, Journey Room  > Creative Journey  3rd Tuesday of the month 1130am-1pm, Journey Room  > Look Good Feel Better  1st Wednesday of the month 10am-12 noon, Journey Room (Call [place] to register [phone])

## 2016-02-28 NOTE — Progress Notes (Signed)
[name] presents today for injection per MD orders. Flu vaccine administered IM in left Upper Arm. Administration without incident. Patient tolerated well.

## 2016-02-29 ENCOUNTER — Encounter (HOSPITAL_COMMUNITY): Payer: Self-pay | Admitting: Hematology & Oncology

## 2016-03-08 ENCOUNTER — Encounter (HOSPITAL_COMMUNITY): Payer: Self-pay | Admitting: Hematology & Oncology

## 2016-03-08 ENCOUNTER — Encounter (HOSPITAL_BASED_OUTPATIENT_CLINIC_OR_DEPARTMENT_OTHER): Payer: Medicare Other | Admitting: Hematology & Oncology

## 2016-03-08 ENCOUNTER — Encounter (HOSPITAL_BASED_OUTPATIENT_CLINIC_OR_DEPARTMENT_OTHER): Payer: Medicare Other

## 2016-03-08 ENCOUNTER — Ambulatory Visit (HOSPITAL_COMMUNITY): Payer: Medicare Other

## 2016-03-08 VITALS — BP 139/53 | HR 75 | Temp 97.4°F | Resp 18

## 2016-03-08 VITALS — BP 135/51 | HR 85 | Temp 97.9°F | Resp 18 | Wt 200.6 lb

## 2016-03-08 DIAGNOSIS — E538 Deficiency of other specified B group vitamins: Secondary | ICD-10-CM

## 2016-03-08 DIAGNOSIS — E119 Type 2 diabetes mellitus without complications: Secondary | ICD-10-CM | POA: Diagnosis not present

## 2016-03-08 DIAGNOSIS — R05 Cough: Secondary | ICD-10-CM

## 2016-03-08 DIAGNOSIS — Z5111 Encounter for antineoplastic chemotherapy: Secondary | ICD-10-CM

## 2016-03-08 DIAGNOSIS — Z902 Acquired absence of lung [part of]: Secondary | ICD-10-CM

## 2016-03-08 DIAGNOSIS — C3491 Malignant neoplasm of unspecified part of right bronchus or lung: Secondary | ICD-10-CM

## 2016-03-08 DIAGNOSIS — C342 Malignant neoplasm of middle lobe, bronchus or lung: Secondary | ICD-10-CM | POA: Diagnosis present

## 2016-03-08 DIAGNOSIS — R918 Other nonspecific abnormal finding of lung field: Secondary | ICD-10-CM | POA: Diagnosis not present

## 2016-03-08 LAB — CBC WITH DIFFERENTIAL/PLATELET
Basophils Absolute: 0 10*3/uL (ref 0.0–0.1)
Basophils Relative: 0 %
Eosinophils Absolute: 0 10*3/uL (ref 0.0–0.7)
Eosinophils Relative: 0 %
HCT: 34.2 % — ABNORMAL LOW (ref 39.0–52.0)
Hemoglobin: 11.6 g/dL — ABNORMAL LOW (ref 13.0–17.0)
Lymphocytes Relative: 9 %
Lymphs Abs: 1.5 10*3/uL (ref 0.7–4.0)
MCH: 29.3 pg (ref 26.0–34.0)
MCHC: 33.9 g/dL (ref 30.0–36.0)
MCV: 86.4 fL (ref 78.0–100.0)
Monocytes Absolute: 1.1 10*3/uL — ABNORMAL HIGH (ref 0.1–1.0)
Monocytes Relative: 7 %
Neutro Abs: 13.1 10*3/uL — ABNORMAL HIGH (ref 1.7–7.7)
Neutrophils Relative %: 84 %
Platelets: 443 10*3/uL — ABNORMAL HIGH (ref 150–400)
RBC: 3.96 MIL/uL — ABNORMAL LOW (ref 4.22–5.81)
RDW: 12.8 % (ref 11.5–15.5)
WBC: 15.7 10*3/uL — ABNORMAL HIGH (ref 4.0–10.5)

## 2016-03-08 LAB — COMPREHENSIVE METABOLIC PANEL
ALT: 24 U/L (ref 17–63)
AST: 27 U/L (ref 15–41)
Albumin: 3.2 g/dL — ABNORMAL LOW (ref 3.5–5.0)
Alkaline Phosphatase: 65 U/L (ref 38–126)
Anion gap: 9 (ref 5–15)
BUN: 20 mg/dL (ref 6–20)
CO2: 22 mmol/L (ref 22–32)
Calcium: 8.7 mg/dL — ABNORMAL LOW (ref 8.9–10.3)
Chloride: 99 mmol/L — ABNORMAL LOW (ref 101–111)
Creatinine, Ser: 0.93 mg/dL (ref 0.61–1.24)
GFR calc Af Amer: >60 mL/min (ref >60)
GFR calc non Af Amer: >60 mL/min (ref >60)
Glucose, Bld: 313 mg/dL — ABNORMAL HIGH (ref 65–99)
Potassium: 4.1 mmol/L (ref 3.5–5.1)
Sodium: 130 mmol/L — ABNORMAL LOW (ref 135–145)
Total Bilirubin: 0.3 mg/dL (ref 0.3–1.2)
Total Protein: 6.2 g/dL — ABNORMAL LOW (ref 6.5–8.1)

## 2016-03-08 MED ORDER — SODIUM CHLORIDE 0.9 % IV SOLN
Freq: Once | INTRAVENOUS | Status: AC
  Administered 2016-03-08: 12:00:00 via INTRAVENOUS

## 2016-03-08 MED ORDER — SODIUM CHLORIDE 0.9 % IV SOLN
1100.0000 mg | Freq: Once | INTRAVENOUS | Status: AC
  Administered 2016-03-08: 1100 mg via INTRAVENOUS
  Filled 2016-03-08: qty 40

## 2016-03-08 MED ORDER — SODIUM CHLORIDE 0.9 % IV SOLN
10.0000 mg | Freq: Once | INTRAVENOUS | Status: AC
  Administered 2016-03-08: 10 mg via INTRAVENOUS
  Filled 2016-03-08: qty 1

## 2016-03-08 MED ORDER — SODIUM CHLORIDE 0.9 % IV SOLN
484.0000 mg | Freq: Once | INTRAVENOUS | Status: AC
  Administered 2016-03-08: 480 mg via INTRAVENOUS
  Filled 2016-03-08: qty 48

## 2016-03-08 MED ORDER — CYANOCOBALAMIN 1000 MCG/ML IJ SOLN
INTRAMUSCULAR | Status: AC
  Filled 2016-03-08: qty 1

## 2016-03-08 MED ORDER — HEPARIN SOD (PORK) LOCK FLUSH 100 UNIT/ML IV SOLN
500.0000 [IU] | Freq: Once | INTRAVENOUS | Status: AC | PRN
  Administered 2016-03-08: 500 [IU]
  Filled 2016-03-08: qty 5

## 2016-03-08 MED ORDER — CYANOCOBALAMIN 1000 MCG/ML IJ SOLN
1000.0000 ug | Freq: Once | INTRAMUSCULAR | Status: AC
  Administered 2016-03-08: 1000 ug via INTRAMUSCULAR

## 2016-03-08 MED ORDER — PALONOSETRON HCL INJECTION 0.25 MG/5ML
0.2500 mg | Freq: Once | INTRAVENOUS | Status: AC
  Administered 2016-03-08: 0.25 mg via INTRAVENOUS
  Filled 2016-03-08: qty 5

## 2016-03-08 MED ORDER — INSULIN ASPART 100 UNIT/ML ~~LOC~~ SOLN
6.0000 [IU] | Freq: Once | SUBCUTANEOUS | Status: AC
  Administered 2016-03-08: 6 [IU] via SUBCUTANEOUS
  Filled 2016-03-08: qty 0.06

## 2016-03-08 MED ORDER — SODIUM CHLORIDE 0.9% FLUSH: 10.0000 mL | INTRAVENOUS | Status: DC | PRN

## 2016-03-08 NOTE — Patient Instructions (Addendum)
[place] at [facility] Discharge Instructions  RECOMMENDATIONS MADE BY THE CONSULTANT AND ANY TEST RESULTS WILL BE SENT TO YOUR REFERRING PHYSICIAN.  You saw Dr. Whitney Muse today. B12 will be given today. Follow up in 3 weeks with chemo and lab work also.  Thank you for choosing [place] at [facility] to provide your oncology and hematology care.  To afford each patient quality time with our provider, please arrive at least 15 minutes before your scheduled appointment time.   Beginning January 23rd 2017 lab work for the [facility] will be done in the  Main lab at [facility] on 1st floor. If you have a lab appointment with the [place] please come in thru the  Main Entrance and check in at the main information desk  You need to re-schedule your appointment should you arrive 10 or more minutes late.  We strive to give you quality time with our providers, and arriving late affects you and other patients whose appointments are after yours.  Also, if you no show three or more times for appointments you may be dismissed from the clinic at the providers discretion.     Again, thank you for choosing [facility].  Our hope is that these requests will decrease the amount of time that you wait before being seen by our physicians.       _____________________________________________________________  Should you have questions after your visit to [facility], please contact our office at [phone] between the hours of 8:30 a.m. and 4:30 p.m.  Voicemails left after 4:30 p.m. will not be returned until the following business day.  For prescription refill requests, have your pharmacy contact our office.         Resources For Cancer Patients and their Caregivers ? American Cancer Society: Can assist with transportation, wigs, general needs, runs Look Good Feel Better.        (904)399-8771 ? Cancer Care: Provides  financial assistance, online support groups, medication/co-pay assistance.  1-800-813-HOPE (314)505-1606) ? [place] Assists [place] cancer patients and their families through emotional , educational and financial support.  (505) 418-5695 ? [place] DSS Where to apply for food stamps, Medicaid and utility assistance. (364) 132-8982 ? RCATS: Transportation to medical appointments. (430)478-7429 ? Social Security Administration: May apply for disability if have a Stage IV cancer. 806-038-8359 402-824-6844 ? [employer], Disability and Transit Services: Assists with nutrition, care and transit needs. [place] Support Programs: '@10RELATIVEDAYS'$ @ > Cancer Support Group  2nd Tuesday of the month 1pm-2pm, Journey Room  > Creative Journey  3rd Tuesday of the month 1130am-1pm, Journey Room  > Look Good Feel Better  1st Wednesday of the month 10am-12 noon, Journey Room (Call [place] to register [phone])

## 2016-03-08 NOTE — Progress Notes (Signed)
[facility] Hematology/Oncology Consultation   Name: [name]      MRN: [id number]    Date: 03/08/2016 Time:9:55 AM   REFERRING PHYSICIAN:  Deberah Castle, NP (GI)  REASON FOR CONSULT:  "Lung Cancer"   DIAGNOSIS:      Adenocarcinoma of right lung, stage 3 ([place])   11/10/2015 Imaging    13 mm spiculated lesion seen in right middle lobe.      11/30/2015 PET scan    Spiculated right middle lobe nodule is mildly hypermetabolic and most consistent with a stage IA adenocarcinoma.      12/30/2015 Surgery    Right middle lobectomy and node dissection      12/30/2015 Procedure    Right video-assisted thoracoscopy, Thoracoscopic right middle lobectomy, Mediastinal lymph node dissection, and On-Q local anesthetic catheter placement.      01/02/2016 Pathology Results    1. Lung, resection (segmental or lobe), Right Middle Lobe - INVASIVE ADENOCARCINOMA, WELL DIFFERENTIATED, SPANNING 1.2 CM. - THE SURGICAL RESECTION MARGINS ARE NEGATIVE FOR CARCINOMA. 2. Lymph node, biopsy, Level 12 - METASTATIC CARCINOMA IN 1 OF 1 LYMPH NODE (1/1). 3. Lymph node, biopsy, Level 12 #2 - METASTATIC CARCINOMA IN 1 OF 1 LYMPH NODE (1/1). 4. Lymph node, biopsy, Level 7 - METASTATIC CARCINOMA IN 1 OF 1 LYMPH NODE (1/1/). 5. Lymph node, biopsy, Level 7 #2 - METASTATIC CARCINOMA IN 1 OF 1 LYMPH NODE (1/1). 6. Lymph node, biopsy, Level 7 #3 - METASTATIC CARCINOMA IN 1 OF 1 LYMPH NODE (1/1). 7. Lymph node, biopsy, Level 7 #4 - METASTATIC CARCINOMA IN 1 OF 1 LYMPH NODE (1/1). 8. Lymph node, biopsy, 4R - THERE IS NO EVIDENCE OF CARCINOMA IN 1 OF 1 LYMPH NODE (0/1). 9. Lymph node, biopsy, 4R #2 - THERE IS NO EVIDENCE OF CARCINOMA IN 1 OF 1 LYMPH NODE (0/1).      01/05/2016 Pathology Results    PDL1 NEGATIVE- tumor proportion score of 0%.       01/05/2016 Pathology Results    Genomic alterations identified: BRAF V600E, KIT amplification, PDGFRA amplification, CDKN2A/B loss, TP53  S32f*33.  Additional findings: MSI-STABLE.  No reportable alterations identified: EGFR, KRAS, ALK, MET, RET, ERBB2, ROS1      02/09/2016 Procedure    Port placed by IR.      02/16/2016 -  Chemotherapy    The patient had palonosetron (ALOXI) injection 0.25 mg, 0.25 mg, Intravenous,  Once, 0 of 4 cycles  PEMEtrexed (ALIMTA) 1,050 mg in sodium chloride 0.9 % 100 mL chemo infusion, 500 mg/m2, Intravenous,  Once, 0 of 4 cycles  CARBOplatin (PARAPLATIN) in sodium chloride 0.9 % 100 mL chemo infusion, , Intravenous,  Once, 0 of 4 cycles  palonosetron (ALOXI) injection 0.25 mg, 0.25 mg, Intravenous,  Once, 1 of 6 cycles  PEMEtrexed (ALIMTA) 1,050 mg in sodium chloride 0.9 % 100 mL chemo infusion, 500 mg/m2 = 1,050 mg, Intravenous,  Once, 1 of 6 cycles  CARBOplatin (PARAPLATIN) 480 mg in sodium chloride 0.9 % 250 mL chemo infusion, 480 mg (100 % of original dose 484 mg), Intravenous,  Once, 1 of 6 cycles Dose modification:   (original dose 484 mg, Cycle 1)  for chemotherapy treatment.         HISTORY OF PRESENT ILLNESS:   Gary SLUDERis a 82y.o. male who presents today for further follow-up of a stage III adenocarcinoma of the lung. He presents for ongoing carboplatin/alimta.   Patient was  diagnosed with vitamin B12 deficiency in June and now receives monthly injections. Patient is due for vitamin B12 injection today. He has noticed a significant improvement in his neuropathy with his B12 replacement and is relieved by this.   Mr. Lever denies weight loss, diarrhea, or vomiting. He says he feels great and, yesterday, he took a long walk around [place]. Patient also says he has been eating well.  No fever or chills. No CP, no  Cough or SOB.    Review of Systems  Constitutional: Negative for chills, fever, malaise/fatigue and weight loss.       Uses a walker  HENT: Negative for congestion, sore throat and tinnitus.   Eyes: Negative for blurred vision and double vision.  Respiratory:  Negative for cough, hemoptysis and shortness of breath.   Cardiovascular: Positive for leg swelling. Negative for chest pain, palpitations and orthopnea.       Edema since surgery  Gastrointestinal: Negative for abdominal pain, blood in stool, constipation, diarrhea, melena, nausea and vomiting.  Genitourinary: Negative for dysuria and urgency.  Musculoskeletal: Negative.   Skin: Negative for itching and rash.  Neurological: Negative.  Negative for dizziness, sensory change, speech change, focal weakness, seizures, loss of consciousness, weakness and headaches.  Endo/Heme/Allergies: Negative.   Psychiatric/Behavioral: Negative.   14 point review of systems was performed and is negative except as detailed under history of present illness and above   PAST MEDICAL HISTORY:   Past Medical History:  Diagnosis Date  . Adenocarcinoma of lung, stage 3 (HCC)    Stage IIIA  . Adenocarcinoma of right lung, stage 3 ([place]) 12/02/2015  . Arthritis   . Atrial fibrillation, transient ([place])    transient postop, < 24 hours  . B12 deficiency   . Cyst of scrotum   . Diverticulitis   . GERD (gastroesophageal reflux disease)   . Hernia   . History of pneumonia   . Hyperlipidemia   . Lung nodule    Spiculated right middle lobe nodule, hypermetabolic  . Type 2 diabetes mellitus (HCC)     ALLERGIES: Allergies  Allergen Reactions  . Morphine And Related Other (See Comments)    confusion      MEDICATIONS: I have reviewed the patient's current medications.    Current Outpatient Prescriptions on File Prior to Visit  Medication Sig Dispense Refill  . aspirin EC 81 MG tablet Take 81 mg by mouth daily.    Marland Kitchen CARBOPLATIN IV Inject into the vein. Every 3 weeks    . Cholecalciferol (VITAMIN D) 2000 UNITS CAPS Take 1 capsule by mouth daily.     . Cyanocobalamin (VITAMIN B-12 IJ) Inject 1 Dose as directed every 30 (thirty) days.    Marland Kitchen dexamethasone (DECADRON) 4 MG tablet The day before, day of, and day after  chemo take 2 tablets in the am and 2 tablets in the pm with food. 36 tablet 1  . folic acid (FOLVITE) 1 MG tablet Take 1 tablet daily and then take for 21 days past the completion of pemetrexed chemo. 30 tablet 6  . glipiZIDE (GLUCOTROL) 5 MG tablet Take 1 tablet (5 mg total) by mouth 2 (two) times daily before a meal. 60 tablet 1  . HYDROcodone-acetaminophen (NORCO/VICODIN) 5-325 MG tablet Take 1-2 tablets by mouth every 4 (four) hours as needed for moderate pain. 30 tablet 0  . lidocaine-prilocaine (EMLA) cream Apply a quarter size amount to port site 1 hour prior to chemo. Do not rub in. Cover with plastic wrap.  30 g 3  . lisinopril (PRINIVIL,ZESTRIL) 40 MG tablet Take 1 tablet (40 mg total) by mouth daily. 30 tablet 1  . loratadine (CLARITIN) 10 MG tablet Take 10 mg by mouth daily.    . meclizine (ANTIVERT) 25 MG tablet Take 25 mg by mouth daily as needed for dizziness.    . ondansetron (ZOFRAN) 8 MG tablet Take 1 tablet (8 mg total) by mouth every 8 (eight) hours as needed for nausea or vomiting. 30 tablet 2  . oxyCODONE (OXY IR/ROXICODONE) 5 MG immediate release tablet Take 1-2 tablets (5-10 mg total) by mouth every 6 (six) hours as needed for severe pain. 30 tablet 0  . pantoprazole (PROTONIX) 40 MG tablet Take 40 mg by mouth daily.     Marland Kitchen PEMEtrexed Disodium (ALIMTA IV) Inject into the vein. Every 3 weeks    . polyethylene glycol (MIRALAX / GLYCOLAX) packet Take 17 g by mouth daily.    . prochlorperazine (COMPAZINE) 10 MG tablet Take 1 tablet (10 mg total) by mouth every 6 (six) hours as needed for nausea or vomiting. 30 tablet 2  . psyllium (METAMUCIL) 58.6 % powder Take 1 packet by mouth daily.    . simvastatin (ZOCOR) 80 MG tablet Take 40 mg by mouth at bedtime.     Marland Kitchen terazosin (HYTRIN) 5 MG capsule Take 5 mg by mouth at bedtime.     No current facility-administered medications on file prior to visit.      PAST SURGICAL HISTORY Past Surgical History:  Procedure Laterality Date  .  BACK SURGERY    . CHOLECYSTECTOMY    . COLONOSCOPY N/A 11/24/2015   Procedure: COLONOSCOPY;  Surgeon: Rogene Houston, MD;  Location: [facility];  Service: Endoscopy;  Laterality: N/A;  730  . COLONOSCOPY W/ POLYPECTOMY     x 5  . CYST EXCISION     Scrotum  . EYE SURGERY Bilateral    Cataract with Lens  . HERNIA REPAIR Right    Inguinal  . IR GENERIC HISTORICAL  02/09/2016   IR US GUIDE VASC ACCESS RIGHT 02/09/2016 Arne Cleveland, MD [facility]  . IR GENERIC HISTORICAL  02/09/2016   IR FLUORO GUIDE CV LINE RIGHT 02/09/2016 Arne Cleveland, MD [facility]  . LUMBAR DISC SURGERY     per patient report  . VIDEO ASSISTED THORACOSCOPY (VATS)/ LOBECTOMY Right 12/30/2015   Procedure: VIDEO ASSISTED THORACOSCOPY (VATS)/RIGHT MIDDLE LOBECTOMY;  Surgeon: Melrose Nakayama, MD;  Location: [facility];  Service: Thoracic;  Laterality: Right;    FAMILY HISTORY: Family History  Problem Relation Age of Onset  . Colon cancer Brother   Mother died at the age of 24 secondary to complications associated with childbirth. Fatjer passed in his 52's from a possible stroke versus blood clot. He has 1 brother who is 53 years old He has 1 sister who is 86 years old He has another borther who is 80 years old.   His youngest brother is 37 years old.  1 brother is deceased at the age of 44 secondary to colon cancer.  SOCIAL HISTORY: He is an ex-smoker, quitting about 15 years ago after smoking 0.5ppd x 50 years.  He does not drink EtOH, but he admits that he used to drink EtOH.  He denies any illicit drug abuse.  He denies any religious affiliation.  He is a retired Magazine features editor.  Social History   Social History  . Marital status: Married    Spouse name: N/A  .  Number of children: N/A  . Years of education: N/A   Social History Main Topics  . Smoking status: Former Smoker    Types: Cigarettes    Quit date: 05/27/2004  . Smokeless tobacco: Never Used  . Alcohol use No  . Drug use: No  .  Sexual activity: Not Asked   Other Topics Concern  . None   Social History Narrative  . None    PERFORMANCE STATUS: The patient's performance status is 1 - Symptomatic but completely ambulatory  PHYSICAL EXAM: Most Recent Vital Signs: Blood pressure (!) 135/51, pulse 85, temperature 97.9 F (36.6 C), temperature source Oral, resp. rate 18, weight 200 lb 9.6 oz (91 kg), SpO2 95 %. BP (!) 135/51 (BP Location: Left Arm, Patient Position: Sitting)   Pulse 85   Temp 97.9 F (36.6 C) (Oral)   Resp 18   Wt 200 lb 9.6 oz (91 kg)   SpO2 95%   BMI 29.62 kg/m   General Appearance:    Alert, cooperative, no distress, appears stated age, accompanied by his wife.  Head:    Normocephalic, without obvious abnormality, atraumatic  Eyes:    Conjunctiva/corneas clear, EOM's intact       Ears:    External anatomy is normal.  Nose:   Nares normal, mucosa normal, no drainage or sinus tenderness  Throat:   Lips, mucosa, and tongue normal  Neck:   Supple, symmetrical, trachea midline, no adenopathy;       thyroid:  No enlargement/tenderness/nodules  Back:     Symmetric, no curvature, ROM normal, no CVA tenderness  Lungs:     Clear to auscultation bilaterally, respirations unlabored  Chest wall:    No tenderness or deformity Several incision sites all well healed. Steri strips in place. No edema noted.  Heart:    Regular rate and rhythm, S1 and S2 normal, no murmur, rub   or gallop  Abdomen:     Soft, non-tender, bowel sounds active all four quadrants,    no masses, no organomegaly        Extremities:   Extremities normal, atraumatic, no cyanosis or edema  Pulses:   2+ and symmetric all extremities  Skin:   Skin color, texture, turgor normal, no rashes or lesions  Lymph nodes:   Cervical, supraclavicular, and axillary nodes normal  Neurologic:   CNII-XII intact. Normal strength, sensation and reflexes      throughout    LABORATORY DATA:  I have reviewed the data as listed. CBC      Component Value Date/Time   WBC 3.8 (L) 02/27/2016 1121   RBC 3.81 (L) 02/27/2016 1121   HGB 11.3 (L) 02/27/2016 1121   HCT 33.7 (L) 02/27/2016 1121   PLT 190 02/27/2016 1121   MCV 88.5 02/27/2016 1121   MCH 29.7 02/27/2016 1121   MCHC 33.5 02/27/2016 1121   RDW 12.5 02/27/2016 1121   LYMPHSABS 1.7 02/27/2016 1121   MONOABS 0.7 02/27/2016 1121   EOSABS 0.1 02/27/2016 1121   BASOSABS 0.0 02/27/2016 1121      Chemistry      Component Value Date/Time   NA 136 02/27/2016 1121   K 4.6 02/27/2016 1121   CL 102 02/27/2016 1121   CO2 29 02/27/2016 1121   BUN 7 02/27/2016 1121   CREATININE 0.93 02/27/2016 1121   CREATININE 0.95 11/03/2015 1452      Component Value Date/Time   CALCIUM 8.7 (L) 02/27/2016 1121   ALKPHOS 73 02/27/2016 1121   AST  22 02/27/2016 1121   ALT 30 02/27/2016 1121   BILITOT 0.5 02/27/2016 1121     Lab Results  Component Value Date   IRON 71 12/07/2015   TIBC 302 12/07/2015   FERRITIN 40 12/07/2015   Lab Results  Component Value Date   VITAMINB12 170 (L) 12/07/2015   Lab Results  Component Value Date   FOLATE 39.9 12/07/2015     RADIOGRAPHY: I have personally reviewed the radiological images as listed and agreed with the findings in the report.  Study Result   CLINICAL DATA:  Right middle lobectomy.  EXAM: CHEST  2 VIEW  COMPARISON:  01/03/2016.  FINDINGS: Mediastinum hilar structures normal. Cardiomegaly with normal pulmonary vascularity. Postsurgical changes right lung with right base atelectasis and or scarring. No pneumothorax. No acute bony abnormality.  IMPRESSION: 1. Postsurgical changes right lung. Right base atelectasis and or scarring. No pneumothorax.  2.  No acute cardiopulmonary disease.  Stable cardiomegaly .   Electronically Signed   By: Marcello Moores  Register   On: 01/20/2016 10:11     PATHOLOGY:     Diagnosis Colon, polyp(s), ascending - TUBULAR ADENOMA(S). - HIGH GRADE DYSPLASIA IS NOT  IDENTIFIED. Casimer Lanius MD Pathologist, Electronic Signature (Case signed 11/25/2015)   ASSESSMENT/PLAN:  RML nodule Stage IIIA NSCLC, T1a,N2,M0 Post op Afib RM lobectomy, mediastinal LN dissection  He is currently on Carboplatin/Alimta and has done well. No indication for XRT given no bulky N2 disease.   He is eating well. Weight is overall stable. I have encouraged him to continue with boost/ensure once daily.  B12 deficiency B12 deficiency requiring IM B12 replacement. Continue ongoing replacement.  He has noticed a significant improvement in his neuropathy.   Antibody testing for pernicious anemia negative. I will check a B12 level today as he is requesting B12 more often ie. He feels the effects "wear off" prior to his next injection.   Cough  Improved. Will continue to monitor.   Follow-up with patient on 03/29/2016 prior to cycle #3 of therapy.   All questions were answered. The patient knows to call the clinic with any problems, questions or concerns. We can certainly see the patient much sooner if necessary.  This document serves as a record of services personally performed by Ancil Linsey, MD. It was created on her behalf by Elmyra Ricks, a trained medical scribe. The creation of this record is based on the scribe's personal observations and the provider's statements to them. This document has been checked and approved by the attending provider.  I have reviewed the above documentation for accuracy and completeness, and I agree with the above.  This note is electronically signed [id number] Cyril Mourning, MD  03/08/2016 9:55 AM

## 2016-03-08 NOTE — Patient Instructions (Signed)
[facility] Discharge Instructions for Patients Receiving Chemotherapy   Beginning January 23rd 2017 lab work for the [facility] will be done in the  Main lab at [facility] on 1st floor. If you have a lab appointment with the [facility] please come in thru the  Main Entrance and check in at the main information desk   Today you received the following chemotherapy agents:  Alimta and carboplatin  If you develop nausea and vomiting, or diarrhea that is not controlled by your medication, call the clinic.  The clinic phone number is [phone]. Office hours are Monday-Friday 8:30am-5:00pm.  BELOW ARE SYMPTOMS THAT SHOULD BE REPORTED IMMEDIATELY:  *FEVER GREATER THAN 101.0 F  *CHILLS WITH OR WITHOUT FEVER  NAUSEA AND VOMITING THAT IS NOT CONTROLLED WITH YOUR NAUSEA MEDICATION  *UNUSUAL SHORTNESS OF BREATH  *UNUSUAL BRUISING OR BLEEDING  TENDERNESS IN MOUTH AND THROAT WITH OR WITHOUT PRESENCE OF ULCERS  *URINARY PROBLEMS  *BOWEL PROBLEMS  UNUSUAL RASH Items with * indicate a potential emergency and should be followed up as soon as possible. If you have an emergency after office hours please contact your primary care physician or go to the nearest emergency department.  Please call the clinic during office hours if you have any questions or concerns.   You may also contact the Patient Navigator at [phone] should you have any questions or need assistance in obtaining follow up care.      Resources For Cancer Patients and their Caregivers ? American Cancer Society: Can assist with transportation, wigs, general needs, runs Look Good Feel Better.        339-311-8005 ? Cancer Care: Provides financial assistance, online support groups, medication/co-pay assistance.  1-800-813-HOPE 737 030 1521) ? [facility] Assists [place] cancer patients and their families through emotional , educational and financial support.   716-511-1664 ? [place] DSS Where to apply for food stamps, Medicaid and utility assistance. 340-562-3632 ? [facility]: Transportation to medical appointments. 601 045 3393 ? Social Security Administration: May apply for disability if have a Stage IV cancer. (415) 835-5158 (806) 346-8295 ? [employer], Disability and Transit Services: Assists with nutrition, care and transit needs. 224-192-5011

## 2016-03-08 NOTE — Progress Notes (Signed)
Dr. Whitney Muse aware of lab results; pt denies fever, chills, burning with urination, cough, or any other s/s of infection.  Okay to tx today per MD.   Tolerated tx w/o adverse reaction.  Alert, in no distress.  VSS.  Discharged ambulatory in c/o spouse.

## 2016-03-23 ENCOUNTER — Other Ambulatory Visit (HOSPITAL_COMMUNITY): Payer: Self-pay | Admitting: Oncology

## 2016-03-23 ENCOUNTER — Telehealth (HOSPITAL_COMMUNITY): Payer: Self-pay

## 2016-03-23 DIAGNOSIS — J069 Acute upper respiratory infection, unspecified: Secondary | ICD-10-CM

## 2016-03-23 MED ORDER — AZITHROMYCIN 250 MG PO TABS: ORAL_TABLET | ORAL | 0 refills | Status: DC

## 2016-03-23 NOTE — Telephone Encounter (Signed)
Patients wife called stating patient has a " bad cold" and is scheduled for chemo next week. His symptoms include, watery eyes, runny nose, cough with clear phlegm. He has not run a fever. He has been using Claritin and Delsym with little to no relief. Wife really wants him to have an antibiotic because he is due for chemo soon. Reviewed with PA-C, Z- pack escribed to [facility] in [place]. Notified wife, who requested the prescription go to [facility] in [place]. Resent prescription. Wife verbalized understanding.

## 2016-03-26 ENCOUNTER — Other Ambulatory Visit: Payer: Self-pay | Admitting: Thoracic Surgery (Cardiothoracic Vascular Surgery)

## 2016-03-26 DIAGNOSIS — C3490 Malignant neoplasm of unspecified part of unspecified bronchus or lung: Secondary | ICD-10-CM

## 2016-03-27 ENCOUNTER — Ambulatory Visit
Admission: RE | Admit: 2016-03-27 | Discharge: 2016-03-27 | Disposition: A | Discharge status: Home / Self Care | Payer: Medicare Other | Source: Ambulatory Visit | Attending: Thoracic Surgery (Cardiothoracic Vascular Surgery) | Admitting: Thoracic Surgery (Cardiothoracic Vascular Surgery)

## 2016-03-27 ENCOUNTER — Ambulatory Visit (INDEPENDENT_AMBULATORY_CARE_PROVIDER_SITE_OTHER): Payer: Self-pay | Admitting: Thoracic Surgery (Cardiothoracic Vascular Surgery)

## 2016-03-27 ENCOUNTER — Encounter: Payer: Self-pay | Admitting: Thoracic Surgery (Cardiothoracic Vascular Surgery)

## 2016-03-27 VITALS — BP 128/66 | HR 100 | Resp 20 | Ht 69.0 in | Wt 199.0 lb

## 2016-03-27 DIAGNOSIS — C3490 Malignant neoplasm of unspecified part of unspecified bronchus or lung: Secondary | ICD-10-CM

## 2016-03-27 DIAGNOSIS — Z902 Acquired absence of lung [part of]: Secondary | ICD-10-CM

## 2016-03-27 DIAGNOSIS — C342 Malignant neoplasm of middle lobe, bronchus or lung: Secondary | ICD-10-CM

## 2016-03-27 NOTE — Progress Notes (Signed)
[address]       [place] [address]             [phone]       HPI: [name] returns for a scheduled 3 month follow-up visit.  He [age]-year-old man who had a thoracoscopic right middle lobectomy and node dissection in July 2017. He had some transient atrial fibrillation postoperatively. Otherwise he did well. He was discharged on day 6. He was clinical stage Ib, but pathologic stage IIIa (T1a, N2).  He currently has finished 2 of 6 cycles of carboplatin and Alimta. He is due for his next cycle on Thursday.  He feels well. He's had a runny nose and watery eyes recently. He was treated with azithromycin for possible upper respiratory tract infection. There may be an allergy component as well. Is taking Claritin for that.  He is not having any pain at baseline. He says it does hurt a little when he coughs. He also denies shortness of breath. He is tolerating chemotherapy well so far.  Past Medical History:  Diagnosis Date  . Adenocarcinoma of lung, stage 3 (HCC)    Stage IIIA  . Adenocarcinoma of right lung, stage 3 ([place]) 12/02/2015  . Arthritis   . Atrial fibrillation, transient ([place])    transient postop, < 24 hours  . B12 deficiency   . Cyst of scrotum   . Diverticulitis   . GERD (gastroesophageal reflux disease)   . Hernia   . History of pneumonia   . Hyperlipidemia   . Lung nodule    Spiculated right middle lobe nodule, hypermetabolic  . Type 2 diabetes mellitus ([place])      Current Outpatient Prescriptions  Medication Sig Dispense Refill  . aspirin EC 81 MG tablet Take 81 mg by mouth daily.    Marland Kitchen azithromycin (ZITHROMAX) 250 MG tablet Take as directed 6 each 0  . CARBOPLATIN IV Inject into the vein. Every 3 weeks    . Cholecalciferol (VITAMIN D) 2000 UNITS CAPS Take 1 capsule by mouth daily.     . Cyanocobalamin (VITAMIN B-12 IJ) Inject 1 Dose as directed every 30 (thirty) days.    Marland Kitchen dexamethasone (DECADRON) 4 MG tablet The day before, day  of, and day after chemo take 2 tablets in the am and 2 tablets in the pm with food. 36 tablet 1  . folic acid (FOLVITE) 1 MG tablet Take 1 tablet daily and then take for 21 days past the completion of pemetrexed chemo. 30 tablet 6  . glipiZIDE (GLUCOTROL) 5 MG tablet Take 1 tablet (5 mg total) by mouth 2 (two) times daily before a meal. 60 tablet 1  . HYDROcodone-acetaminophen (NORCO/VICODIN) 5-325 MG tablet Take 1-2 tablets by mouth every 4 (four) hours as needed for moderate pain. 30 tablet 0  . lidocaine-prilocaine (EMLA) cream Apply a quarter size amount to port site 1 hour prior to chemo. Do not rub in. Cover with plastic wrap. 30 g 3  . lisinopril (PRINIVIL,ZESTRIL) 40 MG tablet Take 1 tablet (40 mg total) by mouth daily. 30 tablet 1  . loratadine (CLARITIN) 10 MG tablet Take 10 mg by mouth daily.    . meclizine (ANTIVERT) 25 MG tablet Take 25 mg by mouth daily as needed for dizziness.    . ondansetron (ZOFRAN) 8 MG tablet Take 1 tablet (8 mg total) by mouth every 8 (eight) hours as needed for nausea or vomiting. 30 tablet 2  . oxyCODONE (OXY  IR/ROXICODONE) 5 MG immediate release tablet Take 1-2 tablets (5-10 mg total) by mouth every 6 (six) hours as needed for severe pain. 30 tablet 0  . pantoprazole (PROTONIX) 40 MG tablet Take 40 mg by mouth daily.     Marland Kitchen PEMEtrexed Disodium (ALIMTA IV) Inject into the vein. Every 3 weeks    . polyethylene glycol (MIRALAX / GLYCOLAX) packet Take 17 g by mouth daily.    . prochlorperazine (COMPAZINE) 10 MG tablet Take 1 tablet (10 mg total) by mouth every 6 (six) hours as needed for nausea or vomiting. 30 tablet 2  . psyllium (METAMUCIL) 58.6 % powder Take 1 packet by mouth daily.    . simvastatin (ZOCOR) 80 MG tablet Take 40 mg by mouth at bedtime.     Marland Kitchen terazosin (HYTRIN) 5 MG capsule Take 5 mg by mouth at bedtime.    Marland Kitchen tiZANidine (ZANAFLEX) 4 MG tablet Take 4 mg by mouth at bedtime.     No current facility-administered medications for this visit.      Physical Exam BP 128/66 (BP Location: Left Arm, Patient Position: Sitting, Cuff Size: Normal)   Pulse 100   Resp 20   Ht '5\' 9"'$  (1.753 m)   Wt 199 lb (90.3 kg)   SpO2 93% Comment: RA  BMI 29.36 kg/m  83 year old man in no acute distress Alert and oriented 3 with no focal deficits No cervical or supraclavicular adenopathy Port-A-Cath in place, incisions well healed Right chest incisions well healed Lungs slightly diminished the right base, otherwise clear Cardiac regular rate and rhythm normal S1 and S2  Diagnostic Tests: CHEST  2 VIEW  COMPARISON:  01/20/2016  FINDINGS: There is a right chest wall port a catheter with tip in the projection of the cavoatrial junction. No pleural effusion or edema. Postoperative appearance of the right lung with volume loss and asymmetric elevation of the right hemidiaphragm. There is spondylosis throughout the thoracic spine.  IMPRESSION: 1. No acute cardiopulmonary abnormalities. 2. Stable postoperative appearance of the right lung.   Electronically Signed   By: Kerby Moors M.D.   On: 03/27/2016 10:11 I personally reviewed the chest x-ray and concur with the findings noted above.  Impression: 82 year old man who is now 3 months out from a thoracoscopic right middle lobectomy and node dissection. He turned out to have stage IIIA disease. He is currently receiving chemotherapy with carboplatin and Alimta. I think he is scheduled to have radiation after completion of chemotherapy.  Overall I'm amazed at how good he looks. He doesn't have any surgical issues at this time.  Plan: As he is being treated and followed by Dr. Whitney Muse to [facility], he does not need to follow up with me. However, I am happy to see him again any time in the future if I can be of any further assistance with his care.  Melrose Nakayama, MD [facility] [phone]

## 2016-03-28 NOTE — Progress Notes (Signed)
[facility] Hematology/Oncology Progress Note  Name: [name]      MRN: [id number]    Date: 03/30/2016 Time:6:36 PM   REFERRING PHYSICIAN:  Deberah Castle, NP (GI)  REASON FOR CONSULT:  "Lung Cancer"   DIAGNOSIS:      Adenocarcinoma of right lung, stage 3 ([place])   11/10/2015 Imaging    13 mm spiculated lesion seen in right middle lobe.      11/30/2015 PET scan    Spiculated right middle lobe nodule is mildly hypermetabolic and most consistent with a stage IA adenocarcinoma.      12/30/2015 Surgery    Right middle lobectomy and node dissection      12/30/2015 Procedure    Right video-assisted thoracoscopy, Thoracoscopic right middle lobectomy, Mediastinal lymph node dissection, and On-Q local anesthetic catheter placement.      01/02/2016 Pathology Results    1. Lung, resection (segmental or lobe), Right Middle Lobe - INVASIVE ADENOCARCINOMA, WELL DIFFERENTIATED, SPANNING 1.2 CM. - THE SURGICAL RESECTION MARGINS ARE NEGATIVE FOR CARCINOMA. 2. Lymph node, biopsy, Level 12 - METASTATIC CARCINOMA IN 1 OF 1 LYMPH NODE (1/1). 3. Lymph node, biopsy, Level 12 #2 - METASTATIC CARCINOMA IN 1 OF 1 LYMPH NODE (1/1). 4. Lymph node, biopsy, Level 7 - METASTATIC CARCINOMA IN 1 OF 1 LYMPH NODE (1/1/). 5. Lymph node, biopsy, Level 7 #2 - METASTATIC CARCINOMA IN 1 OF 1 LYMPH NODE (1/1). 6. Lymph node, biopsy, Level 7 #3 - METASTATIC CARCINOMA IN 1 OF 1 LYMPH NODE (1/1). 7. Lymph node, biopsy, Level 7 #4 - METASTATIC CARCINOMA IN 1 OF 1 LYMPH NODE (1/1). 8. Lymph node, biopsy, 4R - THERE IS NO EVIDENCE OF CARCINOMA IN 1 OF 1 LYMPH NODE (0/1). 9. Lymph node, biopsy, 4R #2 - THERE IS NO EVIDENCE OF CARCINOMA IN 1 OF 1 LYMPH NODE (0/1).      01/05/2016 Pathology Results    PDL1 NEGATIVE- tumor proportion score of 0%.       01/05/2016 Pathology Results    Genomic alterations identified: BRAF V600E, KIT amplification, PDGFRA amplification, CDKN2A/B loss, TP53  S30f*33.  Additional findings: MSI-STABLE.  No reportable alterations identified: EGFR, KRAS, ALK, MET, RET, ERBB2, ROS1      02/09/2016 Procedure    Port placed by IR.      02/16/2016 -  Chemotherapy    The patient had palonosetron (ALOXI) injection 0.25 mg, 0.25 mg, Intravenous,  Once, 0 of 4 cycles  PEMEtrexed (ALIMTA) 1,050 mg in sodium chloride 0.9 % 100 mL chemo infusion, 500 mg/m2, Intravenous,  Once, 0 of 4 cycles  CARBOplatin (PARAPLATIN) in sodium chloride 0.9 % 100 mL chemo infusion, , Intravenous,  Once, 0 of 4 cycles  palonosetron (ALOXI) injection 0.25 mg, 0.25 mg, Intravenous,  Once, 1 of 6 cycles  PEMEtrexed (ALIMTA) 1,050 mg in sodium chloride 0.9 % 100 mL chemo infusion, 500 mg/m2 = 1,050 mg, Intravenous,  Once, 1 of 6 cycles  CARBOplatin (PARAPLATIN) 480 mg in sodium chloride 0.9 % 250 mL chemo infusion, 480 mg (100 % of original dose 484 mg), Intravenous,  Once, 1 of 6 cycles Dose modification:   (original dose 484 mg, Cycle 1)  for chemotherapy treatment.         HISTORY OF PRESENT ILLNESS:   Gary MELESKIis a 82y.o. male who presents today for further follow-up of a stage III adenocarcinoma of the lung. He continues on carboplatin/alimta. He is doing really well.  Appetite is good. He saw Dr. Roxan Hockey on 10/17. He notes that no further follow-up has been scheduled.  No cough, no SOB. He remains as active as he is able. No fever or chills.   Patient was diagnosed with vitamin B12 deficiency in June and now receives monthly injections. He notes that his neuropathy in his feet his remarkably better.   He is here today with his wife. He has no complaints or concerns today.   Review of Systems  Constitutional: Negative for chills, fever, malaise/fatigue and weight loss.       Uses a walker  HENT: Negative for congestion, sore throat and tinnitus.   Eyes: Negative for blurred vision and double vision.  Respiratory: Negative for cough, hemoptysis and  shortness of breath.   Cardiovascular: Negative for chest pain, palpitations, orthopnea and leg swelling.  Gastrointestinal: Negative for abdominal pain, blood in stool, constipation, diarrhea, melena, nausea and vomiting.  Genitourinary: Negative for dysuria and urgency.  Musculoskeletal: Negative.   Skin: Negative for itching and rash.  Neurological: Negative.  Negative for dizziness, sensory change, speech change, focal weakness, seizures, loss of consciousness, weakness and headaches.  Endo/Heme/Allergies: Negative.   Psychiatric/Behavioral: Negative.   14 point review of systems was performed and is negative except as detailed under history of present illness and above   PAST MEDICAL HISTORY:   Past Medical History:  Diagnosis Date  . Adenocarcinoma of lung, stage 3 (HCC)    Stage IIIA  . Adenocarcinoma of right lung, stage 3 ([place]) 12/02/2015  . Arthritis   . Atrial fibrillation, transient ([place])    transient postop, < 24 hours  . B12 deficiency   . Cyst of scrotum   . Diverticulitis   . GERD (gastroesophageal reflux disease)   . Hernia   . History of pneumonia   . Hyperlipidemia   . Lung nodule    Spiculated right middle lobe nodule, hypermetabolic  . Type 2 diabetes mellitus (HCC)     ALLERGIES: Allergies  Allergen Reactions  . Morphine And Related Other (See Comments)    confusion      MEDICATIONS: I have reviewed the patient's current medications.    Current Outpatient Prescriptions on File Prior to Visit  Medication Sig Dispense Refill  . aspirin EC 81 MG tablet Take 81 mg by mouth daily.    Marland Kitchen azithromycin (ZITHROMAX) 250 MG tablet Take as directed 6 each 0  . CARBOPLATIN IV Inject into the vein. Every 3 weeks    . Cholecalciferol (VITAMIN D) 2000 UNITS CAPS Take 1 capsule by mouth daily.     . Cyanocobalamin (VITAMIN B-12 IJ) Inject 1 Dose as directed every 30 (thirty) days.    Marland Kitchen dexamethasone (DECADRON) 4 MG tablet The day before, day of, and day after chemo  take 2 tablets in the am and 2 tablets in the pm with food. 36 tablet 1  . folic acid (FOLVITE) 1 MG tablet Take 1 tablet daily and then take for 21 days past the completion of pemetrexed chemo. 30 tablet 6  . glipiZIDE (GLUCOTROL) 5 MG tablet Take 1 tablet (5 mg total) by mouth 2 (two) times daily before a meal. 60 tablet 1  . HYDROcodone-acetaminophen (NORCO/VICODIN) 5-325 MG tablet Take 1-2 tablets by mouth every 4 (four) hours as needed for moderate pain. 30 tablet 0  . lidocaine-prilocaine (EMLA) cream Apply a quarter size amount to port site 1 hour prior to chemo. Do not rub in. Cover with plastic wrap. 30 g 3  .  lisinopril (PRINIVIL,ZESTRIL) 40 MG tablet Take 1 tablet (40 mg total) by mouth daily. 30 tablet 1  . loratadine (CLARITIN) 10 MG tablet Take 10 mg by mouth daily.    . meclizine (ANTIVERT) 25 MG tablet Take 25 mg by mouth daily as needed for dizziness.    . ondansetron (ZOFRAN) 8 MG tablet Take 1 tablet (8 mg total) by mouth every 8 (eight) hours as needed for nausea or vomiting. 30 tablet 2  . oxyCODONE (OXY IR/ROXICODONE) 5 MG immediate release tablet Take 1-2 tablets (5-10 mg total) by mouth every 6 (six) hours as needed for severe pain. 30 tablet 0  . pantoprazole (PROTONIX) 40 MG tablet Take 40 mg by mouth daily.     Marland Kitchen PEMEtrexed Disodium (ALIMTA IV) Inject into the vein. Every 3 weeks    . polyethylene glycol (MIRALAX / GLYCOLAX) packet Take 17 g by mouth daily.    . prochlorperazine (COMPAZINE) 10 MG tablet Take 1 tablet (10 mg total) by mouth every 6 (six) hours as needed for nausea or vomiting. 30 tablet 2  . psyllium (METAMUCIL) 58.6 % powder Take 1 packet by mouth daily.    . simvastatin (ZOCOR) 80 MG tablet Take 40 mg by mouth at bedtime.     Marland Kitchen terazosin (HYTRIN) 5 MG capsule Take 5 mg by mouth at bedtime.    Marland Kitchen tiZANidine (ZANAFLEX) 4 MG tablet Take 4 mg by mouth at bedtime.     No current facility-administered medications on file prior to visit.      PAST SURGICAL  HISTORY Past Surgical History:  Procedure Laterality Date  . BACK SURGERY    . CHOLECYSTECTOMY    . COLONOSCOPY N/A 11/24/2015   Procedure: COLONOSCOPY;  Surgeon: Rogene Houston, MD;  Location: [facility];  Service: Endoscopy;  Laterality: N/A;  730  . COLONOSCOPY W/ POLYPECTOMY     x 5  . CYST EXCISION     Scrotum  . EYE SURGERY Bilateral    Cataract with Lens  . HERNIA REPAIR Right    Inguinal  . IR GENERIC HISTORICAL  02/09/2016   IR US GUIDE VASC ACCESS RIGHT 02/09/2016 Arne Cleveland, MD [facility]  . IR GENERIC HISTORICAL  02/09/2016   IR FLUORO GUIDE CV LINE RIGHT 02/09/2016 Arne Cleveland, MD [facility]  . LUMBAR DISC SURGERY     per patient report  . VIDEO ASSISTED THORACOSCOPY (VATS)/ LOBECTOMY Right 12/30/2015   Procedure: VIDEO ASSISTED THORACOSCOPY (VATS)/RIGHT MIDDLE LOBECTOMY;  Surgeon: Melrose Nakayama, MD;  Location: [facility];  Service: Thoracic;  Laterality: Right;    FAMILY HISTORY: Family History  Problem Relation Age of Onset  . Colon cancer Brother   Mother died at the age of 43 secondary to complications associated with childbirth. Fatjer passed in his 24's from a possible stroke versus blood clot. He has 1 brother who is 43 years old He has 1 sister who is 36 years old He has another borther who is 80 years old.   His youngest brother is 66 years old.  1 brother is deceased at the age of 21 secondary to colon cancer.  SOCIAL HISTORY: He is an ex-smoker, quitting about 15 years ago after smoking 0.5ppd x 50 years.  He does not drink EtOH, but he admits that he used to drink EtOH.  He denies any illicit drug abuse.  He denies any religious affiliation.  He is a retired [employer] features editor.  Social History   Social History  .  Marital status: Married    Spouse name: N/A  . Number of children: N/A  . Years of education: N/A   Social History Main Topics  . Smoking status: Former Smoker    Types: Cigarettes    Quit date: 05/27/2004  .  Smokeless tobacco: Never Used  . Alcohol use No  . Drug use: No  . Sexual activity: Not Asked   Other Topics Concern  . None   Social History Narrative  . None    PERFORMANCE STATUS: The patient's performance status is 1 - Symptomatic but completely ambulatory  PHYSICAL EXAM: Most Recent Vital Signs: Blood pressure (!) 126/48, pulse 70, temperature 98.1 F (36.7 C), temperature source Oral, resp. rate 18, weight 203 lb 6.4 oz (92.3 kg), SpO2 97 %. BP (!) 126/48 (BP Location: Left Arm, Patient Position: Sitting)   Pulse 70   Temp 98.1 F (36.7 C) (Oral)   Resp 18   Wt 203 lb 6.4 oz (92.3 kg)   SpO2 97%   BMI 30.04 kg/m   General Appearance:    Alert, cooperative, no distress, appears stated age, accompanied by his wife.  Head:    Normocephalic, without obvious abnormality, atraumatic  Eyes:    Conjunctiva/corneas clear, EOM's intact       Ears:    External anatomy is normal.  Nose:   Nares normal, mucosa normal, no drainage or sinus tenderness  Throat:   Lips, mucosa, and tongue normal  Neck:   Supple, symmetrical, trachea midline, no adenopathy;       thyroid:  No enlargement/tenderness/nodules  Back:     Symmetric, no curvature, ROM normal, no CVA tenderness  Lungs:     Clear to auscultation bilaterally, respirations unlabored  Chest wall:    No tenderness or deformity Several incision sites all well healed. Steri strips in place. No edema noted.  Heart:    Regular rate and rhythm, S1 and S2 normal, no murmur, rub   or gallop  Abdomen:     Soft, non-tender, bowel sounds active all four quadrants,    no masses, no organomegaly        Extremities:   Extremities normal, atraumatic, no cyanosis or edema  Pulses:   2+ and symmetric all extremities  Skin:   Skin color, texture, turgor normal, no rashes or lesions  Lymph nodes:   Cervical, supraclavicular, and axillary nodes normal  Neurologic:   CNII-XII intact. Normal strength, sensation and reflexes      throughout     LABORATORY DATA:  I have reviewed the data as listed. CBC    Component Value Date/Time   WBC 12.1 (H) 03/29/2016 0956   RBC 3.69 (L) 03/29/2016 0956   HGB 10.8 (L) 03/29/2016 0956   HCT 31.8 (L) 03/29/2016 0956   PLT 319 03/29/2016 0956   MCV 86.2 03/29/2016 0956   MCH 29.3 03/29/2016 0956   MCHC 34.0 03/29/2016 0956   RDW 13.5 03/29/2016 0956   LYMPHSABS 1.4 03/29/2016 0956   MONOABS 0.9 03/29/2016 0956   EOSABS 0.0 03/29/2016 0956   BASOSABS 0.0 03/29/2016 0956      Chemistry      Component Value Date/Time   NA 131 (L) 03/29/2016 0956   K 3.7 03/29/2016 0956   CL 100 (L) 03/29/2016 0956   CO2 24 03/29/2016 0956   BUN 17 03/29/2016 0956   CREATININE 0.76 03/29/2016 0956   CREATININE 0.95 11/03/2015 1452      Component Value Date/Time   CALCIUM 8.5 (  L) 03/29/2016 0956   ALKPHOS 65 03/29/2016 0956   AST 27 03/29/2016 0956   ALT 24 03/29/2016 0956   BILITOT 0.5 03/29/2016 0956     Lab Results  Component Value Date   IRON 71 12/07/2015   TIBC 302 12/07/2015   FERRITIN 40 12/07/2015   Lab Results  Component Value Date   VITAMINB12 170 (L) 12/07/2015   Lab Results  Component Value Date   FOLATE 39.9 12/07/2015     RADIOGRAPHY: I have personally reviewed the radiological images as listed and agreed with the findings in the report.  Study Result   CLINICAL DATA:  Right middle lobectomy.  EXAM: CHEST  2 VIEW  COMPARISON:  01/03/2016.  FINDINGS: Mediastinum hilar structures normal. Cardiomegaly with normal pulmonary vascularity. Postsurgical changes right lung with right base atelectasis and or scarring. No pneumothorax. No acute bony abnormality.  IMPRESSION: 1. Postsurgical changes right lung. Right base atelectasis and or scarring. No pneumothorax.  2.  No acute cardiopulmonary disease.  Stable cardiomegaly .   Electronically Signed   By: Marcello Moores  Register   On: 01/20/2016 10:11     PATHOLOGY:     Diagnosis Colon,  polyp(s), ascending - TUBULAR ADENOMA(S). - HIGH GRADE DYSPLASIA IS NOT IDENTIFIED. Casimer Lanius MD Pathologist, Electronic Signature (Case signed 11/25/2015)   ASSESSMENT/PLAN:  RML nodule Stage IIIA NSCLC, T1a,N2,M0 Post op Afib RM lobectomy, mediastinal LN dissection  He is currently on Carboplatin/Alimta and has done well. No indication for XRT concurrently given no bulky N2 disease. Patient was seen by Dr. Roxan Hockey on the 17th and discharged.   He is eating well. Weight is overall stable. I have encouraged him to continue with boost/ensure once daily.  B12 deficiency B12 deficiency requiring IM B12 replacement. Continue ongoing replacement.  He has noticed a significant improvement in his neuropathy.   Antibody testing for pernicious anemia negative.   Anemia  Most likely chemotherapy induced. Will folllow. If further progression will do additional anemia evaluation.   Cough  Improved. Will continue to monitor.   Follow-up with patient in 3 weeks prior to cycle #4 of therapy.   Orders Placed This Encounter  Procedures  . CBC with Differential    Standing Status:   Future    Standing Expiration Date:   03/29/2017  . Comprehensive metabolic panel    Standing Status:   Future    Standing Expiration Date:   03/29/2017    All questions were answered. The patient knows to call the clinic with any problems, questions or concerns. We can certainly see the patient much sooner if necessary.  This document serves as a record of services personally performed by Ancil Linsey, MD. It was created on her behalf by Elmyra Ricks, a trained medical scribe. The creation of this record is based on the scribe's personal observations and the provider's statements to them. This document has been checked and approved by the attending provider.  I have reviewed the above documentation for accuracy and completeness, and I agree with the above.  This note is electronically signed  [id number] Cyril Mourning, MD  03/30/2016 6:36 PM

## 2016-03-29 ENCOUNTER — Ambulatory Visit (HOSPITAL_COMMUNITY): Payer: Medicare Other

## 2016-03-29 ENCOUNTER — Encounter (HOSPITAL_BASED_OUTPATIENT_CLINIC_OR_DEPARTMENT_OTHER): Payer: Medicare Other

## 2016-03-29 ENCOUNTER — Encounter (HOSPITAL_COMMUNITY): Payer: Medicare Other | Attending: Hematology & Oncology | Admitting: Hematology & Oncology

## 2016-03-29 ENCOUNTER — Encounter (HOSPITAL_COMMUNITY): Payer: Self-pay | Admitting: Hematology & Oncology

## 2016-03-29 VITALS — BP 145/61 | HR 65 | Temp 97.3°F | Resp 18

## 2016-03-29 VITALS — BP 126/48 | HR 70 | Temp 98.1°F | Resp 18 | Wt 203.4 lb

## 2016-03-29 DIAGNOSIS — E538 Deficiency of other specified B group vitamins: Secondary | ICD-10-CM

## 2016-03-29 DIAGNOSIS — D649 Anemia, unspecified: Secondary | ICD-10-CM | POA: Insufficient documentation

## 2016-03-29 DIAGNOSIS — T451X5A Adverse effect of antineoplastic and immunosuppressive drugs, initial encounter: Secondary | ICD-10-CM

## 2016-03-29 DIAGNOSIS — R918 Other nonspecific abnormal finding of lung field: Secondary | ICD-10-CM | POA: Insufficient documentation

## 2016-03-29 DIAGNOSIS — R05 Cough: Secondary | ICD-10-CM | POA: Diagnosis not present

## 2016-03-29 DIAGNOSIS — C3491 Malignant neoplasm of unspecified part of right bronchus or lung: Secondary | ICD-10-CM

## 2016-03-29 DIAGNOSIS — C342 Malignant neoplasm of middle lobe, bronchus or lung: Secondary | ICD-10-CM | POA: Diagnosis present

## 2016-03-29 DIAGNOSIS — Z5111 Encounter for antineoplastic chemotherapy: Secondary | ICD-10-CM | POA: Diagnosis present

## 2016-03-29 DIAGNOSIS — D6481 Anemia due to antineoplastic chemotherapy: Secondary | ICD-10-CM

## 2016-03-29 DIAGNOSIS — R059 Cough, unspecified: Secondary | ICD-10-CM

## 2016-03-29 LAB — CBC WITH DIFFERENTIAL/PLATELET
Basophils Absolute: 0 10*3/uL (ref 0.0–0.1)
Basophils Relative: 0 %
Eosinophils Absolute: 0 10*3/uL (ref 0.0–0.7)
Eosinophils Relative: 0 %
HCT: 31.8 % — ABNORMAL LOW (ref 39.0–52.0)
Hemoglobin: 10.8 g/dL — ABNORMAL LOW (ref 13.0–17.0)
Lymphocytes Relative: 12 %
Lymphs Abs: 1.4 10*3/uL (ref 0.7–4.0)
MCH: 29.3 pg (ref 26.0–34.0)
MCHC: 34 g/dL (ref 30.0–36.0)
MCV: 86.2 fL (ref 78.0–100.0)
Monocytes Absolute: 0.9 10*3/uL (ref 0.1–1.0)
Monocytes Relative: 7 %
Neutro Abs: 9.8 10*3/uL — ABNORMAL HIGH (ref 1.7–7.7)
Neutrophils Relative %: 81 %
Platelets: 319 10*3/uL (ref 150–400)
RBC: 3.69 MIL/uL — ABNORMAL LOW (ref 4.22–5.81)
RDW: 13.5 % (ref 11.5–15.5)
WBC: 12.1 10*3/uL — ABNORMAL HIGH (ref 4.0–10.5)

## 2016-03-29 LAB — COMPREHENSIVE METABOLIC PANEL
ALT: 24 U/L (ref 17–63)
AST: 27 U/L (ref 15–41)
Albumin: 3.4 g/dL — ABNORMAL LOW (ref 3.5–5.0)
Alkaline Phosphatase: 65 U/L (ref 38–126)
Anion gap: 7 (ref 5–15)
BUN: 17 mg/dL (ref 6–20)
CO2: 24 mmol/L (ref 22–32)
Calcium: 8.5 mg/dL — ABNORMAL LOW (ref 8.9–10.3)
Chloride: 100 mmol/L — ABNORMAL LOW (ref 101–111)
Creatinine, Ser: 0.76 mg/dL (ref 0.61–1.24)
GFR calc Af Amer: >60 mL/min (ref >60)
GFR calc non Af Amer: >60 mL/min (ref >60)
Glucose, Bld: 174 mg/dL — ABNORMAL HIGH (ref 65–99)
Potassium: 3.7 mmol/L (ref 3.5–5.1)
Sodium: 131 mmol/L — ABNORMAL LOW (ref 135–145)
Total Bilirubin: 0.5 mg/dL (ref 0.3–1.2)
Total Protein: 6.3 g/dL — ABNORMAL LOW (ref 6.5–8.1)

## 2016-03-29 MED ORDER — HEPARIN SOD (PORK) LOCK FLUSH 100 UNIT/ML IV SOLN
INTRAVENOUS | Status: AC
  Filled 2016-03-29: qty 5

## 2016-03-29 MED ORDER — HEPARIN SOD (PORK) LOCK FLUSH 100 UNIT/ML IV SOLN
500.0000 [IU] | Freq: Once | INTRAVENOUS | Status: AC | PRN
  Administered 2016-03-29: 500 [IU]

## 2016-03-29 MED ORDER — DEXAMETHASONE SODIUM PHOSPHATE 10 MG/ML IJ SOLN
10.0000 mg | Freq: Once | INTRAMUSCULAR | Status: AC
  Administered 2016-03-29: 10 mg via INTRAVENOUS
  Filled 2016-03-29: qty 1

## 2016-03-29 MED ORDER — SODIUM CHLORIDE 0.9 % IV SOLN
Freq: Once | INTRAVENOUS | Status: AC
  Administered 2016-03-29: 11:00:00 via INTRAVENOUS

## 2016-03-29 MED ORDER — PALONOSETRON HCL INJECTION 0.25 MG/5ML
0.2500 mg | Freq: Once | INTRAVENOUS | Status: AC
  Administered 2016-03-29: 0.25 mg via INTRAVENOUS
  Filled 2016-03-29: qty 5

## 2016-03-29 MED ORDER — SODIUM CHLORIDE 0.9 % IV SOLN
484.0000 mg | Freq: Once | INTRAVENOUS | Status: AC
  Administered 2016-03-29: 480 mg via INTRAVENOUS
  Filled 2016-03-29: qty 48

## 2016-03-29 MED ORDER — SODIUM CHLORIDE 0.9 % IV SOLN
520.0000 mg/m2 | Freq: Once | INTRAVENOUS | Status: AC
  Administered 2016-03-29: 1100 mg via INTRAVENOUS
  Filled 2016-03-29: qty 20

## 2016-03-29 MED ORDER — SODIUM CHLORIDE 0.9 % IV SOLN: 10.0000 mg | Freq: Once | INTRAVENOUS | Status: DC

## 2016-03-29 MED ORDER — CYANOCOBALAMIN 1000 MCG/ML IJ SOLN
1000.0000 ug | Freq: Once | INTRAMUSCULAR | Status: DC
Start: 2016-03-29 — End: 2016-03-29

## 2016-03-29 MED ORDER — SODIUM CHLORIDE 0.9% FLUSH
10.0000 mL | INTRAVENOUS | Status: DC | PRN
  Administered 2016-03-29: 10 mL
  Filled 2016-03-29: qty 10

## 2016-03-29 NOTE — Progress Notes (Signed)
[name] tolerated chemo tx well without complaints or incident. Labs reviewed prior to administering chemo. VSS upon discharge. Pt discharged self ambulatory using walker in satisfactory condition with wife

## 2016-03-29 NOTE — Treatment Plan (Signed)
Got B 12 inj 09/28. It should be every 9 weeks per label. Ok with sooner, but not sure about 21 days will speak with MD

## 2016-03-29 NOTE — Patient Instructions (Addendum)
[place] at [facility] Discharge Instructions  RECOMMENDATIONS MADE BY THE CONSULTANT AND ANY TEST RESULTS WILL BE SENT TO YOUR REFERRING PHYSICIAN.  You saw Dr. Whitney Muse today. Follow up with MD at next chemo appt with labs.  Thank you for choosing [place] at [facility] to provide your oncology and hematology care.  To afford each patient quality time with our provider, please arrive at least 15 minutes before your scheduled appointment time.   Beginning January 23rd 2017 lab work for the [facility] will be done in the  Main lab at [facility] on 1st floor. If you have a lab appointment with the [facility] please come in thru the  Main Entrance and check in at the main information desk  You need to re-schedule your appointment should you arrive 10 or more minutes late.  We strive to give you quality time with our providers, and arriving late affects you and other patients whose appointments are after yours.  Also, if you no show three or more times for appointments you may be dismissed from the clinic at the providers discretion.     Again, thank you for choosing [facility].  Our hope is that these requests will decrease the amount of time that you wait before being seen by our physicians.       _____________________________________________________________  Should you have questions after your visit to [facility], please contact our office at [phone] between the hours of 8:30 a.m. and 4:30 p.m.  Voicemails left after 4:30 p.m. will not be returned until the following business day.  For prescription refill requests, have your pharmacy contact our office.         Resources For Cancer Patients and their Caregivers ? American Cancer Society: Can assist with transportation, wigs, general needs, runs Look Good Feel Better.        601-703-1039 ? Cancer Care: Provides financial assistance, online  support groups, medication/co-pay assistance.  1-800-813-HOPE (757)424-1020) ? [facility] Assists [place] cancer patients and their families through emotional , educational and financial support.  321-816-6348 ? [place] DSS Where to apply for food stamps, Medicaid and utility assistance. 579-098-4916 ? RCATS: Transportation to medical appointments. (406)234-7900 ? Social Security Administration: May apply for disability if have a Stage IV cancer. 407-842-6260 450-224-4222 ? [employer], Disability and Transit Services: Assists with nutrition, care and transit needs. [place] Support Programs: '@10RELATIVEDAYS'$ @ > Cancer Support Group  2nd Tuesday of the month 1pm-2pm, Journey Room  > Creative Journey  3rd Tuesday of the month 1130am-1pm, Journey Room  > Look Good Feel Better  1st Wednesday of the month 10am-12 noon, Journey Room (Call [place] to register [phone])

## 2016-03-29 NOTE — Patient Instructions (Signed)
[facility] Discharge Instructions for Patients Receiving Chemotherapy   Beginning January 23rd 2017 lab work for the Cancer Center will be done in the  Main lab at [facility] on 1st floor. If you have a lab appointment with the Cancer Center please come in thru the  Main Entrance and check in at the main information desk   Today you received the following chemotherapy agents Alimta and Carboplatin. Follow-up as scheduled. Call clinic for any questions or concerns  To help prevent nausea and vomiting after your treatment, we encourage you to take your nausea medication   If you develop nausea and vomiting, or diarrhea that is not controlled by your medication, call the clinic.  The clinic phone number is [phone]. Office hours are Monday-Friday 8:30am-5:00pm.  BELOW ARE SYMPTOMS THAT SHOULD BE REPORTED IMMEDIATELY:  *FEVER GREATER THAN 101.0 F  *CHILLS WITH OR WITHOUT FEVER  NAUSEA AND VOMITING THAT IS NOT CONTROLLED WITH YOUR NAUSEA MEDICATION  *UNUSUAL SHORTNESS OF BREATH  *UNUSUAL BRUISING OR BLEEDING  TENDERNESS IN MOUTH AND THROAT WITH OR WITHOUT PRESENCE OF ULCERS  *URINARY PROBLEMS  *BOWEL PROBLEMS  UNUSUAL RASH Items with * indicate a potential emergency and should be followed up as soon as possible. If you have an emergency after office hours please contact your primary care physician or go to the nearest emergency department.  Please call the clinic during office hours if you have any questions or concerns.   You may also contact the Patient Navigator at [phone] should you have any questions or need assistance in obtaining follow up care.      Resources For Cancer Patients and their Caregivers ? American Cancer Society: Can assist with transportation, wigs, general needs, runs Look Good Feel Better.        1-888-227-6333 ? Cancer Care: Provides financial assistance, online support groups, medication/co-pay assistance.   1-800-813-HOPE (4673) ? [facility] Assists [place] cancer patients and their families through emotional , educational and financial support.  336-427-4357 ? [place] DSS Where to apply for food stamps, Medicaid and utility assistance. 336-342-1394 ? RCATS: Transportation to medical appointments. 336-347-2287 ? Social Security Administration: May apply for disability if have a Stage IV cancer. 336-342-7796 1-800-772-1213 ? [place] Aging, Disability and Transit Services: Assists with nutrition, care and transit needs. [phone]         

## 2016-03-30 ENCOUNTER — Encounter (HOSPITAL_COMMUNITY): Payer: Self-pay | Admitting: Hematology & Oncology

## 2016-04-19 ENCOUNTER — Ambulatory Visit (HOSPITAL_COMMUNITY): Payer: Medicare Other

## 2016-04-19 ENCOUNTER — Encounter (HOSPITAL_COMMUNITY): Payer: Medicare Other | Attending: Oncology | Admitting: Oncology

## 2016-04-19 ENCOUNTER — Encounter (HOSPITAL_COMMUNITY): Payer: Medicare Other | Attending: Hematology & Oncology

## 2016-04-19 ENCOUNTER — Encounter (HOSPITAL_COMMUNITY): Payer: Self-pay | Admitting: Oncology

## 2016-04-19 VITALS — BP 143/78 | HR 90 | Temp 97.6°F | Resp 18

## 2016-04-19 VITALS — BP 139/73 | HR 97 | Temp 97.7°F | Resp 16 | Ht 70.0 in | Wt 202.0 lb

## 2016-04-19 DIAGNOSIS — C342 Malignant neoplasm of middle lobe, bronchus or lung: Secondary | ICD-10-CM | POA: Diagnosis present

## 2016-04-19 DIAGNOSIS — E538 Deficiency of other specified B group vitamins: Secondary | ICD-10-CM | POA: Diagnosis not present

## 2016-04-19 DIAGNOSIS — Z5111 Encounter for antineoplastic chemotherapy: Secondary | ICD-10-CM | POA: Diagnosis present

## 2016-04-19 DIAGNOSIS — C3491 Malignant neoplasm of unspecified part of right bronchus or lung: Secondary | ICD-10-CM

## 2016-04-19 DIAGNOSIS — E785 Hyperlipidemia, unspecified: Secondary | ICD-10-CM | POA: Insufficient documentation

## 2016-04-19 DIAGNOSIS — E119 Type 2 diabetes mellitus without complications: Secondary | ICD-10-CM | POA: Insufficient documentation

## 2016-04-19 DIAGNOSIS — K219 Gastro-esophageal reflux disease without esophagitis: Secondary | ICD-10-CM | POA: Diagnosis not present

## 2016-04-19 DIAGNOSIS — Z87891 Personal history of nicotine dependence: Secondary | ICD-10-CM | POA: Diagnosis not present

## 2016-04-19 DIAGNOSIS — D649 Anemia, unspecified: Secondary | ICD-10-CM | POA: Insufficient documentation

## 2016-04-19 DIAGNOSIS — I4891 Unspecified atrial fibrillation: Secondary | ICD-10-CM | POA: Diagnosis not present

## 2016-04-19 DIAGNOSIS — M1990 Unspecified osteoarthritis, unspecified site: Secondary | ICD-10-CM | POA: Insufficient documentation

## 2016-04-19 DIAGNOSIS — Z9889 Other specified postprocedural states: Secondary | ICD-10-CM | POA: Insufficient documentation

## 2016-04-19 DIAGNOSIS — R739 Hyperglycemia, unspecified: Secondary | ICD-10-CM

## 2016-04-19 LAB — CBC WITH DIFFERENTIAL/PLATELET
Basophils Absolute: 0 10*3/uL (ref 0.0–0.1)
Basophils Relative: 0 %
Eosinophils Absolute: 0 10*3/uL (ref 0.0–0.7)
Eosinophils Relative: 0 %
HCT: 34.4 % — ABNORMAL LOW (ref 39.0–52.0)
Hemoglobin: 11.6 g/dL — ABNORMAL LOW (ref 13.0–17.0)
Lymphocytes Relative: 11 %
Lymphs Abs: 1.5 10*3/uL (ref 0.7–4.0)
MCH: 29.2 pg (ref 26.0–34.0)
MCHC: 33.7 g/dL (ref 30.0–36.0)
MCV: 86.6 fL (ref 78.0–100.0)
Monocytes Absolute: 0.8 10*3/uL (ref 0.1–1.0)
Monocytes Relative: 6 %
Neutro Abs: 11 10*3/uL — ABNORMAL HIGH (ref 1.7–7.7)
Neutrophils Relative %: 83 %
Platelets: 310 10*3/uL (ref 150–400)
RBC: 3.97 MIL/uL — ABNORMAL LOW (ref 4.22–5.81)
RDW: 13.9 % (ref 11.5–15.5)
WBC: 13.3 10*3/uL — ABNORMAL HIGH (ref 4.0–10.5)

## 2016-04-19 LAB — COMPREHENSIVE METABOLIC PANEL
ALT: 25 U/L (ref 17–63)
AST: 34 U/L (ref 15–41)
Albumin: 3.6 g/dL (ref 3.5–5.0)
Alkaline Phosphatase: 69 U/L (ref 38–126)
Anion gap: 11 (ref 5–15)
BUN: 20 mg/dL (ref 6–20)
CO2: 21 mmol/L — ABNORMAL LOW (ref 22–32)
Calcium: 8.8 mg/dL — ABNORMAL LOW (ref 8.9–10.3)
Chloride: 101 mmol/L (ref 101–111)
Creatinine, Ser: 1.13 mg/dL (ref 0.61–1.24)
GFR calc Af Amer: >60 mL/min (ref >60)
GFR calc non Af Amer: 58 mL/min — ABNORMAL LOW (ref >60)
Glucose, Bld: 353 mg/dL — ABNORMAL HIGH (ref 65–99)
Potassium: 3.6 mmol/L (ref 3.5–5.1)
Sodium: 133 mmol/L — ABNORMAL LOW (ref 135–145)
Total Bilirubin: 0.5 mg/dL (ref 0.3–1.2)
Total Protein: 6.7 g/dL (ref 6.5–8.1)

## 2016-04-19 MED ORDER — HEPARIN SOD (PORK) LOCK FLUSH 100 UNIT/ML IV SOLN
500.0000 [IU] | Freq: Once | INTRAVENOUS | Status: AC | PRN
  Administered 2016-04-19: 500 [IU]
  Filled 2016-04-19: qty 5

## 2016-04-19 MED ORDER — SODIUM CHLORIDE 0.9 % IV SOLN
1100.0000 mg | Freq: Once | INTRAVENOUS | Status: AC
  Administered 2016-04-19: 1100 mg via INTRAVENOUS
  Filled 2016-04-19: qty 40

## 2016-04-19 MED ORDER — PALONOSETRON HCL INJECTION 0.25 MG/5ML
0.2500 mg | Freq: Once | INTRAVENOUS | Status: AC
  Administered 2016-04-19: 0.25 mg via INTRAVENOUS
  Filled 2016-04-19: qty 5

## 2016-04-19 MED ORDER — SODIUM CHLORIDE 0.9 % IV SOLN
Freq: Once | INTRAVENOUS | Status: AC
  Administered 2016-04-19: 12:00:00 via INTRAVENOUS

## 2016-04-19 MED ORDER — CALCIUM CARBONATE 1250 (500 CA) MG PO TABS: 1.0000 | ORAL_TABLET | Freq: Every day | ORAL | 2 refills | Status: DC

## 2016-04-19 MED ORDER — DEXAMETHASONE SODIUM PHOSPHATE 10 MG/ML IJ SOLN
10.0000 mg | Freq: Once | INTRAMUSCULAR | Status: AC
  Administered 2016-04-19: 10 mg via INTRAVENOUS
  Filled 2016-04-19: qty 1

## 2016-04-19 MED ORDER — SODIUM CHLORIDE 0.9 % IV SOLN: 10.0000 mg | Freq: Once | INTRAVENOUS | Status: DC

## 2016-04-19 MED ORDER — SODIUM CHLORIDE 0.9% FLUSH
10.0000 mL | INTRAVENOUS | Status: DC | PRN
  Administered 2016-04-19: 10 mL
  Filled 2016-04-19: qty 10

## 2016-04-19 MED ORDER — SODIUM CHLORIDE 0.9 % IV SOLN
442.5000 mg | Freq: Once | INTRAVENOUS | Status: AC
  Administered 2016-04-19: 440 mg via INTRAVENOUS
  Filled 2016-04-19: qty 44

## 2016-04-19 MED ORDER — INSULIN ASPART 100 UNIT/ML ~~LOC~~ SOLN
6.0000 [IU] | Freq: Once | SUBCUTANEOUS | Status: DC
  Filled 2016-04-19: qty 0.06

## 2016-04-19 MED ORDER — INSULIN ASPART 100 UNIT/ML ~~LOC~~ SOLN
6.0000 [IU] | Freq: Once | SUBCUTANEOUS | Status: AC
  Administered 2016-04-19: 6 [IU] via SUBCUTANEOUS
  Filled 2016-04-19: qty 0.06

## 2016-04-19 NOTE — Progress Notes (Signed)
[name] Public tolerated chemo tx well without complaints or incident. Labs reviewed with [name] PA including glucose of 353. Pt received Novolog 6 units per MD order. VSS upon discharge. Pt discharged self ambulatory using walker in satisfactory condition with wife

## 2016-04-19 NOTE — Patient Instructions (Addendum)
[place] at [facility] Discharge Instructions  RECOMMENDATIONS MADE BY THE CONSULTANT AND ANY TEST RESULTS WILL BE SENT TO YOUR REFERRING PHYSICIAN.  You were seen today by Gary Crigler PA-C. Return in 3 weeks for labs, chemo and follow up.    Thank you for choosing [place] at [facility] to provide your oncology and hematology care.  To afford each patient quality time with our provider, please arrive at least 15 minutes before your scheduled appointment time.   Beginning January 23rd 2017 lab work for the [facility] will be done in the  Main lab at [facility] on 1st floor. If you have a lab appointment with the [facility] please come in thru the  Main Entrance and check in at the main information desk  You need to re-schedule your appointment should you arrive 10 or more minutes late.  We strive to give you quality time with our providers, and arriving late affects you and other patients whose appointments are after yours.  Also, if you no show three or more times for appointments you may be dismissed from the clinic at the providers discretion.     Again, thank you for choosing [facility].  Our hope is that these requests will decrease the amount of time that you wait before being seen by our physicians.       _____________________________________________________________  Should you have questions after your visit to [facility], please contact our office at [phone] between the hours of 8:30 a.m. and 4:30 p.m.  Voicemails left after 4:30 p.m. will not be returned until the following business day.  For prescription refill requests, have your pharmacy contact our office.         Resources For Cancer Patients and their Caregivers ? American Cancer Society: Can assist with transportation, wigs, general needs, runs Look Good Feel Better.        (445)658-6283 ? Cancer Care: Provides financial  assistance, online support groups, medication/co-pay assistance.  1-800-813-HOPE (619) 492-8282) ? [facility] Assists [place] cancer patients and their families through emotional , educational and financial support.  325-298-0170 ? [place] DSS Where to apply for food stamps, Medicaid and utility assistance. (330)804-0123 ? RCATS: Transportation to medical appointments. 918-390-0657 ? Social Security Administration: May apply for disability if have a Stage IV cancer. 8073703562 2012031060 ? [employer], Disability and Transit Services: Assists with nutrition, care and transit needs. [place] Support Programs: '@10RELATIVEDAYS'$ @ > Cancer Support Group  2nd Tuesday of the month 1pm-2pm, Journey Room  > Creative Journey  3rd Tuesday of the month 1130am-1pm, Journey Room  > Look Good Feel Better  1st Wednesday of the month 10am-12 noon, Journey Room (Call [place] to register [phone])

## 2016-04-19 NOTE — Assessment & Plan Note (Addendum)
Stage IIIA ([id number]) adenocarcinoma of right lung, S/P R VATS, RML lobectomy, and mediastinal lymph node dissection by Dr. Roxan Hockey on 12/30/2015.  On adjuvant systemic chemotherapy consisting of Carboplatin/Alimta beginning on 02/16/2016.  Oncology history is up to date.  Pre-treatment labs: CBC diff, CMET.  I personally reviewed and went over laboratory results with the patient.  The results are noted within this dictation.  He is tolerating treatment well.  Weight is stable.  Continue with treatment as outlined.  Return in 3 weeks for follow-up and next cycle of chemotherapy (#5).

## 2016-04-19 NOTE — Progress Notes (Signed)
[facility], [place] [address]  Adenocarcinoma of right lung, stage 3 (HCC)  Anemia, unspecified type - Plan: Vitamin B12, Folate, Iron and TIBC, Ferritin, Haptoglobin, Lactate dehydrogenase, Reticulocytes, Erythropoietin  CURRENT THERAPY: Carboplatin/Alimta beginning on 02/16/2016  INTERVAL HISTORY: [name] 82 y.o. male returns for followup of Stage IIIA (T1AN2M0) adenocarcinoma of right lung, S/P R VATS, RML lobectomy, and mediastinal lymph node dissection by Dr. Roxan Hockey on 12/30/2015.    Adenocarcinoma of right lung, stage 3 (HCC)   11/10/2015 Imaging    13 mm spiculated lesion seen in right middle lobe.      11/30/2015 PET scan    Spiculated right middle lobe nodule is mildly hypermetabolic and most consistent with a stage IA adenocarcinoma.      12/30/2015 Surgery    Right middle lobectomy and node dissection      12/30/2015 Procedure    Right video-assisted thoracoscopy, Thoracoscopic right middle lobectomy, Mediastinal lymph node dissection, and On-Q local anesthetic catheter placement.      01/02/2016 Pathology Results    1. Lung, resection (segmental or lobe), Right Middle Lobe - INVASIVE ADENOCARCINOMA, WELL DIFFERENTIATED, SPANNING 1.2 CM. - THE SURGICAL RESECTION MARGINS ARE NEGATIVE FOR CARCINOMA. 2. Lymph node, biopsy, Level 12 - METASTATIC CARCINOMA IN 1 OF 1 LYMPH NODE (1/1). 3. Lymph node, biopsy, Level 12 #2 - METASTATIC CARCINOMA IN 1 OF 1 LYMPH NODE (1/1). 4. Lymph node, biopsy, Level 7 - METASTATIC CARCINOMA IN 1 OF 1 LYMPH NODE (1/1/). 5. Lymph node, biopsy, Level 7 #2 - METASTATIC CARCINOMA IN 1 OF 1 LYMPH NODE (1/1). 6. Lymph node, biopsy, Level 7 #3 - METASTATIC CARCINOMA IN 1 OF 1 LYMPH NODE (1/1). 7. Lymph node, biopsy, Level 7 #4 - METASTATIC CARCINOMA IN 1 OF 1 LYMPH NODE (1/1). 8. Lymph node, biopsy, 4R - THERE IS NO EVIDENCE OF CARCINOMA IN 1 OF 1 LYMPH NODE (0/1). 9. Lymph node, biopsy, 4R #2 - THERE IS  NO EVIDENCE OF CARCINOMA IN 1 OF 1 LYMPH NODE (0/1).      01/05/2016 Pathology Results    PDL1 NEGATIVE- tumor proportion score of 0%.       01/05/2016 Pathology Results    Genomic alterations identified: BRAF V600E, KIT amplification, PDGFRA amplification, CDKN2A/B loss, TP53 S105f*33.  Additional findings: MSI-STABLE.  No reportable alterations identified: EGFR, KRAS, ALK, MET, RET, ERBB2, ROS1      02/09/2016 Procedure    Port placed by IR.      02/16/2016 -  Chemotherapy    The patient had palonosetron (ALOXI) injection 0.25 mg, 0.25 mg, Intravenous,  Once, 0 of 4 cycles  PEMEtrexed (ALIMTA) 1,050 mg in sodium chloride 0.9 % 100 mL chemo infusion, 500 mg/m2, Intravenous,  Once, 0 of 4 cycles  CARBOplatin (PARAPLATIN) in sodium chloride 0.9 % 100 mL chemo infusion, , Intravenous,  Once, 0 of 4 cycles  palonosetron (ALOXI) injection 0.25 mg, 0.25 mg, Intravenous,  Once, 1 of 6 cycles  PEMEtrexed (ALIMTA) 1,050 mg in sodium chloride 0.9 % 100 mL chemo infusion, 500 mg/m2 = 1,050 mg, Intravenous,  Once, 1 of 6 cycles  CARBOplatin (PARAPLATIN) 480 mg in sodium chloride 0.9 % 250 mL chemo infusion, 480 mg (100 % of original dose 484 mg), Intravenous,  Once, 1 of 6 cycles Dose modification:   (original dose 484 mg, Cycle 1)  for chemotherapy treatment.         He is tolerating treatment well.  He  denies any nausea or vomiting.  He notes that his appetite is strong.  His weight is stable.    He notes that his cough has nearly resolved with the help of antibiotics.  He notes a clear nasal discharge that is much improved as well.  He suspects this is secondary to allergies.  He denies any URI signs/symptoms today.  He does note a chronic low back pain. This is at baseline.  Review of Systems  Constitutional: Negative.  Negative for chills, fever and weight loss.  HENT: Negative.   Eyes: Negative.   Respiratory: Negative.  Negative for cough, sputum production and shortness of  breath.   Cardiovascular: Negative for chest pain.  Gastrointestinal: Negative.  Negative for constipation, diarrhea, nausea and vomiting.  Genitourinary: Negative.   Musculoskeletal: Positive for back pain (chronic and at baseline).  Skin: Negative.   Neurological: Negative.  Negative for weakness.  Endo/Heme/Allergies: Negative.   Psychiatric/Behavioral: Negative.     Past Medical History:  Diagnosis Date  . Adenocarcinoma of lung, stage 3 (HCC)    Stage IIIA  . Adenocarcinoma of right lung, stage 3 ([place]) 12/02/2015  . Arthritis   . Atrial fibrillation, transient ([place])    transient postop, < 24 hours  . B12 deficiency   . Cyst of scrotum   . Diverticulitis   . GERD (gastroesophageal reflux disease)   . Hernia   . History of pneumonia   . Hyperlipidemia   . Lung nodule    Spiculated right middle lobe nodule, hypermetabolic  . Type 2 diabetes mellitus ([place])     Past Surgical History:  Procedure Laterality Date  . BACK SURGERY    . CHOLECYSTECTOMY    . COLONOSCOPY N/A 11/24/2015   Procedure: COLONOSCOPY;  Surgeon: Rogene Houston, MD;  Location: [facility];  Service: Endoscopy;  Laterality: N/A;  730  . COLONOSCOPY W/ POLYPECTOMY     x 5  . CYST EXCISION     Scrotum  . EYE SURGERY Bilateral    Cataract with Lens  . HERNIA REPAIR Right    Inguinal  . IR GENERIC HISTORICAL  02/09/2016   IR US GUIDE VASC ACCESS RIGHT 02/09/2016 Arne Cleveland, MD [facility]-INTER[facility]  . IR GENERIC HISTORICAL  02/09/2016   IR FLUORO GUIDE CV LINE RIGHT 02/09/2016 Arne Cleveland, MD [facility]-INTER[facility]  . LUMBAR DISC SURGERY     per patient report  . VIDEO ASSISTED THORACOSCOPY (VATS)/ LOBECTOMY Right 12/30/2015   Procedure: VIDEO ASSISTED THORACOSCOPY (VATS)/RIGHT MIDDLE LOBECTOMY;  Surgeon: Melrose Nakayama, MD;  Location: [facility];  Service: Thoracic;  Laterality: Right;    Family History  Problem Relation Age of Onset  . Colon cancer Brother     Social History   Social History  . Marital  status: Married    Spouse name: N/A  . Number of children: N/A  . Years of education: N/A   Social History Main Topics  . Smoking status: Former Smoker    Types: Cigarettes    Quit date: 05/27/2004  . Smokeless tobacco: Never Used  . Alcohol use No  . Drug use: No  . Sexual activity: Not Asked   Other Topics Concern  . None   Social History Narrative  . None     PHYSICAL EXAMINATION  ECOG PERFORMANCE STATUS: 1 - Symptomatic but completely ambulatory  Vitals:   04/19/16 1021  BP: 139/73  Pulse: 97  Resp: 16  Temp: 97.7 F (36.5 C)    GENERAL:alert, no  distress, well nourished, well developed, comfortable, cooperative, obese, smiling and accompanied by his wife. SKIN: skin color, texture, turgor are normal, no rashes or significant lesions HEAD: Normocephalic, No masses, lesions, tenderness or abnormalities EYES: normal, EOMI, Conjunctiva are pink and non-injected EARS: External ears normal OROPHARYNX:lips, buccal mucosa, and tongue normal  NECK: supple, trachea midline LYMPH:  not examined BREAST:not examined LUNGS: clear to auscultation  HEART: regular rate & rhythm ABDOMEN:abdomen soft, non-tender, obese and normal bowel sounds BACK: Back symmetric, no curvature. EXTREMITIES:less then 2 second capillary refill, no joint deformities, effusion, or inflammation, no skin discoloration, no clubbing, no cyanosis, positive findings:  edema 1+ pitting pedal edema.  NEURO: alert & oriented x 3 with fluent speech, no focal motor/sensory deficits, gait normal   LABORATORY DATA: CBC    Component Value Date/Time   WBC 12.1 (H) 03/29/2016 0956   RBC 3.69 (L) 03/29/2016 0956   HGB 10.8 (L) 03/29/2016 0956   HCT 31.8 (L) 03/29/2016 0956   PLT 319 03/29/2016 0956   MCV 86.2 03/29/2016 0956   MCH 29.3 03/29/2016 0956   MCHC 34.0 03/29/2016 0956   RDW 13.5 03/29/2016 0956   LYMPHSABS 1.4 03/29/2016 0956   MONOABS 0.9 03/29/2016 0956   EOSABS 0.0 03/29/2016 0956    BASOSABS 0.0 03/29/2016 0956      Chemistry      Component Value Date/Time   NA 131 (L) 03/29/2016 0956   K 3.7 03/29/2016 0956   CL 100 (L) 03/29/2016 0956   CO2 24 03/29/2016 0956   BUN 17 03/29/2016 0956   CREATININE 0.76 03/29/2016 0956   CREATININE 0.95 11/03/2015 1452      Component Value Date/Time   CALCIUM 8.5 (L) 03/29/2016 0956   ALKPHOS 65 03/29/2016 0956   AST 27 03/29/2016 0956   ALT 24 03/29/2016 0956   BILITOT 0.5 03/29/2016 0956        PENDING LABS:   RADIOGRAPHIC STUDIES:  Dg Chest 2 View  Result Date: 03/27/2016 CLINICAL DATA:  Right sided lung cancer. EXAM: CHEST  2 VIEW COMPARISON:  01/20/2016 FINDINGS: There is a right chest wall port a catheter with tip in the projection of the cavoatrial junction. No pleural effusion or edema. Postoperative appearance of the right lung with volume loss and asymmetric elevation of the right hemidiaphragm. There is spondylosis throughout the thoracic spine. IMPRESSION: 1. No acute cardiopulmonary abnormalities. 2. Stable postoperative appearance of the right lung. Electronically Signed   By: Signa Kell M.D.   On: 03/27/2016 10:11     PATHOLOGY:    ASSESSMENT AND PLAN:  Adenocarcinoma of right lung, stage 3 (HCC) Stage IIIA ([id number]) adenocarcinoma of right lung, S/P R VATS, RML lobectomy, and mediastinal lymph node dissection by Dr. Dorris Fetch on 12/30/2015.  On adjuvant systemic chemotherapy consisting of Carboplatin/Alimta beginning on 02/16/2016.  Oncology history is up to date.  Pre-treatment labs: CBC diff, CMET.  I personally reviewed and went over laboratory results with the patient.  The results are noted within this dictation.  He is tolerating treatment well.  Weight is stable.  Continue with treatment as outlined.  Return in 3 weeks for follow-up and next cycle of chemotherapy (#5).    ORDERS PLACED FOR THIS ENCOUNTER: Orders Placed This Encounter  Procedures  . Vitamin B12  . Folate  .  Iron and TIBC  . Ferritin  . Haptoglobin  . Lactate dehydrogenase  . Reticulocytes  . Erythropoietin    MEDICATIONS PRESCRIBED THIS ENCOUNTER: No orders of the  defined types were placed in this encounter.   THERAPY PLAN:  Continue with treatment as outlined.  He will complete 6 cycles of chemotherapy followed by restaging imaging.   All questions were answered. The patient knows to call the clinic with any problems, questions or concerns. We can certainly see the patient much sooner if necessary.  Patient and plan discussed with Dr. Ancil Linsey and she is in agreement with the aforementioned.   This note is electronically signed by: Doy Mince 04/19/2016 10:37 AM

## 2016-04-19 NOTE — Patient Instructions (Signed)
[facility] Discharge Instructions for Patients Receiving Chemotherapy   Beginning January 23rd 2017 lab work for the [facility] will be done in the  Main lab at [facility] on 1st floor. If you have a lab appointment with the [place] [facility] please come in thru the  Main Entrance and check in at the main information desk   Today you received the following chemotherapy agents Alimta and Carboplatin. Follow-up as scheduled. Call clinic for any questions or concerns  To help prevent nausea and vomiting after your treatment, we encourage you to take your nausea medication   If you develop nausea and vomiting, or diarrhea that is not controlled by your medication, call the clinic.  The clinic phone number is [phone]. Office hours are Monday-Friday 8:30am-5:00pm.  BELOW ARE SYMPTOMS THAT SHOULD BE REPORTED IMMEDIATELY:  *FEVER GREATER THAN 101.0 F  *CHILLS WITH OR WITHOUT FEVER  NAUSEA AND VOMITING THAT IS NOT CONTROLLED WITH YOUR NAUSEA MEDICATION  *UNUSUAL SHORTNESS OF BREATH  *UNUSUAL BRUISING OR BLEEDING  TENDERNESS IN MOUTH AND THROAT WITH OR WITHOUT PRESENCE OF ULCERS  *URINARY PROBLEMS  *BOWEL PROBLEMS  UNUSUAL RASH Items with * indicate a potential emergency and should be followed up as soon as possible. If you have an emergency after office hours please contact your primary care physician or go to the nearest emergency department.  Please call the clinic during office hours if you have any questions or concerns.   You may also contact the Patient Navigator at [phone] should you have any questions or need assistance in obtaining follow up care.      Resources For Cancer Patients and their Caregivers ? American Cancer Society: Can assist with transportation, wigs, general needs, runs Look Good Feel Better.        803-140-0917 ? Cancer Care: Provides financial assistance, online support groups, medication/co-pay assistance.   1-800-813-HOPE (903)480-3709) ? [facility] Assists [place] cancer patients and their families through emotional , educational and financial support.  9808225020 ? [place] DSS Where to apply for food stamps, Medicaid and utility assistance. 206-041-9176 ? RCATS: Transportation to medical appointments. 530-133-3463 ? Social Security Administration: May apply for disability if have a Stage IV cancer. 256-504-6325 [phone] ? [employer], Disability and Transit Services: Assists with nutrition, care and transit needs. [phone]

## 2016-04-19 NOTE — Addendum Note (Signed)
Addended by: [facility] on: 04/19/2016 03:22 PM   Modules accepted: Orders

## 2016-04-25 ENCOUNTER — Other Ambulatory Visit (HOSPITAL_COMMUNITY): Payer: Self-pay

## 2016-04-25 ENCOUNTER — Telehealth (HOSPITAL_COMMUNITY): Payer: Self-pay | Admitting: *Deleted

## 2016-04-25 DIAGNOSIS — R300 Dysuria: Secondary | ICD-10-CM

## 2016-04-25 NOTE — Telephone Encounter (Signed)
Returned patient's phone call , informed wife to bring patient up here to have a UA with reflex and culture. Encouraged wife to check and make sure he is cleaning himself good since he has not been circumcised.

## 2016-04-26 ENCOUNTER — Other Ambulatory Visit (HOSPITAL_COMMUNITY): Payer: Medicare Other

## 2016-04-26 ENCOUNTER — Other Ambulatory Visit (HOSPITAL_COMMUNITY): Payer: Self-pay | Admitting: *Deleted

## 2016-04-26 ENCOUNTER — Other Ambulatory Visit (HOSPITAL_COMMUNITY)
Admission: RE | Admit: 2016-04-26 | Discharge: 2016-04-26 | Disposition: A | Discharge status: Home / Self Care | Payer: Medicare Other | Source: Ambulatory Visit | Attending: Oncology | Admitting: Oncology

## 2016-04-26 DIAGNOSIS — R300 Dysuria: Secondary | ICD-10-CM

## 2016-04-26 LAB — URINALYSIS, ROUTINE W REFLEX MICROSCOPIC
Bilirubin Urine: NEGATIVE
Glucose, UA: 100 mg/dL — AB
Ketones, ur: NEGATIVE mg/dL
Leukocytes, UA: NEGATIVE
Nitrite: NEGATIVE
Protein, ur: NEGATIVE mg/dL
Specific Gravity, Urine: 1.015 (ref 1.005–1.030)
pH: 5.5 (ref 5.0–8.0)

## 2016-04-26 LAB — URINE MICROSCOPIC-ADD ON

## 2016-04-27 ENCOUNTER — Telehealth (HOSPITAL_COMMUNITY): Payer: Self-pay | Admitting: *Deleted

## 2016-04-27 LAB — URINE CULTURE: Culture: <10000 — AB

## 2016-05-10 ENCOUNTER — Encounter (HOSPITAL_COMMUNITY): Payer: Self-pay | Admitting: Hematology & Oncology

## 2016-05-10 ENCOUNTER — Encounter (HOSPITAL_BASED_OUTPATIENT_CLINIC_OR_DEPARTMENT_OTHER): Payer: Medicare Other

## 2016-05-10 ENCOUNTER — Encounter (HOSPITAL_BASED_OUTPATIENT_CLINIC_OR_DEPARTMENT_OTHER): Payer: Medicare Other | Admitting: Hematology & Oncology

## 2016-05-10 ENCOUNTER — Ambulatory Visit (HOSPITAL_COMMUNITY): Payer: Medicare Other

## 2016-05-10 VITALS — BP 138/59 | HR 74 | Temp 97.3°F | Resp 18 | Wt 205.4 lb

## 2016-05-10 DIAGNOSIS — E119 Type 2 diabetes mellitus without complications: Secondary | ICD-10-CM | POA: Diagnosis not present

## 2016-05-10 DIAGNOSIS — R05 Cough: Secondary | ICD-10-CM

## 2016-05-10 DIAGNOSIS — D649 Anemia, unspecified: Secondary | ICD-10-CM

## 2016-05-10 DIAGNOSIS — E538 Deficiency of other specified B group vitamins: Secondary | ICD-10-CM

## 2016-05-10 DIAGNOSIS — T451X5A Adverse effect of antineoplastic and immunosuppressive drugs, initial encounter: Secondary | ICD-10-CM

## 2016-05-10 DIAGNOSIS — D6481 Anemia due to antineoplastic chemotherapy: Secondary | ICD-10-CM

## 2016-05-10 DIAGNOSIS — C3491 Malignant neoplasm of unspecified part of right bronchus or lung: Secondary | ICD-10-CM

## 2016-05-10 DIAGNOSIS — Z5111 Encounter for antineoplastic chemotherapy: Secondary | ICD-10-CM

## 2016-05-10 DIAGNOSIS — C342 Malignant neoplasm of middle lobe, bronchus or lung: Secondary | ICD-10-CM

## 2016-05-10 DIAGNOSIS — R739 Hyperglycemia, unspecified: Secondary | ICD-10-CM

## 2016-05-10 LAB — CBC WITH DIFFERENTIAL/PLATELET
Basophils Absolute: 0 10*3/uL (ref 0.0–0.1)
Basophils Relative: 0 %
Eosinophils Absolute: 0 10*3/uL (ref 0.0–0.7)
Eosinophils Relative: 0 %
HCT: 30.2 % — ABNORMAL LOW (ref 39.0–52.0)
Hemoglobin: 10.3 g/dL — ABNORMAL LOW (ref 13.0–17.0)
Lymphocytes Relative: 12 %
Lymphs Abs: 1.5 10*3/uL (ref 0.7–4.0)
MCH: 29.3 pg (ref 26.0–34.0)
MCHC: 34.1 g/dL (ref 30.0–36.0)
MCV: 86 fL (ref 78.0–100.0)
Monocytes Absolute: 0.7 10*3/uL (ref 0.1–1.0)
Monocytes Relative: 6 %
Neutro Abs: 9.6 10*3/uL — ABNORMAL HIGH (ref 1.7–7.7)
Neutrophils Relative %: 82 %
Platelets: 287 10*3/uL (ref 150–400)
RBC: 3.51 MIL/uL — ABNORMAL LOW (ref 4.22–5.81)
RDW: 14.2 % (ref 11.5–15.5)
WBC: 11.8 10*3/uL — ABNORMAL HIGH (ref 4.0–10.5)

## 2016-05-10 LAB — COMPREHENSIVE METABOLIC PANEL
ALT: 23 U/L (ref 17–63)
AST: 32 U/L (ref 15–41)
Albumin: 3.3 g/dL — ABNORMAL LOW (ref 3.5–5.0)
Alkaline Phosphatase: 64 U/L (ref 38–126)
Anion gap: 10 (ref 5–15)
BUN: 15 mg/dL (ref 6–20)
CO2: 22 mmol/L (ref 22–32)
Calcium: 8.5 mg/dL — ABNORMAL LOW (ref 8.9–10.3)
Chloride: 101 mmol/L (ref 101–111)
Creatinine, Ser: 0.96 mg/dL (ref 0.61–1.24)
GFR calc Af Amer: >60 mL/min (ref >60)
GFR calc non Af Amer: >60 mL/min (ref >60)
Glucose, Bld: 312 mg/dL — ABNORMAL HIGH (ref 65–99)
Potassium: 3.5 mmol/L (ref 3.5–5.1)
Sodium: 133 mmol/L — ABNORMAL LOW (ref 135–145)
Total Bilirubin: 0.4 mg/dL (ref 0.3–1.2)
Total Protein: 6.1 g/dL — ABNORMAL LOW (ref 6.5–8.1)

## 2016-05-10 MED ORDER — PALONOSETRON HCL INJECTION 0.25 MG/5ML
INTRAVENOUS | Status: AC
  Filled 2016-05-10: qty 5

## 2016-05-10 MED ORDER — HEPARIN SOD (PORK) LOCK FLUSH 100 UNIT/ML IV SOLN
500.0000 [IU] | Freq: Once | INTRAVENOUS | Status: AC | PRN
  Administered 2016-05-10: 500 [IU]
  Filled 2016-05-10: qty 5

## 2016-05-10 MED ORDER — SODIUM CHLORIDE 0.9 % IV SOLN
520.0000 mg/m2 | Freq: Once | INTRAVENOUS | Status: AC
  Administered 2016-05-10: 1100 mg via INTRAVENOUS
  Filled 2016-05-10: qty 4

## 2016-05-10 MED ORDER — DEXAMETHASONE SODIUM PHOSPHATE 100 MG/10ML IJ SOLN: 10.0000 mg | Freq: Once | INTRAMUSCULAR | Status: DC

## 2016-05-10 MED ORDER — SODIUM CHLORIDE 0.9% FLUSH
10.0000 mL | INTRAVENOUS | Status: DC | PRN
  Administered 2016-05-10: 10 mL
  Filled 2016-05-10: qty 10

## 2016-05-10 MED ORDER — PALONOSETRON HCL INJECTION 0.25 MG/5ML
0.2500 mg | Freq: Once | INTRAVENOUS | Status: AC
  Administered 2016-05-10: 0.25 mg via INTRAVENOUS

## 2016-05-10 MED ORDER — SODIUM CHLORIDE 0.9 % IV SOLN
Freq: Once | INTRAVENOUS | Status: AC
  Administered 2016-05-10: 12:00:00 via INTRAVENOUS

## 2016-05-10 MED ORDER — INSULIN ASPART 100 UNIT/ML ~~LOC~~ SOLN
6.0000 [IU] | Freq: Once | SUBCUTANEOUS | Status: AC
  Administered 2016-05-10: 6 [IU] via SUBCUTANEOUS
  Filled 2016-05-10: qty 0.06

## 2016-05-10 MED ORDER — CARBOPLATIN CHEMO INJECTION 600 MG/60ML
484.0000 mg | Freq: Once | INTRAVENOUS | Status: AC
  Administered 2016-05-10: 480 mg via INTRAVENOUS
  Filled 2016-05-10: qty 48

## 2016-05-10 MED ORDER — DEXAMETHASONE SODIUM PHOSPHATE 10 MG/ML IJ SOLN
10.0000 mg | Freq: Once | INTRAMUSCULAR | Status: AC
  Administered 2016-05-10: 10 mg via INTRAVENOUS
  Filled 2016-05-10: qty 1

## 2016-05-10 MED ORDER — CYANOCOBALAMIN 1000 MCG/ML IJ SOLN
INTRAMUSCULAR | Status: AC
  Filled 2016-05-10: qty 1

## 2016-05-10 MED ORDER — CYANOCOBALAMIN 1000 MCG/ML IJ SOLN
1000.0000 ug | Freq: Once | INTRAMUSCULAR | Status: AC
  Administered 2016-05-10: 1000 ug via INTRAMUSCULAR

## 2016-05-10 NOTE — Progress Notes (Signed)
.  [name] tolerated chemo tx well without complaints or incident.Labs reviewed prior to administering chemotherapy. Glucose 312 Pt given 6 units of Novolog SQ per MD orders. VSS upon discharge. Pt discharged self ambulatory in satisfactory condition using walker accompanied by his wife

## 2016-05-10 NOTE — Patient Instructions (Signed)
[facility] Discharge Instructions for Patients Receiving Chemotherapy   Beginning January 23rd 2017 lab work for the Cancer Center will be done in the  Main lab at [place] on 1st floor. If you have a lab appointment with the Cancer Center please come in thru the  Main Entrance and check in at the main information desk   Today you received the following chemotherapy agents Carboplatin and Alimta. Follow-up as scheduled. Call clinic for any questions or concerns  To help prevent nausea and vomiting after your treatment, we encourage you to take your nausea medication   If you develop nausea and vomiting, or diarrhea that is not controlled by your medication, call the clinic.  The clinic phone number is [phone]. Office hours are Monday-Friday 8:30am-5:00pm.  BELOW ARE SYMPTOMS THAT SHOULD BE REPORTED IMMEDIATELY:  *FEVER GREATER THAN 101.0 F  *CHILLS WITH OR WITHOUT FEVER  NAUSEA AND VOMITING THAT IS NOT CONTROLLED WITH YOUR NAUSEA MEDICATION  *UNUSUAL SHORTNESS OF BREATH  *UNUSUAL BRUISING OR BLEEDING  TENDERNESS IN MOUTH AND THROAT WITH OR WITHOUT PRESENCE OF ULCERS  *URINARY PROBLEMS  *BOWEL PROBLEMS  UNUSUAL RASH Items with * indicate a potential emergency and should be followed up as soon as possible. If you have an emergency after office hours please contact your primary care physician or go to the nearest emergency department.  Please call the clinic during office hours if you have any questions or concerns.   You may also contact the Patient Navigator at [phone] should you have any questions or need assistance in obtaining follow up care.      Resources For Cancer Patients and their Caregivers ? American Cancer Society: Can assist with transportation, wigs, general needs, runs Look Good Feel Better.        1-888-227-6333 ? Cancer Care: Provides financial assistance, online support groups, medication/co-pay assistance.   1-800-813-HOPE (4673) ? [facility] Assists [place] cancer patients and their families through emotional , educational and financial support.  336-427-4357 ? [place] DSS Where to apply for food stamps, Medicaid and utility assistance. 336-342-1394 ? RCATS: Transportation to medical appointments. 336-347-2287 ? Social Security Administration: May apply for disability if have a Stage IV cancer. 336-342-7796 1-800-772-1213 ? [place] Aging, Disability and Transit Services: Assists with nutrition, care and transit needs. 336-349-2343         

## 2016-05-10 NOTE — Patient Instructions (Addendum)
[place] at [facility] Discharge Instructions  RECOMMENDATIONS MADE BY THE CONSULTANT AND ANY TEST RESULTS WILL BE SENT TO YOUR REFERRING PHYSICIAN.  You saw Dr.Penland today. Follow up with final chemo cycle (in about 3 weeks). Labs and MD appt same day. See Amy at checkout for appointments.  Thank you for choosing [place] at [facility] to provide your oncology and hematology care.  To afford each patient quality time with our provider, please arrive at least 15 minutes before your scheduled appointment time.   Beginning January 23rd 2017 lab work for the [facility] will be done in the  Main lab at [facility] on 1st floor. If you have a lab appointment with the [facility] please come in thru the  Main Entrance and check in at the main information desk  You need to re-schedule your appointment should you arrive 10 or more minutes late.  We strive to give you quality time with our providers, and arriving late affects you and other patients whose appointments are after yours.  Also, if you no show three or more times for appointments you may be dismissed from the clinic at the providers discretion.     Again, thank you for choosing [facility].  Our hope is that these requests will decrease the amount of time that you wait before being seen by our physicians.       _____________________________________________________________  Should you have questions after your visit to [facility], please contact our office at [phone] between the hours of 8:30 a.m. and 4:30 p.m.  Voicemails left after 4:30 p.m. will not be returned until the following business day.  For prescription refill requests, have your pharmacy contact our office.         Resources For Cancer Patients and their Caregivers ? American Cancer Society: Can assist with transportation, wigs, general needs, runs Look Good Feel Better.         804-520-9713 ? Cancer Care: Provides financial assistance, online support groups, medication/co-pay assistance.  1-800-813-HOPE (662)736-8086) ? [facility] Assists [place] cancer patients and their families through emotional , educational and financial support.  517-121-5151 ? [place] DSS Where to apply for food stamps, Medicaid and utility assistance. (516)295-5852 ? RCATS: Transportation to medical appointments. 445 607 5215 ? Social Security Administration: May apply for disability if have a Stage IV cancer. 212 887 3556 442-206-7833 ? [employer], Disability and Transit Services: Assists with nutrition, care and transit needs. [place] Support Programs: '@10RELATIVEDAYS'$ @ > Cancer Support Group  2nd Tuesday of the month 1pm-2pm, Journey Room  > Creative Journey  3rd Tuesday of the month 1130am-1pm, Journey Room  > Look Good Feel Better  1st Wednesday of the month 10am-12 noon, Journey Room (Call [place] to register [phone])

## 2016-05-10 NOTE — Progress Notes (Signed)
[facility] Hematology/Oncology Progress Note  Name: [name]      MRN: [id number]    Date: 05/10/2016 Time:11:15 AM   REFERRING PHYSICIAN:  Deberah Castle, NP (GI)  REASON FOR CONSULT:  "Lung Cancer"   DIAGNOSIS:      Adenocarcinoma of right lung, stage 3 ([place])   11/10/2015 Imaging    13 mm spiculated lesion seen in right middle lobe.      11/30/2015 PET scan    Spiculated right middle lobe nodule is mildly hypermetabolic and most consistent with a stage IA adenocarcinoma.      12/30/2015 Surgery    Right middle lobectomy and node dissection      12/30/2015 Procedure    Right video-assisted thoracoscopy, Thoracoscopic right middle lobectomy, Mediastinal lymph node dissection, and On-Q local anesthetic catheter placement.      01/02/2016 Pathology Results    1. Lung, resection (segmental or lobe), Right Middle Lobe - INVASIVE ADENOCARCINOMA, WELL DIFFERENTIATED, SPANNING 1.2 CM. - THE SURGICAL RESECTION MARGINS ARE NEGATIVE FOR CARCINOMA. 2. Lymph node, biopsy, Level 12 - METASTATIC CARCINOMA IN 1 OF 1 LYMPH NODE (1/1). 3. Lymph node, biopsy, Level 12 #2 - METASTATIC CARCINOMA IN 1 OF 1 LYMPH NODE (1/1). 4. Lymph node, biopsy, Level 7 - METASTATIC CARCINOMA IN 1 OF 1 LYMPH NODE (1/1/). 5. Lymph node, biopsy, Level 7 #2 - METASTATIC CARCINOMA IN 1 OF 1 LYMPH NODE (1/1). 6. Lymph node, biopsy, Level 7 #3 - METASTATIC CARCINOMA IN 1 OF 1 LYMPH NODE (1/1). 7. Lymph node, biopsy, Level 7 #4 - METASTATIC CARCINOMA IN 1 OF 1 LYMPH NODE (1/1). 8. Lymph node, biopsy, 4R - THERE IS NO EVIDENCE OF CARCINOMA IN 1 OF 1 LYMPH NODE (0/1). 9. Lymph node, biopsy, 4R #2 - THERE IS NO EVIDENCE OF CARCINOMA IN 1 OF 1 LYMPH NODE (0/1).      01/05/2016 Pathology Results    PDL1 NEGATIVE- tumor proportion score of 0%.       01/05/2016 Pathology Results    Genomic alterations identified: BRAF V600E, KIT amplification, PDGFRA amplification, CDKN2A/B loss, TP53  S13f*33.  Additional findings: MSI-STABLE.  No reportable alterations identified: EGFR, KRAS, ALK, MET, RET, ERBB2, ROS1      02/09/2016 Procedure    Port placed by IR.      02/16/2016 -  Chemotherapy    The patient had palonosetron (ALOXI) injection 0.25 mg, 0.25 mg, Intravenous,  Once, 0 of 4 cycles  PEMEtrexed (ALIMTA) 1,050 mg in sodium chloride 0.9 % 100 mL chemo infusion, 500 mg/m2, Intravenous,  Once, 0 of 4 cycles  CARBOplatin (PARAPLATIN) in sodium chloride 0.9 % 100 mL chemo infusion, , Intravenous,  Once, 0 of 4 cycles  palonosetron (ALOXI) injection 0.25 mg, 0.25 mg, Intravenous,  Once, 1 of 6 cycles  PEMEtrexed (ALIMTA) 1,050 mg in sodium chloride 0.9 % 100 mL chemo infusion, 500 mg/m2 = 1,050 mg, Intravenous,  Once, 1 of 6 cycles  CARBOplatin (PARAPLATIN) 480 mg in sodium chloride 0.9 % 250 mL chemo infusion, 480 mg (100 % of original dose 484 mg), Intravenous,  Once, 1 of 6 cycles Dose modification:   (original dose 484 mg, Cycle 1)  for chemotherapy treatment.         HISTORY OF PRESENT ILLNESS:   DRODELL MARRSis a 82y.o. male who presents today for further follow-up of a stage III adenocarcinoma of the lung. He continues on carboplatin/alimta. He is doing really well.  Appetite is good. He saw Dr. Roxan Hockey on 10/17. He notes that no further follow-up has been scheduled.  Patient is here for cycle 5 of Alimta/carboplatin today. He continues to experience back pain but does not feel it is worse. He tries to walk around the house with walker. He reports normal appetite.Realistically he denies any changes from his last visit.   Patient takes half of wife's xanax on a rare occasion to calm him down and feels it helps. He experiences some difficulty sleeping Denies nausea or dyspnea.  Review of Systems  Constitutional: Negative for chills, fever, malaise/fatigue and weight loss.       Uses a walker  HENT: Negative for congestion, sore throat and tinnitus.   Eyes:  Negative for blurred vision and double vision.  Respiratory: Negative for cough, hemoptysis and shortness of breath.   Cardiovascular: Negative for chest pain, palpitations, orthopnea and leg swelling.  Gastrointestinal: Negative for abdominal pain, blood in stool, constipation, diarrhea, melena, nausea and vomiting.  Genitourinary: Negative for dysuria and urgency.  Musculoskeletal: Negative.   Skin: Negative for itching and rash.  Neurological: Negative.  Negative for dizziness, sensory change, speech change, focal weakness, seizures, loss of consciousness, weakness and headaches.  Endo/Heme/Allergies: Negative.   Psychiatric/Behavioral: Negative.   14 point review of systems was performed and is negative except as detailed under history of present illness and above   PAST MEDICAL HISTORY:   Past Medical History:  Diagnosis Date  . Adenocarcinoma of lung, stage 3 (HCC)    Stage IIIA  . Adenocarcinoma of right lung, stage 3 ([place]) 12/02/2015  . Arthritis   . Atrial fibrillation, transient ([place])    transient postop, < 24 hours  . B12 deficiency   . Cyst of scrotum   . Diverticulitis   . GERD (gastroesophageal reflux disease)   . Hernia   . History of pneumonia   . Hyperlipidemia   . Lung nodule    Spiculated right middle lobe nodule, hypermetabolic  . Type 2 diabetes mellitus (HCC)     ALLERGIES: Allergies  Allergen Reactions  . Morphine And Related Other (See Comments)    confusion      MEDICATIONS: I have reviewed the patient's current medications.    Current Outpatient Prescriptions on File Prior to Visit  Medication Sig Dispense Refill  . aspirin EC 81 MG tablet Take 81 mg by mouth daily.    . calcium carbonate (OS-CAL - DOSED IN MG OF ELEMENTAL CALCIUM) 1250 (500 Ca) MG tablet Take 1 tablet (500 mg of elemental calcium total) by mouth daily with breakfast. 30 tablet 2  . CARBOPLATIN IV Inject into the vein. Every 3 weeks    . Cholecalciferol (VITAMIN D) 2000 UNITS  CAPS Take 1 capsule by mouth daily.     . Cyanocobalamin (VITAMIN B-12 IJ) Inject 1 Dose as directed every 30 (thirty) days.    Marland Kitchen dexamethasone (DECADRON) 4 MG tablet The day before, day of, and day after chemo take 2 tablets in the am and 2 tablets in the pm with food. 36 tablet 1  . folic acid (FOLVITE) 1 MG tablet Take 1 tablet daily and then take for 21 days past the completion of pemetrexed chemo. 30 tablet 6  . glipiZIDE (GLUCOTROL) 5 MG tablet Take 1 tablet (5 mg total) by mouth 2 (two) times daily before a meal. 60 tablet 1  . HYDROcodone-acetaminophen (NORCO/VICODIN) 5-325 MG tablet Take 1-2 tablets by mouth every 4 (four) hours as needed for moderate  pain. 30 tablet 0  . lidocaine-prilocaine (EMLA) cream Apply a quarter size amount to port site 1 hour prior to chemo. Do not rub in. Cover with plastic wrap. 30 g 3  . lisinopril (PRINIVIL,ZESTRIL) 40 MG tablet Take 1 tablet (40 mg total) by mouth daily. 30 tablet 1  . loratadine (CLARITIN) 10 MG tablet Take 10 mg by mouth daily.    . meclizine (ANTIVERT) 25 MG tablet Take 25 mg by mouth daily as needed for dizziness.    . ondansetron (ZOFRAN) 8 MG tablet Take 1 tablet (8 mg total) by mouth every 8 (eight) hours as needed for nausea or vomiting. 30 tablet 2  . oxyCODONE (OXY IR/ROXICODONE) 5 MG immediate release tablet Take 1-2 tablets (5-10 mg total) by mouth every 6 (six) hours as needed for severe pain. 30 tablet 0  . pantoprazole (PROTONIX) 40 MG tablet Take 40 mg by mouth daily.     Marland Kitchen PEMEtrexed Disodium (ALIMTA IV) Inject into the vein. Every 3 weeks    . polyethylene glycol (MIRALAX / GLYCOLAX) packet Take 17 g by mouth daily.    . prochlorperazine (COMPAZINE) 10 MG tablet Take 1 tablet (10 mg total) by mouth every 6 (six) hours as needed for nausea or vomiting. 30 tablet 2  . psyllium (METAMUCIL) 58.6 % powder Take 1 packet by mouth daily.    . simvastatin (ZOCOR) 80 MG tablet Take 40 mg by mouth at bedtime.     Marland Kitchen terazosin (HYTRIN)  5 MG capsule Take 5 mg by mouth at bedtime.    Marland Kitchen tiZANidine (ZANAFLEX) 4 MG tablet Take 4 mg by mouth at bedtime.     No current facility-administered medications on file prior to visit.      PAST SURGICAL HISTORY Past Surgical History:  Procedure Laterality Date  . BACK SURGERY    . CHOLECYSTECTOMY    . COLONOSCOPY N/A 11/24/2015   Procedure: COLONOSCOPY;  Surgeon: Rogene Houston, MD;  Location: [facility];  Service: Endoscopy;  Laterality: N/A;  730  . COLONOSCOPY W/ POLYPECTOMY     x 5  . CYST EXCISION     Scrotum  . EYE SURGERY Bilateral    Cataract with Lens  . HERNIA REPAIR Right    Inguinal  . IR GENERIC HISTORICAL  02/09/2016   IR US GUIDE VASC ACCESS RIGHT 02/09/2016 Arne Cleveland, MD [facility]  . IR GENERIC HISTORICAL  02/09/2016   IR FLUORO GUIDE CV LINE RIGHT 02/09/2016 Arne Cleveland, MD [facility]  . LUMBAR DISC SURGERY     per patient report  . VIDEO ASSISTED THORACOSCOPY (VATS)/ LOBECTOMY Right 12/30/2015   Procedure: VIDEO ASSISTED THORACOSCOPY (VATS)/RIGHT MIDDLE LOBECTOMY;  Surgeon: Melrose Nakayama, MD;  Location: [facility];  Service: Thoracic;  Laterality: Right;    FAMILY HISTORY: Family History  Problem Relation Age of Onset  . Colon cancer Brother   Mother died at the age of 64 secondary to complications associated with childbirth. Fatjer passed in his 29's from a possible stroke versus blood clot. He has 1 brother who is 78 years old He has 1 sister who is 29 years old He has another borther who is 80 years old.   His youngest brother is 25 years old.  1 brother is deceased at the age of 34 secondary to colon cancer.  SOCIAL HISTORY: He is an ex-smoker, quitting about 15 years ago after smoking 0.5ppd x 50 years.  He does not drink EtOH, but he admits that  he used to drink EtOH.  He denies any illicit drug abuse.  He denies any religious affiliation.  He is a retired Marine scientist.  Social History   Social History  . Marital  status: Married    Spouse name: N/A  . Number of children: N/A  . Years of education: N/A   Social History Main Topics  . Smoking status: Former Smoker    Types: Cigarettes    Quit date: 05/27/2004  . Smokeless tobacco: Never Used  . Alcohol use No  . Drug use: No  . Sexual activity: Not Asked     Comment: married   Other Topics Concern  . None   Social History Narrative  . None    PERFORMANCE STATUS: The patient's performance status is 1 - Symptomatic but completely ambulatory  PHYSICAL EXAM: Most Recent Vital Signs: There were no vitals taken for this visit. There were no vitals taken for this visit.   Vitals with BMI 05/10/2016  Height   Weight 205 lbs 6 oz  BMI   Systolic 143  Diastolic 56  Pulse 79  Respirations 18    General Appearance:    Alert, cooperative, no distress, appears stated age, accompanied by his wife.  Head:    Normocephalic, without obvious abnormality, atraumatic  Eyes:    Conjunctiva/corneas clear, EOM's intact       Ears:    External anatomy is normal.  Nose:   Nares normal, mucosa normal, no drainage or sinus tenderness  Throat:   Lips, mucosa, and tongue normal  Neck:   Supple, symmetrical, trachea midline, no adenopathy;       thyroid:  No enlargement/tenderness/nodules  Back:     Symmetric, no curvature, ROM normal, no CVA tenderness  Lungs:     Clear to auscultation bilaterally, respirations unlabored  Chest wall:    No tenderness or deformity Several incision sites all well healed.No edema noted.  Heart:    Regular rate and rhythm, S1 and S2 normal, no murmur, rub   or gallop  Abdomen:     Soft, non-tender, bowel sounds active all four quadrants,    no masses, no organomegaly        Extremities:   Extremities normal, atraumatic, no cyanosis or edema  Pulses:   2+ and symmetric all extremities  Skin:   Skin color, texture, turgor normal, no rashes or lesions  Lymph nodes:   Cervical, supraclavicular, and axillary nodes normal    Neurologic:   CNII-XII intact. Normal strength, sensation and reflexes      throughout    LABORATORY DATA:  I have reviewed the data as listed. CBC    Component Value Date/Time   WBC 11.8 (H) 05/10/2016 1006   RBC 3.51 (L) 05/10/2016 1006   HGB 10.3 (L) 05/10/2016 1006   HCT 30.2 (L) 05/10/2016 1006   PLT 287 05/10/2016 1006   MCV 86.0 05/10/2016 1006   MCH 29.3 05/10/2016 1006   MCHC 34.1 05/10/2016 1006   RDW 14.2 05/10/2016 1006   LYMPHSABS 1.5 05/10/2016 1006   MONOABS 0.7 05/10/2016 1006   EOSABS 0.0 05/10/2016 1006   BASOSABS 0.0 05/10/2016 1006      Chemistry      Component Value Date/Time   NA 133 (L) 05/10/2016 1006   K 3.5 05/10/2016 1006   CL 101 05/10/2016 1006   CO2 22 05/10/2016 1006   BUN 15 05/10/2016 1006   CREATININE 0.96 05/10/2016 1006   CREATININE 0.95 11/03/2015 1452  Component Value Date/Time   CALCIUM 8.5 (L) 05/10/2016 1006   ALKPHOS 64 05/10/2016 1006   AST 32 05/10/2016 1006   ALT 23 05/10/2016 1006   BILITOT 0.4 05/10/2016 1006     Lab Results  Component Value Date   IRON 71 12/07/2015   TIBC 302 12/07/2015   FERRITIN 40 12/07/2015   Lab Results  Component Value Date   VITAMINB12 170 (L) 12/07/2015   Lab Results  Component Value Date   FOLATE 39.9 12/07/2015     RADIOGRAPHY: I have personally reviewed the radiological images as listed and agreed with the findings in the report.  Study Result   CLINICAL DATA:  Right sided lung cancer.  EXAM: CHEST  2 VIEW  COMPARISON:  01/20/2016  FINDINGS: There is a right chest wall port a catheter with tip in the projection of the cavoatrial junction. No pleural effusion or edema. Postoperative appearance of the right lung with volume loss and asymmetric elevation of the right hemidiaphragm. There is spondylosis throughout the thoracic spine.  IMPRESSION: 1. No acute cardiopulmonary abnormalities. 2. Stable postoperative appearance of the right  lung.   Electronically Signed   By: Kerby Moors M.D.   On: 03/27/2016 10:11    Study Result   CLINICAL DATA:  Right middle lobectomy.  EXAM: CHEST  2 VIEW  COMPARISON:  01/03/2016.  FINDINGS: Mediastinum hilar structures normal. Cardiomegaly with normal pulmonary vascularity. Postsurgical changes right lung with right base atelectasis and or scarring. No pneumothorax. No acute bony abnormality.  IMPRESSION: 1. Postsurgical changes right lung. Right base atelectasis and or scarring. No pneumothorax.  2.  No acute cardiopulmonary disease.  Stable cardiomegaly .   Electronically Signed   By: Marcello Moores  Register   On: 01/20/2016 10:11     PATHOLOGY:     Diagnosis Colon, polyp(s), ascending - TUBULAR ADENOMA(S). - HIGH GRADE DYSPLASIA IS NOT IDENTIFIED. Casimer Lanius MD Pathologist, Electronic Signature (Case signed 11/25/2015)   ASSESSMENT/PLAN:  RML nodule Stage IIIA NSCLC, T1a,N2,M0 Post op Afib RM lobectomy, mediastinal LN dissection  He is currently on Carboplatin/Alimta and has done well. No indication for XRT concurrently given no bulky N2 disease. Patient was seen by Dr. Roxan Hockey on the 17th and discharged.   He is eating well. Weight is overall stable. I have encouraged him to continue with boost/ensure once daily.  Patient has been tolerating treatment well. He has one more cycle of chemotherapy and then he will be sent for radiation consultation.   B12 deficiency B12 deficiency requiring IM B12 replacement. Continue ongoing replacement.  He has noticed a significant improvement in his neuropathy.   Antibody testing for pernicious anemia negative.   Anemia  Most likely chemotherapy induced. Will folllow. If further progression will do additional anemia evaluation.   Cough  Improved. Will continue to monitor.   Follow-up with patient in 3 weeks prior to final cycle of therapy.   Orders Placed This Encounter  Procedures  . CBC  with Differential    Standing Status:   Future    Standing Expiration Date:   05/10/2017  . Comprehensive metabolic panel    Standing Status:   Future    Standing Expiration Date:   05/10/2017     No orders of the defined types were placed in this encounter.  All questions were answered. The patient knows to call the clinic with any problems, questions or concerns. We can certainly see the patient much sooner if necessary.  This document serves as a  record of services personally performed by Ancil Linsey, MD. It was created on her behalf by Elmyra Ricks, a trained medical scribe. The creation of this record is based on the scribe's personal observations and the provider's statements to them. This document has been checked and approved by the attending provider.  I have reviewed the above documentation for accuracy and completeness, and I agree with the above.  This note is electronically signed [email or web address] Cyril Mourning, MD  05/10/2016 11:15 AM

## 2016-05-13 ENCOUNTER — Encounter (HOSPITAL_COMMUNITY): Payer: Self-pay | Admitting: Hematology & Oncology

## 2016-05-31 ENCOUNTER — Encounter (HOSPITAL_COMMUNITY): Payer: Self-pay | Admitting: Oncology

## 2016-05-31 ENCOUNTER — Ambulatory Visit (HOSPITAL_COMMUNITY): Payer: Medicare Other

## 2016-05-31 ENCOUNTER — Encounter (HOSPITAL_BASED_OUTPATIENT_CLINIC_OR_DEPARTMENT_OTHER): Payer: Medicare Other

## 2016-05-31 ENCOUNTER — Encounter (HOSPITAL_COMMUNITY): Payer: Medicare Other | Attending: Hematology & Oncology | Admitting: Oncology

## 2016-05-31 VITALS — BP 155/70 | HR 88 | Temp 97.7°F | Resp 18 | Wt 204.6 lb

## 2016-05-31 DIAGNOSIS — M545 Low back pain: Secondary | ICD-10-CM | POA: Diagnosis not present

## 2016-05-31 DIAGNOSIS — R918 Other nonspecific abnormal finding of lung field: Secondary | ICD-10-CM | POA: Diagnosis not present

## 2016-05-31 DIAGNOSIS — E538 Deficiency of other specified B group vitamins: Secondary | ICD-10-CM

## 2016-05-31 DIAGNOSIS — D649 Anemia, unspecified: Secondary | ICD-10-CM | POA: Insufficient documentation

## 2016-05-31 DIAGNOSIS — C3491 Malignant neoplasm of unspecified part of right bronchus or lung: Secondary | ICD-10-CM | POA: Diagnosis present

## 2016-05-31 DIAGNOSIS — Z5111 Encounter for antineoplastic chemotherapy: Secondary | ICD-10-CM

## 2016-05-31 DIAGNOSIS — E119 Type 2 diabetes mellitus without complications: Secondary | ICD-10-CM | POA: Diagnosis not present

## 2016-05-31 LAB — CBC WITH DIFFERENTIAL/PLATELET
Basophils Absolute: 0 10*3/uL (ref 0.0–0.1)
Basophils Relative: 0 %
Eosinophils Absolute: 0 10*3/uL (ref 0.0–0.7)
Eosinophils Relative: 0 %
HCT: 30.6 % — ABNORMAL LOW (ref 39.0–52.0)
Hemoglobin: 10.6 g/dL — ABNORMAL LOW (ref 13.0–17.0)
Lymphocytes Relative: 12 %
Lymphs Abs: 1.3 10*3/uL (ref 0.7–4.0)
MCH: 30.5 pg (ref 26.0–34.0)
MCHC: 34.6 g/dL (ref 30.0–36.0)
MCV: 87.9 fL (ref 78.0–100.0)
Monocytes Absolute: 0.9 10*3/uL (ref 0.1–1.0)
Monocytes Relative: 8 %
Neutro Abs: 8.9 10*3/uL — ABNORMAL HIGH (ref 1.7–7.7)
Neutrophils Relative %: 80 %
Platelets: 345 10*3/uL (ref 150–400)
RBC: 3.48 MIL/uL — ABNORMAL LOW (ref 4.22–5.81)
RDW: 14.6 % (ref 11.5–15.5)
WBC: 11.1 10*3/uL — ABNORMAL HIGH (ref 4.0–10.5)

## 2016-05-31 LAB — COMPREHENSIVE METABOLIC PANEL
ALT: 24 U/L (ref 17–63)
AST: 33 U/L (ref 15–41)
Albumin: 3.5 g/dL (ref 3.5–5.0)
Alkaline Phosphatase: 57 U/L (ref 38–126)
Anion gap: 11 (ref 5–15)
BUN: 17 mg/dL (ref 6–20)
CO2: 23 mmol/L (ref 22–32)
Calcium: 8.7 mg/dL — ABNORMAL LOW (ref 8.9–10.3)
Chloride: 100 mmol/L — ABNORMAL LOW (ref 101–111)
Creatinine, Ser: 1.23 mg/dL (ref 0.61–1.24)
GFR calc Af Amer: >60 mL/min (ref >60)
GFR calc non Af Amer: 52 mL/min — ABNORMAL LOW (ref >60)
Glucose, Bld: 293 mg/dL — ABNORMAL HIGH (ref 65–99)
Potassium: 3.9 mmol/L (ref 3.5–5.1)
Sodium: 134 mmol/L — ABNORMAL LOW (ref 135–145)
Total Bilirubin: 0.7 mg/dL (ref 0.3–1.2)
Total Protein: 6.6 g/dL (ref 6.5–8.1)

## 2016-05-31 MED ORDER — HEPARIN SOD (PORK) LOCK FLUSH 100 UNIT/ML IV SOLN
500.0000 [IU] | Freq: Once | INTRAVENOUS | Status: AC | PRN
  Administered 2016-05-31: 500 [IU]
  Filled 2016-05-31: qty 5

## 2016-05-31 MED ORDER — PALONOSETRON HCL INJECTION 0.25 MG/5ML
0.2500 mg | Freq: Once | INTRAVENOUS | Status: AC
  Administered 2016-05-31: 0.25 mg via INTRAVENOUS
  Filled 2016-05-31: qty 5

## 2016-05-31 MED ORDER — SODIUM CHLORIDE 0.9% FLUSH
10.0000 mL | INTRAVENOUS | Status: DC | PRN
Start: 2016-05-31 — End: 2016-05-31

## 2016-05-31 MED ORDER — SODIUM CHLORIDE 0.9 % IV SOLN
Freq: Once | INTRAVENOUS | Status: AC
  Administered 2016-05-31: 12:00:00 via INTRAVENOUS

## 2016-05-31 MED ORDER — DEXAMETHASONE SODIUM PHOSPHATE 10 MG/ML IJ SOLN
10.0000 mg | Freq: Once | INTRAMUSCULAR | Status: AC
  Administered 2016-05-31: 10 mg via INTRAVENOUS
  Filled 2016-05-31: qty 1

## 2016-05-31 MED ORDER — SODIUM CHLORIDE 0.9 % IV SOLN: 10.0000 mg | Freq: Once | INTRAVENOUS | Status: DC

## 2016-05-31 MED ORDER — HEPARIN SOD (PORK) LOCK FLUSH 100 UNIT/ML IV SOLN
INTRAVENOUS | Status: AC
  Filled 2016-05-31: qty 5

## 2016-05-31 MED ORDER — CYANOCOBALAMIN 1000 MCG/ML IJ SOLN
1000.0000 ug | Freq: Once | INTRAMUSCULAR | Status: AC
  Administered 2016-05-31: 1000 ug via INTRAMUSCULAR
  Filled 2016-05-31: qty 1

## 2016-05-31 MED ORDER — PEMETREXED DISODIUM CHEMO INJECTION 500 MG
500.0000 mg/m2 | Freq: Once | INTRAVENOUS | Status: AC
  Administered 2016-05-31: 1050 mg via INTRAVENOUS
  Filled 2016-05-31: qty 2

## 2016-05-31 MED ORDER — INSULIN ASPART 100 UNIT/ML ~~LOC~~ SOLN
6.0000 [IU] | Freq: Once | SUBCUTANEOUS | Status: AC
  Administered 2016-05-31: 6 [IU] via SUBCUTANEOUS
  Filled 2016-05-31: qty 0.06

## 2016-05-31 MED ORDER — SODIUM CHLORIDE 0.9 % IV SOLN
417.0000 mg | Freq: Once | INTRAVENOUS | Status: AC
  Administered 2016-05-31: 420 mg via INTRAVENOUS
  Filled 2016-05-31: qty 42

## 2016-05-31 NOTE — Assessment & Plan Note (Signed)
Stage IIIA ([id number]) adenocarcinoma of right lung, S/P R VATS, RML lobectomy, and mediastinal lymph node dissection by Dr. Roxan Hockey on 12/30/2015.  On adjuvant systemic chemotherapy consisting of Carboplatin/Alimta beginning on 02/16/2016- 05/31/2016.  Oncology history is updated.  Pre-treatment labs: CBC diff, CMET.  I personally reviewed and went over laboratory results with the patient.  The results are noted within this dictation.  Hyperglycemia is noted at 293 today.  Order placed for 6 units of insulin.  Treatment #6 today.  Referral to Radiation Oncology for sequential XRT.  We have sent referral to [facility].  I have messaged Dr. Tammi Klippel to update him as well regarding the patient's treatment status and referral to Radiation Oncology.  Return in ~ 5 weeks for follow-up.

## 2016-05-31 NOTE — Patient Instructions (Signed)
[facility] ([facility]) Discharge Instructions for Patients Receiving Chemotherapy   Beginning January 23rd 2017 lab work for the [facility] will be done in the  Main lab at [facility] on 1st floor. If you have a lab appointment with the [place] please come in thru the  Main Entrance and check in at the main information desk   Today you received the following chemotherapy agents: Carbo and Alimta.     If you develop nausea and vomiting, or diarrhea that is not controlled by your medication, call the clinic.  The clinic phone number is [phone]. Office hours are Monday-Friday 8:30am-5:00pm.  BELOW ARE SYMPTOMS THAT SHOULD BE REPORTED IMMEDIATELY:  *FEVER GREATER THAN 101.0 F  *CHILLS WITH OR WITHOUT FEVER  NAUSEA AND VOMITING THAT IS NOT CONTROLLED WITH YOUR NAUSEA MEDICATION  *UNUSUAL SHORTNESS OF BREATH  *UNUSUAL BRUISING OR BLEEDING  TENDERNESS IN MOUTH AND THROAT WITH OR WITHOUT PRESENCE OF ULCERS  *URINARY PROBLEMS  *BOWEL PROBLEMS  UNUSUAL RASH Items with * indicate a potential emergency and should be followed up as soon as possible. If you have an emergency after office hours please contact your primary care physician or go to the nearest emergency department.  Please call the clinic during office hours if you have any questions or concerns.   You may also contact the Patient Navigator at [phone] should you have any questions or need assistance in obtaining follow up care.      Resources For Cancer Patients and their Caregivers ? American Cancer Society: Can assist with transportation, wigs, general needs, runs Look Good Feel Better.        573-233-7565 ? Cancer Care: Provides financial assistance, online support groups, medication/co-pay assistance.  1-800-813-HOPE 331 560 8216) ? [place] Assists [place] cancer patients and their families through emotional , educational and financial support.   684-406-3943 ? [place] DSS Where to apply for food stamps, Medicaid and utility assistance. [phone] ? RCATS: Transportation to medical appointments. 431-613-0884 ? Social Security Administration: May apply for disability if have a Stage IV cancer. 805-055-5443 604-831-6507 ? [employer], Disability and Transit Services: Assists with nutrition, care and transit needs. [phone]

## 2016-05-31 NOTE — Assessment & Plan Note (Signed)
B12 deficiency requiring IM B12 replacement.  Patient has opted to continue with IM B12 replacement monthly.  B12 injection next week.  Monthly B12 injection thereafter.

## 2016-05-31 NOTE — Patient Instructions (Addendum)
[place] at [facility] Discharge Instructions  RECOMMENDATIONS MADE BY THE CONSULTANT AND ANY TEST RESULTS WILL BE SENT TO YOUR REFERRING PHYSICIAN.  You were seen today by [name] PA-C. B12 next week. B12 monthly. LAST treatment today!!!! Referring to [place] for radiation therapy. Return in 5 weeks for follow up and B12.    Thank you for choosing [place] at [facility] to provide your oncology and hematology care.  To afford each patient quality time with our provider, please arrive at least 15 minutes before your scheduled appointment time.   Beginning January 23rd 2017 lab work for the [facility] will be done in the  Main lab at [facility] on 1st floor. If you have a lab appointment with the [place] please come in thru the  Main Entrance and check in at the main information desk  You need to re-schedule your appointment should you arrive 10 or more minutes late.  We strive to give you quality time with our providers, and arriving late affects you and other patients whose appointments are after yours.  Also, if you no show three or more times for appointments you may be dismissed from the clinic at the providers discretion.     Again, thank you for choosing [facility].  Our hope is that these requests will decrease the amount of time that you wait before being seen by our physicians.       _____________________________________________________________  Should you have questions after your visit to [facility], please contact our office at [phone] between the hours of 8:30 a.m. and 4:30 p.m.  Voicemails left after 4:30 p.m. will not be returned until the following business day.  For prescription refill requests, have your pharmacy contact our office.         Resources For Cancer Patients and their Caregivers ? American Cancer Society: Can assist with transportation, wigs, general needs,  runs Look Good Feel Better.        ([phone] ? Cancer Care: Provides financial assistance, online support groups, medication/co-pay assistance.  1-800-813-HOPE 715-786-6121) ? [place] Assists [place] cancer patients and their families through emotional , educational and financial support.  385-518-7842 ? [place] DSS Where to apply for food stamps, Medicaid and utility assistance. [phone] ? RCATS: Transportation to medical appointments. [phone] ? Social Security Administration: May apply for disability if have a Stage IV cancer. 210-711-6116 581-678-1654 ? [employer], Disability and Transit Services: Assists with nutrition, care and transit needs. [place] Support Programs: '@10RELATIVEDAYS'$ @ > Cancer Support Group  2nd Tuesday of the month 1pm-2pm, Journey Room  > Creative Journey  3rd Tuesday of the month 1130am-1pm, Journey Room  > Look Good Feel Better  1st Wednesday of the month 10am-12 noon, Journey Room (Call [place] to register [phone])

## 2016-05-31 NOTE — Progress Notes (Signed)
[facility], MD [address] [name] [id number]  Adenocarcinoma of right lung, stage 3 ([place])  B12 deficiency  CURRENT THERAPY: Carboplatin/Alimta beginning on 02/16/2016  INTERVAL HISTORY: [name] 82 y.o. male returns for followup of Stage IIIA ([id number]) adenocarcinoma of right lung, S/P R VATS, RML lobectomy, and mediastinal lymph node dissection by Dr. Roxan Hockey on 12/30/2015.    Adenocarcinoma of right lung, stage 3 (HCC)   11/10/2015 Imaging    13 mm spiculated lesion seen in right middle lobe.      11/30/2015 PET scan    Spiculated right middle lobe nodule is mildly hypermetabolic and most consistent with a stage IA adenocarcinoma.      12/30/2015 Surgery    Right middle lobectomy and node dissection      12/30/2015 Procedure    Right video-assisted thoracoscopy, Thoracoscopic right middle lobectomy, Mediastinal lymph node dissection, and On-Q local anesthetic catheter placement.      01/02/2016 Pathology Results    1. Lung, resection (segmental or lobe), Right Middle Lobe - INVASIVE ADENOCARCINOMA, WELL DIFFERENTIATED, SPANNING 1.2 CM. - THE SURGICAL RESECTION MARGINS ARE NEGATIVE FOR CARCINOMA. 2. Lymph node, biopsy, Level 12 - METASTATIC CARCINOMA IN 1 OF 1 LYMPH NODE (1/1). 3. Lymph node, biopsy, Level 12 #2 - METASTATIC CARCINOMA IN 1 OF 1 LYMPH NODE (1/1). 4. Lymph node, biopsy, Level 7 - METASTATIC CARCINOMA IN 1 OF 1 LYMPH NODE (1/1/). 5. Lymph node, biopsy, Level 7 #2 - METASTATIC CARCINOMA IN 1 OF 1 LYMPH NODE (1/1). 6. Lymph node, biopsy, Level 7 #3 - METASTATIC CARCINOMA IN 1 OF 1 LYMPH NODE (1/1). 7. Lymph node, biopsy, Level 7 #4 - METASTATIC CARCINOMA IN 1 OF 1 LYMPH NODE (1/1). 8. Lymph node, biopsy, 4R - THERE IS NO EVIDENCE OF CARCINOMA IN 1 OF 1 LYMPH NODE (0/1). 9. Lymph node, biopsy, 4R #2 - THERE IS NO EVIDENCE OF CARCINOMA IN 1 OF 1 LYMPH NODE (0/1).      01/05/2016 Pathology Results    PDL1 NEGATIVE- tumor proportion score  of 0%.       01/05/2016 Pathology Results    Genomic alterations identified: BRAF V600E, KIT amplification, PDGFRA amplification, CDKN2A/B loss, TP53 S71f*33.  Additional findings: MSI-STABLE.  No reportable alterations identified: EGFR, KRAS, ALK, MET, RET, ERBB2, ROS1      02/09/2016 Procedure    Port placed by IR.      02/16/2016 - 05/31/2016 Chemotherapy    Carboplatin/Pemetrexed x 6 cycles        He continues to tolerate treatment well.  Today is his 6th and FINAL cycle of chemotherapy.    He notes low back pain that is chronic.  His wife confirms the chronicity of this complaint and he back pain has been present x years.  He did have back surgery including a fusion, he reports.  He wants to continue with monthly B12 injections which is appropriate given his low B12 level in the past.  Review of Systems  Constitutional: Negative.  Negative for chills, fever and weight loss.  HENT: Negative.   Eyes: Negative.   Respiratory: Negative.  Negative for cough, sputum production and shortness of breath.   Cardiovascular: Negative for chest pain.  Gastrointestinal: Negative.  Negative for constipation, diarrhea, nausea and vomiting.  Genitourinary: Negative.   Musculoskeletal: Positive for back pain (chronic and at baseline).  Skin: Negative.   Neurological: Negative.  Negative for weakness.  Endo/Heme/Allergies: Negative.   Psychiatric/Behavioral: Negative.  Past Medical History:  Diagnosis Date  . Adenocarcinoma of lung, stage 3 (HCC)    Stage IIIA  . Adenocarcinoma of right lung, stage 3 ([name]) 12/02/2015  . Arthritis   . Atrial fibrillation, transient ([place])    transient postop, < 24 hours  . B12 deficiency   . Cyst of scrotum   . Diverticulitis   . GERD (gastroesophageal reflux disease)   . Hernia   . History of pneumonia   . Hyperlipidemia   . Lung nodule    Spiculated right middle lobe nodule, hypermetabolic  . Type 2 diabetes mellitus ([place])     Past  Surgical History:  Procedure Laterality Date  . BACK SURGERY    . CHOLECYSTECTOMY    . COLONOSCOPY N/A 11/24/2015   Procedure: COLONOSCOPY;  Surgeon: Rogene Houston, MD;  Location: [facility];  Service: Endoscopy;  Laterality: N/A;  730  . COLONOSCOPY W/ POLYPECTOMY     x 5  . CYST EXCISION     Scrotum  . EYE SURGERY Bilateral    Cataract with Lens  . HERNIA REPAIR Right    Inguinal  . IR GENERIC HISTORICAL  02/09/2016   IR US GUIDE VASC ACCESS RIGHT 02/09/2016 Arne Cleveland, MD [facility]INTER[facility]  . IR GENERIC HISTORICAL  02/09/2016   IR FLUORO GUIDE CV LINE RIGHT 02/09/2016 Arne Cleveland, MD [facility]INTER[facility]  . LUMBAR DISC SURGERY     per patient report  . VIDEO ASSISTED THORACOSCOPY (VATS)/ LOBECTOMY Right 12/30/2015   Procedure: VIDEO ASSISTED THORACOSCOPY (VATS)/RIGHT MIDDLE LOBECTOMY;  Surgeon: Melrose Nakayama, MD;  Location: [facility];  Service: Thoracic;  Laterality: Right;    Family History  Problem Relation Age of Onset  . Colon cancer Brother     Social History   Social History  . Marital status: Married    Spouse name: N/A  . Number of children: N/A  . Years of education: N/A   Social History Main Topics  . Smoking status: Former Smoker    Types: Cigarettes    Quit date: 05/27/2004  . Smokeless tobacco: Never Used  . Alcohol use No  . Drug use: No  . Sexual activity: Not Asked     Comment: married   Other Topics Concern  . None   Social History Narrative  . None     PHYSICAL EXAMINATION  ECOG PERFORMANCE STATUS: 1 - Symptomatic but completely ambulatory  There were no vitals filed for this visit.  Vitals - 1 value per visit 20/35/5974  SYSTOLIC 163  DIASTOLIC 64  Pulse 95  Temperature 97.5  Respirations 18  Weight (lb) 204.6    GENERAL:alert, no distress, well nourished, well developed, comfortable, cooperative, obese, smiling and accompanied by his wife. SKIN: skin color, texture, turgor are normal, no rashes or significant lesions HEAD:  Normocephalic, No masses, lesions, tenderness or abnormalities EYES: normal, EOMI, Conjunctiva are pink and non-injected EARS: External ears normal OROPHARYNX:lips, buccal mucosa, and tongue normal  NECK: supple, trachea midline LYMPH:  not examined BREAST:not examined LUNGS: clear to auscultation  HEART: regular rate & rhythm ABDOMEN:abdomen soft, non-tender, obese and normal bowel sounds BACK: Back symmetric, no curvature. EXTREMITIES:less then 2 second capillary refill, no joint deformities, effusion, or inflammation, no skin discoloration, no clubbing, no cyanosis. NEURO: alert & oriented x 3 with fluent speech, no focal motor/sensory deficits, gait normal   LABORATORY DATA: CBC    Component Value Date/Time   WBC 11.1 (H) 05/31/2016 1024   RBC 3.48 (L) 05/31/2016  1024   HGB 10.6 (L) 05/31/2016 1024   HCT 30.6 (L) 05/31/2016 1024   PLT 345 05/31/2016 1024   MCV 87.9 05/31/2016 1024   MCH 30.5 05/31/2016 1024   MCHC 34.6 05/31/2016 1024   RDW 14.6 05/31/2016 1024   LYMPHSABS 1.3 05/31/2016 1024   MONOABS 0.9 05/31/2016 1024   EOSABS 0.0 05/31/2016 1024   BASOSABS 0.0 05/31/2016 1024      Chemistry      Component Value Date/Time   NA 134 (L) 05/31/2016 1024   K 3.9 05/31/2016 1024   CL 100 (L) 05/31/2016 1024   CO2 23 05/31/2016 1024   BUN 17 05/31/2016 1024   CREATININE 1.23 05/31/2016 1024   CREATININE 0.95 11/03/2015 1452      Component Value Date/Time   CALCIUM 8.7 (L) 05/31/2016 1024   ALKPHOS 57 05/31/2016 1024   AST 33 05/31/2016 1024   ALT 24 05/31/2016 1024   BILITOT 0.7 05/31/2016 1024        PENDING LABS:   RADIOGRAPHIC STUDIES:  No results found.   PATHOLOGY:    ASSESSMENT AND PLAN:  Adenocarcinoma of right lung, stage 3 (HCC) Stage IIIA ([id number]) adenocarcinoma of right lung, S/P R VATS, RML lobectomy, and mediastinal lymph node dissection by Dr. Roxan Hockey on 12/30/2015.  On adjuvant systemic chemotherapy consisting of  Carboplatin/Alimta beginning on 02/16/2016- 05/31/2016.  Oncology history is updated.  Pre-treatment labs: CBC diff, CMET.  I personally reviewed and went over laboratory results with the patient.  The results are noted within this dictation.  Hyperglycemia is noted at 293 today.  Order placed for 6 units of insulin.  Treatment #6 today.  Referral to Radiation Oncology for sequential XRT.  We have sent referral to [facility].  I have messaged Dr. Tammi Klippel to update him as well regarding the patient's treatment status and referral to Radiation Oncology.  Return in ~ 5 weeks for follow-up.  B12 deficiency B12 deficiency requiring IM B12 replacement.  Patient has opted to continue with IM B12 replacement monthly.  B12 injection next week.  Monthly B12 injection thereafter.   ORDERS PLACED FOR THIS ENCOUNTER: No orders of the defined types were placed in this encounter.   MEDICATIONS PRESCRIBED THIS ENCOUNTER: No orders of the defined types were placed in this encounter.   THERAPY PLAN:  Completed treatment today and we will refer him to XRT for treatment.  We will continue with monthly B12 injections.   All questions were answered. The patient knows to call the clinic with any problems, questions or concerns. We can certainly see the patient much sooner if necessary.  Patient and plan discussed with Dr. Ancil Linsey and she is in agreement with the aforementioned.   This note is electronically signed by: Doy Mince 05/31/2016 12:28 PM

## 2016-05-31 NOTE — Progress Notes (Signed)
[name] presents today for injection per MD orders. B12 1033mg administered IM in left Upper Arm. Administration without incident. Patient tolerated well. Patient tolerated infusion well.  VSS.  Patient ambulatory with walker and stable upon discharge from clinic.

## 2016-06-01 ENCOUNTER — Telehealth (HOSPITAL_COMMUNITY): Payer: Self-pay | Admitting: Oncology

## 2016-06-01 NOTE — Telephone Encounter (Signed)
FAXED [address]

## 2016-06-07 ENCOUNTER — Telehealth (HOSPITAL_COMMUNITY): Payer: Self-pay | Admitting: *Deleted

## 2016-06-12 ENCOUNTER — Encounter (HOSPITAL_COMMUNITY): Payer: Medicare Other | Attending: Hematology & Oncology

## 2016-06-12 VITALS — BP 141/60 | HR 87 | Temp 97.8°F | Resp 18

## 2016-06-12 DIAGNOSIS — R918 Other nonspecific abnormal finding of lung field: Secondary | ICD-10-CM | POA: Insufficient documentation

## 2016-06-12 DIAGNOSIS — D649 Anemia, unspecified: Secondary | ICD-10-CM | POA: Insufficient documentation

## 2016-06-12 DIAGNOSIS — E538 Deficiency of other specified B group vitamins: Secondary | ICD-10-CM | POA: Diagnosis present

## 2016-06-12 MED ORDER — CYANOCOBALAMIN 1000 MCG/ML IJ SOLN
1000.0000 ug | Freq: Once | INTRAMUSCULAR | Status: AC
  Administered 2016-06-12: 1000 ug via INTRAMUSCULAR
  Filled 2016-06-12: qty 1

## 2016-06-12 NOTE — Progress Notes (Signed)
[name] [name] tolerated Vit B12 injection today without complaints or incident. VSS Pt discharged self ambulatory using cane in satisfactory condition accompanied by his wife

## 2016-06-12 NOTE — Patient Instructions (Signed)
[facility] at [facility] Discharge Instructions  RECOMMENDATIONS MADE BY THE CONSULTANT AND ANY TEST RESULTS WILL BE SENT TO YOUR REFERRING PHYSICIAN.  Received Vit B12 injection today. Follow-up as scheduled. Call clinic for any questions or concerns  Thank you for choosing [facility] at [facility] to provide your oncology and hematology care.  To afford each patient quality time with our provider, please arrive at least 15 minutes before your scheduled appointment time.    If you have a lab appointment with the Cancer Center please come in thru the  Main Entrance and check in at the main information desk  You need to re-schedule your appointment should you arrive 10 or more minutes late.  We strive to give you quality time with our providers, and arriving late affects you and other patients whose appointments are after yours.  Also, if you no show three or more times for appointments you may be dismissed from the clinic at the providers discretion.     Again, thank you for choosing [facility].  Our hope is that these requests will decrease the amount of time that you wait before being seen by our physicians.       _____________________________________________________________  Should you have questions after your visit to [facility], please contact our office at [phone] between the hours of 8:30 a.m. and 4:30 p.m.  Voicemails left after 4:30 p.m. will not be returned until the following business day.  For prescription refill requests, have your pharmacy contact our office.       Resources For Cancer Patients and their Caregivers ? American Cancer Society: Can assist with transportation, wigs, general needs, runs Look Good Feel Better.        1-888-227-6333 ? Cancer Care: Provides financial assistance, online support groups, medication/co-pay assistance.  1-800-813-HOPE (4673) ? [facility] Assists [place] cancer patients and their families through emotional , educational and financial support.  336-427-4357 ? [place] DSS Where to apply for food stamps, Medicaid and utility assistance. 336-342-1394 ? RCATS: Transportation to medical appointments. 336-347-2287 ? Social Security Administration: May apply for disability if have a Stage IV cancer. [phone] 1-800-772-1213 ? [place] Aging, Disability and Transit Services: Assists with nutrition, care and transit needs. [phone]  Cancer Center Support Programs: @10RELATIVEDAYS@ > Cancer Support Group  2nd Tuesday of the month 1pm-2pm, Journey Room  > Creative Journey  3rd Tuesday of the month 1130am-1pm, Journey Room  > Look Good Feel Better  1st Wednesday of the month 10am-12 noon, Journey Room (Call American Cancer Society to register [phone])   

## 2016-07-05 ENCOUNTER — Ambulatory Visit (HOSPITAL_COMMUNITY): Payer: Medicare Other | Admitting: Oncology

## 2016-07-13 ENCOUNTER — Ambulatory Visit (HOSPITAL_COMMUNITY): Payer: Medicare Other | Admitting: Oncology

## 2016-07-13 ENCOUNTER — Ambulatory Visit (HOSPITAL_COMMUNITY): Payer: Medicare Other

## 2016-07-16 ENCOUNTER — Encounter (HOSPITAL_COMMUNITY): Payer: Self-pay | Admitting: Oncology

## 2016-07-16 ENCOUNTER — Encounter (HOSPITAL_BASED_OUTPATIENT_CLINIC_OR_DEPARTMENT_OTHER): Payer: Medicare Other

## 2016-07-16 ENCOUNTER — Encounter (HOSPITAL_COMMUNITY): Payer: Medicare Other | Attending: Hematology & Oncology | Admitting: Oncology

## 2016-07-16 DIAGNOSIS — G629 Polyneuropathy, unspecified: Secondary | ICD-10-CM | POA: Diagnosis not present

## 2016-07-16 DIAGNOSIS — C342 Malignant neoplasm of middle lobe, bronchus or lung: Secondary | ICD-10-CM

## 2016-07-16 DIAGNOSIS — R918 Other nonspecific abnormal finding of lung field: Secondary | ICD-10-CM | POA: Insufficient documentation

## 2016-07-16 DIAGNOSIS — Z95828 Presence of other vascular implants and grafts: Secondary | ICD-10-CM

## 2016-07-16 DIAGNOSIS — E538 Deficiency of other specified B group vitamins: Secondary | ICD-10-CM

## 2016-07-16 DIAGNOSIS — C3491 Malignant neoplasm of unspecified part of right bronchus or lung: Secondary | ICD-10-CM

## 2016-07-16 DIAGNOSIS — D649 Anemia, unspecified: Secondary | ICD-10-CM | POA: Insufficient documentation

## 2016-07-16 MED ORDER — HEPARIN SOD (PORK) LOCK FLUSH 100 UNIT/ML IV SOLN
INTRAVENOUS | Status: AC
  Filled 2016-07-16: qty 5

## 2016-07-16 MED ORDER — CYANOCOBALAMIN 1000 MCG/ML IJ SOLN
1000.0000 ug | Freq: Once | INTRAMUSCULAR | Status: AC
  Administered 2016-07-16: 1000 ug via INTRAMUSCULAR
  Filled 2016-07-16: qty 1

## 2016-07-16 MED ORDER — HEPARIN SOD (PORK) LOCK FLUSH 100 UNIT/ML IV SOLN
500.0000 [IU] | Freq: Once | INTRAVENOUS | Status: AC
  Administered 2016-07-16: 500 [IU] via INTRAVENOUS

## 2016-07-16 MED ORDER — SODIUM CHLORIDE 0.9% FLUSH
10.0000 mL | INTRAVENOUS | Status: DC | PRN
  Administered 2016-07-16: 10 mL via INTRAVENOUS
  Filled 2016-07-16: qty 10

## 2016-07-16 MED ORDER — AZITHROMYCIN 250 MG PO TABS: ORAL_TABLET | ORAL | 0 refills | Status: DC

## 2016-07-16 NOTE — Patient Instructions (Signed)
[place] at [facility] Discharge Instructions  RECOMMENDATIONS MADE BY THE CONSULTANT AND ANY TEST RESULTS WILL BE SENT TO YOUR REFERRING PHYSICIAN.  Received Vit B12 injection and port flush today. Follow-up as scheduled. Call clinic for any questions or concerns  Thank you for choosing [place] at [facility] to provide your oncology and hematology care.  To afford each patient quality time with our provider, please arrive at least 15 minutes before your scheduled appointment time.    If you have a lab appointment with the [facility] please come in thru the  Main Entrance and check in at the main information desk  You need to re-schedule your appointment should you arrive 10 or more minutes late.  We strive to give you quality time with our providers, and arriving late affects you and other patients whose appointments are after yours.  Also, if you no show three or more times for appointments you may be dismissed from the clinic at the providers discretion.     Again, thank you for choosing [facility].  Our hope is that these requests will decrease the amount of time that you wait before being seen by our physicians.       _____________________________________________________________  Should you have questions after your visit to [facility], please contact our office at [phone] between the hours of 8:30 a.m. and 4:30 p.m.  Voicemails left after 4:30 p.m. will not be returned until the following business day.  For prescription refill requests, have your pharmacy contact our office.       Resources For Cancer Patients and their Caregivers ? American Cancer Society: Can assist with transportation, wigs, general needs, runs Look Good Feel Better.        801-539-5426 ? Cancer Care: Provides financial assistance, online support groups, medication/co-pay assistance.  1-800-813-HOPE 219-828-1665) ? [place] Assists [place] cancer patients and their families through emotional , educational and financial support.  603-134-3732 ? [place] DSS Where to apply for food stamps, Medicaid and utility assistance. 443-465-9236 ? RC[employer]: Transportation to medical appointments. 915-119-6098 ? Social Security Administration: May apply for disability if have a Stage IV cancer. ([phone] [phone] ? [employer], Disability and Transit Services: Assists with nutrition, care and transit needs. [place] Support Programs: '@10RELATIVEDAYS'$ @ > Cancer Support Group  2nd Tuesday of the month 1pm-2pm, Journey Room  > Creative Journey  3rd Tuesday of the month 1130am-1pm, Journey Room  > Look Good Feel Better  1st Wednesday of the month 10am-12 noon, Journey Room (Call [place] to register [phone])

## 2016-07-16 NOTE — Progress Notes (Signed)
Tyr [name] tolerated Vit B12 injection and portacath flush well without complaints or incident. Port accessed with 20 gauge needle with blood return noted. Port flushed with 10 ml NS and 5 ml Heparin easily per protocol. Pt discharged self ambulatory using walker in satisfactory condition accompanied by his wife

## 2016-07-16 NOTE — Assessment & Plan Note (Addendum)
Stage IIIA ([id number]) adenocarcinoma of right lung, S/P R VATS, RML lobectomy, and mediastinal lymph node dissection by Dr. Roxan Hockey on 12/30/2015.  On adjuvant systemic chemotherapy consisting of Carboplatin/Alimta beginning on 02/16/2016- 05/31/2016.  Oncology history is updated.  No role for labs today.  I have reviewed the new NCCN guidelines pertaining to the new recommendation for Durvalumab in non-resectable Stage III NSCLC for consolidative therapy.  He seems to favor this intervention given the data that he is provided from the [place] [facility]. We will discuss in the near future following completion of radiation therapy.  Port flush today.  B12 injection today.  He reports significant improvement in his peripheral neuropathy with B12 injections. We'll continue this monthly.  Return in ~ 3 weeks to further discuss consolidative treatment with [facility].

## 2016-07-16 NOTE — Patient Instructions (Signed)
[place] at [facility] Discharge Instructions  RECOMMENDATIONS MADE BY THE CONSULTANT AND ANY TEST RESULTS WILL BE SENT TO YOUR REFERRING PHYSICIAN.  You were seen today by [name], PA-C You received your B-12 shot today and will be scheduled for that injection monthly Follow up in 3 weeks.  Thank you for choosing [place] at [facility] to provide your oncology and hematology care.  To afford each patient quality time with our provider, please arrive at least 15 minutes before your scheduled appointment time.    If you have a lab appointment with the [facility] please come in thru the  Main Entrance and check in at the main information desk  You need to re-schedule your appointment should you arrive 10 or more minutes late.  We strive to give you quality time with our providers, and arriving late affects you and other patients whose appointments are after yours.  Also, if you no show three or more times for appointments you may be dismissed from the clinic at the providers discretion.     Again, thank you for choosing [facility].  Our hope is that these requests will decrease the amount of time that you wait before being seen by our physicians.       _____________________________________________________________  Should you have questions after your visit to [facility], please contact our office at [phone] between the hours of 8:30 a.m. and 4:30 p.m.  Voicemails left after 4:30 p.m. will not be returned until the following business day.  For prescription refill requests, have your pharmacy contact our office.       Resources For Cancer Patients and their Caregivers ? American Cancer Society: Can assist with transportation, wigs, general needs, runs Look Good Feel Better.        229-871-7750 ? Cancer Care: Provides financial assistance, online support groups, medication/co-pay assistance.   1-800-813-HOPE 819 619 9199) ? [place] Assists [place] cancer patients and their families through emotional , educational and financial support.  812-434-7687 ? [place] DSS Where to apply for food stamps, Medicaid and utility assistance. 385-770-6931 ? RCATS: Transportation to medical appointments. 8382942335 ? Social Security Administration: May apply for disability if have a Stage IV cancer. 470-141-6808 (562) [phone] ? [employer], Disability and Transit Services: Assists with nutrition, care and transit needs. [place] Support Programs: '@10RELATIVEDAYS'$ @ > Cancer Support Group  2nd Tuesday of the month 1pm-2pm, Journey Room  > Creative Journey  3rd Tuesday of the month 1130am-1pm, Journey Room  > Look Good Feel Better  1st Wednesday of the month 10am-12 noon, Journey Room (Call [place] to register [phone])

## 2016-07-16 NOTE — Progress Notes (Signed)
[facility] [place], [place] [address]  Adenocarcinoma of right lung, stage 3 ([place]) - Plan: azithromycin (ZITHROMAX) 250 MG tablet  CURRENT THERAPY: XRT.  B12 monthly.  INTERVAL HISTORY: [name] 83 y.o. male returns for followup of Stage IIIA (T1AN2M0) adenocarcinoma of right lung, S/P R VATS, RML lobectomy, and mediastinal lymph node dissection by Dr. Roxan Hockey on 12/30/2015.  He is S/P 6 cycles of Carboplatin/Alimta 02/16/2016- 05/31/2016).      Adenocarcinoma of right lung, stage 3 (HCC)   11/10/2015 Imaging    13 mm spiculated lesion seen in right middle lobe.      11/30/2015 PET scan    Spiculated right middle lobe nodule is mildly hypermetabolic and most consistent with a stage IA adenocarcinoma.      12/30/2015 Surgery    Right middle lobectomy and node dissection      12/30/2015 Procedure    Right video-assisted thoracoscopy, Thoracoscopic right middle lobectomy, Mediastinal lymph node dissection, and On-Q local anesthetic catheter placement.      01/02/2016 Pathology Results    1. Lung, resection (segmental or lobe), Right Middle Lobe - INVASIVE ADENOCARCINOMA, WELL DIFFERENTIATED, SPANNING 1.2 CM. - THE SURGICAL RESECTION MARGINS ARE NEGATIVE FOR CARCINOMA. 2. Lymph node, biopsy, Level 12 - METASTATIC CARCINOMA IN 1 OF 1 LYMPH NODE (1/1). 3. Lymph node, biopsy, Level 12 #2 - METASTATIC CARCINOMA IN 1 OF 1 LYMPH NODE (1/1). 4. Lymph node, biopsy, Level 7 - METASTATIC CARCINOMA IN 1 OF 1 LYMPH NODE (1/1/). 5. Lymph node, biopsy, Level 7 #2 - METASTATIC CARCINOMA IN 1 OF 1 LYMPH NODE (1/1). 6. Lymph node, biopsy, Level 7 #3 - METASTATIC CARCINOMA IN 1 OF 1 LYMPH NODE (1/1). 7. Lymph node, biopsy, Level 7 #4 - METASTATIC CARCINOMA IN 1 OF 1 LYMPH NODE (1/1). 8. Lymph node, biopsy, 4R - THERE IS NO EVIDENCE OF CARCINOMA IN 1 OF 1 LYMPH NODE (0/1). 9. Lymph node, biopsy, 4R #2 - THERE IS NO EVIDENCE OF CARCINOMA IN 1 OF 1 LYMPH NODE (0/1).      01/05/2016 Pathology Results    PDL1 NEGATIVE- tumor proportion score of 0%.       01/05/2016 Pathology Results    Genomic alterations identified: BRAF V600E, KIT amplification, PDGFRA amplification, CDKN2A/B loss, TP53 S71f*33.  Additional findings: MSI-STABLE.  No reportable alterations identified: EGFR, KRAS, ALK, MET, RET, ERBB2, ROS1      02/09/2016 Procedure    Port placed by IR.      02/16/2016 - 05/31/2016 Chemotherapy    Carboplatin/Pemetrexed x 6 cycles        Radiation Therapy    Finishing ~ 08/02/2016       He is tolerating radiation therapy well without any skin complaints or increase in fatigue. He notes that he has 13 more treatments left.  He wished to continue with B12 injections with I think is reasonable. He notes that this isn't very helpful for his peripheral neuropathy.  I utilize this opportunity talk to him about consolidative treatment with immunotherapy. Given his stage III disease, he certainly is a candidate for a years worth of Imfinzi.  I reviewed the [facility] study from November and the data available from that regarding this treatment intervention. Reviewed the risks, benefits, alternatives, and side effects of Imfinzi, including, but not limited to, diarrhea, constipation, abdominal pain, severe fatigue, change in liver tests.  Review of Systems  Constitutional: Negative.  Negative for chills, fever  and weight loss.  HENT: Negative.   Eyes: Negative.   Respiratory: Negative.  Negative for cough, sputum production and shortness of breath.   Cardiovascular: Negative for chest pain.  Gastrointestinal: Negative.  Negative for constipation, diarrhea, nausea and vomiting.  Genitourinary: Negative.   Musculoskeletal: Positive for back pain (chronic and at baseline).  Skin: Negative.   Neurological: Negative.  Negative for weakness.  Endo/Heme/Allergies: Negative.   Psychiatric/Behavioral: Negative.     Past Medical History:    Diagnosis Date  . Adenocarcinoma of lung, stage 3 (HCC)    Stage IIIA  . Adenocarcinoma of right lung, stage 3 ([place]) 12/02/2015  . Arthritis   . Atrial fibrillation, transient ([place])    transient postop, < 24 hours  . B12 deficiency   . Cyst of scrotum   . Diverticulitis   . GERD (gastroesophageal reflux disease)   . Hernia   . History of pneumonia   . Hyperlipidemia   . Lung nodule    Spiculated right middle lobe nodule, hypermetabolic  . Type 2 diabetes mellitus ([place])     Past Surgical History:  Procedure Laterality Date  . BACK SURGERY    . CHOLECYSTECTOMY    . COLONOSCOPY N/A 11/24/2015   Procedure: COLONOSCOPY;  Surgeon: Rogene Houston, MD;  Location: [facility];  Service: Endoscopy;  Laterality: N/A;  730  . COLONOSCOPY W/ POLYPECTOMY     x 5  . CYST EXCISION     Scrotum  . EYE SURGERY Bilateral    Cataract with Lens  . HERNIA REPAIR Right    Inguinal  . IR GENERIC HISTORICAL  02/09/2016   IR US GUIDE VASC ACCESS RIGHT 02/09/2016 Arne Cleveland, MD [facility]INTER[facility]  . IR GENERIC HISTORICAL  02/09/2016   IR FLUORO GUIDE CV LINE RIGHT 02/09/2016 Arne Cleveland, MD [facility]  . LUMBAR DISC SURGERY     per patient report  . VIDEO ASSISTED THORACOSCOPY (VATS)/ LOBECTOMY Right 12/30/2015   Procedure: VIDEO ASSISTED THORACOSCOPY (VATS)/RIGHT MIDDLE LOBECTOMY;  Surgeon: Melrose Nakayama, MD;  Location: [facility];  Service: Thoracic;  Laterality: Right;    Family History  Problem Relation Age of Onset  . Colon cancer Brother     Social History   Social History  . Marital status: Married    Spouse name: N/A  . Number of children: N/A  . Years of education: N/A   Social History Main Topics  . Smoking status: Former Smoker    Types: Cigarettes    Quit date: 05/27/2004  . Smokeless tobacco: Never Used  . Alcohol use No  . Drug use: No  . Sexual activity: Not Asked     Comment: married   Other Topics Concern  . None   Social History Narrative  . None      PHYSICAL EXAMINATION  ECOG PERFORMANCE STATUS: 1 - Symptomatic but completely ambulatory  Vitals:   07/16/16 1510  BP: 136/61  Pulse: 93  Resp: 18  Temp: 97.8 F (36.6 C)     GENERAL:alert, no distress, well nourished, well developed, comfortable, cooperative, obese, smiling and accompanied by his wife. SKIN: skin color, texture, turgor are normal, no rashes or significant lesions HEAD: Normocephalic, No masses, lesions, tenderness or abnormalities EYES: normal, EOMI, Conjunctiva are pink and non-injected EARS: External ears normal OROPHARYNX:lips, buccal mucosa, and tongue normal  NECK: supple, trachea midline LYMPH:  not examined BREAST:not examined LUNGS: clear to auscultation  HEART: regular rate & rhythm ABDOMEN:abdomen soft, non-tender, obese and normal  bowel sounds BACK: Back symmetric, no curvature. EXTREMITIES:less then 2 second capillary refill, no joint deformities, effusion, or inflammation, no skin discoloration, no clubbing, no cyanosis. NEURO: alert & oriented x 3 with fluent speech, no focal motor/sensory deficits, gait normal   LABORATORY DATA: CBC    Component Value Date/Time   WBC 11.1 (H) 05/31/2016 1024   RBC 3.48 (L) 05/31/2016 1024   HGB 10.6 (L) 05/31/2016 1024   HCT 30.6 (L) 05/31/2016 1024   PLT 345 05/31/2016 1024   MCV 87.9 05/31/2016 1024   MCH 30.5 05/31/2016 1024   MCHC 34.6 05/31/2016 1024   RDW 14.6 05/31/2016 1024   LYMPHSABS 1.3 05/31/2016 1024   MONOABS 0.9 05/31/2016 1024   EOSABS 0.0 05/31/2016 1024   BASOSABS 0.0 05/31/2016 1024      Chemistry      Component Value Date/Time   NA 134 (L) 05/31/2016 1024   K 3.9 05/31/2016 1024   CL 100 (L) 05/31/2016 1024   CO2 23 05/31/2016 1024   BUN 17 05/31/2016 1024   CREATININE 1.23 05/31/2016 1024   CREATININE 0.95 11/03/2015 1452      Component Value Date/Time   CALCIUM 8.7 (L) 05/31/2016 1024   ALKPHOS 57 05/31/2016 1024   AST 33 05/31/2016 1024   ALT 24 05/31/2016 1024    BILITOT 0.7 05/31/2016 1024        PENDING LABS:   RADIOGRAPHIC STUDIES:  No results found.   PATHOLOGY:    ASSESSMENT AND PLAN:  Adenocarcinoma of right lung, stage 3 (HCC) Stage IIIA ([id number]) adenocarcinoma of right lung, S/P R VATS, RML lobectomy, and mediastinal lymph node dissection by Dr. Roxan Hockey on 12/30/2015.  On adjuvant systemic chemotherapy consisting of Carboplatin/Alimta beginning on 02/16/2016- 05/31/2016.  Oncology history is updated.  No role for labs today.  I have reviewed the new NCCN guidelines pertaining to the new recommendation for Durvalumab in non-resectable Stage III NSCLC for consolidative therapy.  He seems to favor this intervention given the data that he is provided from the [place] [facility]. We will discuss in the near future following completion of radiation therapy.  Port flush today.  B12 injection today.  He reports significant improvement in his peripheral neuropathy with B12 injections. We'll continue this monthly.  Return in ~ 3 weeks to further discuss consolidative treatment with Imfinzi.   ORDERS PLACED FOR THIS ENCOUNTER: No orders of the defined types were placed in this encounter.   MEDICATIONS PRESCRIBED THIS ENCOUNTER: Meds ordered this encounter  Medications  . azithromycin (ZITHROMAX) 250 MG tablet    Sig: Take 2 tablets on day 1 and then daily thereafter until RX is complete    Dispense:  6 each    Refill:  0    Order Specific Question:   Supervising Provider    Answer:   Brunetta Genera [[id number]]    THERAPY PLAN:  Complete XRT.  We'll plan on embarking on consolidative therapy consisting of Imfinzi x 52 weeks.  We will continue with monthly B12 injections.  All questions were answered. The patient knows to call the clinic with any problems, questions or concerns. We can certainly see the patient much sooner if necessary.  Patient and plan discussed with Dr. Twana First and she is in  agreement with the aforementioned.   This note is electronically signed by: Doy Mince 07/16/2016 7:29 PM

## 2016-08-07 ENCOUNTER — Encounter (HOSPITAL_BASED_OUTPATIENT_CLINIC_OR_DEPARTMENT_OTHER): Payer: Medicare Other | Admitting: Oncology

## 2016-08-07 ENCOUNTER — Encounter (HOSPITAL_COMMUNITY): Payer: Self-pay | Admitting: Oncology

## 2016-08-07 DIAGNOSIS — C342 Malignant neoplasm of middle lobe, bronchus or lung: Secondary | ICD-10-CM | POA: Diagnosis present

## 2016-08-07 DIAGNOSIS — G629 Polyneuropathy, unspecified: Secondary | ICD-10-CM | POA: Diagnosis not present

## 2016-08-07 DIAGNOSIS — C3491 Malignant neoplasm of unspecified part of right bronchus or lung: Secondary | ICD-10-CM

## 2016-08-07 DIAGNOSIS — R05 Cough: Secondary | ICD-10-CM

## 2016-08-07 NOTE — Patient Instructions (Signed)
[place] at [facility] Discharge Instructions  RECOMMENDATIONS MADE BY THE CONSULTANT AND ANY TEST RESULTS WILL BE SENT TO YOUR REFERRING PHYSICIAN.  You were seen today by [name] PA-C. Stop taking Folic Acid. Diarrhea sheet given. Take Delsym for cough. CT scan in 3 weeks. Chemo teaching to be scheduled. Start Imfinzi in 4 weeks.  Return in 5 weeks.   Thank you for choosing [place] at [facility] to provide your oncology and hematology care.  To afford each patient quality time with our provider, please arrive at least 15 minutes before your scheduled appointment time.    If you have a lab appointment with the [facility] please come in thru the  Main Entrance and check in at the main information desk  You need to re-schedule your appointment should you arrive 10 or more minutes late.  We strive to give you quality time with our providers, and arriving late affects you and other patients whose appointments are after yours.  Also, if you no show three or more times for appointments you may be dismissed from the clinic at the providers discretion.     Again, thank you for choosing [facility].  Our hope is that these requests will decrease the amount of time that you wait before being seen by our physicians.       _____________________________________________________________  Should you have questions after your visit to [facility], please contact our office at [phone] between the hours of 8:30 a.m. and 4:30 p.m.  Voicemails left after 4:30 p.m. will not be returned until the following business day.  For prescription refill requests, have your pharmacy contact our office.       Resources For Cancer Patients and their Caregivers ? American Cancer Society: Can assist with transportation, wigs, general needs, runs Look Good Feel Better.        ([phone] ? Cancer Care: Provides financial  assistance, online support groups, medication/co-pay assistance.  1-800-813-HOPE 915-732-1995) ? [place] Assists [place] cancer patients and their families through emotional , educational and financial support.  626 047 2542 ? [place] DSS Where to apply for food stamps, Medicaid and utility assistance. 269-278-2005 ? RCATS: Transportation to medical appointments. 9050575784 ? Social Security Administration: May apply for disability if have a Stage IV cancer. [phone] ? [employer], Disability and Transit Services: Assists with nutrition, care and transit needs. [place] Support Programs: '@10RELATIVEDAYS'$ @ > Cancer Support Group  2nd Tuesday of the month 1pm-2pm, Journey Room  > Creative Journey  3rd Tuesday of the month 1130am-1pm, Journey Room  > Look Good Feel Better  1st Wednesday of the month 10am-12 noon, Journey Room (Call [place] to register [phone])

## 2016-08-07 NOTE — Progress Notes (Signed)
DISCONTINUE OFF PATHWAY REGIMEN - Non-Small Cell Lung  No Medical Intervention - Off Treatment.  REASON: Other Reason PRIOR TREATMENT: Off Treatment  START OFF PATHWAY REGIMEN - Non-Small Cell Lung   [id number]:Durvalumab 10 mg/kg q14 Days:   A cycle is every 14 days:     Durvalumab        Dose Mod: None  **Always confirm dose/schedule in your pharmacy ordering system**    Patient Characteristics: Stage III - Unresectable, PS = 0, 1 AJCC T Category: T1a Current Disease Status: No Distant Mets or Local Recurrence AJCC N Category: N2 AJCC M Category: M0 AJCC 8 Stage Grouping: IIIA Performance Status: PS = 0, 1  Intent of Therapy: Curative Intent, Discussed with Patient

## 2016-08-07 NOTE — Progress Notes (Signed)
DISCONTINUE ON PATHWAY REGIMEN - Non-Small Cell Lung     A cycle is every 21 days:     Pemetrexed        Dose Mod: None     Carboplatin        Dose Mod: None  **Always confirm dose/schedule in your pharmacy ordering system**    REASON: Maximum Response Achieved PRIOR TREATMENT: [id number]: Carboplatin AUC=5 + Pemetrexed 500 mg/m2 q21 Days x 4 Cycles TREATMENT RESPONSE: Complete Response (CR)  Non-Small Cell Lung - No Medical Intervention - Off Treatment.  Patient Characteristics: Stage IIIA - Resected (Adjuvant), No Prior Chemotherapy, Non Squamous Cell AJCC M Stage: X AJCC N Stage: X AJCC T Stage: X Current Disease Status: No Distant Metastases or Local Recurrence AJCC Stage Grouping: IIIA Histology: Non Squamous Cell

## 2016-08-07 NOTE — Progress Notes (Signed)
[name], [place] [address]  Adenocarcinoma of right lung, stage 3 ([place]) - Plan: CT Abdomen Pelvis W Contrast, CT Chest W Contrast, CBC with Differential, Comprehensive metabolic panel, TSH  CURRENT THERAPY: XRT.  B12 monthly.  INTERVAL HISTORY: [name] 83 y.o. male returns for followup of Stage IIIA ([id number]) adenocarcinoma of right lung, S/P R VATS, RML lobectomy, and mediastinal lymph node dissection by Dr. Roxan Hockey on 12/30/2015.  He is S/P 6 cycles of Carboplatin/Alimta 02/16/2016- 05/31/2016).      Adenocarcinoma of right lung, stage 3 (HCC)   11/10/2015 Imaging    13 mm spiculated lesion seen in right middle lobe.      11/30/2015 PET scan    Spiculated right middle lobe nodule is mildly hypermetabolic and most consistent with a stage IA adenocarcinoma.      12/30/2015 Surgery    Right middle lobectomy and node dissection      12/30/2015 Procedure    Right video-assisted thoracoscopy, Thoracoscopic right middle lobectomy, Mediastinal lymph node dissection, and On-Q local anesthetic catheter placement.      01/02/2016 Pathology Results    1. Lung, resection (segmental or lobe), Right Middle Lobe - INVASIVE ADENOCARCINOMA, WELL DIFFERENTIATED, SPANNING 1.2 CM. - THE SURGICAL RESECTION MARGINS ARE NEGATIVE FOR CARCINOMA. 2. Lymph node, biopsy, Level 12 - METASTATIC CARCINOMA IN 1 OF 1 LYMPH NODE (1/1). 3. Lymph node, biopsy, Level 12 #2 - METASTATIC CARCINOMA IN 1 OF 1 LYMPH NODE (1/1). 4. Lymph node, biopsy, Level 7 - METASTATIC CARCINOMA IN 1 OF 1 LYMPH NODE (1/1/). 5. Lymph node, biopsy, Level 7 #2 - METASTATIC CARCINOMA IN 1 OF 1 LYMPH NODE (1/1). 6. Lymph node, biopsy, Level 7 #3 - METASTATIC CARCINOMA IN 1 OF 1 LYMPH NODE (1/1). 7. Lymph node, biopsy, Level 7 #4 - METASTATIC CARCINOMA IN 1 OF 1 LYMPH NODE (1/1). 8. Lymph node, biopsy, 4R - THERE IS NO EVIDENCE OF CARCINOMA IN 1 OF 1 LYMPH NODE (0/1). 9. Lymph node, biopsy, 4R  #2 - THERE IS NO EVIDENCE OF CARCINOMA IN 1 OF 1 LYMPH NODE (0/1).      01/05/2016 Pathology Results    PDL1 NEGATIVE- tumor proportion score of 0%.       01/05/2016 Pathology Results    Genomic alterations identified: BRAF V600E, KIT amplification, PDGFRA amplification, CDKN2A/B loss, TP53 S46f*33.  Additional findings: MSI-STABLE.  No reportable alterations identified: EGFR, KRAS, ALK, MET, RET, ERBB2, ROS1      02/09/2016 Procedure    Port placed by IR.      02/16/2016 - 05/31/2016 Chemotherapy    Carboplatin/Pemetrexed x 6 cycles        Radiation Therapy    Finishing ~ 08/01/2016       He wished to continue with B12 injections with I think is reasonable. He notes that this isn't very helpful for his peripheral neuropathy.  I utilize this opportunity talk to him about consolidative treatment with immunotherapy. Given his stage III disease, he certainly is a candidate for a years worth of Imfinzi.  I reviewed the [place] study from November and the data available from that regarding this treatment intervention. Reviewed the risks, benefits, alternatives, and side effects of Imfinzi, including, but not limited to, diarrhea, constipation, abdominal pain, severe fatigue, change in liver tests.  He reports pain with swallowing.  He has provided education regarding radiation-induced esophagitis.  His weight is stable.  He reports diarrhea as  well.  He is given a diarrhea sheet.  He notes a cough and this is likely secondary to treatment.  He is welcome to try over-the-counter antitussives.  Review of Systems  Constitutional: Negative.  Negative for chills, fever and weight loss.  HENT: Negative.   Eyes: Negative.   Respiratory: Negative.  Negative for cough.   Cardiovascular: Negative.  Negative for chest pain.  Gastrointestinal: Negative.  Negative for blood in stool, constipation, diarrhea, melena, nausea and vomiting.  Genitourinary: Negative.    Musculoskeletal: Positive for back pain (chronic, at baseline).  Skin: Negative.   Neurological: Negative.  Negative for weakness.  Endo/Heme/Allergies: Negative.   Psychiatric/Behavioral: Negative.     Past Medical History:  Diagnosis Date  . Adenocarcinoma of lung, stage 3 (HCC)    Stage IIIA  . Adenocarcinoma of right lung, stage 3 ([place]) 12/02/2015  . Arthritis   . Atrial fibrillation, transient ([place])    transient postop, < 24 hours  . B12 deficiency   . Cyst of scrotum   . Diverticulitis   . GERD (gastroesophageal reflux disease)   . Hernia   . History of pneumonia   . Hyperlipidemia   . Lung nodule    Spiculated right middle lobe nodule, hypermetabolic  . Type 2 diabetes mellitus ([place])     Past Surgical History:  Procedure Laterality Date  . BACK SURGERY    . CHOLECYSTECTOMY    . COLONOSCOPY N/A 11/24/2015   Procedure: COLONOSCOPY;  Surgeon: Rogene Houston, MD;  Location: [facility];  Service: Endoscopy;  Laterality: N/A;  730  . COLONOSCOPY W/ POLYPECTOMY     x 5  . CYST EXCISION     Scrotum  . EYE SURGERY Bilateral    Cataract with Lens  . HERNIA REPAIR Right    Inguinal  . IR GENERIC HISTORICAL  02/09/2016   IR US GUIDE VASC ACCESS RIGHT 02/09/2016 Arne Cleveland, MD [facility]  . IR GENERIC HISTORICAL  02/09/2016   IR FLUORO GUIDE CV LINE RIGHT 02/09/2016 Arne Cleveland, MD [facility]  . LUMBAR DISC SURGERY     per patient report  . VIDEO ASSISTED THORACOSCOPY (VATS)/ LOBECTOMY Right 12/30/2015   Procedure: VIDEO ASSISTED THORACOSCOPY (VATS)/RIGHT MIDDLE LOBECTOMY;  Surgeon: Melrose Nakayama, MD;  Location: [facility];  Service: Thoracic;  Laterality: Right;    Family History  Problem Relation Age of Onset  . Colon cancer Brother     Social History   Social History  . Marital status: Married    Spouse name: N/A  . Number of children: N/A  . Years of education: N/A   Social History Main Topics  . Smoking status: Former Smoker    Types:  Cigarettes    Quit date: 05/27/2004  . Smokeless tobacco: Never Used  . Alcohol use No  . Drug use: No  . Sexual activity: Not Asked     Comment: married   Other Topics Concern  . None   Social History Narrative  . None     PHYSICAL EXAMINATION  ECOG PERFORMANCE STATUS: 1 - Symptomatic but completely ambulatory  Vitals:   08/07/16 1020  BP: (!) 110/42  Pulse: (!) 101  Resp: 18  Temp: 99.1 F (37.3 C)    GENERAL:alert, no distress, well nourished, well developed, comfortable, cooperative, obese, smiling and accompanied by his wife. SKIN: skin color, texture, turgor are normal, no rashes or significant lesions HEAD: Normocephalic, No masses, lesions, tenderness or abnormalities EYES: normal, EOMI, Conjunctiva are pink  and non-injected EARS: External ears normal OROPHARYNX:lips, buccal mucosa, and tongue normal  NECK: supple, trachea midline LYMPH:  not examined BREAST:not examined LUNGS: clear to auscultation without wheezes, rales, or rhonchi HEART: regular rate & rhythm ABDOMEN:abdomen soft, non-tender, obese and normal bowel sounds BACK: Back symmetric, no curvature. EXTREMITIES:less then 2 second capillary refill, no joint deformities, effusion, or inflammation, no skin discoloration, no clubbing, no cyanosis. NEURO: alert & oriented x 3 with fluent speech, no focal motor/sensory deficits, gait normal   LABORATORY DATA: CBC    Component Value Date/Time   WBC 11.1 (H) 05/31/2016 1024   RBC 3.48 (L) 05/31/2016 1024   HGB 10.6 (L) 05/31/2016 1024   HCT 30.6 (L) 05/31/2016 1024   PLT 345 05/31/2016 1024   MCV 87.9 05/31/2016 1024   MCH 30.5 05/31/2016 1024   MCHC 34.6 05/31/2016 1024   RDW 14.6 05/31/2016 1024   LYMPHSABS 1.3 05/31/2016 1024   MONOABS 0.9 05/31/2016 1024   EOSABS 0.0 05/31/2016 1024   BASOSABS 0.0 05/31/2016 1024      Chemistry      Component Value Date/Time   NA 134 (L) 05/31/2016 1024   K 3.9 05/31/2016 1024   CL 100 (L) 05/31/2016  1024   CO2 23 05/31/2016 1024   BUN 17 05/31/2016 1024   CREATININE 1.23 05/31/2016 1024   CREATININE 0.95 11/03/2015 1452      Component Value Date/Time   CALCIUM 8.7 (L) 05/31/2016 1024   ALKPHOS 57 05/31/2016 1024   AST 33 05/31/2016 1024   ALT 24 05/31/2016 1024   BILITOT 0.7 05/31/2016 1024        PENDING LABS:   RADIOGRAPHIC STUDIES:  No results found.   PATHOLOGY:    ASSESSMENT AND PLAN:  Adenocarcinoma of right lung, stage 3 (HCC) Stage IIIA ([id number]) adenocarcinoma of right lung, S/P R VATS, RML lobectomy, and mediastinal lymph node dissection by Dr. Roxan Hockey on 12/30/2015.  On adjuvant systemic chemotherapy consisting of Carboplatin/Alimta beginning on 02/16/2016- 05/31/2016.  S/P XRT finishing on 08/01/2016.  Oncology history is updated.  No role for labs today.  He can discontinue his folic acid.  I provided him a diarrhea sheet.  He is welcome to utilize these over-the-counter interventions as diarrhea.  He is advised that once he starts immunotherapy, he is to not follow diarrhea sheet and if he has complications such as diarrhea, he is to contact us immediately or report to the emergency department.  He can take Delsym OTC for his cough.  I have reviewed the new NCCN guidelines pertaining to the new recommendation for Durvalumab in non-resectable Stage III NSCLC for consolidative therapy.  He seems to favor this intervention given the data that he is provided from the [place] [facility]. We will discuss in the near future following completion of radiation therapy.  Restaging CT CAP with contrast in ~3 week.  B12 injection as scheduled on 08/13/2016.  He reports significant improvement in his peripheral neuropathy with B12 injections. We'll continue this monthly.  Return in 4 weeks to start consolidative therapy with Imfinzi.  He will need immunotherapy teaching prior to start with consent.  Imfinzi immunotherapy plan is built via VIA  pathways.  Return in 5 weeks for "Nadir" check-up.   ORDERS PLACED FOR THIS ENCOUNTER: Orders Placed This Encounter  Procedures  . CT Abdomen Pelvis W Contrast  . CT Chest W Contrast  . CBC with Differential  . Comprehensive metabolic panel  . TSH  MEDICATIONS PRESCRIBED THIS ENCOUNTER: No orders of the defined types were placed in this encounter.   THERAPY PLAN:  We'll plan on embarking on consolidative therapy consisting of Imfinzi x 52 weeks.  We will continue with monthly B12 injections.  All questions were answered. The patient knows to call the clinic with any problems, questions or concerns. We can certainly see the patient much sooner if necessary.  Patient and plan discussed with Dr. Twana First and she is in agreement with the aforementioned.   This note is electronically signed by: Doy Mince 08/07/2016 11:02 AM

## 2016-08-07 NOTE — Assessment & Plan Note (Addendum)
Stage IIIA ([id number]) adenocarcinoma of right lung, S/P R VATS, RML lobectomy, and mediastinal lymph node dissection by Dr. Roxan Hockey on 12/30/2015.  On adjuvant systemic chemotherapy consisting of Carboplatin/Alimta beginning on 02/16/2016- 05/31/2016.  S/P XRT finishing on 08/01/2016.  Oncology history is updated.  No role for labs today.  He can discontinue his folic acid.  I provided him a diarrhea sheet.  He is welcome to utilize these over-the-counter interventions as diarrhea.  He is advised that once he starts immunotherapy, he is to not follow diarrhea sheet and if he has complications such as diarrhea, he is to contact us immediately or report to the emergency department.  He can take Delsym OTC for his cough.  I have reviewed the new NCCN guidelines pertaining to the new recommendation for Durvalumab in non-resectable Stage III NSCLC for consolidative therapy.  He seems to favor this intervention given the data that he is provided from the [place] [facility]. We will discuss in the near future following completion of radiation therapy.  Restaging CT CAP with contrast in ~3 week.  B12 injection as scheduled on 08/13/2016.  He reports significant improvement in his peripheral neuropathy with B12 injections. We'll continue this monthly.  Return in 4 weeks to start consolidative therapy with Imfinzi.  He will need immunotherapy teaching prior to start with consent.  Imfinzi immunotherapy plan is built via V[employer] pathways.  Return in 5 weeks for "Nadir" check-up.

## 2016-08-13 ENCOUNTER — Encounter (HOSPITAL_COMMUNITY): Payer: Self-pay

## 2016-08-13 ENCOUNTER — Encounter (HOSPITAL_COMMUNITY): Payer: Medicare Other | Attending: Hematology & Oncology

## 2016-08-13 VITALS — BP 112/42 | HR 97 | Temp 97.4°F | Resp 18

## 2016-08-13 DIAGNOSIS — E538 Deficiency of other specified B group vitamins: Secondary | ICD-10-CM

## 2016-08-13 DIAGNOSIS — R918 Other nonspecific abnormal finding of lung field: Secondary | ICD-10-CM | POA: Insufficient documentation

## 2016-08-13 DIAGNOSIS — D649 Anemia, unspecified: Secondary | ICD-10-CM | POA: Insufficient documentation

## 2016-08-13 MED ORDER — CYANOCOBALAMIN 1000 MCG/ML IJ SOLN
INTRAMUSCULAR | Status: AC
  Filled 2016-08-13: qty 1

## 2016-08-13 MED ORDER — CYANOCOBALAMIN 1000 MCG/ML IJ SOLN
1000.0000 ug | Freq: Once | INTRAMUSCULAR | Status: AC
  Administered 2016-08-13: 1000 ug via INTRAMUSCULAR

## 2016-08-13 NOTE — Progress Notes (Signed)
[name] tolerated Vit B12 injection well without complaints or incident. VSS Pt discharged self ambulatory in satisfactory condition accompanied by his wife

## 2016-08-13 NOTE — Patient Instructions (Signed)
[facility] at [facility] Discharge Instructions  RECOMMENDATIONS MADE BY THE CONSULTANT AND ANY TEST RESULTS WILL BE SENT TO YOUR REFERRING PHYSICIAN.  Received Vit B12 injection today. Follow-up as scheduled. Call clinic for any questions or concerns  Thank you for choosing [facility] at [facility] to provide your oncology and hematology care.  To afford each patient quality time with our provider, please arrive at least 15 minutes before your scheduled appointment time.    If you have a lab appointment with the Cancer Center please come in thru the  Main Entrance and check in at the main information desk  You need to re-schedule your appointment should you arrive 10 or more minutes late.  We strive to give you quality time with our providers, and arriving late affects you and other patients whose appointments are after yours.  Also, if you no show three or more times for appointments you may be dismissed from the clinic at the providers discretion.     Again, thank you for choosing [facility].  Our hope is that these requests will decrease the amount of time that you wait before being seen by our physicians.       _____________________________________________________________  Should you have questions after your visit to  Cancer Center, please contact our office at [phone] between the hours of 8:30 a.m. and 4:30 p.m.  Voicemails left after 4:30 p.m. will not be returned until the following business day.  For prescription refill requests, have your pharmacy contact our office.       Resources For Cancer Patients and their Caregivers ? American Cancer Society: Can assist with transportation, wigs, general needs, runs Look Good Feel Better.        1-888-227-6333 ? Cancer Care: Provides financial assistance, online support groups, medication/co-pay assistance.  1-800-813-HOPE (4673) ? [facility] Assists [place] cancer patients and their families through emotional , educational and financial support.  336-427-4357 ? [place] DSS Where to apply for food stamps, Medicaid and utility assistance. 336-342-1394 ? [facility]: Transportation to medical appointments. 336-347-2287 ? Social Security Administration: May apply for disability if have a Stage IV cancer. 336-342-7796 1-800-772-1213 ? [place] Aging, Disability and Transit Services: Assists with nutrition, care and transit needs. 336-349-2343  Cancer Center Support Programs: @10RELATIVEDAYS@ > Cancer Support Group  2nd Tuesday of the month 1pm-2pm, Journey Room  > Creative Journey  3rd Tuesday of the month 1130am-1pm, Journey Room  > Look Good Feel Better  1st Wednesday of the month 10am-12 noon, Journey Room (Call American Cancer Society to register 1-800-395-5775)   

## 2016-08-21 ENCOUNTER — Other Ambulatory Visit (HOSPITAL_COMMUNITY): Payer: Self-pay | Admitting: Oncology

## 2016-08-21 ENCOUNTER — Encounter (HOSPITAL_COMMUNITY): Payer: Self-pay | Admitting: Emergency Medicine

## 2016-08-21 NOTE — Patient Instructions (Signed)
[name]   CHEMOTHERAPY INSTRUCTIONS  You have Stage 3 lung Cancer.  We are going to treat you with Imfinzi.  This will be given every 3 weeks for the next year.  This infusion takes 1 hour to infuse.   You will see the doctor regularly throughout treatment.  We monitor your lab work prior to every treatment.  The doctor monitors your response to treatment by the way you are feeling, your blood work, and scans periodically.   POTENTIAL SIDE EFFECTS OF TREATMENT:  Durvalumab (Imfinzi)  About This Drug Durvalumab is used to treat cancer. This drug is given in the vein (IV).  Possible Side Effects . Tiredness . Muscle and bone pain . Constipation (not able to move bowels) . Decreased appetite (decreased hunger) . Nausea and throwing up (vomiting). . Swelling of your legs, ankles and/or feet . Urinary tract infection. Symptoms may include: . pain or burning when you pass urine . feeling like you have to pass urine often, but not much comes out when you do. . tender or heavy feeling in your lower abdomen . cloudy urine and/or urine that smells bad . pain on one side of your back under your ribs. This is where your kidneys are. . fever, chills, nausea and/or throwing up Note: Each of the side effects above was reported in 15% or greater of patients treated with durvalumab. Not all possible side effects are included above.  Warnings and Precautions . Inflammation (swelling) of the lungs. You may have a dry cough or trouble breathing. . Colitis which is swelling in the colon. The symptoms are loose bowel movements (diarrhea) stomach cramping, and sometimes blood in the bowel movements. . Changes in your liver function. . This drug may affect how your kidneys work . This drug may affect some of your hormone glands (especially the thyroid, adrenals, pituitary and pancreas). Your doctor may check your hormone levels as needed. . Increased risk of  infection. You may get a fever and chills. . While you are getting this drug in your vein (IV), you may have a reaction to the drug. Sometimes you may be given medication to stop or lessen these side effects. Your nurse will check you closely for these signs: fever or shaking chills, flushing, facial swelling, feeling dizzy, headache, trouble breathing, rash, itching, chest tightness, or chest pain. These reactions may happen for 24 hours after your infusion. If this happens, call 911 for emergency care.  Treating Side Effects . Manage tiredness by pacing your activities for the day. Be sure to include periods of rest between energy-draining activities . Keeping your pain under control is important to your well-being. Please tell your doctor or nurse if you are experiencing pain. . Ask your doctor or nurse about medicines that are available to help stop or lessen constipation. . If you are not able to move your bowels, check with your doctor or nurse before you use enemas, laxatives, or suppositories . Drink plenty of fluids (a minimum of eight glasses per day is recommended). . If you throw up or have loose bowel movements, you should drink more fluids so that you do not become dehydrated (lack water in the body from losing too much fluid). . To help with nausea and vomiting, eat small, frequent meals instead of three large meals a day. Choose foods and drinks that are at room temperature. Ask your nurse or doctor about other helpful tips and medicine that is available to  help or stop lessen these symptoms. . To help with decreased appetite, eat small, frequent meals . Eat high caloric food such as pudding, ice cream, yogurt and milkshakes. . Infusion reactions may happen for 24 hours after your infusion. If this happens, call 911 for emergency care.  Food and Drug Interactions . There are no known interactions of durvalumab with food. This drug may interact with other medicines. Tell your doctor  and pharmacist about all the medicines and dietary supplements (vitamins, minerals, herbs and others) that you are taking at this time. The safety and use of dietary supplements and alternative diets are often not known. Using these might affect your cancer or interfere with your treatment. Until more is known, you should not use dietary supplements or alternative diets without your cancer doctor's help.  When to Call the Doctor Call your doctor or nurse if you have any of these symptoms and/or any new or unusual symptoms: . Fever of 100.5 F (38 C) or higher . Chills, flushing . Wheezing, chest pain or trouble breathing . Facial swelling . Rash, itching or skin blistering . Rash that is not relieved by prescribed medicines . Pain when passing urine . Feeling dizzy or lightheaded . Confusion . Nausea that stops you from eating or drinking or is not relieved by prescribed medicine . No bowel movement for 3 days or you feel uncomfortable . Severe pain in the stomach area . Pain or burning when you pass urine. . Difficulty urinating . Feeling like you have to pass urine often, but not much comes out when you do. . Tender or heavy feeling in your lower abdomen. . Cloudy urine and/or urine that smells bad. . Pain on one side of your back under your ribs. This is where your kidneys are. . Decreased urine . Unusual thirst or passing urine often . Fatigue that interferes with your daily activities . Signs of liver problems: dark urine, pale bowel movements, bad stomach pain, feeling very tired and weak, unusual itching, or yellowing of the eyes or skin . Signs of infusion reaction: fever or shaking chills, flushing, facial swelling, feeling dizzy, headache, trouble breathing, rash, itching, chest tightness, or chest pain. . If you think you are pregnant  Reproduction Warnings . Pregnancy warning: This drug can have harmful effects on the unborn baby. It is recommended that effective methods of  birth control should be used by women of child-bearing age during cancer treatment and for at least 3 months after treatment. . Breast feeding warning: Women should not breast feed during treatment and for 3 month after treatment because this drug could enter the breast milk and cause harm to a breast feeding baby.    SELF CARE ACTIVITIES WHILE ON CHEMOTHERAPY: Hydration Increase your fluid intake 48 hours prior to treatment and drink at least 8 to 12 cups (64 ounces) of water/decaff beverages per day after treatment. You can still have your cup of coffee or soda but these beverages do not count as part of your 8 to 12 cups that you need to drink daily. No alcohol intake.  Medications Continue taking your normal prescription medication as prescribed.  If you start any new herbal or new supplements please let us know first to make sure it is safe.  Mouth Care Have teeth cleaned professionally before starting treatment. Keep dentures and partial plates clean. Use soft toothbrush and do not use mouthwashes that contain alcohol. Biotene is a good mouthwash that is available at most pharmacies or  may be ordered by calling [phone]. Use warm salt water gargles (1 teaspoon salt per 1 quart warm water) before and after meals and at bedtime. Or you may rinse with 2 tablespoons of three-percent hydrogen peroxide mixed in eight ounces of water. If you are still having problems with your mouth or sores in your mouth please call the clinic. If you need dental work, please let the doctor know before you go for your appointment so that we can coordinate the best possible time for you in regards to your chemo regimen. You need to also let your dentist know that you are actively taking chemo. We may need to do labs prior to your dental appointment.   Skin Care Always use sunscreen that has not expired and with SPF (Sun Protection Factor) of 50 or higher. Wear hats to protect your head from the sun. Remember  to use sunscreen on your hands, ears, face, & feet.  Use good moisturizing lotions such as udder cream, eucerin, or even Vaseline. Some chemotherapies can cause dry skin, color changes in your skin and nails.    . Avoid long, hot showers or baths. . Use gentle, fragrance-free soaps and laundry detergent. . Use moisturizers, preferably creams or ointments rather than lotions because the thicker consistency is better at preventing skin dehydration. Apply the cream or ointment within 15 minutes of showering. Reapply moisturizer at night, and moisturize your hands every time after you wash them.  Hair Loss (if your doctor says your hair will fall out)  . If your doctor says that your hair is likely to fall out, decide before you begin chemo whether you want to wear a wig. You may want to shop before treatment to match your hair color. . Hats, turbans, and scarves can also camouflage hair loss, although some people prefer to leave their heads uncovered. If you go bare-headed outdoors, be sure to use sunscreen on your scalp. . Cut your hair short. It eases the inconvenience of shedding lots of hair, but it also can reduce the emotional impact of watching your hair fall out. . Don't perm or color your hair during chemotherapy. Those chemical treatments are already damaging to hair and can enhance hair loss. Once your chemo treatments are done and your hair has grown back, it's OK to resume dyeing or perming hair. With chemotherapy, hair loss is almost always temporary. But when it grows back, it may be a different color or texture. In older adults who still had hair color before chemotherapy, the new growth may be completely gray.  Often, new hair is very fine and soft.  Infection Prevention Please wash your hands for at least 30 seconds using warm soapy water. Handwashing is the #1 way to prevent the spread of germs. Stay away from sick people or people who are getting over a cold. If you develop  respiratory systems such as green/yellow mucus production or productive cough or persistent cough let us know and we will see if you need an antibiotic. It is a good idea to keep a pair of gloves on when going into grocery stores/[employer] to decrease your risk of coming into contact with germs on the carts, etc. Carry alcohol hand gel with you at all times and use it frequently if out in public. If your temperature reaches 100.5 or higher please call the clinic and let us know.  If it is after hours or on the weekend please go to the ER if your temperature is  over 100.5.  Please have your own personal thermometer at home to use.    Sex and bodily fluids If you are going to have sex, a condom must be used to protect the person that isn't taking chemotherapy. Chemo can decrease your libido (sex drive). For a few days after chemotherapy, chemotherapy can be excreted through your bodily fluids.  When using the toilet please close the lid and flush the toilet twice.  Do this for a few day after you have had chemotherapy.     Effects of chemotherapy on your sex life Some changes are simple and won't last long. They won't affect your sex life permanently. Sometimes you may feel: . too tired . not strong enough to be very active . sick or sore  . not in the mood . anxious or low Your anxiety might not seem related to sex. For example, you may be worried about the cancer and how your treatment is going. Or you may be worried about money, or about how you family are coping with your illness. These things can cause stress, which can affect your interest in sex. It's important to talk to your partner about how you feel. Remember - the changes to your sex life don't usually last long. There's usually no medical reason to stop having sex during chemo. The drugs won't have any long term physical effects on your performance or enjoyment of sex. Cancer can't be passed on to your partner during  sex  Contraception It's important to use reliable contraception during treatment. Avoid getting pregnant while you or your partner are having chemotherapy. This is because the drugs may harm the baby. Sometimes chemotherapy drugs can leave a man or woman infertile.  This means you would not be able to have children in the future. You might want to talk to someone about permanent infertility. It can be very difficult to learn that you may no longer be able to have children. Some people find counselling helpful. There might be ways to preserve your fertility, although this is easier for men than for women. You may want to speak to a fertility expert. You can talk about sperm banking or harvesting your eggs. You can also ask about other fertility options, such as donor eggs. If you have or have had breast cancer, your doctor might advise you not to take the contraceptive pill. This is because the hormones in it might affect the cancer.  It is not known for sure whether or not chemotherapy drugs can be passed on through semen or secretions from the vagina. Because of this some doctors advise people to use a barrier method if you have sex during treatment. This applies to vaginal, anal or oral sex. Generally, doctors advise a barrier method only for the time you are actually having the treatment and for about a week after your treatment. Advice like this can be worrying, but this does not mean that you have to avoid being intimate with your partner. You can still have close contact with your partner and continue to enjoy sex.  Animals If you have cats or birds we just ask that you not change the litter or change the cage.  Please have someone else do this for you while you are on chemotherapy.   Food Safety During and After Cancer Treatment Food safety is important for people both during and after cancer treatment. Cancer and cancer treatments, such as chemotherapy, radiation therapy, and stem cell/bone  marrow transplantation, often weaken  the immune system. This makes it harder for your body to protect itself from foodborne illness, also called food poisoning. Foodborne illness is caused by eating food that contains harmful bacteria, parasites, or viruses.  Foods to avoid Some foods have a higher risk of becoming tainted with bacteria. These include: Marland Kitchen Unwashed fresh fruit and vegetables, especially leafy vegetables that can hide dirt and other contaminants . Raw sprouts, such as alfalfa sprouts . Raw or undercooked beef, especially ground beef, or other raw or undercooked meat and poultry . Fatty, fried, or spicy foods immediately before or after treatment.  These can sit heavy on your stomach and make you feel nauseous. . Raw or undercooked shellfish, such as oysters. . Sushi and sashimi, which often contain raw fish.  . Unpasteurized beverages, such as unpasteurized fruit juices, raw milk, raw yogurt, or cider . Undercooked eggs, such as soft boiled, over easy, and poached; raw, unpasteurized eggs; or foods made with raw egg, such as homemade raw cookie dough and homemade mayonnaise Simple steps for food safety Shop smart. . Do not buy food stored or displayed in an unclean area. . Do not buy bruised or damaged fruits or vegetables. . Do not buy cans that have cracks, dents, or bulges. . Pick up foods that can spoil at the end of your shopping trip and store them in a cooler on the way home. Prepare and clean up foods carefully. . Rinse all fresh fruits and vegetables under running water, and dry them with a clean towel or paper towel. . Clean the top of cans before opening them. . After preparing food, wash your hands for 20 seconds with hot water and soap. Pay special attention to areas between fingers and under nails. . Clean your utensils and dishes with hot water and soap. Marland Kitchen Disinfect your kitchen and cutting boards using 1 teaspoon of liquid, unscented bleach mixed into 1 quart of  water.   Dispose of old food. . Eat canned and packaged food before its expiration date (the "use by" or "best before" date). . Consume refrigerated leftovers within 3 to 4 days. After that time, throw out the food. Even if the food does not smell or look spoiled, it still may be unsafe. Some bacteria, such as Listeria, can grow even on foods stored in the refrigerator if they are kept for too long. Take precautions when eating out. . At restaurants, avoid buffets and salad bars where food sits out for a long time and comes in contact with many people. Food can become contaminated when someone with a virus, often a norovirus, or another "bug" handles it. . Put any leftover food in a "to-go" container yourself, rather than having the server do it. And, refrigerate leftovers as soon as you get home. . Choose restaurants that are clean and that are willing to prepare your food as you order it cooked.    MEDICATIONS:  Zofran/Ondansetron 8mg  tablet. Take 1 tablet every 8 hours as needed for nausea/vomiting. (#1 nausea med to take, this can constipate)  Compazine/Prochlorperazine 10mg  tablet. Take 1 tablet every 6 hours as needed for nausea/vomiting. (#2 nausea med to take, this can make you sleepy)   EMLA cream. Apply a quarter size amount to port site 1 hour prior to chemo. Do not rub in. Cover with plastic wrap.   Over-the-Counter Meds:  Miralax 1 capful in 8 oz of fluid daily. May increase to two times a day if needed. This is a stool softener. If  this doesn't work proceed you can add:  Senokot S-start with 1 tablet two times a day and increase to 4 tablets two times a day if needed. (total of 8 tablets in a 24 hour period). This is a stimulant laxative.   Call us if this does not help your bowels move.   Imodium 2mg  capsule. Take 2 capsules after the 1st loose stool and then 1 capsule every 2 hours until you go a total of 12 hours without having a loose stool. Call the [facility] if  loose stools continue. If diarrhea occurs @ bedtime, take 2 capsules @ bedtime. Then take 2 capsules every 4 hours until morning. Call [facility].     Diarrhea Sheet  If you are having loose stools/diarrhea, please purchase Imodium and begin taking as outlined:  At the first sign of poorly formed or loose stools you should begin taking Imodium(loperamide) 2 mg capsules.  Take two caplets (4mg ) followed by one caplet (2mg ) every 2 hours until you have had no diarrhea for 12 hours.  During the night take two caplets (4mg ) at bedtime and continue every 4 hours during the night until the morning.  Stop taking Imodium only after there is no sign of diarrhea for 12 hours.    Always call the [facility] if you are having loose stools/diarrhea that you can't get under control.  Loose stools/disrrhea leads to dehydration (loss of water) in your body.  We have other options of trying to get the loose stools/diarrhea to stopped but you must let us know!     Constipation Sheet *Miralax in 8 oz of fluid daily.  May increase to two times a day if needed.  This is a stool softener.  If this not enough to keep your bowel regular:  You can add:  *Senokot S, start with one tablet twice a day and can increase to 4 tablets twice a day if needed.  This is a stimulant laxative.   Sometimes when you take pain medication you need BOTH a medicine to keep your stool soft and a medicine to help your bowel push it out!  Please call if the above does not work for you.   Do not go more than 2 days without a bowel movement.  It is very important that you do not become constipated.  It will make you feel sick to your stomach (nausea) and can cause abdominal pain and vomiting.     Nausea Sheet  Zofran/Ondansetron 8mg  tablet. Take 1 tablet every 8 hours as needed for nausea/vomiting. (#1 nausea med to take, this can constipate)  Compazine/Prochlorperazine 10mg  tablet. Take 1 tablet every 6 hours as needed for  nausea/vomiting. (#2 nausea med to take, this can make you sleepy)  You can take these medications together or separately.  We would first like for you to try the Ondansetron by itself and then take the Prochloperizine if needed. But you are allowed to take both medications at the same time if your nausea is that severe.  If you are having persistent nausea (nausea that does not stop) please take these medications on a staggered schedule so that the nausea medication stays in your body.  Please call the [facility] and let us know the amount of nausea that you are experiencing.  If you begin to vomit, you need to call the [facility] and if it is the weekend and you have vomited more than one time and cant get it to stop-go to  the Emergency Room.  Persistent nausea/vomiting can lead to dehydration (loss of fluid in your body) and will make you feel terrible.   Ice chips, sips of clear liquids, foods that are @ room temperature, crackers, and toast tend to be better tolerated.    SYMPTOMS TO REPORT AS SOON AS POSSIBLE AFTER TREATMENT:  FEVER GREATER THAN 100.5 F  CHILLS WITH OR WITHOUT FEVER  NAUSEA AND VOMITING THAT IS NOT CONTROLLED WITH YOUR NAUSEA MEDICATION  UNUSUAL SHORTNESS OF BREATH  UNUSUAL BRUISING OR BLEEDING  TENDERNESS IN MOUTH AND THROAT WITH OR WITHOUT PRESENCE OF ULCERS  URINARY PROBLEMS  BOWEL PROBLEMS  UNUSUAL RASH    Wear comfortable clothing and clothing appropriate for easy access to any Portacath or PICC line. Let us know if there is anything that we can do to make your therapy better!    What to do if you need assistance after hours or on the weekends: CALL [phone].  HOLD on the line, do not hang up.  You will hear multiple messages but at the end you will be connected with a nurse triage line.  They will contact the doctor if necessary.  Most of the time they will be able to assist you.  Do not call the hospital operator.     I have been informed  and understand all of the instructions given to me and have received a copy. I have been instructed to call the clinic [phone] or my family physician as soon as possible for continued medical care, if indicated. I do not have any more questions at this time but understand that I may call the [facility] or the Patient Navigator at [phone] during office hours should I have questions or need assistance in obtaining fol

## 2016-08-21 NOTE — Progress Notes (Signed)
Chemotherapy teaching pulled together. 

## 2016-08-28 ENCOUNTER — Ambulatory Visit (HOSPITAL_COMMUNITY)
Admission: RE | Admit: 2016-08-28 | Discharge: 2016-08-28 | Disposition: A | Discharge status: Home / Self Care | Payer: Medicare Other | Source: Ambulatory Visit | Attending: Oncology | Admitting: Oncology

## 2016-08-28 DIAGNOSIS — I700 Atherosclerosis of aorta: Secondary | ICD-10-CM | POA: Insufficient documentation

## 2016-08-28 DIAGNOSIS — K5730 Diverticulosis of large intestine without perforation or abscess without bleeding: Secondary | ICD-10-CM | POA: Insufficient documentation

## 2016-08-28 DIAGNOSIS — D3501 Benign neoplasm of right adrenal gland: Secondary | ICD-10-CM | POA: Insufficient documentation

## 2016-08-28 DIAGNOSIS — Z9049 Acquired absence of other specified parts of digestive tract: Secondary | ICD-10-CM | POA: Insufficient documentation

## 2016-08-28 DIAGNOSIS — Z9889 Other specified postprocedural states: Secondary | ICD-10-CM | POA: Insufficient documentation

## 2016-08-28 DIAGNOSIS — C3491 Malignant neoplasm of unspecified part of right bronchus or lung: Secondary | ICD-10-CM | POA: Insufficient documentation

## 2016-08-28 DIAGNOSIS — J439 Emphysema, unspecified: Secondary | ICD-10-CM | POA: Insufficient documentation

## 2016-08-28 DIAGNOSIS — I2510 Atherosclerotic heart disease of native coronary artery without angina pectoris: Secondary | ICD-10-CM | POA: Insufficient documentation

## 2016-08-28 LAB — POCT I-STAT CREATININE: Creatinine, Ser: 1 mg/dL (ref 0.61–1.24)

## 2016-08-28 MED ORDER — IOPAMIDOL (ISOVUE-300) INJECTION 61%
100.0000 mL | Freq: Once | INTRAVENOUS | Status: AC | PRN
  Administered 2016-08-28: 100 mL via INTRAVENOUS

## 2016-08-30 ENCOUNTER — Ambulatory Visit (INDEPENDENT_AMBULATORY_CARE_PROVIDER_SITE_OTHER): Payer: Medicare Other | Admitting: Otolaryngology

## 2016-09-04 ENCOUNTER — Encounter (HOSPITAL_COMMUNITY): Payer: Medicare Other

## 2016-09-04 ENCOUNTER — Encounter (HOSPITAL_COMMUNITY): Payer: Self-pay

## 2016-09-04 ENCOUNTER — Encounter (HOSPITAL_BASED_OUTPATIENT_CLINIC_OR_DEPARTMENT_OTHER): Payer: Medicare Other

## 2016-09-04 VITALS — BP 133/56 | HR 86 | Temp 97.5°F | Resp 16 | Wt 205.4 lb

## 2016-09-04 DIAGNOSIS — C3491 Malignant neoplasm of unspecified part of right bronchus or lung: Secondary | ICD-10-CM

## 2016-09-04 DIAGNOSIS — D649 Anemia, unspecified: Secondary | ICD-10-CM | POA: Diagnosis present

## 2016-09-04 DIAGNOSIS — R918 Other nonspecific abnormal finding of lung field: Secondary | ICD-10-CM | POA: Diagnosis not present

## 2016-09-04 DIAGNOSIS — C342 Malignant neoplasm of middle lobe, bronchus or lung: Secondary | ICD-10-CM

## 2016-09-04 DIAGNOSIS — Z5112 Encounter for antineoplastic immunotherapy: Secondary | ICD-10-CM

## 2016-09-04 DIAGNOSIS — E538 Deficiency of other specified B group vitamins: Secondary | ICD-10-CM | POA: Diagnosis present

## 2016-09-04 LAB — COMPREHENSIVE METABOLIC PANEL
ALT: 17 U/L (ref 17–63)
AST: 25 U/L (ref 15–41)
Albumin: 3.2 g/dL — ABNORMAL LOW (ref 3.5–5.0)
Alkaline Phosphatase: 62 U/L (ref 38–126)
Anion gap: 6 (ref 5–15)
BUN: 9 mg/dL (ref 6–20)
CO2: 28 mmol/L (ref 22–32)
Calcium: 8.9 mg/dL (ref 8.9–10.3)
Chloride: 103 mmol/L (ref 101–111)
Creatinine, Ser: 1 mg/dL (ref 0.61–1.24)
GFR calc Af Amer: >60 mL/min (ref >60)
GFR calc non Af Amer: >60 mL/min (ref >60)
Glucose, Bld: 144 mg/dL — ABNORMAL HIGH (ref 65–99)
Potassium: 3.5 mmol/L (ref 3.5–5.1)
Sodium: 137 mmol/L (ref 135–145)
Total Bilirubin: 0.6 mg/dL (ref 0.3–1.2)
Total Protein: 6.1 g/dL — ABNORMAL LOW (ref 6.5–8.1)

## 2016-09-04 LAB — CBC WITH DIFFERENTIAL/PLATELET
Basophils Absolute: 0 10*3/uL (ref 0.0–0.1)
Basophils Relative: 0 %
Eosinophils Absolute: 1 10*3/uL — ABNORMAL HIGH (ref 0.0–0.7)
Eosinophils Relative: 13 %
HCT: 32.4 % — ABNORMAL LOW (ref 39.0–52.0)
Hemoglobin: 11.2 g/dL — ABNORMAL LOW (ref 13.0–17.0)
Lymphocytes Relative: 17 %
Lymphs Abs: 1.3 10*3/uL (ref 0.7–4.0)
MCH: 30.7 pg (ref 26.0–34.0)
MCHC: 34.6 g/dL (ref 30.0–36.0)
MCV: 88.8 fL (ref 78.0–100.0)
Monocytes Absolute: 0.9 10*3/uL (ref 0.1–1.0)
Monocytes Relative: 12 %
Neutro Abs: 4.4 10*3/uL (ref 1.7–7.7)
Neutrophils Relative %: 58 %
Platelets: 185 10*3/uL (ref 150–400)
RBC: 3.65 MIL/uL — ABNORMAL LOW (ref 4.22–5.81)
RDW: 12.8 % (ref 11.5–15.5)
WBC: 7.6 10*3/uL (ref 4.0–10.5)

## 2016-09-04 LAB — TSH: TSH: 2.693 u[IU]/mL (ref 0.350–4.500)

## 2016-09-04 MED ORDER — HEPARIN SOD (PORK) LOCK FLUSH 100 UNIT/ML IV SOLN
INTRAVENOUS | Status: AC
  Filled 2016-09-04: qty 5

## 2016-09-04 MED ORDER — DURVALUMAB 500 MG/10ML IV SOLN
10.0000 mg/kg | Freq: Once | INTRAVENOUS | Status: AC
  Administered 2016-09-04: 920 mg via INTRAVENOUS
  Filled 2016-09-04: qty 18.4

## 2016-09-04 MED ORDER — SODIUM CHLORIDE 0.9% FLUSH: 10.0000 mL | INTRAVENOUS | Status: DC | PRN

## 2016-09-04 MED ORDER — SODIUM CHLORIDE 0.9 % IV SOLN
Freq: Once | INTRAVENOUS | Status: AC
  Administered 2016-09-04: 12:00:00 via INTRAVENOUS

## 2016-09-04 MED ORDER — HEPARIN SOD (PORK) LOCK FLUSH 100 UNIT/ML IV SOLN
500.0000 [IU] | Freq: Once | INTRAVENOUS | Status: AC | PRN
  Administered 2016-09-04: 500 [IU]

## 2016-09-04 NOTE — Patient Instructions (Signed)
[facility] Discharge Instructions for Patients Receiving Chemotherapy   Beginning January 23rd 2017 lab work for the [facility] will be done in the  Main lab at [facility] on 1st floor. If you have a lab appointment with the [facility] please come in thru the  Main Entrance and check in at the main information desk   Today you received the following chemotherapy agent: Imfinzi.     If you develop nausea and vomiting, or diarrhea that is not controlled by your medication, call the clinic.  The clinic phone number is [phone]. Office hours are Monday-Friday 8:30am-5:00pm.  BELOW ARE SYMPTOMS THAT SHOULD BE REPORTED IMMEDIATELY:  *FEVER GREATER THAN 101.0 F  *CHILLS WITH OR WITHOUT FEVER  NAUSEA AND VOMITING THAT IS NOT CONTROLLED WITH YOUR NAUSEA MEDICATION  *UNUSUAL SHORTNESS OF BREATH  *UNUSUAL BRUISING OR BLEEDING  TENDERNESS IN MOUTH AND THROAT WITH OR WITHOUT PRESENCE OF ULCERS  *URINARY PROBLEMS  *BOWEL PROBLEMS  UNUSUAL RASH Items with * indicate a potential emergency and should be followed up as soon as possible. If you have an emergency after office hours please contact your primary care physician or go to the nearest emergency department.  Please call the clinic during office hours if you have any questions or concerns.   You may also contact the Patient Navigator at [phone] should you have any questions or need assistance in obtaining follow up care.      Resources For Cancer Patients and their Caregivers ? American Cancer Society: Can assist with transportation, wigs, general needs, runs Look Good Feel Better.        918-204-1865 ? Cancer Care: Provides financial assistance, online support groups, medication/co-pay assistance.  1-800-813-HOPE 364-581-5596) ? [place] Assists [place] cancer patients and their families through emotional , educational and financial support.   337-235-3662 ? [place] DSS Where to apply for food stamps, Medicaid and utility assistance. 574-672-9615 ? [employer]: Transportation to medical appointments. (979)758-1781 ? Social Security Administration: May apply for disability if have a Stage IV cancer. (534) 492-4658 (415)567-7316 ? [employer], Disability and Transit Services: Assists with nutrition, care and transit needs. [phone]

## 2016-09-04 NOTE — Progress Notes (Signed)
Patient and wife educated on [place] and consent signed.  Labs reviewed with wife and all questions answered as well as a copy was given to her.  Patient tolerated infusion well.  VSS.  Patient ambulatory and stable upon discharge from clinic.

## 2016-09-05 ENCOUNTER — Telehealth (HOSPITAL_COMMUNITY): Payer: Self-pay

## 2016-09-05 NOTE — Telephone Encounter (Signed)
See telephone encounter notes

## 2016-09-10 ENCOUNTER — Encounter (HOSPITAL_COMMUNITY): Payer: Medicare Other

## 2016-09-11 ENCOUNTER — Encounter (HOSPITAL_COMMUNITY): Payer: Self-pay | Admitting: Oncology

## 2016-09-11 ENCOUNTER — Encounter (HOSPITAL_BASED_OUTPATIENT_CLINIC_OR_DEPARTMENT_OTHER): Payer: Medicare Other

## 2016-09-11 ENCOUNTER — Encounter (HOSPITAL_COMMUNITY): Payer: Medicare Other

## 2016-09-11 ENCOUNTER — Encounter (HOSPITAL_COMMUNITY): Payer: Medicare Other | Attending: Hematology & Oncology | Admitting: Oncology

## 2016-09-11 DIAGNOSIS — C342 Malignant neoplasm of middle lobe, bronchus or lung: Secondary | ICD-10-CM | POA: Diagnosis present

## 2016-09-11 DIAGNOSIS — E876 Hypokalemia: Secondary | ICD-10-CM | POA: Diagnosis not present

## 2016-09-11 DIAGNOSIS — E538 Deficiency of other specified B group vitamins: Secondary | ICD-10-CM | POA: Diagnosis present

## 2016-09-11 DIAGNOSIS — D649 Anemia, unspecified: Secondary | ICD-10-CM | POA: Insufficient documentation

## 2016-09-11 DIAGNOSIS — C3491 Malignant neoplasm of unspecified part of right bronchus or lung: Secondary | ICD-10-CM

## 2016-09-11 DIAGNOSIS — Z95828 Presence of other vascular implants and grafts: Secondary | ICD-10-CM

## 2016-09-11 DIAGNOSIS — R918 Other nonspecific abnormal finding of lung field: Secondary | ICD-10-CM | POA: Insufficient documentation

## 2016-09-11 LAB — COMPREHENSIVE METABOLIC PANEL
ALT: 17 U/L (ref 17–63)
AST: 28 U/L (ref 15–41)
Albumin: 3.2 g/dL — ABNORMAL LOW (ref 3.5–5.0)
Alkaline Phosphatase: 59 U/L (ref 38–126)
Anion gap: 8 (ref 5–15)
BUN: 16 mg/dL (ref 6–20)
CO2: 26 mmol/L (ref 22–32)
Calcium: 8.8 mg/dL — ABNORMAL LOW (ref 8.9–10.3)
Chloride: 103 mmol/L (ref 101–111)
Creatinine, Ser: 1.13 mg/dL (ref 0.61–1.24)
GFR calc Af Amer: >60 mL/min (ref >60)
GFR calc non Af Amer: 58 mL/min — ABNORMAL LOW (ref >60)
Glucose, Bld: 184 mg/dL — ABNORMAL HIGH (ref 65–99)
Potassium: 3.4 mmol/L — ABNORMAL LOW (ref 3.5–5.1)
Sodium: 137 mmol/L (ref 135–145)
Total Bilirubin: 0.7 mg/dL (ref 0.3–1.2)
Total Protein: 6 g/dL — ABNORMAL LOW (ref 6.5–8.1)

## 2016-09-11 LAB — CBC WITH DIFFERENTIAL/PLATELET
Basophils Absolute: 0 10*3/uL (ref 0.0–0.1)
Basophils Relative: 0 %
Eosinophils Absolute: 0.9 10*3/uL — ABNORMAL HIGH (ref 0.0–0.7)
Eosinophils Relative: 14 %
HCT: 31.4 % — ABNORMAL LOW (ref 39.0–52.0)
Hemoglobin: 10.9 g/dL — ABNORMAL LOW (ref 13.0–17.0)
Lymphocytes Relative: 23 %
Lymphs Abs: 1.4 10*3/uL (ref 0.7–4.0)
MCH: 31 pg (ref 26.0–34.0)
MCHC: 34.7 g/dL (ref 30.0–36.0)
MCV: 89.2 fL (ref 78.0–100.0)
Monocytes Absolute: 0.7 10*3/uL (ref 0.1–1.0)
Monocytes Relative: 12 %
Neutro Abs: 3.1 10*3/uL (ref 1.7–7.7)
Neutrophils Relative %: 51 %
Platelets: 192 10*3/uL (ref 150–400)
RBC: 3.52 MIL/uL — ABNORMAL LOW (ref 4.22–5.81)
RDW: 13.1 % (ref 11.5–15.5)
WBC: 6 10*3/uL (ref 4.0–10.5)

## 2016-09-11 MED ORDER — CYANOCOBALAMIN 1000 MCG/ML IJ SOLN
1000.0000 ug | Freq: Once | INTRAMUSCULAR | Status: AC
  Administered 2016-09-11: 1000 ug via INTRAMUSCULAR

## 2016-09-11 MED ORDER — HEPARIN SOD (PORK) LOCK FLUSH 100 UNIT/ML IV SOLN
500.0000 [IU] | Freq: Once | INTRAVENOUS | Status: AC
  Administered 2016-09-11: 500 [IU] via INTRAVENOUS

## 2016-09-11 MED ORDER — CYANOCOBALAMIN 1000 MCG/ML IJ SOLN
INTRAMUSCULAR | Status: AC
  Filled 2016-09-11: qty 1

## 2016-09-11 MED ORDER — HEPARIN SOD (PORK) LOCK FLUSH 100 UNIT/ML IV SOLN
INTRAVENOUS | Status: AC
  Filled 2016-09-11: qty 5

## 2016-09-11 MED ORDER — SODIUM CHLORIDE 0.9% FLUSH
10.0000 mL | INTRAVENOUS | Status: DC | PRN
  Administered 2016-09-11: 10 mL via INTRAVENOUS
  Filled 2016-09-11: qty 10

## 2016-09-11 NOTE — Patient Instructions (Signed)
[name] at [facility] Discharge Instructions  RECOMMENDATIONS MADE BY THE CONSULTANT AND ANY TEST RESULTS WILL BE SENT TO YOUR REFERRING PHYSICIAN.  You were seen today by [name] PA-C. Continue monthly B12 injections. Return in 4/10 for next treatment. Return in 3 weeks for follow up and treatment.   Thank you for choosing [place] at [facility] to provide your oncology and hematology care.  To afford each patient quality time with our provider, please arrive at least 15 minutes before your scheduled appointment time.    If you have a lab appointment with the [facility] please come in thru the  Main Entrance and check in at the main information desk  You need to re-schedule your appointment should you arrive 10 or more minutes late.  We strive to give you quality time with our providers, and arriving late affects you and other patients whose appointments are after yours.  Also, if you no show three or more times for appointments you may be dismissed from the clinic at the providers discretion.     Again, thank you for choosing [facility].  Our hope is that these requests will decrease the amount of time that you wait before being seen by our physicians.       _____________________________________________________________  Should you have questions after your visit to [facility], please contact our office at [phone] between the hours of 8:30 a.m. and 4:30 p.m.  Voicemails left after 4:30 p.m. will not be returned until the following business day.  For prescription refill requests, have your pharmacy contact our office.       Resources For Cancer Patients and their Caregivers ? American Cancer Society: Can assist with transportation, wigs, general needs, runs Look Good Feel Better.        (608)011-4296 ? Cancer Care: Provides financial assistance, online support groups, medication/co-pay  assistance.  1-800-813-HOPE (385) 340-0514) ? [place] Assists [place] cancer patients and their families through emotional , educational and financial support.  667-548-2606 ? [place] DSS Where to apply for food stamps, Medicaid and utility assistance. 559-280-1475 ? [employer]: Transportation to medical appointments. 513-136-0713 ? Social Security Administration: May apply for disability if have a Stage IV cancer. 438-019-8257 631-695-7490 ? [employer], Disability and Transit Services: Assists with nutrition, care and transit needs. [place] Support Programs: '@10RELATIVEDAYS'$ @ > Cancer Support Group  2nd Tuesday of the month 1pm-2pm, Journey Room  > Creative Journey  3rd Tuesday of the month 1130am-1pm, Journey Room  > Look Good Feel Better  1st Wednesday of the month 10am-12 noon, Journey Room (Call [place] to register [phone])

## 2016-09-11 NOTE — Assessment & Plan Note (Addendum)
Stage IIIA ([id number]) adenocarcinoma of right lung, S/P R VATS, RML lobectomy, and mediastinal lymph node dissection by Dr. Roxan Hockey on 12/30/2015.  On adjuvant systemic chemotherapy consisting of Carboplatin/Alimta beginning on 02/16/2016- 05/31/2016.  S/P XRT finishing on 08/01/2016.  Now on consolidative therapy with Imfinzi beginning on 09/04/2016.  Oncology history is updated.  Labs today: CBC diff, CMET.  I personally reviewed and went over laboratory results with the patient.  The results are noted within this dictation.    Mild anemia is stable. Minimal hypokalemia is noted.  No intervention recommended at this time.  Problem list reviewed with patient and edited accordingly.  Medications are reviewed with the patient and edited accordingly.  B12 injection today as scheduled. B12 injection monthly.  Supportive therapy plan is reviewed.  Pre-treatment labs are ordered and on stand-by for treatment approval moving forward.  I have encouraged light exercise.  Return on 09/18/2016 for next cycle of Imfinzi.  Return in 3 weeks for follow-up and Imfinzi treatment.  More than 50% of the time spent with the patient was utilized for counseling and coordination of care.

## 2016-09-11 NOTE — Progress Notes (Signed)
[facility], [place] [id number]  Adenocarcinoma of right lung, stage 3 ([place]) - Plan: Comprehensive metabolic panel, CBC with Differential  CURRENT THERAPY:  Consolidative therapy with Imfinzi beginning on 09/04/2016.  B12 monthly.  INTERVAL HISTORY: [name] 83 y.o. male returns for followup of Stage IIIA ([id number]) adenocarcinoma of right lung, S/P R VATS, RML lobectomy, and mediastinal lymph node dissection by Dr. Roxan Hockey on 12/30/2015.  He is S/P 6 cycles of Carboplatin/Alimta 02/16/2016- 05/31/2016).  Now on consolidative therapy with Imfinzi beginning on 09/04/2016.    Adenocarcinoma of right lung, stage 3 (HCC)   11/10/2015 Imaging    13 mm spiculated lesion seen in right middle lobe.      11/30/2015 PET scan    Spiculated right middle lobe nodule is mildly hypermetabolic and most consistent with a stage IA adenocarcinoma.      12/30/2015 Surgery    Right middle lobectomy and node dissection      12/30/2015 Procedure    Right video-assisted thoracoscopy, Thoracoscopic right middle lobectomy, Mediastinal lymph node dissection, and On-Q local anesthetic catheter placement.      01/02/2016 Pathology Results    1. Lung, resection (segmental or lobe), Right Middle Lobe - INVASIVE ADENOCARCINOMA, WELL DIFFERENTIATED, SPANNING 1.2 CM. - THE SURGICAL RESECTION MARGINS ARE NEGATIVE FOR CARCINOMA. 2. Lymph node, biopsy, Level 12 - METASTATIC CARCINOMA IN 1 OF 1 LYMPH NODE (1/1). 3. Lymph node, biopsy, Level 12 #2 - METASTATIC CARCINOMA IN 1 OF 1 LYMPH NODE (1/1). 4. Lymph node, biopsy, Level 7 - METASTATIC CARCINOMA IN 1 OF 1 LYMPH NODE (1/1/). 5. Lymph node, biopsy, Level 7 #2 - METASTATIC CARCINOMA IN 1 OF 1 LYMPH NODE (1/1). 6. Lymph node, biopsy, Level 7 #3 - METASTATIC CARCINOMA IN 1 OF 1 LYMPH NODE (1/1). 7. Lymph node, biopsy, Level 7 #4 - METASTATIC CARCINOMA IN 1 OF 1 LYMPH NODE (1/1). 8. Lymph node, biopsy, 4R - THERE IS NO EVIDENCE OF  CARCINOMA IN 1 OF 1 LYMPH NODE (0/1). 9. Lymph node, biopsy, 4R #2 - THERE IS NO EVIDENCE OF CARCINOMA IN 1 OF 1 LYMPH NODE (0/1).      01/05/2016 Pathology Results    PDL1 NEGATIVE- tumor proportion score of 0%.       01/05/2016 Pathology Results    Genomic alterations identified: BRAF V600E, KIT amplification, PDGFRA amplification, CDKN2A/B loss, TP53 S71f*33.  Additional findings: MSI-STABLE.  No reportable alterations identified: EGFR, KRAS, ALK, MET, RET, ERBB2, ROS1      02/09/2016 Procedure    Port placed by IR.      02/16/2016 - 05/31/2016 Chemotherapy    Carboplatin/Pemetrexed x 6 cycles        - 08/01/2016 Radiation Therapy    [place], [place]      08/28/2016 Imaging    CT CAP- No evidence of recurrent or metastatic carcinoma within the chest, abdomen, or pelvis.  Stable incidental findings include a absent, small benign right adrenal adenoma, sigmoid diverticulosis, and aortic atherosclerosis.      09/04/2016 -  Chemotherapy    Imfinzi immunotherapy up to 52 weeks.        He is tolerating therapy well.  He denies any nausea or vomiting.  He does take naps during the day, but this is at baseline.  He reports not sleeping well at night due to nocturia.  He is concerned about taking sleeping aid due to fear of not waking to urinate and wetting himself.  He denies any side effects of therapy at this time: Side effects of Durvalumab: Peripheral edema, fatigue, skin rash, hyponatremia, constipation, decreased appetite, nausea, abdominal pain, colitis, diarrhea, tract infection, lymphocytopenia, infection, muscular skeletal pain, dyspnea, dyspnea on exertion, fever.  He is encouraged to get outside and perform some light exercising as the weather gets nicer.  I want him to remain active.  Review of Systems  Constitutional: Negative.  Negative for chills, fever and weight loss.  HENT: Negative.   Eyes: Negative.   Respiratory: Negative.  Negative for cough.     Cardiovascular: Negative.  Negative for chest pain.  Gastrointestinal: Negative.  Negative for blood in stool, constipation, diarrhea, melena, nausea and vomiting.  Genitourinary: Negative.   Musculoskeletal: Positive for back pain (chronic, at baseline).  Skin: Negative.   Neurological: Negative.  Negative for weakness.  Endo/Heme/Allergies: Negative.   Psychiatric/Behavioral: Negative.     Past Medical History:  Diagnosis Date  . Adenocarcinoma of lung, stage 3 (HCC)    Stage IIIA  . Adenocarcinoma of right lung, stage 3 ([place]) 12/02/2015  . Arthritis   . Atrial fibrillation, transient ([place])    transient postop, < 24 hours  . B12 deficiency   . Cyst of scrotum   . Diverticulitis   . GERD (gastroesophageal reflux disease)   . Hernia   . History of pneumonia   . Hyperlipidemia   . Lung nodule    Spiculated right middle lobe nodule, hypermetabolic  . Type 2 diabetes mellitus ([place])     Past Surgical History:  Procedure Laterality Date  . BACK SURGERY    . CHOLECYSTECTOMY    . COLONOSCOPY N/A 11/24/2015   Procedure: COLONOSCOPY;  Surgeon: Rogene Houston, MD;  Location: [facility];  Service: Endoscopy;  Laterality: N/A;  730  . COLONOSCOPY W/ POLYPECTOMY     x 5  . CYST EXCISION     Scrotum  . EYE SURGERY Bilateral    Cataract with Lens  . HERNIA REPAIR Right    Inguinal  . IR GENERIC HISTORICAL  02/09/2016   IR US GUIDE VASC ACCESS RIGHT 02/09/2016 Arne Cleveland, MD [facility]INTER[facility]  . IR GENERIC HISTORICAL  02/09/2016   IR FLUORO GUIDE CV LINE RIGHT 02/09/2016 Arne Cleveland, MD [facility]  . LUMBAR DISC SURGERY     per patient report  . VIDEO ASSISTED THORACOSCOPY (VATS)/ LOBECTOMY Right 12/30/2015   Procedure: VIDEO ASSISTED THORACOSCOPY (VATS)/RIGHT MIDDLE LOBECTOMY;  Surgeon: Melrose Nakayama, MD;  Location: [place];  Service: Thoracic;  Laterality: Right;    Family History  Problem Relation Age of Onset  . Colon cancer Brother     Social History    Social History  . Marital status: Married    Spouse name: N/A  . Number of children: N/A  . Years of education: N/A   Social History Main Topics  . Smoking status: Former Smoker    Types: Cigarettes    Quit date: 05/27/2004  . Smokeless tobacco: Never Used  . Alcohol use No  . Drug use: No  . Sexual activity: Not Asked     Comment: married   Other Topics Concern  . None   Social History Narrative  . None     PHYSICAL EXAMINATION  ECOG PERFORMANCE STATUS: 1 - Symptomatic but completely ambulatory  Vitals:   09/11/16 0849  BP: (!) 130/42  Pulse: 88  Resp: 16  Temp: 97.7 F (36.5 C)     GENERAL:alert, no distress, well nourished,  well developed, comfortable, cooperative, obese, smiling and accompanied by his wife. SKIN: skin color, texture, turgor are normal, no rashes or significant lesions HEAD: Normocephalic, No masses, lesions, tenderness or abnormalities EYES: normal, EOMI, Conjunctiva are pink and non-injected EARS: External ears normal OROPHARYNX:lips, buccal mucosa, and tongue normal  NECK: supple, trachea midline LYMPH:  not examined BREAST:not examined LUNGS: clear to auscultation without wheezes, rales, or rhonchi HEART: regular rate & rhythm, no murmur, rub, or gallop.  Normal S1 and S2 ABDOMEN:abdomen soft, non-tender, obese and normal bowel sounds BACK: Back symmetric, no curvature. EXTREMITIES:less then 2 second capillary refill, no joint deformities, effusion, or inflammation, no skin discoloration, no clubbing, no cyanosis.  B/L LE edema, 1+ pitting. NEURO: alert & oriented x 3 with fluent speech, no focal motor/sensory deficits, gait normal   LABORATORY DATA: CBC    Component Value Date/Time   WBC 6.0 09/11/2016 0849   RBC 3.52 (L) 09/11/2016 0849   HGB 10.9 (L) 09/11/2016 0849   HCT 31.4 (L) 09/11/2016 0849   PLT 192 09/11/2016 0849   MCV 89.2 09/11/2016 0849   MCH 31.0 09/11/2016 0849   MCHC 34.7 09/11/2016 0849   RDW 13.1  09/11/2016 0849   LYMPHSABS 1.4 09/11/2016 0849   MONOABS 0.7 09/11/2016 0849   EOSABS 0.9 (H) 09/11/2016 0849   BASOSABS 0.0 09/11/2016 0849      Chemistry      Component Value Date/Time   NA 137 09/11/2016 0849   K 3.4 (L) 09/11/2016 0849   CL 103 09/11/2016 0849   CO2 26 09/11/2016 0849   BUN 16 09/11/2016 0849   CREATININE 1.13 09/11/2016 0849   CREATININE 0.95 11/03/2015 1452      Component Value Date/Time   CALCIUM 8.8 (L) 09/11/2016 0849   ALKPHOS 59 09/11/2016 0849   AST 28 09/11/2016 0849   ALT 17 09/11/2016 0849   BILITOT 0.7 09/11/2016 0849     Lab Results  Component Value Date   TSH 2.693 09/04/2016     PENDING LABS:   RADIOGRAPHIC STUDIES:  Ct Chest W Contrast  Result Date: 08/28/2016 CLINICAL DATA:  Followup stage III right lung adenocarcinoma. EXAM: CT CHEST, ABDOMEN, AND PELVIS WITH CONTRAST TECHNIQUE: Multidetector CT imaging of the chest, abdomen and pelvis was performed following the standard protocol during bolus administration of intravenous contrast. CONTRAST:  161m ISOVUE-300 IOPAMIDOL (ISOVUE-300) INJECTION 61% COMPARISON:  PET-CT 11/30/2015 FINDINGS: CT CHEST FINDINGS Cardiovascular: No acute findings. Aortic and coronary artery atherosclerosis. Mediastinum/Lymph Nodes: No masses or pathologically enlarged lymph nodes identified. Lungs/Pleura: Postop changes from right middle lobectomy. No suspicious pulmonary nodules or masses identified. Mild to moderate emphysema again noted. Mild right pleural thickening. No evidence of pulmonary infiltrate. Musculoskeletal:  No suspicious bone lesions identified. CT ABDOMEN AND PELVIS FINDINGS Hepatobiliary: A few tiny sub-cm hepatic lesions too small to characterize, but remain stable compared to previous CT of 11/10/2015, consistent with benign etiology. No new or enlarging liver lesions identified. Prior cholecystectomy noted. No evidence of biliary dilatation. Pancreas:  No mass or inflammatory changes.  Spleen:  Within normal limits in size and appearance. Adrenals/Urinary tract: Stable 1.4 cm right adrenal mass, consistent with benign adenoma. Simple bilateral renal cysts. No evidence of renal masses or hydronephrosis. Unopacified urinary bladder is unremarkable in appearance. Stomach/Bowel: No evidence of obstruction, inflammatory process, or abnormal fluid collections. Sigmoid diverticulosis, without evidence diverticulitis. Vascular/Lymphatic: No pathologically enlarged lymph nodes identified. No abdominal aortic aneurysm. Aortic atherosclerosis. Reproductive:  No mass or other significant abnormality identified.  Other:  None. Musculoskeletal: No suspicious bone lesions identified. PLIF hardware at L4-5. IMPRESSION: Postop changes in right hemithorax. No evidence of recurrent or metastatic carcinoma within the chest, abdomen, or pelvis. Stable incidental findings include a absent, small benign right adrenal adenoma, sigmoid diverticulosis, and aortic atherosclerosis. Electronically Signed   By: Earle Gell M.D.   On: 08/28/2016 12:52   Ct Abdomen Pelvis W Contrast  Result Date: 08/28/2016 CLINICAL DATA:  Followup stage III right lung adenocarcinoma. EXAM: CT CHEST, ABDOMEN, AND PELVIS WITH CONTRAST TECHNIQUE: Multidetector CT imaging of the chest, abdomen and pelvis was performed following the standard protocol during bolus administration of intravenous contrast. CONTRAST:  175m ISOVUE-300 IOPAMIDOL (ISOVUE-300) INJECTION 61% COMPARISON:  PET-CT 11/30/2015 FINDINGS: CT CHEST FINDINGS Cardiovascular: No acute findings. Aortic and coronary artery atherosclerosis. Mediastinum/Lymph Nodes: No masses or pathologically enlarged lymph nodes identified. Lungs/Pleura: Postop changes from right middle lobectomy. No suspicious pulmonary nodules or masses identified. Mild to moderate emphysema again noted. Mild right pleural thickening. No evidence of pulmonary infiltrate. Musculoskeletal:  No suspicious bone lesions  identified. CT ABDOMEN AND PELVIS FINDINGS Hepatobiliary: A few tiny sub-cm hepatic lesions too small to characterize, but remain stable compared to previous CT of 11/10/2015, consistent with benign etiology. No new or enlarging liver lesions identified. Prior cholecystectomy noted. No evidence of biliary dilatation. Pancreas:  No mass or inflammatory changes. Spleen:  Within normal limits in size and appearance. Adrenals/Urinary tract: Stable 1.4 cm right adrenal mass, consistent with benign adenoma. Simple bilateral renal cysts. No evidence of renal masses or hydronephrosis. Unopacified urinary bladder is unremarkable in appearance. Stomach/Bowel: No evidence of obstruction, inflammatory process, or abnormal fluid collections. Sigmoid diverticulosis, without evidence diverticulitis. Vascular/Lymphatic: No pathologically enlarged lymph nodes identified. No abdominal aortic aneurysm. Aortic atherosclerosis. Reproductive:  No mass or other significant abnormality identified. Other:  None. Musculoskeletal: No suspicious bone lesions identified. PLIF hardware at L4-5. IMPRESSION: Postop changes in right hemithorax. No evidence of recurrent or metastatic carcinoma within the chest, abdomen, or pelvis. Stable incidental findings include a absent, small benign right adrenal adenoma, sigmoid diverticulosis, and aortic atherosclerosis. Electronically Signed   By: JEarle GellM.D.   On: 08/28/2016 12:52     PATHOLOGY:    ASSESSMENT AND PLAN:  Adenocarcinoma of right lung, stage 3 (HCC) Stage IIIA (([id number] adenocarcinoma of right lung, S/P R VATS, RML lobectomy, and mediastinal lymph node dissection by Dr. HRoxan Hockeyon 12/30/2015.  On adjuvant systemic chemotherapy consisting of Carboplatin/Alimta beginning on 02/16/2016- 05/31/2016.  S/P XRT finishing on 08/01/2016.  Now on consolidative therapy with Imfinzi beginning on 09/04/2016.  Oncology history is updated.  Labs today: CBC diff, CMET.  I personally reviewed  and went over laboratory results with the patient.  The results are noted within this dictation.    Mild anemia is stable. Minimal hypokalemia is noted.  No intervention recommended at this time.  Problem list reviewed with patient and edited accordingly.  Medications are reviewed with the patient and edited accordingly.  B12 injection today as scheduled. B12 injection monthly.  Supportive therapy plan is reviewed.  Pre-treatment labs are ordered and on stand-by for treatment approval moving forward.  I have encouraged light exercise.  Return on 09/18/2016 for next cycle of Imfinzi.  Return in 3 weeks for follow-up and Imfinzi treatment.  More than 50% of the time spent with the patient was utilized for counseling and coordination of care.   ORDERS PLACED FOR THIS ENCOUNTER: No orders of the defined types were placed in  this encounter.   MEDICATIONS PRESCRIBED THIS ENCOUNTER: No orders of the defined types were placed in this encounter.   THERAPY PLAN:  Continue Imfinzi every 2 weeks for up to 52 weeks while being on the look out for side effects associated with immunotherapy.  All questions were answered. The patient knows to call the clinic with any problems, questions or concerns. We can certainly see the patient much sooner if necessary.  Patient and plan discussed with Dr. Twana First and she is in agreement with the aforementioned.   This note is electronically signed by: Doy Mince 09/11/2016 10:11 AM

## 2016-09-11 NOTE — Progress Notes (Signed)
[name] presents today for injection per MD orders. Vitamin b12 10250mg administered IM in right Upper Arm. Administration without incident. Patient tolerated well.  DStarr Lakepresented for Portacath access and flush. Portacath located left chest wall accessed with  H 20 needle. Good blood return present. Portacath flushed with 227mNS and 500U/50m60meparin and needle removed intact. Procedure without incident. Patient tolerated procedure well.

## 2016-09-18 ENCOUNTER — Encounter (HOSPITAL_COMMUNITY): Payer: Self-pay

## 2016-09-18 ENCOUNTER — Encounter (HOSPITAL_BASED_OUTPATIENT_CLINIC_OR_DEPARTMENT_OTHER): Payer: Medicare Other

## 2016-09-18 VITALS — BP 119/56 | HR 86 | Temp 97.3°F | Resp 18

## 2016-09-18 DIAGNOSIS — R918 Other nonspecific abnormal finding of lung field: Secondary | ICD-10-CM | POA: Diagnosis not present

## 2016-09-18 DIAGNOSIS — C3491 Malignant neoplasm of unspecified part of right bronchus or lung: Secondary | ICD-10-CM

## 2016-09-18 DIAGNOSIS — Z5112 Encounter for antineoplastic immunotherapy: Secondary | ICD-10-CM

## 2016-09-18 DIAGNOSIS — C342 Malignant neoplasm of middle lobe, bronchus or lung: Secondary | ICD-10-CM | POA: Diagnosis not present

## 2016-09-18 LAB — COMPREHENSIVE METABOLIC PANEL
ALT: 15 U/L — ABNORMAL LOW (ref 17–63)
AST: 23 U/L (ref 15–41)
Albumin: 3.2 g/dL — ABNORMAL LOW (ref 3.5–5.0)
Alkaline Phosphatase: 67 U/L (ref 38–126)
Anion gap: 7 (ref 5–15)
BUN: 11 mg/dL (ref 6–20)
CO2: 26 mmol/L (ref 22–32)
Calcium: 8.3 mg/dL — ABNORMAL LOW (ref 8.9–10.3)
Chloride: 103 mmol/L (ref 101–111)
Creatinine, Ser: 0.96 mg/dL (ref 0.61–1.24)
GFR calc Af Amer: >60 mL/min (ref >60)
GFR calc non Af Amer: >60 mL/min (ref >60)
Glucose, Bld: 150 mg/dL — ABNORMAL HIGH (ref 65–99)
Potassium: 3.5 mmol/L (ref 3.5–5.1)
Sodium: 136 mmol/L (ref 135–145)
Total Bilirubin: 0.5 mg/dL (ref 0.3–1.2)
Total Protein: 6 g/dL — ABNORMAL LOW (ref 6.5–8.1)

## 2016-09-18 LAB — CBC WITH DIFFERENTIAL/PLATELET
Basophils Absolute: 0 10*3/uL (ref 0.0–0.1)
Basophils Relative: 1 %
Eosinophils Absolute: 0.8 10*3/uL — ABNORMAL HIGH (ref 0.0–0.7)
Eosinophils Relative: 12 %
HCT: 31.5 % — ABNORMAL LOW (ref 39.0–52.0)
Hemoglobin: 10.9 g/dL — ABNORMAL LOW (ref 13.0–17.0)
Lymphocytes Relative: 23 %
Lymphs Abs: 1.6 10*3/uL (ref 0.7–4.0)
MCH: 30.6 pg (ref 26.0–34.0)
MCHC: 34.6 g/dL (ref 30.0–36.0)
MCV: 88.5 fL (ref 78.0–100.0)
Monocytes Absolute: 0.9 10*3/uL (ref 0.1–1.0)
Monocytes Relative: 13 %
Neutro Abs: 3.5 10*3/uL (ref 1.7–7.7)
Neutrophils Relative %: 51 %
Platelets: 225 10*3/uL (ref 150–400)
RBC: 3.56 MIL/uL — ABNORMAL LOW (ref 4.22–5.81)
RDW: 12.8 % (ref 11.5–15.5)
WBC: 6.8 10*3/uL (ref 4.0–10.5)

## 2016-09-18 MED ORDER — SODIUM CHLORIDE 0.9% FLUSH: 10.0000 mL | INTRAVENOUS | Status: DC | PRN

## 2016-09-18 MED ORDER — HEPARIN SOD (PORK) LOCK FLUSH 100 UNIT/ML IV SOLN
500.0000 [IU] | Freq: Once | INTRAVENOUS | Status: AC | PRN
  Administered 2016-09-18: 500 [IU]
  Filled 2016-09-18: qty 5

## 2016-09-18 MED ORDER — SODIUM CHLORIDE 0.9 % IV SOLN
Freq: Once | INTRAVENOUS | Status: AC
  Administered 2016-09-18: 12:00:00 via INTRAVENOUS

## 2016-09-18 MED ORDER — DURVALUMAB 500 MG/10ML IV SOLN
10.0000 mg/kg | Freq: Once | INTRAVENOUS | Status: AC
  Administered 2016-09-18: 920 mg via INTRAVENOUS
  Filled 2016-09-18: qty 18.4

## 2016-09-18 NOTE — Progress Notes (Signed)
Calcium level discussed with NP.  She instructs that the patient can add Tums to his daily medications if he desires, and that we will continue to monitor it with already scheduled labs.  All other labs stable and patient assessment normal.  Instructed to treat.  Patient tolerated infusion well.  VSS throughout.  Patient ambulatory and stable upon discharge from clinic.  Instructed to get follow up appointments from scheduler before he left clinic today.

## 2016-09-18 NOTE — Patient Instructions (Signed)
[facility] Discharge Instructions for Patients Receiving Chemotherapy   Beginning January 23rd 2017 lab work for the [facility] will be done in the  Main lab at [facility] on 1st floor. If you have a lab appointment with the [place] [facility] please come in thru the  Main Entrance and check in at the main information desk   Today you received the following chemotherapy agent: Imfinzi.   If you develop nausea and vomiting, or diarrhea that is not controlled by your medication, call the clinic.  The clinic phone number is [phone]. Office hours are Monday-Friday 8:30am-5:00pm.  BELOW ARE SYMPTOMS THAT SHOULD BE REPORTED IMMEDIATELY:  *FEVER GREATER THAN 101.0 F  *CHILLS WITH OR WITHOUT FEVER  NAUSEA AND VOMITING THAT IS NOT CONTROLLED WITH YOUR NAUSEA MEDICATION  *UNUSUAL SHORTNESS OF BREATH  *UNUSUAL BRUISING OR BLEEDING  TENDERNESS IN MOUTH AND THROAT WITH OR WITHOUT PRESENCE OF ULCERS  *URINARY PROBLEMS  *BOWEL PROBLEMS  UNUSUAL RASH Items with * indicate a potential emergency and should be followed up as soon as possible. If you have an emergency after office hours please contact your primary care physician or go to the nearest emergency department.  Please call the clinic during office hours if you have any questions or concerns.   You may also contact the Patient Navigator at [phone] should you have any questions or need assistance in obtaining follow up care.      Resources For Cancer Patients and their Caregivers ? American Cancer Society: Can assist with transportation, wigs, general needs, runs Look Good Feel Better.        [phone] ? Cancer Care: Provides financial assistance, online support groups, medication/co-pay assistance.  1-800-813-HOPE (331)307-5102) ? [place] Assists [place] cancer patients and their families through emotional , educational and financial support.   ([phone] ? [place] DSS Where to apply for food stamps, Medicaid and utility assistance. 480-015-6598 ? RCATS: Transportation to medical appointments. 401-440-7908 ? Social Security Administration: May apply for disability if have a Stage IV cancer. [phone] 516-006-6468 ? [employer], Disability and Transit Services: Assists with nutrition, care and transit needs. [phone]

## 2016-09-25 ENCOUNTER — Ambulatory Visit (HOSPITAL_COMMUNITY): Payer: Medicare Other

## 2016-10-02 ENCOUNTER — Other Ambulatory Visit (HOSPITAL_COMMUNITY): Payer: Self-pay | Admitting: Pharmacist

## 2016-10-02 ENCOUNTER — Encounter (HOSPITAL_COMMUNITY): Payer: Self-pay

## 2016-10-02 ENCOUNTER — Encounter (HOSPITAL_BASED_OUTPATIENT_CLINIC_OR_DEPARTMENT_OTHER): Payer: Medicare Other | Admitting: Oncology

## 2016-10-02 ENCOUNTER — Encounter (HOSPITAL_BASED_OUTPATIENT_CLINIC_OR_DEPARTMENT_OTHER): Payer: Medicare Other

## 2016-10-02 VITALS — BP 102/49 | HR 79 | Temp 97.8°F | Resp 18 | Wt 206.0 lb

## 2016-10-02 VITALS — BP 110/46 | HR 75 | Temp 97.5°F | Resp 18

## 2016-10-02 DIAGNOSIS — C3491 Malignant neoplasm of unspecified part of right bronchus or lung: Secondary | ICD-10-CM

## 2016-10-02 DIAGNOSIS — Z5112 Encounter for antineoplastic immunotherapy: Secondary | ICD-10-CM | POA: Diagnosis present

## 2016-10-02 DIAGNOSIS — C342 Malignant neoplasm of middle lobe, bronchus or lung: Secondary | ICD-10-CM

## 2016-10-02 DIAGNOSIS — R918 Other nonspecific abnormal finding of lung field: Secondary | ICD-10-CM | POA: Diagnosis not present

## 2016-10-02 LAB — CBC WITH DIFFERENTIAL/PLATELET
Basophils Absolute: 0 10*3/uL (ref 0.0–0.1)
Basophils Relative: 0 %
Eosinophils Absolute: 0.9 10*3/uL — ABNORMAL HIGH (ref 0.0–0.7)
Eosinophils Relative: 11 %
HCT: 32.5 % — ABNORMAL LOW (ref 39.0–52.0)
Hemoglobin: 11.1 g/dL — ABNORMAL LOW (ref 13.0–17.0)
Lymphocytes Relative: 29 %
Lymphs Abs: 2.2 10*3/uL (ref 0.7–4.0)
MCH: 30.5 pg (ref 26.0–34.0)
MCHC: 34.2 g/dL (ref 30.0–36.0)
MCV: 89.3 fL (ref 78.0–100.0)
Monocytes Absolute: 0.8 10*3/uL (ref 0.1–1.0)
Monocytes Relative: 10 %
Neutro Abs: 3.9 10*3/uL (ref 1.7–7.7)
Neutrophils Relative %: 50 %
Platelets: 203 10*3/uL (ref 150–400)
RBC: 3.64 MIL/uL — ABNORMAL LOW (ref 4.22–5.81)
RDW: 13 % (ref 11.5–15.5)
WBC: 7.7 10*3/uL (ref 4.0–10.5)

## 2016-10-02 LAB — COMPREHENSIVE METABOLIC PANEL
ALT: 16 U/L — ABNORMAL LOW (ref 17–63)
AST: 25 U/L (ref 15–41)
Albumin: 3.5 g/dL (ref 3.5–5.0)
Alkaline Phosphatase: 56 U/L (ref 38–126)
Anion gap: 7 (ref 5–15)
BUN: 16 mg/dL (ref 6–20)
CO2: 27 mmol/L (ref 22–32)
Calcium: 8.7 mg/dL — ABNORMAL LOW (ref 8.9–10.3)
Chloride: 103 mmol/L (ref 101–111)
Creatinine, Ser: 1.03 mg/dL (ref 0.61–1.24)
GFR calc Af Amer: >60 mL/min (ref >60)
GFR calc non Af Amer: >60 mL/min (ref >60)
Glucose, Bld: 68 mg/dL (ref 65–99)
Potassium: 3.5 mmol/L (ref 3.5–5.1)
Sodium: 137 mmol/L (ref 135–145)
Total Bilirubin: 0.6 mg/dL (ref 0.3–1.2)
Total Protein: 6.3 g/dL — ABNORMAL LOW (ref 6.5–8.1)

## 2016-10-02 LAB — TSH: TSH: 1.346 u[IU]/mL (ref 0.350–4.500)

## 2016-10-02 MED ORDER — SODIUM CHLORIDE 0.9 % IV SOLN
10.0000 mg/kg | Freq: Once | INTRAVENOUS | Status: AC
  Administered 2016-10-02: 920 mg via INTRAVENOUS
  Filled 2016-10-02: qty 18.4

## 2016-10-02 MED ORDER — SODIUM CHLORIDE 0.9 % IV SOLN
Freq: Once | INTRAVENOUS | Status: AC
  Administered 2016-10-02: 13:00:00 via INTRAVENOUS

## 2016-10-02 MED ORDER — HEPARIN SOD (PORK) LOCK FLUSH 100 UNIT/ML IV SOLN
500.0000 [IU] | Freq: Once | INTRAVENOUS | Status: AC | PRN
  Administered 2016-10-02: 500 [IU]

## 2016-10-02 NOTE — Progress Notes (Signed)
Tolerated infusion w/o adverse reaction.  Alert, in no distress.  VSS.  Discharged ambulatory in c/o spouse for transport home.  

## 2016-10-02 NOTE — Patient Instructions (Addendum)
[place] at [facility] Discharge Instructions  RECOMMENDATIONS MADE BY THE CONSULTANT AND ANY TEST RESULTS WILL BE SENT TO YOUR REFERRING PHYSICIAN.  You were seen today by Dr. Twana First Follow up in 2 weeks    Thank you for choosing [place] at [facility] to provide your oncology and hematology care.  To afford each patient quality time with our provider, please arrive at least 15 minutes before your scheduled appointment time.    If you have a lab appointment with the [place] please come in thru the  Main Entrance and check in at the main information desk  You need to re-schedule your appointment should you arrive 10 or more minutes late.  We strive to give you quality time with our providers, and arriving late affects you and other patients whose appointments are after yours.  Also, if you no show three or more times for appointments you may be dismissed from the clinic at the providers discretion.     Again, thank you for choosing [facility].  Our hope is that these requests will decrease the amount of time that you wait before being seen by our physicians.       _____________________________________________________________  Should you have questions after your visit to [place], please contact our office at [phone] between the hours of 8:30 a.m. and 4:30 p.m.  Voicemails left after 4:30 p.m. will not be returned until the following business day.  For prescription refill requests, have your pharmacy contact our office.       Resources For Cancer Patients and their Caregivers ? American Cancer Society: Can assist with transportation, wigs, general needs, runs Look Good Feel Better.        [phone] ? Cancer Care: Provides financial assistance, online support groups, medication/co-pay assistance.  1-800-813-HOPE 985-677-8881) ? [place] Assists [place]  cancer patients and their families through emotional , educational and financial support.  4021098745 ? [place] DSS Where to apply for food stamps, Medicaid and utility assistance. [phone] ? RCATS: Transportation to medical appointments. [phone] ? Social Security Administration: May apply for disability if have a Stage IV cancer. [phone] ? [employer], Disability and Transit Services: Assists with nutrition, care and transit needs. [place] Support Programs: '@10RELATIVEDAYS'$ @ > Cancer Support Group  2nd Tuesday of the month 1pm-2pm, Journey Room  > Creative Journey  3rd Tuesday of the month 1130am-1pm, Journey Room  > Look Good Feel Better  1st Wednesday of the month 10am-12 noon, Journey Room (Call [place] to register [phone])

## 2016-10-02 NOTE — Progress Notes (Signed)
[facility], MD [address] [place] [address]  No diagnosis found.  Progress Note  CURRENT THERAPY:  Consolidative therapy with Imfinzi beginning on 09/04/2016.  B12 monthly.  INTERVAL HISTORY: [name] 83 y.o. male returns for followup of Stage IIIA (T1AN2M0) adenocarcinoma of right lung, S/P R VATS, RML lobectomy, and mediastinal lymph node dissection by Dr. Roxan Hockey on 12/30/2015.  He is S/P 6 cycles of Carboplatin/Alimta 02/16/2016- 05/31/2016).  Now on consolidative therapy with Imfinzi beginning on 09/04/2016.    Adenocarcinoma of right lung, stage 3 (HCC)   11/10/2015 Imaging    13 mm spiculated lesion seen in right middle lobe.      11/30/2015 PET scan    Spiculated right middle lobe nodule is mildly hypermetabolic and most consistent with a stage IA adenocarcinoma.      12/30/2015 Surgery    Right middle lobectomy and node dissection      12/30/2015 Procedure    Right video-assisted thoracoscopy, Thoracoscopic right middle lobectomy, Mediastinal lymph node dissection, and On-Q local anesthetic catheter placement.      01/02/2016 Pathology Results    1. Lung, resection (segmental or lobe), Right Middle Lobe - INVASIVE ADENOCARCINOMA, WELL DIFFERENTIATED, SPANNING 1.2 CM. - THE SURGICAL RESECTION MARGINS ARE NEGATIVE FOR CARCINOMA. 2. Lymph node, biopsy, Level 12 - METASTATIC CARCINOMA IN 1 OF 1 LYMPH NODE (1/1). 3. Lymph node, biopsy, Level 12 #2 - METASTATIC CARCINOMA IN 1 OF 1 LYMPH NODE (1/1). 4. Lymph node, biopsy, Level 7 - METASTATIC CARCINOMA IN 1 OF 1 LYMPH NODE (1/1/). 5. Lymph node, biopsy, Level 7 #2 - METASTATIC CARCINOMA IN 1 OF 1 LYMPH NODE (1/1). 6. Lymph node, biopsy, Level 7 #3 - METASTATIC CARCINOMA IN 1 OF 1 LYMPH NODE (1/1). 7. Lymph node, biopsy, Level 7 #4 - METASTATIC CARCINOMA IN 1 OF 1 LYMPH NODE (1/1). 8. Lymph node, biopsy, 4R - THERE IS NO EVIDENCE OF CARCINOMA IN 1 OF 1 LYMPH NODE (0/1). 9. Lymph node, biopsy, 4R #2 -  THERE IS NO EVIDENCE OF CARCINOMA IN 1 OF 1 LYMPH NODE (0/1).      01/05/2016 Pathology Results    PDL1 NEGATIVE- tumor proportion score of 0%.       01/05/2016 Pathology Results    Genomic alterations identified: BRAF V600E, KIT amplification, PDGFRA amplification, CDKN2A/B loss, TP53 S80f*33.  Additional findings: MSI-STABLE.  No reportable alterations identified: EGFR, KRAS, ALK, MET, RET, ERBB2, ROS1      02/09/2016 Procedure    Port placed by IR.      02/16/2016 - 05/31/2016 Chemotherapy    Carboplatin/Pemetrexed x 6 cycles        - 08/01/2016 Radiation Therapy    [place], [place]      08/28/2016 Imaging    CT CAP- No evidence of recurrent or metastatic carcinoma within the chest, abdomen, or pelvis.  Stable incidental findings include a absent, small benign right adrenal adenoma, sigmoid diverticulosis, and aortic atherosclerosis.      09/04/2016 -  Chemotherapy    Imfinzi immunotherapy up to 52 weeks.        [name] today for continuing follow up. He is scheduled to receive cycle 3 Durvalumab q14d today.   He states he has been tolerating Imfinzi well.  He has chronic vertigo which he takes medication for, but otherwise has no new complaints.  He reports that once in a while he gets chest pain and he takes a muscle relaxer they prescribed him  for it and it resolves the pain.  He continues to have constipation, but he follows the food recipe in their cancer book for it and it resolves.  He notes he is sleeping ok.   Denies fatigue, loss of appetite, abdominal pain,  diarrhea, and sob.     Review of Systems  Constitutional: Negative.  Negative for malaise/fatigue.       Denies loss of appetite.  HENT: Negative.   Eyes: Negative.   Respiratory: Negative.  Negative for shortness of breath.   Cardiovascular: Positive for chest pain (intermittent, takes medicine for it.).  Gastrointestinal: Positive for constipation. Negative for abdominal pain and  diarrhea.  Genitourinary: Negative.   Musculoskeletal: Positive for back pain (chronic, at baseline).  Skin: Negative.   Neurological: Negative.   Endo/Heme/Allergies: Negative.   Psychiatric/Behavioral: Negative.  The patient does not have insomnia.     Past Medical History:  Diagnosis Date  . Adenocarcinoma of lung, stage 3 (HCC)    Stage IIIA  . Adenocarcinoma of right lung, stage 3 ([place]) 12/02/2015  . Arthritis   . Atrial fibrillation, transient ([place])    transient postop, < 24 hours  . B12 deficiency   . Cyst of scrotum   . Diverticulitis   . GERD (gastroesophageal reflux disease)   . Hernia   . History of pneumonia   . Hyperlipidemia   . Lung nodule    Spiculated right middle lobe nodule, hypermetabolic  . Type 2 diabetes mellitus ([place])     Past Surgical History:  Procedure Laterality Date  . BACK SURGERY    . CHOLECYSTECTOMY    . COLONOSCOPY N/A 11/24/2015   Procedure: COLONOSCOPY;  Surgeon: Rogene Houston, MD;  Location: [facility];  Service: Endoscopy;  Laterality: N/A;  730  . COLONOSCOPY W/ POLYPECTOMY     x 5  . CYST EXCISION     Scrotum  . EYE SURGERY Bilateral    Cataract with Lens  . HERNIA REPAIR Right    Inguinal  . IR GENERIC HISTORICAL  02/09/2016   IR US GUIDE VASC ACCESS RIGHT 02/09/2016 Arne Cleveland, MD [facility]INTER[facility]  . IR GENERIC HISTORICAL  02/09/2016   IR FLUORO GUIDE CV LINE RIGHT 02/09/2016 Arne Cleveland, MD [facility]  . LUMBAR DISC SURGERY     per patient report  . VIDEO ASSISTED THORACOSCOPY (VATS)/ LOBECTOMY Right 12/30/2015   Procedure: VIDEO ASSISTED THORACOSCOPY (VATS)/RIGHT MIDDLE LOBECTOMY;  Surgeon: Melrose Nakayama, MD;  Location: [facility];  Service: Thoracic;  Laterality: Right;    Family History  Problem Relation Age of Onset  . Colon cancer Brother     Social History   Social History  . Marital status: Married    Spouse name: N/A  . Number of children: N/A  . Years of education: N/A   Social History Main  Topics  . Smoking status: Former Smoker    Types: Cigarettes    Quit date: 05/27/2004  . Smokeless tobacco: Never Used  . Alcohol use No  . Drug use: No  . Sexual activity: Not Asked     Comment: married   Other Topics Concern  . None   Social History Narrative  . None     PHYSICAL EXAMINATION  ECOG PERFORMANCE STATUS: 1 - Symptomatic but completely ambulatory  Vitals:   10/02/16 1041  BP: (!) 102/49  Pulse: 79  Resp: 18  Temp: 97.8 F (36.6 C)   Filed Weights   10/02/16 1041  Weight: 206 lb (  93.4 kg)     Physical Exam  Constitutional: He is oriented to person, place, and time and well-developed, well-nourished, and in no distress.  HENT:  Head: Normocephalic and atraumatic.  Eyes: Conjunctivae and EOM are normal. Pupils are equal, round, and reactive to light.  Neck: Normal range of motion. Neck supple.  Cardiovascular: Normal rate, regular rhythm and normal heart sounds.   Pulmonary/Chest: Effort normal and breath sounds normal.  Abdominal: Soft. Bowel sounds are normal.  Musculoskeletal: Normal range of motion.  Neurological: He is alert and oriented to person, place, and time. Gait normal.  Skin: Skin is warm and dry.  Nursing note and vitals reviewed.    LABORATORY DATA: CBC    Component Value Date/Time   WBC 6.8 09/18/2016 1127   RBC 3.56 (L) 09/18/2016 1127   HGB 10.9 (L) 09/18/2016 1127   HCT 31.5 (L) 09/18/2016 1127   PLT 225 09/18/2016 1127   MCV 88.5 09/18/2016 1127   MCH 30.6 09/18/2016 1127   MCHC 34.6 09/18/2016 1127   RDW 12.8 09/18/2016 1127   LYMPHSABS 1.6 09/18/2016 1127   MONOABS 0.9 09/18/2016 1127   EOSABS 0.8 (H) 09/18/2016 1127   BASOSABS 0.0 09/18/2016 1127      Chemistry      Component Value Date/Time   NA 136 09/18/2016 1127   K 3.5 09/18/2016 1127   CL 103 09/18/2016 1127   CO2 26 09/18/2016 1127   BUN 11 09/18/2016 1127   CREATININE 0.96 09/18/2016 1127   CREATININE 0.95 11/03/2015 1452      Component Value  Date/Time   CALCIUM 8.3 (L) 09/18/2016 1127   ALKPHOS 67 09/18/2016 1127   AST 23 09/18/2016 1127   ALT 15 (L) 09/18/2016 1127   BILITOT 0.5 09/18/2016 1127     Lab Results  Component Value Date   TSH 2.693 09/04/2016     PENDING LABS:   RADIOGRAPHIC STUDIES: I have personally reviewed the radiological images as listed and agreed with the findings in the report. No results found.  CT CHEST, ABDOMEN, AND PELVIS WITH CONTRAST 08/28/2016  IMPRESSION: Postop changes in right hemithorax. No evidence of recurrent or metastatic carcinoma within the chest, abdomen, or pelvis.  Stable incidental findings include a absent, small benign right adrenal adenoma, sigmoid diverticulosis, and aortic atherosclerosis  PATHOLOGY:    ASSESSMENT AND PLAN:  Stage IIIA ([id number]) adenocarcinoma of right lung, S/P R VATS, RML lobectomy, and mediastinal lymph node dissection by Dr. Roxan Hockey on 12/30/2015.  On adjuvant systemic chemotherapy consisting of Carboplatin/Alimta beginning on 02/16/2016- 05/31/2016.  S/P XRT finishing on 08/01/2016.  Now on consolidative therapy with Imfinzi beginning on 09/04/2016.  PLAN: Patient tolerating treatment very well.  Continue with Imfinzi every 2 weeks for up to 52 weeks. He is not having any side effects associated with immunotherapy.  Repeat restaging CT C/A/P in 3 months from last scan, in late June 2018.  RTC in 2 weeks for follow up.   THERAPY PLAN:  Continue Imfinzi every 2 weeks for up to 52 weeks while being on the look out for side effects associated with immunotherapy.    All questions were answered. The patient knows to call the clinic with any problems, questions or concerns. We can certainly see the patient much sooner if necessary.  This document serves as a record of services personally performed by Twana First, MD. It was created on her behalf by Shirlean Mylar, a trained medical scribe. The creation of this record is based on  the  scribe's personal observations and the provider's statements to them. This document has been checked and approved by the attending provider.  I have reviewed the above documentation for accuracy and completeness and I agree with the above.  This note is electronically signed by: Mikey College 10/02/2016 11:13 AM

## 2016-10-02 NOTE — Patient Instructions (Signed)
[facility] Discharge Instructions for Patients Receiving Chemotherapy   Beginning January 23rd 2017 lab work for the [facility] will be done in the  Main lab at [facility] on 1st floor. If you have a lab appointment with the [place] please come in thru the  Main Entrance and check in at the main information desk   Today you received the following chemotherapy agents:  Imfinzi  If you develop nausea and vomiting, or diarrhea that is not controlled by your medication, call the clinic.  The clinic phone number is [phone]. Office hours are Monday-Friday 8:30am-5:00pm.  BELOW ARE SYMPTOMS THAT SHOULD BE REPORTED IMMEDIATELY:  *FEVER GREATER THAN 101.0 F  *CHILLS WITH OR WITHOUT FEVER  NAUSEA AND VOMITING THAT IS NOT CONTROLLED WITH YOUR NAUSEA MEDICATION  *UNUSUAL SHORTNESS OF BREATH  *UNUSUAL BRUISING OR BLEEDING  TENDERNESS IN MOUTH AND THROAT WITH OR WITHOUT PRESENCE OF ULCERS  *URINARY PROBLEMS  *BOWEL PROBLEMS  UNUSUAL RASH Items with * indicate a potential emergency and should be followed up as soon as possible. If you have an emergency after office hours please contact your primary care physician or go to the nearest emergency department.  Please call the clinic during office hours if you have any questions or concerns.   You may also contact the Patient Navigator at [phone] should you have any questions or need assistance in obtaining follow up care.      Resources For Cancer Patients and their Caregivers ? American Cancer Society: Can assist with transportation, wigs, general needs, runs Look Good Feel Better.        516-732-8757 ? Cancer Care: Provides financial assistance, online support groups, medication/co-pay assistance.  1-800-813-HOPE (929)882-0561) ? [place] Assists [place] cancer patients and their families through emotional , educational and financial support.   [phone] ? [place] DSS Where to apply for food stamps, Medicaid and utility assistance. [phone] ? [employer]: Transportation to medical appointments. 201-115-1345 ? Social Security Administration: May apply for disability if have a Stage IV cancer. [phone] ? [employer], Disability and Transit Services: Assists with nutrition, care and transit needs. [phone]

## 2016-10-04 ENCOUNTER — Other Ambulatory Visit (HOSPITAL_COMMUNITY): Payer: Self-pay | Admitting: Pharmacist

## 2016-10-11 ENCOUNTER — Other Ambulatory Visit (HOSPITAL_COMMUNITY): Payer: Self-pay | Admitting: Pharmacist

## 2016-10-16 ENCOUNTER — Encounter (HOSPITAL_COMMUNITY): Payer: Medicare Other | Attending: Hematology & Oncology

## 2016-10-16 ENCOUNTER — Encounter (HOSPITAL_BASED_OUTPATIENT_CLINIC_OR_DEPARTMENT_OTHER): Payer: Medicare Other | Admitting: Oncology

## 2016-10-16 ENCOUNTER — Ambulatory Visit (HOSPITAL_COMMUNITY): Payer: Medicare Other | Admitting: Adult Health

## 2016-10-16 ENCOUNTER — Encounter (HOSPITAL_COMMUNITY): Payer: Self-pay

## 2016-10-16 ENCOUNTER — Other Ambulatory Visit (HOSPITAL_COMMUNITY): Payer: Self-pay | Admitting: Oncology

## 2016-10-16 VITALS — BP 125/56 | HR 86 | Temp 97.5°F | Resp 18

## 2016-10-16 DIAGNOSIS — R918 Other nonspecific abnormal finding of lung field: Secondary | ICD-10-CM | POA: Diagnosis not present

## 2016-10-16 DIAGNOSIS — C342 Malignant neoplasm of middle lobe, bronchus or lung: Secondary | ICD-10-CM

## 2016-10-16 DIAGNOSIS — Z5112 Encounter for antineoplastic immunotherapy: Secondary | ICD-10-CM | POA: Diagnosis present

## 2016-10-16 DIAGNOSIS — D649 Anemia, unspecified: Secondary | ICD-10-CM | POA: Diagnosis present

## 2016-10-16 DIAGNOSIS — C3491 Malignant neoplasm of unspecified part of right bronchus or lung: Secondary | ICD-10-CM

## 2016-10-16 DIAGNOSIS — E538 Deficiency of other specified B group vitamins: Secondary | ICD-10-CM | POA: Diagnosis present

## 2016-10-16 LAB — COMPREHENSIVE METABOLIC PANEL
ALT: 17 U/L (ref 17–63)
AST: 25 U/L (ref 15–41)
Albumin: 3.3 g/dL — ABNORMAL LOW (ref 3.5–5.0)
Alkaline Phosphatase: 71 U/L (ref 38–126)
Anion gap: 8 (ref 5–15)
BUN: 18 mg/dL (ref 6–20)
CO2: 27 mmol/L (ref 22–32)
Calcium: 8.9 mg/dL (ref 8.9–10.3)
Chloride: 101 mmol/L (ref 101–111)
Creatinine, Ser: 1.14 mg/dL (ref 0.61–1.24)
GFR calc Af Amer: >60 mL/min (ref >60)
GFR calc non Af Amer: 57 mL/min — ABNORMAL LOW (ref >60)
Glucose, Bld: 215 mg/dL — ABNORMAL HIGH (ref 65–99)
Potassium: 3.4 mmol/L — ABNORMAL LOW (ref 3.5–5.1)
Sodium: 136 mmol/L (ref 135–145)
Total Bilirubin: 0.5 mg/dL (ref 0.3–1.2)
Total Protein: 6.3 g/dL — ABNORMAL LOW (ref 6.5–8.1)

## 2016-10-16 LAB — CBC WITH DIFFERENTIAL/PLATELET
Basophils Absolute: 0 10*3/uL (ref 0.0–0.1)
Basophils Relative: 0 %
Eosinophils Absolute: 0.9 10*3/uL — ABNORMAL HIGH (ref 0.0–0.7)
Eosinophils Relative: 11 %
HCT: 34.3 % — ABNORMAL LOW (ref 39.0–52.0)
Hemoglobin: 11.4 g/dL — ABNORMAL LOW (ref 13.0–17.0)
Lymphocytes Relative: 20 %
Lymphs Abs: 1.7 10*3/uL (ref 0.7–4.0)
MCH: 29.8 pg (ref 26.0–34.0)
MCHC: 33.2 g/dL (ref 30.0–36.0)
MCV: 89.6 fL (ref 78.0–100.0)
Monocytes Absolute: 0.8 10*3/uL (ref 0.1–1.0)
Monocytes Relative: 10 %
Neutro Abs: 4.8 10*3/uL (ref 1.7–7.7)
Neutrophils Relative %: 59 %
Platelets: 212 10*3/uL (ref 150–400)
RBC: 3.83 MIL/uL — ABNORMAL LOW (ref 4.22–5.81)
RDW: 12.5 % (ref 11.5–15.5)
WBC: 8.2 10*3/uL (ref 4.0–10.5)

## 2016-10-16 MED ORDER — SODIUM CHLORIDE 0.9% FLUSH
10.0000 mL | INTRAVENOUS | Status: DC | PRN
  Administered 2016-10-16: 10 mL
  Filled 2016-10-16: qty 10

## 2016-10-16 MED ORDER — HEPARIN SOD (PORK) LOCK FLUSH 100 UNIT/ML IV SOLN
500.0000 [IU] | Freq: Once | INTRAVENOUS | Status: AC | PRN
  Administered 2016-10-16: 500 [IU]
  Filled 2016-10-16: qty 5

## 2016-10-16 MED ORDER — CYANOCOBALAMIN 1000 MCG/ML IJ SOLN
1000.0000 ug | Freq: Once | INTRAMUSCULAR | Status: AC
  Administered 2016-10-16: 1000 ug via INTRAMUSCULAR
  Filled 2016-10-16: qty 1

## 2016-10-16 MED ORDER — SODIUM CHLORIDE 0.9 % IV SOLN
Freq: Once | INTRAVENOUS | Status: AC
  Administered 2016-10-16: 10:00:00 via INTRAVENOUS

## 2016-10-16 MED ORDER — SODIUM CHLORIDE 0.9 % IV SOLN
10.0000 mg/kg | Freq: Once | INTRAVENOUS | Status: AC
  Administered 2016-10-16: 920 mg via INTRAVENOUS
  Filled 2016-10-16: qty 18.4

## 2016-10-16 NOTE — Patient Instructions (Signed)
[facility] Discharge Instructions for Patients Receiving Chemotherapy   Beginning January 23rd 2017 lab work for the Cancer Center will be done in the  Main lab at [place] on 1st floor. If you have a lab appointment with the Cancer Center please come in thru the  Main Entrance and check in at the main information desk   Today you received the following chemotherapy agents Imfinzi as well as Vit B12 injection Follow-up as scheduled. Call clinic for any questions or concerns  To help prevent nausea and vomiting after your treatment, we encourage you to take your nausea medication   If you develop nausea and vomiting, or diarrhea that is not controlled by your medication, call the clinic.  The clinic phone number is [phone]. Office hours are Monday-Friday 8:30am-5:00pm.  BELOW ARE SYMPTOMS THAT SHOULD BE REPORTED IMMEDIATELY:  *FEVER GREATER THAN 101.0 F  *CHILLS WITH OR WITHOUT FEVER  NAUSEA AND VOMITING THAT IS NOT CONTROLLED WITH YOUR NAUSEA MEDICATION  *UNUSUAL SHORTNESS OF BREATH  *UNUSUAL BRUISING OR BLEEDING  TENDERNESS IN MOUTH AND THROAT WITH OR WITHOUT PRESENCE OF ULCERS  *URINARY PROBLEMS  *BOWEL PROBLEMS  UNUSUAL RASH Items with * indicate a potential emergency and should be followed up as soon as possible. If you have an emergency after office hours please contact your primary care physician or go to the nearest emergency department.  Please call the clinic during office hours if you have any questions or concerns.   You may also contact the Patient Navigator at [phone] should you have any questions or need assistance in obtaining follow up care.      Resources For Cancer Patients and their Caregivers ? American Cancer Society: Can assist with transportation, wigs, general needs, runs Look Good Feel Better.        1-888-227-6333 ? Cancer Care: Provides financial assistance, online support groups, medication/co-pay  assistance.  1-800-813-HOPE (4673) ? [facility] Assists [place] cancer patients and their families through emotional , educational and financial support.  336-427-4357 ? [place] DSS Where to apply for food stamps, Medicaid and utility assistance. 336-342-1394 ? [employer]: Transportation to medical appointments. [phone] ? Social Security Administration: May apply for disability if have a Stage IV cancer. [phone] 1-800-772-1213 ? [place] A[employer], Disability and Transit Services: Assists with nutrition, care and transit needs. [phone]         

## 2016-10-16 NOTE — Patient Instructions (Signed)
[place] at [facility] Discharge Instructions  RECOMMENDATIONS MADE BY THE CONSULTANT AND ANY TEST RESULTS WILL BE SENT TO YOUR REFERRING PHYSICIAN.  You were seen today by [name] PA-C. Treatment today and again in 2 weeks. Return in 4 weeks for follow up.   Thank you for choosing [place] at [facility] to provide your oncology and hematology care.  To afford each patient quality time with our provider, please arrive at least 15 minutes before your scheduled appointment time.    If you have a lab appointment with the [place] please come in thru the  Main Entrance and check in at the main information desk  You need to re-schedule your appointment should you arrive 10 or more minutes late.  We strive to give you quality time with our providers, and arriving late affects you and other patients whose appointments are after yours.  Also, if you no show three or more times for appointments you may be dismissed from the clinic at the providers discretion.     Again, thank you for choosing [facility].  Our hope is that these requests will decrease the amount of time that you wait before being seen by our physicians.       _____________________________________________________________  Should you have questions after your visit to [facility], please contact our office at [phone] between the hours of 8:30 a.m. and 4:30 p.m.  Voicemails left after 4:30 p.m. will not be returned until the following business day.  For prescription refill requests, have your pharmacy contact our office.       Resources For Cancer Patients and their Caregivers ? American Cancer Society: Can assist with transportation, wigs, general needs, runs Look Good Feel Better.        8181465895 ? Cancer Care: Provides financial assistance, online support groups, medication/co-pay assistance.  1-800-813-HOPE 6140602063) ? [place] Assists [place] cancer patients and their families through emotional , educational and financial support.  479-373-6313 ? [place] DSS Where to apply for food stamps, Medicaid and utility assistance. 956-041-1936 ? [employer]: Transportation to medical appointments. 7821425128 ? Social Security Administration: May apply for disability if have a Stage IV cancer. 859-320-8468 712-062-9021 ? [employer], Disability and Transit Services: Assists with nutrition, care and transit needs. [place] Support Programs: '@10RELATIVEDAYS'$ @ > Cancer Support Group  2nd Tuesday of the month 1pm-2pm, Journey Room  > Creative Journey  3rd Tuesday of the month 1130am-1pm, Journey Room  > Look Good Feel Better  1st Wednesday of the month 10am-12 noon, Journey Room (Call [place] to register [phone])

## 2016-10-16 NOTE — Assessment & Plan Note (Addendum)
Stage IIIA ([id number]) adenocarcinoma of right lung, S/P R VATS, RML lobectomy, and mediastinal lymph node dissection by Dr. Roxan Hockey on 12/30/2015.  He is S/P 6 cycles of Carboplatin/Alimta (02/16/2016- 05/31/2016) and concomitant radiation in [place], [place].  Now on consolidative therapy with Imfinzi beginning on 09/04/2016.  Patient's case is interesting as pre-surgical imaging provided information concerning for a Stage I bronchogenic carcinoma.  PET imaging did not demonstrate any hypermetabolic lymph nodes.  At time of surgery, mediastinal lymph node staging was performed and Stage III disease was pathologically proven.  Therefore, defining complete resection has been difficult.  He is on consolidative Imfinzi and his case has been discussed with [employer].  We believe, based upon a multidisciplinary recommendations, the immunotherapy with Imfinzi is beneficial and the patient is advised of the financial risks associated with ongoing Imfinzi.  Case was discussed with Dr. Roxan Hockey, CTS, and he deems the patient incompletely resected given the lack of PET avidity and lack of complete lymph node dissection.  Pre-treatment labs: CBC diff, CMET.  I personally reviewed and went over laboratory results with the patient.  The results are noted within this dictation.  Labs satisfy treatment parameters.  Pre-treatment labs: CBC diff, CMET.  TSH every 4 weeks.  During infusion today, nursing will monitor patient for acute toxicities/side effects of immunotherapy in accordance to chemotherapy protocol.  He is due for restaging CT in June 2018.  Order is placed for CT of chest at the end of June 2018.  Return in 4 weeks for follow-up.

## 2016-10-16 NOTE — Progress Notes (Addendum)
[address], [place] [address]  Adenocarcinoma of right lung, stage 3 [facility])  B12 deficiency  CURRENT THERAPY: Consolidative therapy with Imfinzi beginning on 09/04/2016.  B12 injection monthly.  INTERVAL HISTORY: MARKEEM NOREEN 83 y.o. male returns for followup of Stage IIIA ([id number]) adenocarcinoma of right lung, S/P R VATS, RML lobectomy, and mediastinal lymph node dissection by Dr. Roxan Hockey on 12/30/2015.  He is S/P 6 cycles of Carboplatin/Alimta (02/16/2016- 05/31/2016) and concomitant radiation in [place], [place].  Now on consolidative therapy with Imfinzi beginning on 09/04/2016. AND B12 deficiency, on monthly B12 injections.    Adenocarcinoma of right lung, stage 3 (HCC)   11/10/2015 Imaging    13 mm spiculated lesion seen in right middle lobe.      11/30/2015 PET scan    Spiculated right middle lobe nodule is mildly hypermetabolic and most consistent with a stage IA adenocarcinoma.      12/30/2015 Surgery    Right middle lobectomy and node dissection      12/30/2015 Procedure    Right video-assisted thoracoscopy, Thoracoscopic right middle lobectomy, Mediastinal lymph node dissection, and On-Q local anesthetic catheter placement.      01/02/2016 Pathology Results    1. Lung, resection (segmental or lobe), Right Middle Lobe - INVASIVE ADENOCARCINOMA, WELL DIFFERENTIATED, SPANNING 1.2 CM. - THE SURGICAL RESECTION MARGINS ARE NEGATIVE FOR CARCINOMA. 2. Lymph node, biopsy, Level 12 - METASTATIC CARCINOMA IN 1 OF 1 LYMPH NODE (1/1). 3. Lymph node, biopsy, Level 12 #2 - METASTATIC CARCINOMA IN 1 OF 1 LYMPH NODE (1/1). 4. Lymph node, biopsy, Level 7 - METASTATIC CARCINOMA IN 1 OF 1 LYMPH NODE (1/1/). 5. Lymph node, biopsy, Level 7 #2 - METASTATIC CARCINOMA IN 1 OF 1 LYMPH NODE (1/1). 6. Lymph node, biopsy, Level 7 #3 - METASTATIC CARCINOMA IN 1 OF 1 LYMPH NODE (1/1). 7. Lymph node, biopsy, Level 7 #4 - METASTATIC CARCINOMA IN 1 OF 1 LYMPH NODE  (1/1). 8. Lymph node, biopsy, 4R - THERE IS NO EVIDENCE OF CARCINOMA IN 1 OF 1 LYMPH NODE (0/1). 9. Lymph node, biopsy, 4R #2 - THERE IS NO EVIDENCE OF CARCINOMA IN 1 OF 1 LYMPH NODE (0/1).      01/05/2016 Pathology Results    PDL1 NEGATIVE- tumor proportion score of 0%.       01/05/2016 Pathology Results    Genomic alterations identified: BRAF V600E, KIT amplification, PDGFRA amplification, CDKN2A/B loss, TP53 S78f*33.  Additional findings: MSI-STABLE.  No reportable alterations identified: EGFR, KRAS, ALK, MET, RET, ERBB2, ROS1      02/09/2016 Procedure    Port placed by IR.      02/16/2016 - 05/31/2016 Chemotherapy    Carboplatin/Pemetrexed x 6 cycles        - 08/01/2016 Radiation Therapy    [place], [place]      08/28/2016 Imaging    CT CAP- No evidence of recurrent or metastatic carcinoma within the chest, abdomen, or pelvis.  Stable incidental findings include a absent, small benign right adrenal adenoma, sigmoid diverticulosis, and aortic atherosclerosis.      09/04/2016 -  Chemotherapy    Imfinzi immunotherapy up to 52 weeks.         HPI Elements NSCLC  Location: Right middle lobe and mediastinal lymph nodes  Quality: Adenocarcinoma  Severity: Stage IIIA  Duration: Dx on 01/02/2016  Context: S/P concomitant chemoXRT with Carboplatin/Pemetrexed x 6 cycles.  Now on consolidative immunotherapy with Imfinzi.  Timing: Incidentally found on  imaging.  Modifying Factors: H/O tobacco abuse  Associated Signs & Symptoms:    He is tolerating immunotherapy without any complaints associated with cough, SOB, change in bowel habits, etc.  We have reviewed the side effects of immunotherapy and he denies all of them: Immunotherapy side effects:  Lung problems (pneumonitis):   Shortness of breath   Chest pain    New or worse cough  Intestinal problems (colitis):   Diarrhea or more bowel movements than usual   Stools that are black, tarry, sticky, or have bloodwork mucous   Severe  stomach (abdominal) pain or tenderness  Liver problems (hepatitis):   Yellowing of skin or the sclera of eyes   Nausea or vomiting   Pain on the right side of abdomen   Dark urine   Feeling less hungry than usual   Bleeding or bruising more easily than normal  Hormone gland problems (thyroid, pituitary, adrenal glands, and pancreas):   Rapid heartbeat   Weight loss or weight gain   Increased sweating   Feeling more hungry or thirsty   Urinating more than usual   Hair loss   Feeling cold   Constipation   Voice changes (voice getting deeper)   Muscle aches   Dizziness or fainting   Headaches that will not go away, or unusual headaches  Kidney problems (nephritis and kidney failure):   Change in amount of urine or color of urine   Problems and other organs:   Rash   Change in eyesight   Severe or persistent muscle or joint pains   Severe muscle weakness  Infusion (IV) reactions:   Chills or shaking   Shortness of breath or wheezing   Itching or rash   Flushing   Dizziness   Fevers   Feeling like passing out  Review of Systems  Constitutional: Negative.  Negative for chills, fever and weight loss.  HENT: Negative.   Eyes: Negative.   Respiratory: Negative.  Negative for cough.   Cardiovascular: Negative.  Negative for chest pain.  Gastrointestinal: Negative.  Negative for blood in stool, constipation, diarrhea, melena, nausea and vomiting.  Genitourinary: Negative.   Musculoskeletal: Negative.   Skin: Negative.   Neurological: Negative.  Negative for weakness.  Endo/Heme/Allergies: Negative.   Psychiatric/Behavioral: Negative.     Past Medical History:  Diagnosis Date  . Adenocarcinoma of lung, stage 3 (HCC)    Stage IIIA  . Adenocarcinoma of right lung, stage 3 ([place]) 12/02/2015  . Arthritis   . Atrial fibrillation, transient ([place])    transient postop, < 24 hours  . B12 deficiency   . Cyst of scrotum   . Diverticulitis   . GERD (gastroesophageal reflux  disease)   . Hernia   . History of pneumonia   . Hyperlipidemia   . Lung nodule    Spiculated right middle lobe nodule, hypermetabolic  . Type 2 diabetes mellitus ([place])     Past Surgical History:  Procedure Laterality Date  . BACK SURGERY    . CHOLECYSTECTOMY    . COLONOSCOPY N/A 11/24/2015   Procedure: COLONOSCOPY;  Surgeon: Rogene Houston, MD;  Location: [facility];  Service: Endoscopy;  Laterality: N/A;  730  . COLONOSCOPY W/ POLYPECTOMY     x 5  . CYST EXCISION     Scrotum  . EYE SURGERY Bilateral    Cataract with Lens  . HERNIA REPAIR Right    Inguinal  . IR GENERIC HISTORICAL  02/09/2016   IR US GUIDE VASC  ACCESS RIGHT 02/09/2016 Arne Cleveland, MD [facility]  . IR GENERIC HISTORICAL  02/09/2016   IR FLUORO GUIDE CV LINE RIGHT 02/09/2016 Arne Cleveland, MD [facility]  . LUMBAR DISC SURGERY     per patient report  . VIDEO ASSISTED THORACOSCOPY (VATS)/ LOBECTOMY Right 12/30/2015   Procedure: VIDEO ASSISTED THORACOSCOPY (VATS)/RIGHT MIDDLE LOBECTOMY;  Surgeon: Melrose Nakayama, MD;  Location: [facility];  Service: Thoracic;  Laterality: Right;    Family History  Problem Relation Age of Onset  . Colon cancer Brother     Social History   Social History  . Marital status: Married    Spouse name: N/A  . Number of children: N/A  . Years of education: N/A   Social History Main Topics  . Smoking status: Former Smoker    Types: Cigarettes    Quit date: 05/27/2004  . Smokeless tobacco: Never Used  . Alcohol use No  . Drug use: No  . Sexual activity: Not on file     Comment: married   Other Topics Concern  . Not on file   Social History Narrative  . No narrative on file     PHYSICAL EXAMINATION  ECOG PERFORMANCE STATUS: 1 - Symptomatic but completely ambulatory  Vitals:   10/16/16 0944  BP: (!) 103/49  Pulse: 93  Resp: 16  Temp: 97.7 F (36.5 C)    GENERAL:alert, well nourished, well developed, comfortable, cooperative, obese, smiling and  accompanied by wife. SKIN: skin color, texture, turgor are normal, no rashes or significant lesions HEAD: Normocephalic, No masses, lesions, tenderness or abnormalities EYES: normal, EOMI, Conjunctiva are pink and non-injected EARS: External ears normal OROPHARYNX:lips, buccal mucosa, and tongue normal  NECK: supple, trachea midline LYMPH:  not examined BREAST:not examined LUNGS: clear to auscultation  HEART: regular rate & rhythm ABDOMEN:obese BACK: Back symmetric, no curvature. EXTREMITIES:less then 2 second capillary refill, no joint deformities, effusion, or inflammation, no skin discoloration, no cyanosis  NEURO: alert & oriented x 3 with fluent speech, no focal motor/sensory deficits, gait normal   LABORATORY DATA: CBC    Component Value Date/Time   WBC 8.2 10/16/2016 0856   RBC 3.83 (L) 10/16/2016 0856   HGB 11.4 (L) 10/16/2016 0856   HCT 34.3 (L) 10/16/2016 0856   PLT 212 10/16/2016 0856   MCV 89.6 10/16/2016 0856   MCH 29.8 10/16/2016 0856   MCHC 33.2 10/16/2016 0856   RDW 12.5 10/16/2016 0856   LYMPHSABS 1.7 10/16/2016 0856   MONOABS 0.8 10/16/2016 0856   EOSABS 0.9 (H) 10/16/2016 0856   BASOSABS 0.0 10/16/2016 0856      Chemistry      Component Value Date/Time   NA 136 10/16/2016 0856   K 3.4 (L) 10/16/2016 0856   CL 101 10/16/2016 0856   CO2 27 10/16/2016 0856   BUN 18 10/16/2016 0856   CREATININE 1.14 10/16/2016 0856   CREATININE 0.95 11/03/2015 1452      Component Value Date/Time   CALCIUM 8.9 10/16/2016 0856   ALKPHOS 71 10/16/2016 0856   AST 25 10/16/2016 0856   ALT 17 10/16/2016 0856   BILITOT 0.5 10/16/2016 0856      Lab Results  Component Value Date   TSH 1.346 10/02/2016     PENDING LABS:   RADIOGRAPHIC STUDIES:  No results found.   PATHOLOGY:    ASSESSMENT AND PLAN:  Adenocarcinoma of right lung, stage 3 (HCC) Stage IIIA ([id number]) adenocarcinoma of right lung, S/P R VATS, RML lobectomy, and mediastinal lymph  node  dissection by Dr. Roxan Hockey on 12/30/2015.  He is S/P 6 cycles of Carboplatin/Alimta (02/16/2016- 05/31/2016) and concomitant radiation in [place], [place].  Now on consolidative therapy with Imfinzi beginning on 09/04/2016.  Patient's case is interesting as pre-surgical imaging provided information concerning for a Stage I bronchogenic carcinoma.  PET imaging did not demonstrate any hypermetabolic lymph nodes.  At time of surgery, mediastinal lymph node staging was performed and Stage III disease was pathologically proven.  Therefore, defining complete resection has been difficult.  He is on consolidative Imfinzi and his case has been discussed with [employer].  We believe, based upon a multidisciplinary recommendations, the immunotherapy with Imfinzi is beneficial and the patient is advised of the financial risks associated with ongoing Imfinzi.  Case was discussed with Dr. Roxan Hockey, CTS, and he deems the patient incompletely resected given the lack of PET avidity and lack of complete lymph node dissection.  Pre-treatment labs: CBC diff, CMET.  I personally reviewed and went over laboratory results with the patient.  The results are noted within this dictation.  Labs satisfy treatment parameters.  Pre-treatment labs: CBC diff, CMET.  TSH every 4 weeks.  During infusion today, nursing will monitor patient for acute toxicities/side effects of immunotherapy in accordance to chemotherapy protocol.  He is due for restaging CT in June 2018.  Order is placed for CT of chest at the end of June 2018.  Return in 4 weeks for follow-up.   B12 deficiency B12 deficiency requiring IM replacement therapy.  Patient has opted to continue with IM replacement therapy instead of p.o./sublingual at this time.   Number of Diagnoses or Treatment Options- Section A:      Problems to Exam Physician Problem(s) Number x Points= Results  Self-limited or minor (stable, improved, or worsening)  Max=2  1   Est. Problem (to  examiner); stable, improved   1 2  Est. Problem (to examiner); worsening   2   New problem (to examiner); no additional work-up planned  Max=1 3   New problem (to examiner); add. work-up planned   4      Total: 2    Amount and/or Complexity of Data to be Reviewed- Section B    Data to be reviewed: Points    Review and/or order of clinical lab tests 1  [x]   Review and/or order of tests in the radiology section of CPT (includes nuclear med & other except cardiac cath & ECG) 1  []   Review and/or order of tests in the medicine section of CPT (e.g. EKG, cardiac cath, non-invasive vascular studies, pulmonary function studies) 1  []   Discussion of test results with performing physician 1  []   Decision to obtain old records and/or obtaining history from someone other than patient 1  []   Review and summarization of old records and/or obtaining history from someone other than patient and/or discussion of case with another health care provider 2  [x]   Independent visualization of image, tracing, or specimen itself (not simply review report) 2  []   Total:  3     Risk of  complications and/or Morbidity or Mortality- Section C  Level of Risk: Presenting Problem(s) Diagnostic Procedure(s) Ordered Management Options Selected  Minimal One self-limited or minor problem (eg cold, insect bite, tinea corporis)  Lab test requiring venipuncture  Chest xray  EKG/EEG  [place] or Echo  KOH prep  Urinalysis  Rest  Gargles  Elastic bandages  Superficial dressings  Low  Two or more self-limited or minor problems  One stable chronic illness (well-controlled HTN, non-insulin dependent diabetes, cataract, BPH)  Acute uncomplicated illness or injury (cystitis, allergic rhinitis, simple sprain)  Physiologic test not under stress (pulm fnx tests)  Non-cardiovascular imaging studies with contrast (barium enema)  Superficial needle biopsy  Clinical laboratory tests requiring arterial  puncture  Skin biopsies  OTC drugs  Minor surgery with no identified risk factors  Physical therapy  Occupational therapy  IV fluids without additives  Moderate  One or more chronic illnesses with mild exacerbation, progression, or side effects of treatment  Two or more stable chronic illnesses  Undiagnosed new problem with uncertain prognosis (lump in breast)  Acute illness with systemic symptoms (pyelonephritis, pneumonitis, colitis)  Acute complicated injury (head injury with brief loss of consciousness)  Physiologic test under stress (cardiac stress test, fetal contraction stress test)  Diagnostic endoscopies with no identified risk factors  Deep needle or incisional biopsy  Cardiovascular imaging studies with contrast and no identified risk factors (arteriogram, cardiac cath)  Obtain fluid from body cavity (LP, thoracentesis, culdocentesis)  Minor surgery with identified risk factors  Elective surgery (open, percutaneous, or endoscopic) with no identified risk factors  Prescription drug management  Therapeutic nuclear medicine  IV fluids with additives  Closed treatment of fracture of dislocation without manipulation  High  One or more chronic illnesses with severe exacerbation, progression, or side effects of treatment.  Acute or chronic illnesses or injuries that may pose a threat to life or bodily function (multiple trauma, acute MI, PE, severe respiratory distress, progressive severe rheumatoid arthritis, psychiatric illness with potential threat to self or others, peritonitis, acute renal failure)  An abrupt change in neurological status (seizure, TIA, weakness, sensory loss)  Cardiovascular imaging studies with contrast with identified risk factors  Cardiac electrophysiological tests  Diagnostic endoscopies with identified risk factors  Discography   Elective major surgery (open, percutaneous, or endoscopic) with identified risk factor  Emergency  major surgery (open, percutaneous, or endoscopic)  Parental controlled substances  Drug therapy requiring intensive monitoring for toxicity  Decision not to resuscitate or deescalate care because of poor prognosis     Final Result of Complexity      Choose decision making level with 2 or 3 checks OR choose the decision making level on Section B       A Number of diagnoses or treatment options  []  </= 1 Minimal  [x]  2 Limited  []  3 Multiple  []  >/= 4 Extensive  B Amount and complexity of data  []  </= 1 Minimal or low  []  2 Limited  [x]  3  Moderate  []  >/= 4 Extensive  C Highest risk  []  Minimal  []  Low  []  Moderate  [x]  High   Type of decision making  []  Straight-forward  []  Low Complexity  [x]  Moderate- Complexity  []  High- Complexity     ORDERS PLACED FOR THIS ENCOUNTER: No orders of the defined types were placed in this encounter.   MEDICATIONS PRESCRIBED THIS ENCOUNTER: No orders of the defined types were placed in this encounter.   THERAPY PLAN:  52 weeks worth of Imfinzi in the consolidative setting.  NCCN guidelines for Non-Small Cell Lung Cancer Surveillance in the setting of clinical/radiographic remission are as follows (5.2017):  A. Stage I-II (primary treatment included surgery +/- chemotherapy):   1. H+P and chest CT +/- contrast   every 6 months for 2-3 years, then H+P and low-dose non-contrast-enhanced chest CT annually  B. Stage I-II (primary treatment included RT) or Stage III or Stage IV (oligometastatic with all sites treated with definitive intent)   1. H+P and chest CT +/- contrast every 3-6 months for 3 years, then H+P and chest CT +/- contrast every 6 months for 2 years, then H+P and low-dose non-contrast-enhanced chest CT annually    A. Residual or new radiographic abnormalities may require more frequent imaging  C. Smoking cessation advice, counseling, and pharmacotherapy  D. PET/CT or Brain MRI is not routinely  indicated.   All questions were answered. The patient knows to call the clinic with any problems, questions or concerns. We can certainly see the patient much sooner if necessary.  Patient and plan discussed with Dr. Louise Zhou and she is in agreement with the aforementioned.   This note is electronically signed by: KEFALAS,THOMAS, PA-C 10/16/2016 5:00 PM  

## 2016-10-16 NOTE — Progress Notes (Signed)
[name] tolerated Imfinzi infusion and Vit B12 injection well without complaints or incident.Labs reviewed with Gary Crigler PA-C prior to administering these medications VSS upon discharge. Pt discharged self ambulatory using cane in satisfactory condition accompanied by his wife

## 2016-10-16 NOTE — Assessment & Plan Note (Signed)
B12 deficiency requiring IM replacement therapy.  Patient has opted to continue with IM replacement therapy instead of p.o./sublingual at this time.

## 2016-10-17 ENCOUNTER — Other Ambulatory Visit (HOSPITAL_COMMUNITY): Payer: Self-pay | Admitting: Pharmacist

## 2016-10-30 ENCOUNTER — Encounter (HOSPITAL_COMMUNITY): Payer: Self-pay

## 2016-10-30 ENCOUNTER — Encounter (HOSPITAL_BASED_OUTPATIENT_CLINIC_OR_DEPARTMENT_OTHER): Payer: Medicare Other

## 2016-10-30 VITALS — BP 122/55 | HR 57 | Temp 98.0°F | Resp 16 | Wt 204.0 lb

## 2016-10-30 DIAGNOSIS — Z5112 Encounter for antineoplastic immunotherapy: Secondary | ICD-10-CM

## 2016-10-30 DIAGNOSIS — R918 Other nonspecific abnormal finding of lung field: Secondary | ICD-10-CM | POA: Diagnosis not present

## 2016-10-30 DIAGNOSIS — C342 Malignant neoplasm of middle lobe, bronchus or lung: Secondary | ICD-10-CM

## 2016-10-30 DIAGNOSIS — C3491 Malignant neoplasm of unspecified part of right bronchus or lung: Secondary | ICD-10-CM

## 2016-10-30 LAB — CBC WITH DIFFERENTIAL/PLATELET
Basophils Absolute: 0 10*3/uL (ref 0.0–0.1)
Basophils Relative: 0 %
Eosinophils Absolute: 0.7 10*3/uL (ref 0.0–0.7)
Eosinophils Relative: 8 %
HCT: 32.8 % — ABNORMAL LOW (ref 39.0–52.0)
Hemoglobin: 11.2 g/dL — ABNORMAL LOW (ref 13.0–17.0)
Lymphocytes Relative: 20 %
Lymphs Abs: 1.6 10*3/uL (ref 0.7–4.0)
MCH: 29.8 pg (ref 26.0–34.0)
MCHC: 34.1 g/dL (ref 30.0–36.0)
MCV: 87.2 fL (ref 78.0–100.0)
Monocytes Absolute: 0.7 10*3/uL (ref 0.1–1.0)
Monocytes Relative: 9 %
Neutro Abs: 5.1 10*3/uL (ref 1.7–7.7)
Neutrophils Relative %: 63 %
Platelets: 223 10*3/uL (ref 150–400)
RBC: 3.76 MIL/uL — ABNORMAL LOW (ref 4.22–5.81)
RDW: 12.5 % (ref 11.5–15.5)
WBC: 8.2 10*3/uL (ref 4.0–10.5)

## 2016-10-30 LAB — COMPREHENSIVE METABOLIC PANEL
ALT: 14 U/L — ABNORMAL LOW (ref 17–63)
AST: 22 U/L (ref 15–41)
Albumin: 3.2 g/dL — ABNORMAL LOW (ref 3.5–5.0)
Alkaline Phosphatase: 61 U/L (ref 38–126)
Anion gap: 8 (ref 5–15)
BUN: 15 mg/dL (ref 6–20)
CO2: 26 mmol/L (ref 22–32)
Calcium: 8.6 mg/dL — ABNORMAL LOW (ref 8.9–10.3)
Chloride: 101 mmol/L (ref 101–111)
Creatinine, Ser: 1.13 mg/dL (ref 0.61–1.24)
GFR calc Af Amer: >60 mL/min (ref >60)
GFR calc non Af Amer: 58 mL/min — ABNORMAL LOW (ref >60)
Glucose, Bld: 147 mg/dL — ABNORMAL HIGH (ref 65–99)
Potassium: 3.6 mmol/L (ref 3.5–5.1)
Sodium: 135 mmol/L (ref 135–145)
Total Bilirubin: 0.6 mg/dL (ref 0.3–1.2)
Total Protein: 5.9 g/dL — ABNORMAL LOW (ref 6.5–8.1)

## 2016-10-30 LAB — TSH: TSH: 0.853 u[IU]/mL (ref 0.350–4.500)

## 2016-10-30 MED ORDER — HEPARIN SOD (PORK) LOCK FLUSH 100 UNIT/ML IV SOLN
500.0000 [IU] | Freq: Once | INTRAVENOUS | Status: AC | PRN
  Administered 2016-10-30: 500 [IU]
  Filled 2016-10-30: qty 5

## 2016-10-30 MED ORDER — SODIUM CHLORIDE 0.9 % IV SOLN: Freq: Once | INTRAVENOUS | Status: AC

## 2016-10-30 MED ORDER — DURVALUMAB 500 MG/10ML IV SOLN
10.0000 mg/kg | Freq: Once | INTRAVENOUS | Status: AC
  Administered 2016-10-30: 920 mg via INTRAVENOUS
  Filled 2016-10-30: qty 18.4

## 2016-10-30 MED ORDER — SODIUM CHLORIDE 0.9 % IV SOLN
INTRAVENOUS | Status: DC
  Administered 2016-10-30: 12:00:00 via INTRAVENOUS

## 2016-10-30 NOTE — Patient Instructions (Signed)
[facility] Discharge Instructions for Patients Receiving Chemotherapy   Beginning January 23rd 2017 lab work for the [facility] will be done in the  Main lab at [facility] on 1st floor. If you have a lab appointment with the [facility] please come in thru the  Main Entrance and check in at the main information desk  One liter of fluids given Today you received the following chemotherapy agents   To help prevent nausea and vomiting after your treatment, we encourage you to take your nausea medication     If you develop nausea and vomiting, or diarrhea that is not controlled by your medication, call the clinic.  The clinic phone number is [phone]. Office hours are Monday-Friday 8:30am-5:00pm.  BELOW ARE SYMPTOMS THAT SHOULD BE REPORTED IMMEDIATELY:  *FEVER GREATER THAN 101.0 F  *CHILLS WITH OR WITHOUT FEVER  NAUSEA AND VOMITING THAT IS NOT CONTROLLED WITH YOUR NAUSEA MEDICATION  *UNUSUAL SHORTNESS OF BREATH  *UNUSUAL BRUISING OR BLEEDING  TENDERNESS IN MOUTH AND THROAT WITH OR WITHOUT PRESENCE OF ULCERS  *URINARY PROBLEMS  *BOWEL PROBLEMS  UNUSUAL RASH Items with * indicate a potential emergency and should be followed up as soon as possible. If you have an emergency after office hours please contact your primary care physician or go to the nearest emergency department.  Please call the clinic during office hours if you have any questions or concerns.   You may also contact the Patient Navigator at [phone] should you have any questions or need assistance in obtaining follow up care.      Resources For Cancer Patients and their Caregivers ? American Cancer Society: Can assist with transportation, wigs, general needs, runs Look Good Feel Better.        (808)054-5764 ? Cancer Care: Provides financial assistance, online support groups, medication/co-pay assistance.  1-800-813-HOPE 651-248-6828) ? [place] Assists  [place] cancer patients and their families through emotional , educational and financial support.  901-069-4526 ? [place] DSS Where to apply for food stamps, Medicaid and utility assistance. 709-120-8026 ? [facility]: Transportation to medical appointments. 8621814682 ? Social Security Administration: May apply for disability if have a Stage IV cancer. 2496235113 463-210-9342 ? [employer], Disability and Transit Services: Assists with nutrition, care and transit needs. [phone]

## 2016-10-30 NOTE — Progress Notes (Signed)
Blood pressure was low this morning, patient complaints of being tired ,dizzy this morning. Reported to Mike Craze NP. Will start fluids per orders. See flowsheet for vitals.  Imfinzi given today per orders. Patient tolerated it well. See flowsheet for vitals . One liter of fluids given today per orders. Vitals stable and follow up as scheduled.

## 2016-11-13 ENCOUNTER — Encounter (HOSPITAL_BASED_OUTPATIENT_CLINIC_OR_DEPARTMENT_OTHER): Payer: Medicare Other | Admitting: Oncology

## 2016-11-13 ENCOUNTER — Encounter (HOSPITAL_COMMUNITY): Payer: Medicare Other | Attending: Hematology & Oncology

## 2016-11-13 ENCOUNTER — Encounter (HOSPITAL_COMMUNITY): Payer: Self-pay

## 2016-11-13 VITALS — BP 132/59 | HR 87 | Temp 97.5°F | Resp 18

## 2016-11-13 DIAGNOSIS — R918 Other nonspecific abnormal finding of lung field: Secondary | ICD-10-CM | POA: Insufficient documentation

## 2016-11-13 DIAGNOSIS — E538 Deficiency of other specified B group vitamins: Secondary | ICD-10-CM | POA: Insufficient documentation

## 2016-11-13 DIAGNOSIS — D649 Anemia, unspecified: Secondary | ICD-10-CM | POA: Diagnosis present

## 2016-11-13 DIAGNOSIS — C342 Malignant neoplasm of middle lobe, bronchus or lung: Secondary | ICD-10-CM

## 2016-11-13 DIAGNOSIS — Z5112 Encounter for antineoplastic immunotherapy: Secondary | ICD-10-CM | POA: Diagnosis present

## 2016-11-13 DIAGNOSIS — C3491 Malignant neoplasm of unspecified part of right bronchus or lung: Secondary | ICD-10-CM

## 2016-11-13 LAB — CBC WITH DIFFERENTIAL/PLATELET
Basophils Absolute: 0 10*3/uL (ref 0.0–0.1)
Basophils Relative: 0 %
Eosinophils Absolute: 0.6 10*3/uL (ref 0.0–0.7)
Eosinophils Relative: 8 %
HCT: 33.7 % — ABNORMAL LOW (ref 39.0–52.0)
Hemoglobin: 11.4 g/dL — ABNORMAL LOW (ref 13.0–17.0)
Lymphocytes Relative: 20 %
Lymphs Abs: 1.5 10*3/uL (ref 0.7–4.0)
MCH: 29.5 pg (ref 26.0–34.0)
MCHC: 33.8 g/dL (ref 30.0–36.0)
MCV: 87.1 fL (ref 78.0–100.0)
Monocytes Absolute: 0.8 10*3/uL (ref 0.1–1.0)
Monocytes Relative: 10 %
Neutro Abs: 4.6 10*3/uL (ref 1.7–7.7)
Neutrophils Relative %: 62 %
Platelets: 234 10*3/uL (ref 150–400)
RBC: 3.87 MIL/uL — ABNORMAL LOW (ref 4.22–5.81)
RDW: 12.9 % (ref 11.5–15.5)
WBC: 7.5 10*3/uL (ref 4.0–10.5)

## 2016-11-13 LAB — COMPREHENSIVE METABOLIC PANEL
ALT: 15 U/L — ABNORMAL LOW (ref 17–63)
AST: 24 U/L (ref 15–41)
Albumin: 3.3 g/dL — ABNORMAL LOW (ref 3.5–5.0)
Alkaline Phosphatase: 60 U/L (ref 38–126)
Anion gap: 8 (ref 5–15)
BUN: 12 mg/dL (ref 6–20)
CO2: 25 mmol/L (ref 22–32)
Calcium: 9 mg/dL (ref 8.9–10.3)
Chloride: 102 mmol/L (ref 101–111)
Creatinine, Ser: 1.05 mg/dL (ref 0.61–1.24)
GFR calc Af Amer: >60 mL/min (ref >60)
GFR calc non Af Amer: >60 mL/min (ref >60)
Glucose, Bld: 182 mg/dL — ABNORMAL HIGH (ref 65–99)
Potassium: 3.6 mmol/L (ref 3.5–5.1)
Sodium: 135 mmol/L (ref 135–145)
Total Bilirubin: 0.6 mg/dL (ref 0.3–1.2)
Total Protein: 6.1 g/dL — ABNORMAL LOW (ref 6.5–8.1)

## 2016-11-13 LAB — TSH: TSH: 3.445 u[IU]/mL (ref 0.350–4.500)

## 2016-11-13 MED ORDER — DURVALUMAB 500 MG/10ML IV SOLN
10.0000 mg/kg | Freq: Once | INTRAVENOUS | Status: AC
  Administered 2016-11-13: 920 mg via INTRAVENOUS
  Filled 2016-11-13: qty 18.4

## 2016-11-13 MED ORDER — SODIUM CHLORIDE 0.9 % IV SOLN
Freq: Once | INTRAVENOUS | Status: AC
  Administered 2016-11-13: 12:00:00 via INTRAVENOUS

## 2016-11-13 MED ORDER — CYANOCOBALAMIN 1000 MCG/ML IJ SOLN
1000.0000 ug | Freq: Once | INTRAMUSCULAR | Status: AC
  Administered 2016-11-13: 1000 ug via INTRAMUSCULAR
  Filled 2016-11-13: qty 1

## 2016-11-13 MED ORDER — HEPARIN SOD (PORK) LOCK FLUSH 100 UNIT/ML IV SOLN
500.0000 [IU] | Freq: Once | INTRAVENOUS | Status: AC | PRN
  Administered 2016-11-13: 500 [IU]
  Filled 2016-11-13: qty 5

## 2016-11-13 NOTE — Progress Notes (Signed)
Tolerated infusion w/o adverse reaction.  Alert, in no distress.  VSS.  Discharged ambulatory in c/o spouse.  

## 2016-11-13 NOTE — Progress Notes (Signed)
      Shah, Ashish, MD [address] [place] [address]  Adenocarcinoma of right lung, stage 3 (HCC) - Plan: TSH, CBC with Differential, Comprehensive metabolic panel, CT Chest W Contrast, CT Abdomen Pelvis W Contrast, DISCONTINUED: heparin lock flush 100 unit/mL, DISCONTINUED: 0.9 %  sodium chloride infusion, DISCONTINUED: durvalumab (IMFINZI) 920 mg in sodium chloride 0.9 % 100 mL chemo infusion  Progress Note  CURRENT THERAPY:  Consolidative therapy with Imfinzi beginning on 09/04/2016.  B12 monthly.  INTERVAL HISTORY: [name] 83 y.o. male returns for followup of Stage IIIA (T1AN2M0) adenocarcinoma of right lung, S/P R VATS, RML lobectomy, and mediastinal lymph node dissection by Dr. Hendrickson on 12/30/2015.  He is S/P 6 cycles of Carboplatin/Alimta 02/16/2016- 05/31/2016).  Now on consolidative therapy with Imfinzi beginning on 09/04/2016.    Adenocarcinoma of right lung, stage 3 (HCC)   11/10/2015 Imaging    13 mm spiculated lesion seen in right middle lobe.      11/30/2015 PET scan    Spiculated right middle lobe nodule is mildly hypermetabolic and most consistent with a stage IA adenocarcinoma.      12/30/2015 Surgery    Right middle lobectomy and node dissection      12/30/2015 Procedure    Right video-assisted thoracoscopy, Thoracoscopic right middle lobectomy, Mediastinal lymph node dissection, and On-Q local anesthetic catheter placement.      01/02/2016 Pathology Results    1. Lung, resection (segmental or lobe), Right Middle Lobe - INVASIVE ADENOCARCINOMA, WELL DIFFERENTIATED, SPANNING 1.2 CM. - THE SURGICAL RESECTION MARGINS ARE NEGATIVE FOR CARCINOMA. 2. Lymph node, biopsy, Level 12 - METASTATIC CARCINOMA IN 1 OF 1 LYMPH NODE (1/1). 3. Lymph node, biopsy, Level 12 #2 - METASTATIC CARCINOMA IN 1 OF 1 LYMPH NODE (1/1). 4. Lymph node, biopsy, Level 7 - METASTATIC CARCINOMA IN 1 OF 1 LYMPH NODE (1/1/). 5. Lymph node, biopsy, Level 7 #2 - METASTATIC CARCINOMA IN  1 OF 1 LYMPH NODE (1/1). 6. Lymph node, biopsy, Level 7 #3 - METASTATIC CARCINOMA IN 1 OF 1 LYMPH NODE (1/1). 7. Lymph node, biopsy, Level 7 #4 - METASTATIC CARCINOMA IN 1 OF 1 LYMPH NODE (1/1). 8. Lymph node, biopsy, 4R - THERE IS NO EVIDENCE OF CARCINOMA IN 1 OF 1 LYMPH NODE (0/1). 9. Lymph node, biopsy, 4R #2 - THERE IS NO EVIDENCE OF CARCINOMA IN 1 OF 1 LYMPH NODE (0/1).      01/05/2016 Pathology Results    PDL1 NEGATIVE- tumor proportion score of 0%.       01/05/2016 Pathology Results    Genomic alterations identified: BRAF V600E, KIT amplification, PDGFRA amplification, CDKN2A/B loss, TP53 S90fs*33.  Additional findings: MSI-STABLE.  No reportable alterations identified: EGFR, KRAS, ALK, MET, RET, ERBB2, ROS1      02/09/2016 Procedure    Port placed by IR.      02/16/2016 - 05/31/2016 Chemotherapy    Carboplatin/Pemetrexed x 6 cycles        - 08/01/2016 Radiation Therapy    [place],       08/28/2016 Imaging    CT CAP- No evidence of recurrent or metastatic carcinoma within the chest, abdomen, or pelvis.  Stable incidental findings include a absent, small benign right adrenal adenoma, sigmoid diverticulosis, and aortic atherosclerosis.      09/04/2016 -  Chemotherapy    Imfinzi immunotherapy up to 52 weeks.        [name] presents today for continuing follow up. He is scheduled to receive cycle 6   Durvalumab q14d today.   He states he has been tolerating Imfinzi well.  He has chronic vertigo and had an episode where he fell a few weeks ago. He currently takes meclizine TID. He states he feels dizzy if he turns rapidly. Otherwise he is doing well. Denies any SOB, diarrhea, abdominal pain, focal weakness.     Review of Systems  Constitutional: Negative.  Negative for malaise/fatigue.       Denies loss of appetite.  HENT: Negative.   Eyes: Negative.   Respiratory: Negative.  Negative for shortness of breath and wheezing.   Cardiovascular: Negative for chest  pain.  Gastrointestinal: Negative for abdominal pain, constipation and diarrhea.  Genitourinary: Negative.   Musculoskeletal: Positive for back pain (chronic).  Skin: Negative.   Neurological: Positive for dizziness. Negative for headaches.  Endo/Heme/Allergies: Negative.   Psychiatric/Behavioral: Negative.  Negative for depression. The patient does not have insomnia.     Past Medical History:  Diagnosis Date  . Adenocarcinoma of lung, stage 3 (HCC)    Stage IIIA  . Adenocarcinoma of right lung, stage 3 ([place]) 12/02/2015  . Arthritis   . Atrial fibrillation, transient ([place])    transient postop, < 24 hours  . B12 deficiency   . Cyst of scrotum   . Diverticulitis   . GERD (gastroesophageal reflux disease)   . Hernia   . History of pneumonia   . Hyperlipidemia   . Lung nodule    Spiculated right middle lobe nodule, hypermetabolic  . Type 2 diabetes mellitus ([place])     Past Surgical History:  Procedure Laterality Date  . BACK SURGERY    . CHOLECYSTECTOMY    . COLONOSCOPY N/A 11/24/2015   Procedure: COLONOSCOPY;  Surgeon: Rogene Houston, MD;  [facility];  Service: Endoscopy;  Laterality: N/A;  730  . COLONOSCOPY W/ POLYPECTOMY     x 5  . CYST EXCISION     Scrotum  . EYE SURGERY Bilateral    Cataract with Lens  . HERNIA REPAIR Right    Inguinal  . IR GENERIC HISTORICAL  02/09/2016   IR US GUIDE VASC ACCESS RIGHT 02/09/2016 Arne Cleveland, MD [facility]  . IR GENERIC HISTORICAL  02/09/2016   IR FLUORO GUIDE CV LINE RIGHT 02/09/2016 Arne Cleveland, MD [facility]  . LUMBAR DISC SURGERY     per patient report  . VIDEO ASSISTED THORACOSCOPY (VATS)/ LOBECTOMY Right 12/30/2015   Procedure: VIDEO ASSISTED THORACOSCOPY (VATS)/RIGHT MIDDLE LOBECTOMY;  Surgeon: Melrose Nakayama, MD;  Location: [facility];  Service: Thoracic;  Laterality: Right;    Family History  Problem Relation Age of Onset  . Colon cancer Brother     Social History   Social History  . Marital  status: Married    Spouse name: N/A  . Number of children: N/A  . Years of education: N/A   Social History Main Topics  . Smoking status: Former Smoker    Types: Cigarettes    Quit date: 05/27/2004  . Smokeless tobacco: Never Used  . Alcohol use No  . Drug use: No  . Sexual activity: Not Asked     Comment: married   Other Topics Concern  . None   Social History Narrative  . None     PHYSICAL EXAMINATION  ECOG PERFORMANCE STATUS: 1 - Symptomatic but completely ambulatory  Vitals:   11/13/16 1013  BP: (!) 132/48  Pulse: 88  Temp: 97.9 F (36.6 C)   Filed Weights   11/13/16  1013  Weight: 205 lb 6.4 oz (93.2 kg)     Physical Exam  Constitutional: He is oriented to person, place, and time and well-developed, well-nourished, and in no distress.  HENT:  Head: Normocephalic and atraumatic.  Eyes: Conjunctivae and EOM are normal. Pupils are equal, round, and reactive to light.  Neck: Normal range of motion. Neck supple.  Cardiovascular: Normal rate, regular rhythm and normal heart sounds.   Pulmonary/Chest: Effort normal and breath sounds normal.  Abdominal: Soft. Bowel sounds are normal.  Musculoskeletal: Normal range of motion.  Neurological: He is alert and oriented to person, place, and time. Gait normal.  Skin: Skin is warm and dry.  Nursing note and vitals reviewed.    LABORATORY DATA: CBC    Component Value Date/Time   WBC 7.5 11/13/2016 1015   RBC 3.87 (L) 11/13/2016 1015   HGB 11.4 (L) 11/13/2016 1015   HCT 33.7 (L) 11/13/2016 1015   PLT 234 11/13/2016 1015   MCV 87.1 11/13/2016 1015   MCH 29.5 11/13/2016 1015   MCHC 33.8 11/13/2016 1015   RDW 12.9 11/13/2016 1015   LYMPHSABS 1.5 11/13/2016 1015   MONOABS 0.8 11/13/2016 1015   EOSABS 0.6 11/13/2016 1015   BASOSABS 0.0 11/13/2016 1015      Chemistry      Component Value Date/Time   NA 135 11/13/2016 1015   K 3.6 11/13/2016 1015   CL 102 11/13/2016 1015   CO2 25 11/13/2016 1015   BUN 12  11/13/2016 1015   CREATININE 1.05 11/13/2016 1015   CREATININE 0.95 11/03/2015 1452      Component Value Date/Time   CALCIUM 9.0 11/13/2016 1015   ALKPHOS 60 11/13/2016 1015   AST 24 11/13/2016 1015   ALT 15 (L) 11/13/2016 1015   BILITOT 0.6 11/13/2016 1015     Lab Results  Component Value Date   TSH 3.445 11/13/2016     PENDING LABS:   RADIOGRAPHIC STUDIES: I have personally reviewed the radiological images as listed and agreed with the findings in the report. No results found.  CT CHEST, ABDOMEN, AND PELVIS WITH CONTRAST 08/28/2016  IMPRESSION: Postop changes in right hemithorax. No evidence of recurrent or metastatic carcinoma within the chest, abdomen, or pelvis.  Stable incidental findings include a absent, small benign right adrenal adenoma, sigmoid diverticulosis, and aortic atherosclerosis  PATHOLOGY:    ASSESSMENT AND PLAN:  Stage IIIA (T1AN2M0) adenocarcinoma of right lung, S/P R VATS, RML lobectomy, and mediastinal lymph node dissection by Dr. Hendrickson on 12/30/2015.  On adjuvant systemic chemotherapy consisting of Carboplatin/Alimta beginning on 02/16/2016- 05/31/2016.  S/P XRT finishing on 08/01/2016.  Now on consolidative therapy with Imfinzi beginning on 09/04/2016.  PLAN: Patient tolerating treatment very well. Proceed with cycle 6 of imfinzi today.  Continue with Imfinzi every 2 weeks for up to 52 weeks. He is not having any side effects associated with immunotherapy.  Repeat restaging CT C/A/P at the end of this month.  RTC in 4 weeks for follow up to review his CT scan results.   THERAPY PLAN:  Continue Imfinzi every 2 weeks for up to 52 weeks while being on the look out for side effects associated with immunotherapy.    All questions were answered. The patient knows to call the clinic with any problems, questions or concerns. We can certainly see the patient much sooner if necessary.    This note is electronically signed by:  ,  MD 11/13/2016 1:53 PM   

## 2016-11-13 NOTE — Patient Instructions (Signed)
[facility] at [facility] Discharge Instructions  RECOMMENDATIONS MADE BY THE CONSULTANT AND ANY TEST RESULTS WILL BE SENT TO YOUR REFERRING PHYSICIAN.  You saw Dr. Zhou today.  Thank you for choosing [facility] at [facility] to provide your oncology and hematology care.  To afford each patient quality time with our provider, please arrive at least 15 minutes before your scheduled appointment time.    If you have a lab appointment with the Cancer Center please come in thru the  Main Entrance and check in at the main information desk  You need to re-schedule your appointment should you arrive 10 or more minutes late.  We strive to give you quality time with our providers, and arriving late affects you and other patients whose appointments are after yours.  Also, if you no show three or more times for appointments you may be dismissed from the clinic at the providers discretion.     Again, thank you for choosing [facility].  Our hope is that these requests will decrease the amount of time that you wait before being seen by our physicians.       _____________________________________________________________  Should you have questions after your visit to [facility], please contact our office at [phone] between the hours of 8:30 a.m. and 4:30 p.m.  Voicemails left after 4:30 p.m. will not be returned until the following business day.  For prescription refill requests, have your pharmacy contact our office.       Resources For Cancer Patients and their Caregivers ? American Cancer Society: Can assist with transportation, wigs, general needs, runs Look Good Feel Better.        1-888-227-6333 ? Cancer Care: Provides financial assistance, online support groups, medication/co-pay assistance.  1-800-813-HOPE (4673) ? [facility] Assists [place] cancer patients and their families through  emotional , educational and financial support.  336-427-4357 ? [place] DSS Where to apply for food stamps, Medicaid and utility assistance. 336-342-1394 ? RCATS: Transportation to medical appointments. 336-347-2287 ? Social Security Administration: May apply for disability if have a Stage IV cancer. 336-342-7796 1-800-772-1213 ? [place] Aging, Disability and Transit Services: Assists with nutrition, care and transit needs. 336-349-2343  Cancer Center Support Programs: @10RELATIVEDAYS@ > Cancer Support Group  2nd Tuesday of the month 1pm-2pm, Journey Room  > Creative Journey  3rd Tuesday of the month 1130am-1pm, Journey Room  > Look Good Feel Better  1st Wednesday of the month 10am-12 noon, Journey Room (Call [employer] to register 1-800-395-5775)    

## 2016-11-27 ENCOUNTER — Encounter (HOSPITAL_BASED_OUTPATIENT_CLINIC_OR_DEPARTMENT_OTHER): Payer: Medicare Other

## 2016-11-27 ENCOUNTER — Encounter (HOSPITAL_COMMUNITY): Payer: Self-pay

## 2016-11-27 VITALS — BP 131/58 | HR 76 | Temp 97.7°F | Resp 18 | Wt 204.0 lb

## 2016-11-27 DIAGNOSIS — Z5112 Encounter for antineoplastic immunotherapy: Secondary | ICD-10-CM

## 2016-11-27 DIAGNOSIS — R918 Other nonspecific abnormal finding of lung field: Secondary | ICD-10-CM | POA: Diagnosis not present

## 2016-11-27 DIAGNOSIS — C342 Malignant neoplasm of middle lobe, bronchus or lung: Secondary | ICD-10-CM | POA: Diagnosis not present

## 2016-11-27 DIAGNOSIS — C3491 Malignant neoplasm of unspecified part of right bronchus or lung: Secondary | ICD-10-CM

## 2016-11-27 LAB — CBC WITH DIFFERENTIAL/PLATELET
Basophils Absolute: 0 10*3/uL (ref 0.0–0.1)
Basophils Relative: 0 %
Eosinophils Absolute: 0.7 10*3/uL (ref 0.0–0.7)
Eosinophils Relative: 8 %
HCT: 33.8 % — ABNORMAL LOW (ref 39.0–52.0)
Hemoglobin: 11.4 g/dL — ABNORMAL LOW (ref 13.0–17.0)
Lymphocytes Relative: 17 %
Lymphs Abs: 1.4 10*3/uL (ref 0.7–4.0)
MCH: 29.2 pg (ref 26.0–34.0)
MCHC: 33.7 g/dL (ref 30.0–36.0)
MCV: 86.7 fL (ref 78.0–100.0)
Monocytes Absolute: 0.9 10*3/uL (ref 0.1–1.0)
Monocytes Relative: 11 %
Neutro Abs: 5.3 10*3/uL (ref 1.7–7.7)
Neutrophils Relative %: 64 %
Platelets: 227 10*3/uL (ref 150–400)
RBC: 3.9 MIL/uL — ABNORMAL LOW (ref 4.22–5.81)
RDW: 13.1 % (ref 11.5–15.5)
WBC: 8.3 10*3/uL (ref 4.0–10.5)

## 2016-11-27 LAB — COMPREHENSIVE METABOLIC PANEL
ALT: 16 U/L — ABNORMAL LOW (ref 17–63)
AST: 23 U/L (ref 15–41)
Albumin: 3.5 g/dL (ref 3.5–5.0)
Alkaline Phosphatase: 60 U/L (ref 38–126)
Anion gap: 8 (ref 5–15)
BUN: 12 mg/dL (ref 6–20)
CO2: 25 mmol/L (ref 22–32)
Calcium: 8.6 mg/dL — ABNORMAL LOW (ref 8.9–10.3)
Chloride: 100 mmol/L — ABNORMAL LOW (ref 101–111)
Creatinine, Ser: 1.04 mg/dL (ref 0.61–1.24)
GFR calc Af Amer: >60 mL/min (ref >60)
GFR calc non Af Amer: >60 mL/min (ref >60)
Glucose, Bld: 107 mg/dL — ABNORMAL HIGH (ref 65–99)
Potassium: 3.7 mmol/L (ref 3.5–5.1)
Sodium: 133 mmol/L — ABNORMAL LOW (ref 135–145)
Total Bilirubin: 0.5 mg/dL (ref 0.3–1.2)
Total Protein: 6.3 g/dL — ABNORMAL LOW (ref 6.5–8.1)

## 2016-11-27 MED ORDER — HEPARIN SOD (PORK) LOCK FLUSH 100 UNIT/ML IV SOLN
500.0000 [IU] | Freq: Once | INTRAVENOUS | Status: AC | PRN
  Administered 2016-11-27: 500 [IU]
  Filled 2016-11-27: qty 5

## 2016-11-27 MED ORDER — SODIUM CHLORIDE 0.9% FLUSH
10.0000 mL | INTRAVENOUS | Status: DC | PRN
  Administered 2016-11-27: 10 mL
  Filled 2016-11-27: qty 10

## 2016-11-27 MED ORDER — SODIUM CHLORIDE 0.9 % IV SOLN
10.0000 mg/kg | Freq: Once | INTRAVENOUS | Status: AC
Start: 2016-11-27 — End: 2016-11-27
  Administered 2016-11-27: 920 mg via INTRAVENOUS
  Filled 2016-11-27: qty 18.4

## 2016-11-27 MED ORDER — SODIUM CHLORIDE 0.9 % IV SOLN
Freq: Once | INTRAVENOUS | Status: AC
  Administered 2016-11-27: 13:00:00 via INTRAVENOUS

## 2016-11-27 NOTE — Progress Notes (Signed)
[name] tolerated Imfinzi infusion well without complaints or incident. VSS throughout infusion and upon discharge. Pt discharged self ambulatory using walker in satisfactory condition accompanied by his wife

## 2016-11-27 NOTE — Patient Instructions (Addendum)
[facility] at [facility] Discharge Instructions  RECOMMENDATIONS MADE BY THE CONSULTANT AND ANY TEST RESULTS WILL BE SENT TO YOUR REFERRING PHYSICIAN.  Today you received Imfinzi. Your labs are good. You will get Imfinzi & B12 in 2 weeks.    Thank you for choosing [facility] at [facility] to provide your oncology and hematology care.  To afford each patient quality time with our provider, please arrive at least 15 minutes before your scheduled appointment time.    If you have a lab appointment with the [facility] please come in thru the  Main Entrance and check in at the main information desk  You need to re-schedule your appointment should you arrive 10 or more minutes late.  We strive to give you quality time with our providers, and arriving late affects you and other patients whose appointments are after yours.  Also, if you no show three or more times for appointments you may be dismissed from the clinic at the providers discretion.     Again, thank you for choosing [facility].  Our hope is that these requests will decrease the amount of time that you wait before being seen by our physicians.       _____________________________________________________________  Should you have questions after your visit to [facility], please contact our office at [phone] between the hours of 8:30 a.m. and 4:30 p.m.  Voicemails left after 4:30 p.m. will not be returned until the following business day.  For prescription refill requests, have your pharmacy contact our office.       Resources For Cancer Patients and their Caregivers ? American Cancer Society: Can assist with transportation, wigs, general needs, runs Look Good Feel Better.        774 821 1403 ? Cancer Care: Provides financial assistance, online support groups, medication/co-pay assistance.  1-800-813-HOPE 509-311-4085) ? [facility] Assists [place] cancer patients and their families through emotional , educational and financial support.  213-027-6105 ? [place] DSS Where to apply for food stamps, Medicaid and utility assistance. 614-108-3324 ? RCATS: Transportation to medical appointments. 3650273697 ? Social Security Administration: May apply for disability if have a Stage IV cancer. (705)377-9749 2893038627 ? [employer], Disability and Transit Services: Assists with nutrition, care and transit needs. [place] Support Programs: @10RELATIVEDAYS @ > Cancer Support Group  2nd Tuesday of the month 1pm-2pm, Journey Room  > Creative Journey  3rd Tuesday of the month 1130am-1pm, Journey Room  > Look Good Feel Better  1st Wednesday of the month 10am-12 noon, Journey Room (Call [facility] to register [phone])

## 2016-12-07 ENCOUNTER — Encounter (HOSPITAL_COMMUNITY): Payer: Self-pay

## 2016-12-07 ENCOUNTER — Ambulatory Visit (HOSPITAL_COMMUNITY)
Admission: RE | Admit: 2016-12-07 | Discharge: 2016-12-07 | Disposition: A | Discharge status: Home / Self Care | Payer: Medicare Other | Source: Ambulatory Visit | Attending: Oncology | Admitting: Oncology

## 2016-12-07 DIAGNOSIS — J90 Pleural effusion, not elsewhere classified: Secondary | ICD-10-CM | POA: Diagnosis not present

## 2016-12-07 DIAGNOSIS — J439 Emphysema, unspecified: Secondary | ICD-10-CM | POA: Insufficient documentation

## 2016-12-07 DIAGNOSIS — I700 Atherosclerosis of aorta: Secondary | ICD-10-CM | POA: Diagnosis not present

## 2016-12-07 DIAGNOSIS — C3491 Malignant neoplasm of unspecified part of right bronchus or lung: Secondary | ICD-10-CM | POA: Insufficient documentation

## 2016-12-07 DIAGNOSIS — R918 Other nonspecific abnormal finding of lung field: Secondary | ICD-10-CM | POA: Diagnosis not present

## 2016-12-07 MED ORDER — IOPAMIDOL (ISOVUE-300) INJECTION 61%
100.0000 mL | Freq: Once | INTRAVENOUS | Status: AC | PRN
  Administered 2016-12-07: 100 mL via INTRAVENOUS

## 2016-12-11 ENCOUNTER — Encounter (HOSPITAL_COMMUNITY): Payer: Self-pay | Admitting: Oncology

## 2016-12-11 ENCOUNTER — Encounter (HOSPITAL_COMMUNITY): Payer: Medicare Other | Attending: Hematology & Oncology | Admitting: Oncology

## 2016-12-11 ENCOUNTER — Encounter (HOSPITAL_BASED_OUTPATIENT_CLINIC_OR_DEPARTMENT_OTHER): Payer: Medicare Other

## 2016-12-11 VITALS — BP 126/52 | HR 82 | Temp 97.5°F | Resp 18

## 2016-12-11 VITALS — BP 124/47 | HR 99 | Resp 18 | Ht 70.0 in | Wt 202.5 lb

## 2016-12-11 DIAGNOSIS — E538 Deficiency of other specified B group vitamins: Secondary | ICD-10-CM | POA: Diagnosis present

## 2016-12-11 DIAGNOSIS — C3491 Malignant neoplasm of unspecified part of right bronchus or lung: Secondary | ICD-10-CM

## 2016-12-11 DIAGNOSIS — Z5112 Encounter for antineoplastic immunotherapy: Secondary | ICD-10-CM

## 2016-12-11 DIAGNOSIS — D649 Anemia, unspecified: Secondary | ICD-10-CM | POA: Diagnosis present

## 2016-12-11 DIAGNOSIS — R918 Other nonspecific abnormal finding of lung field: Secondary | ICD-10-CM | POA: Insufficient documentation

## 2016-12-11 DIAGNOSIS — C342 Malignant neoplasm of middle lobe, bronchus or lung: Secondary | ICD-10-CM

## 2016-12-11 LAB — CBC WITH DIFFERENTIAL/PLATELET
Basophils Absolute: 0 10*3/uL (ref 0.0–0.1)
Basophils Relative: 0 %
Eosinophils Absolute: 0.7 10*3/uL (ref 0.0–0.7)
Eosinophils Relative: 7 %
HCT: 35.8 % — ABNORMAL LOW (ref 39.0–52.0)
Hemoglobin: 12.2 g/dL — ABNORMAL LOW (ref 13.0–17.0)
Lymphocytes Relative: 15 %
Lymphs Abs: 1.4 10*3/uL (ref 0.7–4.0)
MCH: 29.1 pg (ref 26.0–34.0)
MCHC: 34.1 g/dL (ref 30.0–36.0)
MCV: 85.4 fL (ref 78.0–100.0)
Monocytes Absolute: 0.9 10*3/uL (ref 0.1–1.0)
Monocytes Relative: 10 %
Neutro Abs: 6 10*3/uL (ref 1.7–7.7)
Neutrophils Relative %: 68 %
Platelets: 259 10*3/uL (ref 150–400)
RBC: 4.19 MIL/uL — ABNORMAL LOW (ref 4.22–5.81)
RDW: 13.1 % (ref 11.5–15.5)
WBC: 8.9 10*3/uL (ref 4.0–10.5)

## 2016-12-11 LAB — COMPREHENSIVE METABOLIC PANEL
ALT: 19 U/L (ref 17–63)
AST: 26 U/L (ref 15–41)
Albumin: 3.6 g/dL (ref 3.5–5.0)
Alkaline Phosphatase: 68 U/L (ref 38–126)
Anion gap: 8 (ref 5–15)
BUN: 13 mg/dL (ref 6–20)
CO2: 26 mmol/L (ref 22–32)
Calcium: 9 mg/dL (ref 8.9–10.3)
Chloride: 96 mmol/L — ABNORMAL LOW (ref 101–111)
Creatinine, Ser: 1.12 mg/dL (ref 0.61–1.24)
GFR calc Af Amer: >60 mL/min (ref >60)
GFR calc non Af Amer: 58 mL/min — ABNORMAL LOW (ref >60)
Glucose, Bld: 149 mg/dL — ABNORMAL HIGH (ref 65–99)
Potassium: 3.8 mmol/L (ref 3.5–5.1)
Sodium: 130 mmol/L — ABNORMAL LOW (ref 135–145)
Total Bilirubin: 0.7 mg/dL (ref 0.3–1.2)
Total Protein: 6.6 g/dL (ref 6.5–8.1)

## 2016-12-11 LAB — TSH: TSH: 3.784 u[IU]/mL (ref 0.350–4.500)

## 2016-12-11 MED ORDER — SODIUM CHLORIDE 0.9 % IV SOLN
Freq: Once | INTRAVENOUS | Status: AC
  Administered 2016-12-11: 11:00:00 via INTRAVENOUS

## 2016-12-11 MED ORDER — SODIUM CHLORIDE 0.9 % IV SOLN
10.0000 mg/kg | Freq: Once | INTRAVENOUS | Status: AC
  Administered 2016-12-11: 920 mg via INTRAVENOUS
  Filled 2016-12-11: qty 18.4

## 2016-12-11 MED ORDER — CYANOCOBALAMIN 1000 MCG/ML IJ SOLN
1000.0000 ug | Freq: Once | INTRAMUSCULAR | Status: AC
  Administered 2016-12-11: 1000 ug via INTRAMUSCULAR
  Filled 2016-12-11: qty 1

## 2016-12-11 MED ORDER — SODIUM CHLORIDE 0.9% FLUSH
10.0000 mL | INTRAVENOUS | Status: DC | PRN
  Administered 2016-12-11: 10 mL
  Filled 2016-12-11: qty 10

## 2016-12-11 MED ORDER — HEPARIN SOD (PORK) LOCK FLUSH 100 UNIT/ML IV SOLN
500.0000 [IU] | Freq: Once | INTRAVENOUS | Status: AC | PRN
  Administered 2016-12-11: 500 [IU]
  Filled 2016-12-11: qty 5

## 2016-12-11 NOTE — Progress Notes (Addendum)
[name], [place] [address]  No diagnosis found.  Progress Note  CURRENT THERAPY:  Consolidative therapy with Imfinzi beginning on 09/04/2016.  B12 monthly.  INTERVAL HISTORY: [name] 83 y.o. male returns for followup of Stage IIIA (T1AN2M0) adenocarcinoma of right lung, S/P R VATS, RML lobectomy, and mediastinal lymph node dissection by Dr. Roxan Hockey on 12/30/2015.  He is S/P 6 cycles of Carboplatin/Alimta 02/16/2016- 05/31/2016).  Now on consolidative therapy with Imfinzi beginning on 09/04/2016.    Adenocarcinoma of right lung, stage 3 (HCC)   11/10/2015 Imaging    13 mm spiculated lesion seen in right middle lobe.      11/30/2015 PET scan    Spiculated right middle lobe nodule is mildly hypermetabolic and most consistent with a stage IA adenocarcinoma.      12/30/2015 Surgery    Right middle lobectomy and node dissection      12/30/2015 Procedure    Right video-assisted thoracoscopy, Thoracoscopic right middle lobectomy, Mediastinal lymph node dissection, and On-Q local anesthetic catheter placement.      01/02/2016 Pathology Results    1. Lung, resection (segmental or lobe), Right Middle Lobe - INVASIVE ADENOCARCINOMA, WELL DIFFERENTIATED, SPANNING 1.2 CM. - THE SURGICAL RESECTION MARGINS ARE NEGATIVE FOR CARCINOMA. 2. Lymph node, biopsy, Level 12 - METASTATIC CARCINOMA IN 1 OF 1 LYMPH NODE (1/1). 3. Lymph node, biopsy, Level 12 #2 - METASTATIC CARCINOMA IN 1 OF 1 LYMPH NODE (1/1). 4. Lymph node, biopsy, Level 7 - METASTATIC CARCINOMA IN 1 OF 1 LYMPH NODE (1/1/). 5. Lymph node, biopsy, Level 7 #2 - METASTATIC CARCINOMA IN 1 OF 1 LYMPH NODE (1/1). 6. Lymph node, biopsy, Level 7 #3 - METASTATIC CARCINOMA IN 1 OF 1 LYMPH NODE (1/1). 7. Lymph node, biopsy, Level 7 #4 - METASTATIC CARCINOMA IN 1 OF 1 LYMPH NODE (1/1). 8. Lymph node, biopsy, 4R - THERE IS NO EVIDENCE OF CARCINOMA IN 1 OF 1 LYMPH NODE (0/1). 9. Lymph node, biopsy, 4R #2 -  THERE IS NO EVIDENCE OF CARCINOMA IN 1 OF 1 LYMPH NODE (0/1).      01/05/2016 Pathology Results    PDL1 NEGATIVE- tumor proportion score of 0%.       01/05/2016 Pathology Results    Genomic alterations identified: BRAF V600E, KIT amplification, PDGFRA amplification, CDKN2A/B loss, TP53 S69f*33.  Additional findings: MSI-STABLE.  No reportable alterations identified: EGFR, KRAS, ALK, MET, RET, ERBB2, ROS1      02/09/2016 Procedure    Port placed by IR.      02/16/2016 - 05/31/2016 Chemotherapy    Carboplatin/Pemetrexed x 6 cycles        - 08/01/2016 Radiation Therapy    [place], [place]      08/28/2016 Imaging    CT CAP- No evidence of recurrent or metastatic carcinoma within the chest, abdomen, or pelvis.  Stable incidental findings include a absent, small benign right adrenal adenoma, sigmoid diverticulosis, and aortic atherosclerosis.      09/04/2016 -  Chemotherapy    Imfinzi immunotherapy up to 52 weeks.       12/07/2016 Imaging    CT C/A/P: New focal streaky opacities within the peripheral posterior right lower lobe and anteromedial left upper lobe- likely atelectasis with infection or other process is less likely. Recommend attention to these areas on future scans.  Small right pleural effusion, slightly increased from 08/28/2016. No evidence of pleural mass or enhancement.  No evidence of malignancy/ metastatic disease within  the abdomen or pelvis.        [name] presents today for continuing follow up. He is scheduled to receive cycle 6 Durvalumab q14d today.   Patient presents today for follow up. He states he has been doing well. He is tolerating Imfinzi well without any side effects.  He has chronic vertigo and continues to have balance issues. However patient still remains active and walks with a cane. He denies any weight loss, appetite is great. Denies any headaches, SOB, diarrhea, abdominal pain, focal weakness.     Review of Systems    Constitutional: Negative.  Negative for malaise/fatigue.       Denies loss of appetite.  HENT: Negative.   Eyes: Negative.   Respiratory: Negative.  Negative for shortness of breath and wheezing.   Cardiovascular: Negative for chest pain.  Gastrointestinal: Negative for abdominal pain, constipation and diarrhea.  Genitourinary: Negative.   Musculoskeletal: Positive for back pain (chronic).  Skin: Negative.   Neurological: Negative for dizziness and headaches.  Endo/Heme/Allergies: Negative.   Psychiatric/Behavioral: Negative.  Negative for depression. The patient does not have insomnia.     Past Medical History:  Diagnosis Date  . Adenocarcinoma of lung, stage 3 (HCC)    Stage IIIA  . Adenocarcinoma of right lung, stage 3 ([facility]) 12/02/2015  . Arthritis   . Atrial fibrillation, transient ([facility])    transient postop, < 24 hours  . B12 deficiency   . Cyst of scrotum   . Diverticulitis   . GERD (gastroesophageal reflux disease)   . Hernia   . History of pneumonia   . Hyperlipidemia   . Lung nodule    Spiculated right middle lobe nodule, hypermetabolic  . Type 2 diabetes mellitus ([facility])     Past Surgical History:  Procedure Laterality Date  . BACK SURGERY    . CHOLECYSTECTOMY    . COLONOSCOPY N/A 11/24/2015   Procedure: COLONOSCOPY;  Surgeon: Rogene Houston, MD;  Location: [facility];  Service: Endoscopy;  Laterality: N/A;  730  . COLONOSCOPY W/ POLYPECTOMY     x 5  . CYST EXCISION     Scrotum  . EYE SURGERY Bilateral    Cataract with Lens  . HERNIA REPAIR Right    Inguinal  . IR GENERIC HISTORICAL  02/09/2016   IR US GUIDE VASC ACCESS RIGHT 02/09/2016 Arne Cleveland, MD [facility]  . IR GENERIC HISTORICAL  02/09/2016   IR FLUORO GUIDE CV LINE RIGHT 02/09/2016 Arne Cleveland, MD [facility]  . LUMBAR DISC SURGERY     per patient report  . VIDEO ASSISTED THORACOSCOPY (VATS)/ LOBECTOMY Right 12/30/2015   Procedure: VIDEO ASSISTED THORACOSCOPY (VATS)/RIGHT MIDDLE  LOBECTOMY;  Surgeon: Melrose Nakayama, MD;  Location: [facility];  Service: Thoracic;  Laterality: Right;    Family History  Problem Relation Age of Onset  . Colon cancer Brother     Social History   Social History  . Marital status: Married    Spouse name: N/A  . Number of children: N/A  . Years of education: N/A   Social History Main Topics  . Smoking status: Former Smoker    Types: Cigarettes    Quit date: 05/27/2004  . Smokeless tobacco: Never Used  . Alcohol use No  . Drug use: No  . Sexual activity: Not Asked     Comment: married   Other Topics Concern  . None   Social History Narrative  . None     PHYSICAL EXAMINATION  ECOG PERFORMANCE STATUS: 1 - Symptomatic but completely ambulatory  Vitals:   12/11/16 1034  BP: (!) 124/47  Pulse: 99  Resp: 18   Filed Weights   12/11/16 1034  Weight: 202 lb 8 oz (91.9 kg)     Physical Exam  Constitutional: He is oriented to person, place, and time and well-developed, well-nourished, and in no distress.  HENT:  Head: Normocephalic and atraumatic.  Eyes: Conjunctivae and EOM are normal. Pupils are equal, round, and reactive to light.  Neck: Normal range of motion. Neck supple.  Cardiovascular: Normal rate, regular rhythm and normal heart sounds.   Pulmonary/Chest: Effort normal and breath sounds normal.  Abdominal: Soft. Bowel sounds are normal.  Musculoskeletal: Normal range of motion.  Neurological: He is alert and oriented to person, place, and time. Gait normal.  Skin: Skin is warm and dry.  Nursing note and vitals reviewed.    LABORATORY DATA: CBC    Component Value Date/Time   WBC 8.9 12/11/2016 1029   RBC 4.19 (L) 12/11/2016 1029   HGB 12.2 (L) 12/11/2016 1029   HCT 35.8 (L) 12/11/2016 1029   PLT 259 12/11/2016 1029   MCV 85.4 12/11/2016 1029   MCH 29.1 12/11/2016 1029   MCHC 34.1 12/11/2016 1029   RDW 13.1 12/11/2016 1029   LYMPHSABS 1.4 12/11/2016 1029   MONOABS 0.9 12/11/2016 1029    EOSABS 0.7 12/11/2016 1029   BASOSABS 0.0 12/11/2016 1029      Chemistry      Component Value Date/Time   NA 130 (L) 12/11/2016 1029   K 3.8 12/11/2016 1029   CL 96 (L) 12/11/2016 1029   CO2 26 12/11/2016 1029   BUN 13 12/11/2016 1029   CREATININE 1.12 12/11/2016 1029   CREATININE 0.95 11/03/2015 1452      Component Value Date/Time   CALCIUM 9.0 12/11/2016 1029   ALKPHOS 68 12/11/2016 1029   AST 26 12/11/2016 1029   ALT 19 12/11/2016 1029   BILITOT 0.7 12/11/2016 1029     Lab Results  Component Value Date   TSH 3.784 12/11/2016     PENDING LABS:   RADIOGRAPHIC STUDIES: I have personally reviewed the radiological images as listed and agreed with the findings in the report. Ct Chest W Contrast  Result Date: 12/07/2016 CLINICAL DATA:  83 year old male for restaging of right lung adenocarcinoma. EXAM: CT CHEST, ABDOMEN, AND PELVIS WITH CONTRAST TECHNIQUE: Multidetector CT imaging of the chest, abdomen and pelvis was performed following the standard protocol during bolus administration of intravenous contrast. CONTRAST:  15m ISOVUE-300 IOPAMIDOL (ISOVUE-300) INJECTION 61% COMPARISON:  08/28/2016 and prior CTs FINDINGS: CT CHEST FINDINGS Cardiovascular: Coronary artery and thoracic aortic atherosclerotic calcifications noted without aortic aneurysm. Heart size is normal. No pericardial effusion. A right Port-A-Cath is present with tip in the lower SVC. Mediastinum/Nodes: No enlarged lymph nodes or masses identified. Lungs/Pleura: Changes from right middle lobectomy noted. Scarring within the anterior mid and lower right lung noted. A new 1 x 2.5 cm focal opacity within the peripheral posterior right lower lobe (images 88-91) and new 0.7 x 1.5 cm similar appearing opacity within the peripheral anteromedial left upper lobe (images 69-73) probably representing atelectasis. A small right pleural effusion has slightly increased. No pleural mass or enhancement noted. Moderate centrilobular  emphysema again noted. There is no evidence of pneumothorax. Musculoskeletal: No chest wall mass or suspicious bone lesions identified. CT ABDOMEN PELVIS FINDINGS Hepatobiliary: Tiny hypodense hepatic lesions are unchanged. No new or suspicious hepatic abnormalities are identified.  The patient is status post cholecystectomy. There is no evidence of biliary dilatation. Pancreas: Unremarkable Spleen: Unremarkable Adrenals/Urinary Tract: The kidneys, adrenal glands and bladder are unremarkable except for a stable 1.4 cm right adrenal adenoma and bilateral renal cysts. Stomach/Bowel: Stomach is within normal limits. Appendix appears normal. No evidence of bowel wall thickening, distention, or inflammatory changes. Colonic diverticulosis again identified. Vascular/Lymphatic: Aortic atherosclerosis. No enlarged abdominal or pelvic lymph nodes. Reproductive: Mild prostate enlargement again noted Other: No free fluid, focal collection or pneumoperitoneum. Musculoskeletal: No suspicious focal bony lesions or acute abnormality. IMPRESSION: New focal streaky opacities within the peripheral posterior right lower lobe and anteromedial left upper lobe- likely atelectasis with infection or other process is less likely. Recommend attention to these areas on future scans. Small right pleural effusion, slightly increased from 08/28/2016. No evidence of pleural mass or enhancement. No evidence of malignancy/ metastatic disease within the abdomen or pelvis. Aortic Atherosclerosis (ICD10-I70.0) and Emphysema (ICD10-J43.9). Electronically Signed   By: Margarette Canada M.D.   On: 12/07/2016 15:13   Ct Abdomen Pelvis W Contrast  Result Date: 12/07/2016 CLINICAL DATA:  83 year old male for restaging of right lung adenocarcinoma. EXAM: CT CHEST, ABDOMEN, AND PELVIS WITH CONTRAST TECHNIQUE: Multidetector CT imaging of the chest, abdomen and pelvis was performed following the standard protocol during bolus administration of intravenous  contrast. CONTRAST:  110m ISOVUE-300 IOPAMIDOL (ISOVUE-300) INJECTION 61% COMPARISON:  08/28/2016 and prior CTs FINDINGS: CT CHEST FINDINGS Cardiovascular: Coronary artery and thoracic aortic atherosclerotic calcifications noted without aortic aneurysm. Heart size is normal. No pericardial effusion. A right Port-A-Cath is present with tip in the lower SVC. Mediastinum/Nodes: No enlarged lymph nodes or masses identified. Lungs/Pleura: Changes from right middle lobectomy noted. Scarring within the anterior mid and lower right lung noted. A new 1 x 2.5 cm focal opacity within the peripheral posterior right lower lobe (images 88-91) and new 0.7 x 1.5 cm similar appearing opacity within the peripheral anteromedial left upper lobe (images 69-73) probably representing atelectasis. A small right pleural effusion has slightly increased. No pleural mass or enhancement noted. Moderate centrilobular emphysema again noted. There is no evidence of pneumothorax. Musculoskeletal: No chest wall mass or suspicious bone lesions identified. CT ABDOMEN PELVIS FINDINGS Hepatobiliary: Tiny hypodense hepatic lesions are unchanged. No new or suspicious hepatic abnormalities are identified. The patient is status post cholecystectomy. There is no evidence of biliary dilatation. Pancreas: Unremarkable Spleen: Unremarkable Adrenals/Urinary Tract: The kidneys, adrenal glands and bladder are unremarkable except for a stable 1.4 cm right adrenal adenoma and bilateral renal cysts. Stomach/Bowel: Stomach is within normal limits. Appendix appears normal. No evidence of bowel wall thickening, distention, or inflammatory changes. Colonic diverticulosis again identified. Vascular/Lymphatic: Aortic atherosclerosis. No enlarged abdominal or pelvic lymph nodes. Reproductive: Mild prostate enlargement again noted Other: No free fluid, focal collection or pneumoperitoneum. Musculoskeletal: No suspicious focal bony lesions or acute abnormality. IMPRESSION:  New focal streaky opacities within the peripheral posterior right lower lobe and anteromedial left upper lobe- likely atelectasis with infection or other process is less likely. Recommend attention to these areas on future scans. Small right pleural effusion, slightly increased from 08/28/2016. No evidence of pleural mass or enhancement. No evidence of malignancy/ metastatic disease within the abdomen or pelvis. Aortic Atherosclerosis (ICD10-I70.0) and Emphysema (ICD10-J43.9). Electronically Signed   By: JMargarette CanadaM.D.   On: 12/07/2016 15:13    CT CHEST, ABDOMEN, AND PELVIS WITH CONTRAST 08/28/2016  IMPRESSION: Postop changes in right hemithorax. No evidence of recurrent or metastatic carcinoma within the  chest, abdomen, or pelvis.  Stable incidental findings include a absent, small benign right adrenal adenoma, sigmoid diverticulosis, and aortic atherosclerosis  PATHOLOGY:    ASSESSMENT AND PLAN:  Stage IIIA (V1SJ2T0) adenocarcinoma of right lung, S/P R VATS, RML lobectomy, and mediastinal lymph node dissection by Dr. Roxan Hockey on 12/30/2015.  On adjuvant systemic chemotherapy consisting of Carboplatin/Alimta beginning on 02/16/2016- 05/31/2016.  S/P XRT finishing on 08/01/2016.  Now on consolidative therapy with Imfinzi beginning on 09/04/2016.  PLAN: Patient tolerating treatment very well. Proceed with cycle 8 of imfinzi today.  Continue with Imfinzi every 2 weeks for up to 52 weeks. He is not having any side effects associated with immunotherapy.  I have reviewed his restaging CT C/A/P in detail with him and his daughter today. He has a new 1 x 2.5 cm focal opacity within the peripheral posterior right lower lobe and new 0.7 x 1.5 cm similar appearing opacity within the peripheral anteromedial left upper lobe which may represent atelectasis. We will do close follow up and repeat his CT scan in 2 months.   Patient's hyponatremia has been getting a little worse, sodium 130 today. He does  drink 12 glasses of fluids a day, I have asked him to cut back to 6-8 glasses. Continue to monitor. If it continues to decline despite restricting his fluids more, we may need to put his on sodium chloride tabs if it drops below 125. Patient is asymptomatic from the hyponatremia.  RTC in 4 weeks for follow up.    THERAPY PLAN:  Continue Imfinzi every 2 weeks for up to 52 weeks while being on the look out for side effects associated with immunotherapy.  All questions were answered. The patient knows to call the clinic with any problems, questions or concerns. We can certainly see the patient much sooner if necessary.    This note is electronically signed by: Twana First, MD 12/11/2016 12:32 PM

## 2016-12-11 NOTE — Patient Instructions (Addendum)
[facility] Discharge Instructions for Patients Receiving Chemotherapy   Beginning January 23rd 2017 lab work for the [facility] will be done in the  Main lab at [facility] on 1st floor. If you have a lab appointment with the [facility] please come in thru the  Main Entrance and check in at the main information desk   Today you received the following chemotherapy agents Imfinzi as well as Vit B12 injection Follow-up as scheduled. Call clinic for any questions or concerns  To help prevent nausea and vomiting after your treatment, we encourage you to take your nausea medication   If you develop nausea and vomiting, or diarrhea that is not controlled by your medication, call the clinic.  The clinic phone number is [phone]. Office hours are Monday-Friday 8:30am-5:00pm.  BELOW ARE SYMPTOMS THAT SHOULD BE REPORTED IMMEDIATELY:  *FEVER GREATER THAN 101.0 F  *CHILLS WITH OR WITHOUT FEVER  NAUSEA AND VOMITING THAT IS NOT CONTROLLED WITH YOUR NAUSEA MEDICATION  *UNUSUAL SHORTNESS OF BREATH  *UNUSUAL BRUISING OR BLEEDING  TENDERNESS IN MOUTH AND THROAT WITH OR WITHOUT PRESENCE OF ULCERS  *URINARY PROBLEMS  *BOWEL PROBLEMS  UNUSUAL RASH Items with * indicate a potential emergency and should be followed up as soon as possible. If you have an emergency after office hours please contact your primary care physician or go to the nearest emergency department.  Please call the clinic during office hours if you have any questions or concerns.   You may also contact the Patient Navigator at [phone] should you have any questions or need assistance in obtaining follow up care.      Resources For Cancer Patients and their Caregivers ? American Cancer Society: Can assist with transportation, wigs, general needs, runs Look Good Feel Better.        606-377-8587 ? Cancer Care: Provides financial assistance, online support groups, medication/co-pay  assistance.  1-800-813-HOPE 678-409-3312) ? [place] Assists [place] cancer patients and their families through emotional , educational and financial support.  (608)053-6888 ? [place] DSS Where to apply for food stamps, Medicaid and utility assistance. 782-028-3139 ? [employer]: Transportation to medical appointments. 970-245-6271 ? Social Security Administration: May apply for disability if have a Stage IV cancer. [phone] ? [employer], Disability and Transit Services: Assists with nutrition, care and transit needs. [phone]

## 2016-12-11 NOTE — Progress Notes (Signed)
[name] tolerated Imfinzi infusion and Vit B12 injection well without complaints or incident.Labs reviewed with Dr. Talbert Cage prior to administering this medication. VSS upon discharge. Pt discharged self ambulatory using cane in satisfactory condition accompanied by his wife

## 2016-12-18 ENCOUNTER — Other Ambulatory Visit: Payer: Self-pay | Admitting: Dermatology

## 2016-12-25 ENCOUNTER — Encounter (HOSPITAL_COMMUNITY): Payer: Self-pay

## 2016-12-25 ENCOUNTER — Encounter (HOSPITAL_BASED_OUTPATIENT_CLINIC_OR_DEPARTMENT_OTHER): Payer: Medicare Other

## 2016-12-25 VITALS — BP 126/52 | HR 75 | Temp 97.7°F | Resp 18 | Wt 201.4 lb

## 2016-12-25 DIAGNOSIS — C342 Malignant neoplasm of middle lobe, bronchus or lung: Secondary | ICD-10-CM

## 2016-12-25 DIAGNOSIS — Z5112 Encounter for antineoplastic immunotherapy: Secondary | ICD-10-CM

## 2016-12-25 DIAGNOSIS — C3491 Malignant neoplasm of unspecified part of right bronchus or lung: Secondary | ICD-10-CM

## 2016-12-25 DIAGNOSIS — R918 Other nonspecific abnormal finding of lung field: Secondary | ICD-10-CM | POA: Diagnosis not present

## 2016-12-25 LAB — CBC WITH DIFFERENTIAL/PLATELET
Basophils Absolute: 0 10*3/uL (ref 0.0–0.1)
Basophils Relative: 0 %
Eosinophils Absolute: 0.9 10*3/uL — ABNORMAL HIGH (ref 0.0–0.7)
Eosinophils Relative: 11 %
HCT: 34.2 % — ABNORMAL LOW (ref 39.0–52.0)
Hemoglobin: 11.6 g/dL — ABNORMAL LOW (ref 13.0–17.0)
Lymphocytes Relative: 18 %
Lymphs Abs: 1.5 10*3/uL (ref 0.7–4.0)
MCH: 29.2 pg (ref 26.0–34.0)
MCHC: 33.9 g/dL (ref 30.0–36.0)
MCV: 86.1 fL (ref 78.0–100.0)
Monocytes Absolute: 0.8 10*3/uL (ref 0.1–1.0)
Monocytes Relative: 10 %
Neutro Abs: 5.1 10*3/uL (ref 1.7–7.7)
Neutrophils Relative %: 61 %
Platelets: 232 10*3/uL (ref 150–400)
RBC: 3.97 MIL/uL — ABNORMAL LOW (ref 4.22–5.81)
RDW: 13.2 % (ref 11.5–15.5)
WBC: 8.4 10*3/uL (ref 4.0–10.5)

## 2016-12-25 LAB — COMPREHENSIVE METABOLIC PANEL
ALT: 16 U/L — ABNORMAL LOW (ref 17–63)
AST: 22 U/L (ref 15–41)
Albumin: 3.4 g/dL — ABNORMAL LOW (ref 3.5–5.0)
Alkaline Phosphatase: 60 U/L (ref 38–126)
Anion gap: 9 (ref 5–15)
BUN: 12 mg/dL (ref 6–20)
CO2: 27 mmol/L (ref 22–32)
Calcium: 8.9 mg/dL (ref 8.9–10.3)
Chloride: 98 mmol/L — ABNORMAL LOW (ref 101–111)
Creatinine, Ser: 1.06 mg/dL (ref 0.61–1.24)
GFR calc Af Amer: >60 mL/min (ref >60)
GFR calc non Af Amer: >60 mL/min (ref >60)
Glucose, Bld: 138 mg/dL — ABNORMAL HIGH (ref 65–99)
Potassium: 3.6 mmol/L (ref 3.5–5.1)
Sodium: 134 mmol/L — ABNORMAL LOW (ref 135–145)
Total Bilirubin: 0.7 mg/dL (ref 0.3–1.2)
Total Protein: 6.1 g/dL — ABNORMAL LOW (ref 6.5–8.1)

## 2016-12-25 MED ORDER — HEPARIN SOD (PORK) LOCK FLUSH 100 UNIT/ML IV SOLN
INTRAVENOUS | Status: AC
  Filled 2016-12-25: qty 5

## 2016-12-25 MED ORDER — HEPARIN SOD (PORK) LOCK FLUSH 100 UNIT/ML IV SOLN
500.0000 [IU] | Freq: Once | INTRAVENOUS | Status: AC | PRN
  Administered 2016-12-25: 500 [IU]

## 2016-12-25 MED ORDER — SODIUM CHLORIDE 0.9 % IV SOLN
Freq: Once | INTRAVENOUS | Status: AC
  Administered 2016-12-25: 12:00:00 via INTRAVENOUS

## 2016-12-25 MED ORDER — SODIUM CHLORIDE 0.9% FLUSH
10.0000 mL | INTRAVENOUS | Status: DC | PRN
  Administered 2016-12-25: 10 mL
  Filled 2016-12-25: qty 10

## 2016-12-25 MED ORDER — SODIUM CHLORIDE 0.9 % IV SOLN
10.0000 mg/kg | Freq: Once | INTRAVENOUS | Status: AC
  Administered 2016-12-25: 920 mg via INTRAVENOUS
  Filled 2016-12-25: qty 18.4

## 2016-12-25 NOTE — Patient Instructions (Signed)
[facility] Discharge Instructions for Patients Receiving Chemotherapy   Beginning January 23rd 2017 lab work for the [facility] will be done in the  Main lab at [facility] on 1st floor. If you have a lab appointment with the [facility] please come in thru the  Main Entrance and check in at the main information desk   Today you received the following chemotherapy agents Imfinzi. Follow-up as scheduled. Call clinic for any questions or concerns  To help prevent nausea and vomiting after your treatment, we encourage you to take your nausea medication   If you develop nausea and vomiting, or diarrhea that is not controlled by your medication, call the clinic.  The clinic phone number is [phone]. Office hours are Monday-Friday 8:30am-5:00pm.  BELOW ARE SYMPTOMS THAT SHOULD BE REPORTED IMMEDIATELY:  *FEVER GREATER THAN 101.0 F  *CHILLS WITH OR WITHOUT FEVER  NAUSEA AND VOMITING THAT IS NOT CONTROLLED WITH YOUR NAUSEA MEDICATION  *UNUSUAL SHORTNESS OF BREATH  *UNUSUAL BRUISING OR BLEEDING  TENDERNESS IN MOUTH AND THROAT WITH OR WITHOUT PRESENCE OF ULCERS  *URINARY PROBLEMS  *BOWEL PROBLEMS  UNUSUAL RASH Items with * indicate a potential emergency and should be followed up as soon as possible. If you have an emergency after office hours please contact your primary care physician or go to the nearest emergency department.  Please call the clinic during office hours if you have any questions or concerns.   You may also contact the Patient Navigator at [phone] should you have any questions or need assistance in obtaining follow up care.      Resources For Cancer Patients and their Caregivers ? American Cancer Society: Can assist with transportation, wigs, general needs, runs Look Good Feel Better.        [phone] ? Cancer Care: Provides financial assistance, online support groups, medication/co-pay assistance.  1-800-813-HOPE  305-562-7817) ? [place] Assists [place] cancer patients and their families through emotional , educational and financial support.  6146714500 ? [place] DSS Where to apply for food stamps, Medicaid and utility assistance. 651-561-3409 ? [employer]: Transportation to medical appointments. ([phone] 596-9177 ? Social Security Administration: May apply for disability if have a Stage IV cancer. [phone] 231-377-2845 ? [employer], Disability and Transit Services: Assists with nutrition, care and transit needs. [phone]

## 2016-12-25 NOTE — Progress Notes (Signed)
[name] Public tolerated Imfinzi infusion well without complaints or incident. Labs reviewed with Dr. Talbert Cage prior to administering this medication.Pt did experience some mild weakness in his legs and needed assistance to the bathroom but legs felt better when pt got up from chair to be discharged. Pt denied any leg discomfort or neuropathy. VSS upon discharge. Pt discharged via wheelchair in satisfactory condition accompanied by his wife

## 2017-01-08 ENCOUNTER — Encounter (HOSPITAL_COMMUNITY): Payer: Self-pay | Admitting: Adult Health

## 2017-01-08 ENCOUNTER — Encounter (HOSPITAL_BASED_OUTPATIENT_CLINIC_OR_DEPARTMENT_OTHER): Payer: Medicare Other

## 2017-01-08 ENCOUNTER — Encounter (HOSPITAL_BASED_OUTPATIENT_CLINIC_OR_DEPARTMENT_OTHER): Payer: Medicare Other | Admitting: Adult Health

## 2017-01-08 VITALS — BP 147/70 | HR 73 | Temp 97.6°F | Resp 18 | Wt 207.2 lb

## 2017-01-08 DIAGNOSIS — E871 Hypo-osmolality and hyponatremia: Secondary | ICD-10-CM

## 2017-01-08 DIAGNOSIS — R918 Other nonspecific abnormal finding of lung field: Secondary | ICD-10-CM | POA: Diagnosis not present

## 2017-01-08 DIAGNOSIS — C342 Malignant neoplasm of middle lobe, bronchus or lung: Secondary | ICD-10-CM

## 2017-01-08 DIAGNOSIS — C3491 Malignant neoplasm of unspecified part of right bronchus or lung: Secondary | ICD-10-CM

## 2017-01-08 DIAGNOSIS — Z5112 Encounter for antineoplastic immunotherapy: Secondary | ICD-10-CM | POA: Diagnosis present

## 2017-01-08 DIAGNOSIS — E538 Deficiency of other specified B group vitamins: Secondary | ICD-10-CM

## 2017-01-08 LAB — CBC WITH DIFFERENTIAL/PLATELET
Basophils Absolute: 0 10*3/uL (ref 0.0–0.1)
Basophils Relative: 0 %
Eosinophils Absolute: 0.8 10*3/uL — ABNORMAL HIGH (ref 0.0–0.7)
Eosinophils Relative: 10 %
HCT: 34.3 % — ABNORMAL LOW (ref 39.0–52.0)
Hemoglobin: 11.6 g/dL — ABNORMAL LOW (ref 13.0–17.0)
Lymphocytes Relative: 18 %
Lymphs Abs: 1.5 10*3/uL (ref 0.7–4.0)
MCH: 29.2 pg (ref 26.0–34.0)
MCHC: 33.8 g/dL (ref 30.0–36.0)
MCV: 86.4 fL (ref 78.0–100.0)
Monocytes Absolute: 0.9 10*3/uL (ref 0.1–1.0)
Monocytes Relative: 11 %
Neutro Abs: 4.9 10*3/uL (ref 1.7–7.7)
Neutrophils Relative %: 61 %
Platelets: 219 10*3/uL (ref 150–400)
RBC: 3.97 MIL/uL — ABNORMAL LOW (ref 4.22–5.81)
RDW: 13.3 % (ref 11.5–15.5)
WBC: 8.1 10*3/uL (ref 4.0–10.5)

## 2017-01-08 LAB — COMPREHENSIVE METABOLIC PANEL
ALT: 16 U/L — ABNORMAL LOW (ref 17–63)
AST: 24 U/L (ref 15–41)
Albumin: 3.3 g/dL — ABNORMAL LOW (ref 3.5–5.0)
Alkaline Phosphatase: 64 U/L (ref 38–126)
Anion gap: 6 (ref 5–15)
BUN: 10 mg/dL (ref 6–20)
CO2: 26 mmol/L (ref 22–32)
Calcium: 8.5 mg/dL — ABNORMAL LOW (ref 8.9–10.3)
Chloride: 104 mmol/L (ref 101–111)
Creatinine, Ser: 1.02 mg/dL (ref 0.61–1.24)
GFR calc Af Amer: >60 mL/min (ref >60)
GFR calc non Af Amer: >60 mL/min (ref >60)
Glucose, Bld: 207 mg/dL — ABNORMAL HIGH (ref 65–99)
Potassium: 4 mmol/L (ref 3.5–5.1)
Sodium: 136 mmol/L (ref 135–145)
Total Bilirubin: 0.6 mg/dL (ref 0.3–1.2)
Total Protein: 6.2 g/dL — ABNORMAL LOW (ref 6.5–8.1)

## 2017-01-08 LAB — TSH: TSH: 4.24 u[IU]/mL (ref 0.350–4.500)

## 2017-01-08 MED ORDER — SODIUM CHLORIDE 0.9 % IV SOLN
10.0000 mg/kg | Freq: Once | INTRAVENOUS | Status: AC
  Administered 2017-01-08: 920 mg via INTRAVENOUS
  Filled 2017-01-08: qty 8.4

## 2017-01-08 MED ORDER — CYANOCOBALAMIN 1000 MCG/ML IJ SOLN
1000.0000 ug | Freq: Once | INTRAMUSCULAR | Status: AC
  Administered 2017-01-08: 1000 ug via INTRAMUSCULAR
  Filled 2017-01-08: qty 1

## 2017-01-08 MED ORDER — SODIUM CHLORIDE 0.9 % IV SOLN
Freq: Once | INTRAVENOUS | Status: AC
  Administered 2017-01-08: 11:00:00 via INTRAVENOUS

## 2017-01-08 MED ORDER — HEPARIN SOD (PORK) LOCK FLUSH 100 UNIT/ML IV SOLN
500.0000 [IU] | Freq: Once | INTRAVENOUS | Status: AC | PRN
  Administered 2017-01-08: 500 [IU]
  Filled 2017-01-08: qty 5

## 2017-01-08 MED ORDER — HEPARIN SOD (PORK) LOCK FLUSH 100 UNIT/ML IV SOLN
INTRAVENOUS | Status: AC
  Filled 2017-01-08: qty 5

## 2017-01-08 NOTE — Progress Notes (Signed)
[name], [name] [name]   CLINIC:  Medical Oncology/Hematology  PCP:  [name] [id number] [phone]   REASON FOR VISIT:  Follow-up for Stage IIIA adenocarcinoma of right lung AND vitamin B12 deficiency  CURRENT THERAPY: Maintenance Imfinzi, beginning 09/04/16 AND vitamin B12 injection monthly   BRIEF ONCOLOGIC HISTORY:    Adenocarcinoma of right lung, stage 3 ([name])   11/10/2015 Imaging    13 mm spiculated lesion seen in right middle lobe.      11/30/2015 PET scan    Spiculated right middle lobe nodule is mildly hypermetabolic and most consistent with a stage IA adenocarcinoma.      12/30/2015 Surgery    Right middle lobectomy and node dissection      12/30/2015 Procedure    Right video-assisted thoracoscopy, Thoracoscopic right middle lobectomy, Mediastinal lymph node dissection, and On-Q local anesthetic catheter placement.      01/02/2016 Pathology Results    1. Lung, resection (segmental or lobe), Right Middle Lobe - INVASIVE ADENOCARCINOMA, WELL DIFFERENTIATED, SPANNING 1.2 CM. - THE SURGICAL RESECTION MARGINS ARE NEGATIVE FOR CARCINOMA. 2. Lymph node, biopsy, Level 12 - METASTATIC CARCINOMA IN 1 OF 1 LYMPH NODE (1/1). 3. Lymph node, biopsy, Level 12 #2 - METASTATIC CARCINOMA IN 1 OF 1 LYMPH NODE (1/1). 4. Lymph node, biopsy, Level 7 - METASTATIC CARCINOMA IN 1 OF 1 LYMPH NODE (1/1/). 5. Lymph node, biopsy, Level 7 #2 - METASTATIC CARCINOMA IN 1 OF 1 LYMPH NODE (1/1). 6. Lymph node, biopsy, Level 7 #3 - METASTATIC CARCINOMA IN 1 OF 1 LYMPH NODE (1/1). 7. Lymph node, biopsy, Level 7 #4 - METASTATIC CARCINOMA IN 1 OF 1 LYMPH NODE (1/1). 8. Lymph node, biopsy, 4R - THERE IS NO EVIDENCE OF CARCINOMA IN 1 OF 1 LYMPH NODE (0/1). 9. Lymph node, biopsy, 4R #2 - THERE IS NO EVIDENCE OF CARCINOMA IN 1 OF 1 LYMPH NODE (0/1).      01/05/2016 Pathology Results    PDL1 NEGATIVE- tumor proportion score  of 0%.       01/05/2016 Pathology Results    Genomic alterations identified: BRAF V600E, KIT amplification, PDGFRA amplification, CDKN2A/B loss, TP53 S18f*33.  Additional findings: MSI-STABLE.  No reportable alterations identified: EGFR, KRAS, ALK, MET, RET, ERBB2, ROS1      02/09/2016 Procedure    Port placed by IR.      02/16/2016 - 05/31/2016 Chemotherapy    Carboplatin/Pemetrexed x 6 cycles        - 08/01/2016 Radiation Therapy    [name], [name]      08/28/2016 Imaging    CT CAP- No evidence of recurrent or metastatic carcinoma within the chest, abdomen, or pelvis.  Stable incidental findings include a absent, small benign right adrenal adenoma, sigmoid diverticulosis, and aortic atherosclerosis.      09/04/2016 -  Chemotherapy    Imfinzi immunotherapy up to 52 weeks.       12/07/2016 Imaging    CT C/A/P: New focal streaky opacities within the peripheral posterior right lower lobe and anteromedial left upper lobe- likely atelectasis with infection or other process is less likely. Recommend attention to these areas on future scans.  Small right pleural effusion, slightly increased from 08/28/2016. No evidence of pleural mass or enhancement.  No evidence of malignancy/ metastatic disease within the abdomen or pelvis.          INTERVAL HISTORY:  Mr. [id number]. male returns for follow-up for  lung cancer.   Due today for next cycle maintenance Imfinzi.  He is here today with his wife.  Overall, he tells me that he has been feeling well.  He has occasional weakness and fatigue, which he describes as ~65% of his baseline.  Denies any fever; he has occasional chills, "but it's just that I stay cold all the time."  He has intermittent diarrhea and constipation "that goes back and forth."  Bowel changes are no worse.  He has some dizziness, which has been going on for years; "it started before my cancer diagnosis."  Endorses some peripheral neuropathy, which is no worse.  Also reports some easy bruising/bleeding.  Denies any active bleeding including blood in his stools, hematuria, nosebleeds, or gingival bleeding.  Denies rash or pruritis. He has chronic back pain, which is intermittent; he rates pain 3/10.   He has been trying to cut back a little on how much water he drinks since he was told at his last visit that his sodium was too low.  He now drinks about 6-8 glasses of water/day; also drinks Gatorade/Powerade as well.      REVIEW OF SYSTEMS:  Review of Systems  Constitutional: Positive for fatigue. Negative for chills and fever.  HENT:  Negative.  Negative for lump/mass and nosebleeds.   Eyes: Negative.   Respiratory: Negative.  Negative for cough and shortness of breath.   Cardiovascular: Negative.  Negative for chest pain and leg swelling.  Gastrointestinal: Positive for constipation and diarrhea. Negative for abdominal pain, blood in stool, nausea and vomiting.  Endocrine: Negative.   Genitourinary: Negative.  Negative for dysuria and hematuria.   Musculoskeletal: Negative for arthralgias.  Skin: Negative.  Negative for rash.  Neurological: Positive for dizziness, extremity weakness and numbness. Negative for headaches.  Hematological: Negative for adenopathy. Bruises/bleeds easily.  Psychiatric/Behavioral: Negative.  Negative for depression and sleep disturbance. The patient is not nervous/anxious.      PAST MEDICAL/SURGICAL HISTORY:  Past Medical History:  Diagnosis Date  . Adenocarcinoma of lung, stage 3 (HCC)    Stage IIIA  . Adenocarcinoma of right lung, stage 3 ([name]) 12/02/2015  . Arthritis   . Atrial fibrillation, transient ([name])    transient postop, < 24 hours  . B12 deficiency   . Cyst of scrotum   . Diverticulitis   . GERD (gastroesophageal reflux disease)   . Hernia   . History of pneumonia   . Hyperlipidemia   . Lung nodule    Spiculated right middle lobe nodule, hypermetabolic  . Type 2 diabetes mellitus ([name])    Past  Surgical History:  Procedure Laterality Date  . BACK SURGERY    . CHOLECYSTECTOMY    . COLONOSCOPY N/A 11/24/2015   Procedure: COLONOSCOPY;  Surgeon: [name];  Location: [name];  Service: Endoscopy;  Laterality: N/A;  730  . COLONOSCOPY W/ POLYPECTOMY     x 5  . CYST EXCISION     Scrotum  . EYE SURGERY Bilateral    Cataract with Lens  . HERNIA REPAIR Right    Inguinal  . IR GENERIC HISTORICAL  02/09/2016   IR US GUIDE VASC ACCESS RIGHT 02/09/2016 Arne Cleveland, [name]  . IR GENERIC HISTORICAL  02/09/2016   IR FLUORO GUIDE CV LINE RIGHT 02/09/2016 Arne Cleveland, [name]  . LUMBAR DISC SURGERY     per patient report  . VIDEO ASSISTED THORACOSCOPY (VATS)/ LOBECTOMY Right 12/30/2015   Procedure: VIDEO ASSISTED THORACOSCOPY (VATS)/RIGHT  MIDDLE LOBECTOMY;  Surgeon: Melrose Nakayama, [name];  Location: [place];  Service: Thoracic;  Laterality: Right;     SOCIAL HISTORY:  Social History   Social History  . Marital status: Married    Spouse name: N/A  . Number of children: N/A  . Years of education: N/A   Occupational History  . Not on file.   Social History Main Topics  . Smoking status: Former Smoker    Types: Cigarettes    Quit date: 05/27/2004  . Smokeless tobacco: Never Used  . Alcohol use No  . Drug use: No  . Sexual activity: Not on file     Comment: married   Other Topics Concern  . Not on file   Social History Narrative  . No narrative on file    FAMILY HISTORY:  Family History  Problem Relation Age of Onset  . Colon cancer Brother     CURRENT MEDICATIONS:  Outpatient Encounter Prescriptions as of 01/08/2017  Medication Sig  . aspirin EC 81 MG tablet Take 81 mg by mouth daily.  . calcium carbonate (OS-CAL - DOSED IN MG OF ELEMENTAL CALCIUM) 1250 (500 Ca) MG tablet Take 1 tablet (500 mg of elemental calcium total) by mouth daily with breakfast.  . Cholecalciferol (VITAMIN D) 2000 UNITS CAPS Take 1 capsule by mouth daily.     . Cyanocobalamin (VITAMIN B-12 IJ) Inject 1 Dose as directed every 30 (thirty) days.  Hunt Oris (IMFINZI IV) Inject into the vein.  Marland Kitchen glipiZIDE (GLUCOTROL) 5 MG tablet Take 1 tablet (5 mg total) by mouth 2 (two) times daily before a meal. (Patient taking differently: Take 2.5 mg by mouth 2 (two) times daily before a meal. )  . lidocaine-prilocaine (EMLA) cream Apply a quarter size amount to port site 1 hour prior to chemo. Do not rub in. Cover with plastic wrap.  . lisinopril (PRINIVIL,ZESTRIL) 40 MG tablet Take 1 tablet (40 mg total) by mouth daily.  Marland Kitchen loratadine (CLARITIN) 10 MG tablet Take 10 mg by mouth daily.  . meclizine (ANTIVERT) 25 MG tablet Take 25 mg by mouth daily as needed for dizziness.  . ondansetron (ZOFRAN) 8 MG tablet Take 1 tablet (8 mg total) by mouth every 8 (eight) hours as needed for nausea or vomiting.  . pantoprazole (PROTONIX) 40 MG tablet Take 40 mg by mouth daily.   . prochlorperazine (COMPAZINE) 10 MG tablet Take 1 tablet (10 mg total) by mouth every 6 (six) hours as needed for nausea or vomiting.  . simvastatin (ZOCOR) 80 MG tablet Take 40 mg by mouth at bedtime.   Marland Kitchen terazosin (HYTRIN) 5 MG capsule Take 5 mg by mouth at bedtime.  Marland Kitchen tiZANidine (ZANAFLEX) 4 MG tablet Take 4 mg by mouth at bedtime.  Marland Kitchen HYDROcodone-acetaminophen (NORCO/VICODIN) 5-325 MG tablet Take 1-2 tablets by mouth every 4 (four) hours as needed for moderate pain. (Patient not taking: Reported on 12/11/2016)  . [EXPIRED] cyanocobalamin ((VITAMIN B-12)) injection 1,000 mcg    No facility-administered encounter medications on file as of 01/08/2017.     ALLERGIES:  Allergies  Allergen Reactions  . Morphine And Related Other (See Comments)    confusion     PHYSICAL EXAM:  ECOG Performance status: 1 - Symptomatic; remains largely independent (ambulates with walker)     Physical Exam  Constitutional: He is oriented to person, place, and time and well-developed, well-nourished, and in no  distress.  HENT:  Head: Normocephalic.  Mouth/Throat: Oropharynx is clear and moist. No oropharyngeal  exudate.  Eyes: Pupils are equal, round, and reactive to light. Conjunctivae are normal. No scleral icterus.  Neck: Normal range of motion. Neck supple.  Cardiovascular: Normal rate and regular rhythm.   Pulmonary/Chest: Effort normal. No respiratory distress.  Diminished breath sounds bilat bases   Abdominal: Soft. Bowel sounds are normal. There is no tenderness.  Musculoskeletal: Normal range of motion. He exhibits edema (Trace ankle edema).  Lymphadenopathy:    He has no cervical adenopathy.  Neurological: He is alert and oriented to person, place, and time. No cranial nerve deficit.  Ambulating with walker   Skin: Skin is warm and dry.  Psychiatric: Mood, memory, affect and judgment normal.  Nursing note and vitals reviewed.    LABORATORY DATA:  I have reviewed the labs as listed.  CBC    Component Value Date/Time   WBC 8.1 01/08/2017 0946   RBC 3.97 (L) 01/08/2017 0946   HGB 11.6 (L) 01/08/2017 0946   HCT 34.3 (L) 01/08/2017 0946   PLT 219 01/08/2017 0946   MCV 86.4 01/08/2017 0946   MCH 29.2 01/08/2017 0946   MCHC 33.8 01/08/2017 0946   RDW 13.3 01/08/2017 0946   LYMPHSABS 1.5 01/08/2017 0946   MONOABS 0.9 01/08/2017 0946   EOSABS 0.8 (H) 01/08/2017 0946   BASOSABS 0.0 01/08/2017 0946   CMP Latest Ref Rng & Units 01/08/2017 12/25/2016 12/11/2016  Glucose 65 - 99 mg/dL 207(H) 138(H) 149(H)  BUN 6 - 20 mg/dL '10 12 13  ' Creatinine 0.61 - 1.24 mg/dL 1.02 1.06 1.12  Sodium 135 - 145 mmol/L 136 134(L) 130(L)  Potassium 3.5 - 5.1 mmol/L 4.0 3.6 3.8  Chloride 101 - 111 mmol/L 104 98(L) 96(L)  CO2 22 - 32 mmol/L '26 27 26  ' Calcium 8.9 - 10.3 mg/dL 8.5(L) 8.9 9.0  Total Protein 6.5 - 8.1 g/dL 6.2(L) 6.1(L) 6.6  Total Bilirubin 0.3 - 1.2 mg/dL 0.6 0.7 0.7  Alkaline Phos 38 - 126 U/L 64 60 68  AST 15 - 41 U/L '24 22 26  ' ALT 17 - 63 U/L 16(L) 16(L) 19    PENDING LABS:     DIAGNOSTIC IMAGING:  *The following radiologic images and reports have been reviewed independently and agree with below findings.  CT chest/abd/pelvis: 12/07/16      PATHOLOGY:  Lung resection surgical path: 12/30/15         ASSESSMENT & PLAN:   Stage IIIA adenocarcinoma of right lung:  -Diagnosed in 11/2015. Treated with RML lobectomy/VATS with mediastinal lymph node dissection. PDL-1 negative. Underwent chemotherapy with Carbo/Alimta x 6 cycles, completing chemo on 05/31/16. Also had chest radiation in [place] which completed on 08/01/16.  Maintenance Imfinzi initiated on 09/04/16 with plans to continue for total of 1 year.  -CT chest/abd/pelvis on 12/07/16 reviewed again in detail with patient and wife today; we also reviewed the images together as well.  CT chest with new focal streaky opacities in peripheral posterior RLL and LUL, likely atelectasis with infection; radiologist recommended attention on follow-up scans. There was no other evidence of metastatic disease in abd or pelvis.   -Labs reviewed. Due for Imfinzi today and will proceed with treatment as scheduled.  -Will order short-interval CT chest with contrast to be done sometime in early September to follow-up on findings from CT in 11/2016.  Orders placed today.   -Continue Imfinzi every 2 weeks as scheduled. He is tolerating very well.  -Return to cancer center in ~6 weeks to review CT results and for subsequent Imfinzi treatment.  Hyponatremia:  -Improved with mild water restriction. Na 136 today.  -Recommended he continue these efforts going forward and explained the role of water-restriction in managing low sodium, as well as dangers of severe hyponatremia.   Fall prevention:  -Strongly recommended he continue using his walker and/or cane to ambulate.   Vitamin B12 deficiency:  -Continue monthly vitamin B12 injections.      Dispo:  -Continue Imfinzi every 2 weeks.  -CT chest with contrast in early 02/2017;  orders placed today.  -Return to cancer center in ~6 weeks for follow-up.    All questions were answered to patient's stated satisfaction. Encouraged patient to call with any new concerns or questions before his next visit to the cancer center and we can certain see him sooner, if needed.    Plan of care discussed with Dr. Talbert Cage, who agrees with the above aforementioned.    Orders placed this encounter:  Orders Placed This Encounter  Procedures  . CT CHEST [place], NP [facility] [phone]

## 2017-01-08 NOTE — Patient Instructions (Signed)
[facility] Discharge Instructions  RECOMMENDATIONS MADE BY THE CONSULTANT AND ANY TEST RESULTS WILL BE SENT TO YOUR REFERRING PHYSICIAN.  You were seen today by Gretchen Dawson NP.   Thank you for choosing [facility] to provide your oncology and hematology care.  To afford each patient quality time with our provider, please arrive at least 15 minutes before your scheduled appointment time.    If you have a lab appointment with the Cancer Center please come in thru the  Main Entrance and check in at the main information desk  You need to re-schedule your appointment should you arrive 10 or more minutes late.  We strive to give you quality time with our providers, and arriving late affects you and other patients whose appointments are after yours.  Also, if you no show three or more times for appointments you may be dismissed from the clinic at the providers discretion.     Again, thank you for choosing [facility].  Our hope is that these requests will decrease the amount of time that you wait before being seen by our physicians.       _____________________________________________________________  Should you have questions after your visit to [facility], please contact our office at [phone] between the hours of 8:30 a.m. and 4:30 p.m.  Voicemails left after 4:30 p.m. will not be returned until the following business day.  For prescription refill requests, have your pharmacy contact our office.       Resources For Cancer Patients and their Caregivers ? American Cancer Society: Can assist with transportation, wigs, general needs, runs Look Good Feel Better.        1-888-227-6333 ? Cancer Care: Provides financial assistance, online support groups, medication/co-pay assistance.  1-800-813-HOPE (4673) ? [facility] Assists [place] cancer patients and  their families through emotional , educational and financial support.  336-427-4357 ? [place] DSS Where to apply for food stamps, Medicaid and utility assistance. 336-342-1394 ? RCATS: Transportation to medical appointments. 336-347-2287 ? Social Security Administration: May apply for disability if have a Stage IV cancer. 336-342-7796 1-800-772-1213 ? [place] Aging, Disability and Transit Services: Assists with nutrition, care and transit needs. 336-349-2343  Cancer Center Support Programs: @[email or web address]@ > Cancer Support Group  2nd Tuesday of the month 1pm-2pm, Journey Room  > Creative Journey  3rd Tuesday of the month 1130am-1pm, Journey Room  > Look Good Feel Better  1st Wednesday of the month 10am-12 noon, Journey Room (Call American Cancer Society to register 1-800-395-5775)    

## 2017-01-08 NOTE — Progress Notes (Signed)
Tolerated infusion w/o adverse reaction.  Alert, in no distress.  VSS.  Discharged ambulatory in c/o spouse.  

## 2017-01-21 ENCOUNTER — Other Ambulatory Visit (HOSPITAL_COMMUNITY): Payer: Self-pay | Admitting: Oncology

## 2017-01-22 ENCOUNTER — Encounter (HOSPITAL_COMMUNITY): Payer: Medicare Other | Attending: Hematology & Oncology

## 2017-01-22 ENCOUNTER — Encounter (HOSPITAL_COMMUNITY): Payer: Self-pay

## 2017-01-22 ENCOUNTER — Other Ambulatory Visit (HOSPITAL_COMMUNITY): Payer: Self-pay | Admitting: Adult Health

## 2017-01-22 VITALS — BP 130/49 | HR 85 | Temp 97.6°F | Resp 18 | Wt 202.8 lb

## 2017-01-22 DIAGNOSIS — C342 Malignant neoplasm of middle lobe, bronchus or lung: Secondary | ICD-10-CM | POA: Diagnosis not present

## 2017-01-22 DIAGNOSIS — R918 Other nonspecific abnormal finding of lung field: Secondary | ICD-10-CM | POA: Insufficient documentation

## 2017-01-22 DIAGNOSIS — E538 Deficiency of other specified B group vitamins: Secondary | ICD-10-CM | POA: Diagnosis present

## 2017-01-22 DIAGNOSIS — D649 Anemia, unspecified: Secondary | ICD-10-CM | POA: Insufficient documentation

## 2017-01-22 DIAGNOSIS — C3491 Malignant neoplasm of unspecified part of right bronchus or lung: Secondary | ICD-10-CM

## 2017-01-22 DIAGNOSIS — R7989 Other specified abnormal findings of blood chemistry: Secondary | ICD-10-CM

## 2017-01-22 DIAGNOSIS — Z5112 Encounter for antineoplastic immunotherapy: Secondary | ICD-10-CM | POA: Diagnosis present

## 2017-01-22 LAB — COMPREHENSIVE METABOLIC PANEL
ALT: 16 U/L — ABNORMAL LOW (ref 17–63)
AST: 23 U/L (ref 15–41)
Albumin: 3.5 g/dL (ref 3.5–5.0)
Alkaline Phosphatase: 58 U/L (ref 38–126)
Anion gap: 8 (ref 5–15)
BUN: 15 mg/dL (ref 6–20)
CO2: 25 mmol/L (ref 22–32)
Calcium: 8.9 mg/dL (ref 8.9–10.3)
Chloride: 101 mmol/L (ref 101–111)
Creatinine, Ser: 1.05 mg/dL (ref 0.61–1.24)
GFR calc Af Amer: >60 mL/min (ref >60)
GFR calc non Af Amer: >60 mL/min (ref >60)
Glucose, Bld: 101 mg/dL — ABNORMAL HIGH (ref 65–99)
Potassium: 3.7 mmol/L (ref 3.5–5.1)
Sodium: 134 mmol/L — ABNORMAL LOW (ref 135–145)
Total Bilirubin: 0.5 mg/dL (ref 0.3–1.2)
Total Protein: 6.5 g/dL (ref 6.5–8.1)

## 2017-01-22 LAB — CBC WITH DIFFERENTIAL/PLATELET
Basophils Absolute: 0 10*3/uL (ref 0.0–0.1)
Basophils Relative: 0 %
Eosinophils Absolute: 0.6 10*3/uL (ref 0.0–0.7)
Eosinophils Relative: 8 %
HCT: 34.1 % — ABNORMAL LOW (ref 39.0–52.0)
Hemoglobin: 11.8 g/dL — ABNORMAL LOW (ref 13.0–17.0)
Lymphocytes Relative: 23 %
Lymphs Abs: 1.6 10*3/uL (ref 0.7–4.0)
MCH: 29.9 pg (ref 26.0–34.0)
MCHC: 34.6 g/dL (ref 30.0–36.0)
MCV: 86.3 fL (ref 78.0–100.0)
Monocytes Absolute: 0.7 10*3/uL (ref 0.1–1.0)
Monocytes Relative: 10 %
Neutro Abs: 4.1 10*3/uL (ref 1.7–7.7)
Neutrophils Relative %: 59 %
Platelets: 221 10*3/uL (ref 150–400)
RBC: 3.95 MIL/uL — ABNORMAL LOW (ref 4.22–5.81)
RDW: 13.7 % (ref 11.5–15.5)
WBC: 7 10*3/uL (ref 4.0–10.5)

## 2017-01-22 LAB — TSH: TSH: 5.751 u[IU]/mL — ABNORMAL HIGH (ref 0.350–4.500)

## 2017-01-22 MED ORDER — SODIUM CHLORIDE 0.9 % IV SOLN
Freq: Once | INTRAVENOUS | Status: AC
  Administered 2017-01-22: 12:00:00 via INTRAVENOUS

## 2017-01-22 MED ORDER — SODIUM CHLORIDE 0.9 % IV SOLN
10.5000 mg/kg | Freq: Once | INTRAVENOUS | Status: AC
  Administered 2017-01-22: 980 mg via INTRAVENOUS
  Filled 2017-01-22: qty 19.6

## 2017-01-22 MED ORDER — HEPARIN SOD (PORK) LOCK FLUSH 100 UNIT/ML IV SOLN
500.0000 [IU] | Freq: Once | INTRAVENOUS | Status: AC | PRN
  Administered 2017-01-22: 500 [IU]
  Filled 2017-01-22: qty 5

## 2017-01-22 MED ORDER — SODIUM CHLORIDE 0.9% FLUSH
10.0000 mL | INTRAVENOUS | Status: DC | PRN
  Administered 2017-01-22: 10 mL
  Filled 2017-01-22: qty 10

## 2017-01-22 NOTE — Progress Notes (Signed)
Patient tolerated treatment today without incidence. Patient discharged ambulatory with walker and in stable condition with daughter. Patient to follow up as scheduled.

## 2017-01-22 NOTE — Patient Instructions (Signed)
[facility] Discharge Instructions for Patients Receiving Chemotherapy   If you have a lab appointment with the [place] please come in thru the  Main Entrance and check in at the main information desk   Today you received the following chemotherapy agents: Imfinzi  To help prevent nausea and vomiting after your treatment, we encourage you to take your nausea medication as prescribed.   If you develop nausea and vomiting, or diarrhea that is not controlled by your medication, call the clinic.  The clinic phone number is [phone]. Office hours are Monday-Friday 8:30am-5:00pm.  BELOW ARE SYMPTOMS THAT SHOULD BE REPORTED IMMEDIATELY:  *FEVER GREATER THAN 101.0 F  *CHILLS WITH OR WITHOUT FEVER  NAUSEA AND VOMITING THAT IS NOT CONTROLLED WITH YOUR NAUSEA MEDICATION  *UNUSUAL SHORTNESS OF BREATH  *UNUSUAL BRUISING OR BLEEDING  TENDERNESS IN MOUTH AND THROAT WITH OR WITHOUT PRESENCE OF ULCERS  *URINARY PROBLEMS  *BOWEL PROBLEMS  UNUSUAL RASH Items with * indicate a potential emergency and should be followed up as soon as possible. If you have an emergency after office hours please contact your primary care physician or go to the nearest emergency department.  Please call the clinic during office hours if you have any questions or concerns.   You may also contact the Patient Navigator at [phone] should you have any questions or need assistance in obtaining follow up care.      Resources For Cancer Patients and their Caregivers ? American Cancer Society: Can assist with transportation, wigs, general needs, runs Look Good Feel Better.        630-751-4125 ? Cancer Care: Provides financial assistance, online support groups, medication/co-pay assistance.  1-800-813-HOPE 838-203-7844) ? [facility] Assists [place] cancer patients and their families through emotional , educational and financial support.   513-463-9426 ? [place] DSS Where to apply for food stamps, Medicaid and utility assistance. 873 879 3563 ? RCATS: Transportation to medical appointments. (630)248-5059 ? Social Security Administration: May apply for disability if have a Stage IV cancer. 708 220 2682 717 363 3074 ? [employer], Disability and Transit Services: Assists with nutrition, care and transit needs. 715-537-6155

## 2017-01-22 NOTE — Progress Notes (Signed)
Labs reviewed with Mike Craze, NP okay to proceed with treatment.

## 2017-01-31 ENCOUNTER — Other Ambulatory Visit (HOSPITAL_COMMUNITY): Payer: Self-pay | Admitting: Hematology & Oncology

## 2017-01-31 ENCOUNTER — Other Ambulatory Visit (HOSPITAL_COMMUNITY): Payer: Self-pay | Admitting: Oncology

## 2017-02-05 ENCOUNTER — Encounter (HOSPITAL_COMMUNITY): Payer: Self-pay

## 2017-02-05 ENCOUNTER — Encounter (HOSPITAL_BASED_OUTPATIENT_CLINIC_OR_DEPARTMENT_OTHER): Payer: Medicare Other

## 2017-02-05 VITALS — BP 122/50 | HR 86 | Temp 98.3°F | Resp 18 | Wt 203.2 lb

## 2017-02-05 DIAGNOSIS — Z5112 Encounter for antineoplastic immunotherapy: Secondary | ICD-10-CM

## 2017-02-05 DIAGNOSIS — R918 Other nonspecific abnormal finding of lung field: Secondary | ICD-10-CM | POA: Diagnosis not present

## 2017-02-05 DIAGNOSIS — R7989 Other specified abnormal findings of blood chemistry: Secondary | ICD-10-CM

## 2017-02-05 DIAGNOSIS — C342 Malignant neoplasm of middle lobe, bronchus or lung: Secondary | ICD-10-CM | POA: Diagnosis not present

## 2017-02-05 DIAGNOSIS — E538 Deficiency of other specified B group vitamins: Secondary | ICD-10-CM

## 2017-02-05 DIAGNOSIS — C3491 Malignant neoplasm of unspecified part of right bronchus or lung: Secondary | ICD-10-CM

## 2017-02-05 LAB — CBC WITH DIFFERENTIAL/PLATELET
Basophils Absolute: 0 10*3/uL (ref 0.0–0.1)
Basophils Relative: 0 %
Eosinophils Absolute: 0.4 10*3/uL (ref 0.0–0.7)
Eosinophils Relative: 6 %
HCT: 33.1 % — ABNORMAL LOW (ref 39.0–52.0)
Hemoglobin: 11.3 g/dL — ABNORMAL LOW (ref 13.0–17.0)
Lymphocytes Relative: 15 %
Lymphs Abs: 1.1 10*3/uL (ref 0.7–4.0)
MCH: 29.6 pg (ref 26.0–34.0)
MCHC: 34.1 g/dL (ref 30.0–36.0)
MCV: 86.6 fL (ref 78.0–100.0)
Monocytes Absolute: 1 10*3/uL (ref 0.1–1.0)
Monocytes Relative: 14 %
Neutro Abs: 5 10*3/uL (ref 1.7–7.7)
Neutrophils Relative %: 65 %
Platelets: 246 10*3/uL (ref 150–400)
RBC: 3.82 MIL/uL — ABNORMAL LOW (ref 4.22–5.81)
RDW: 13.6 % (ref 11.5–15.5)
WBC: 7.6 10*3/uL (ref 4.0–10.5)

## 2017-02-05 LAB — COMPREHENSIVE METABOLIC PANEL
ALT: 16 U/L — ABNORMAL LOW (ref 17–63)
AST: 25 U/L (ref 15–41)
Albumin: 3.3 g/dL — ABNORMAL LOW (ref 3.5–5.0)
Alkaline Phosphatase: 63 U/L (ref 38–126)
Anion gap: 8 (ref 5–15)
BUN: 10 mg/dL (ref 6–20)
CO2: 24 mmol/L (ref 22–32)
Calcium: 8.8 mg/dL — ABNORMAL LOW (ref 8.9–10.3)
Chloride: 100 mmol/L — ABNORMAL LOW (ref 101–111)
Creatinine, Ser: 1.09 mg/dL (ref 0.61–1.24)
GFR calc Af Amer: >60 mL/min (ref >60)
GFR calc non Af Amer: >60 mL/min (ref >60)
Glucose, Bld: 169 mg/dL — ABNORMAL HIGH (ref 65–99)
Potassium: 3.7 mmol/L (ref 3.5–5.1)
Sodium: 132 mmol/L — ABNORMAL LOW (ref 135–145)
Total Bilirubin: 0.5 mg/dL (ref 0.3–1.2)
Total Protein: 6.2 g/dL — ABNORMAL LOW (ref 6.5–8.1)

## 2017-02-05 LAB — TSH: TSH: 4.139 u[IU]/mL (ref 0.350–4.500)

## 2017-02-05 LAB — T4, FREE: Free T4: 1.09 ng/dL (ref 0.61–1.12)

## 2017-02-05 MED ORDER — SODIUM CHLORIDE 0.9 % IV SOLN
Freq: Once | INTRAVENOUS | Status: AC
  Administered 2017-02-05: 500 mL via INTRAVENOUS

## 2017-02-05 MED ORDER — SODIUM CHLORIDE 0.9% FLUSH
10.0000 mL | INTRAVENOUS | Status: DC | PRN
  Administered 2017-02-05: 10 mL
  Filled 2017-02-05: qty 10

## 2017-02-05 MED ORDER — DURVALUMAB 500 MG/10ML IV SOLN
10.5000 mg/kg | Freq: Once | INTRAVENOUS | Status: AC
  Administered 2017-02-05: 980 mg via INTRAVENOUS
  Filled 2017-02-05: qty 9.6

## 2017-02-05 MED ORDER — HEPARIN SOD (PORK) LOCK FLUSH 100 UNIT/ML IV SOLN
500.0000 [IU] | Freq: Once | INTRAVENOUS | Status: AC | PRN
  Administered 2017-02-05: 500 [IU]

## 2017-02-05 MED ORDER — CYANOCOBALAMIN 1000 MCG/ML IJ SOLN
1000.0000 ug | Freq: Once | INTRAMUSCULAR | Status: AC
  Administered 2017-02-05: 1000 ug via INTRAMUSCULAR
  Filled 2017-02-05: qty 1

## 2017-02-05 NOTE — Patient Instructions (Signed)
[facility] Discharge Instructions for Patients Receiving Chemotherapy   Beginning January 23rd 2017 lab work for the Cancer Center will be done in the  Main lab at [facility] on 1st floor. If you have a lab appointment with the Cancer Center please come in thru the  Main Entrance and check in at the main information desk   Today you received the following chemotherapy agents Imfinzi  To help prevent nausea and vomiting after your treatment, we encourage you to take your nausea medication   If you develop nausea and vomiting, or diarrhea that is not controlled by your medication, call the clinic.  The clinic phone number is [phone]. Office hours are Monday-Friday 8:30am-5:00pm.  BELOW ARE SYMPTOMS THAT SHOULD BE REPORTED IMMEDIATELY:  *FEVER GREATER THAN 101.0 F  *CHILLS WITH OR WITHOUT FEVER  NAUSEA AND VOMITING THAT IS NOT CONTROLLED WITH YOUR NAUSEA MEDICATION  *UNUSUAL SHORTNESS OF BREATH  *UNUSUAL BRUISING OR BLEEDING  TENDERNESS IN MOUTH AND THROAT WITH OR WITHOUT PRESENCE OF ULCERS  *URINARY PROBLEMS  *BOWEL PROBLEMS  UNUSUAL RASH Items with * indicate a potential emergency and should be followed up as soon as possible. If you have an emergency after office hours please contact your primary care physician or go to the nearest emergency department.  Please call the clinic during office hours if you have any questions or concerns.   You may also contact the Patient Navigator at [phone] should you have any questions or need assistance in obtaining follow up care.      Resources For Cancer Patients and their Caregivers ? American Cancer Society: Can assist with transportation, wigs, general needs, runs Look Good Feel Better.        1-888-227-6333 ? Cancer Care: Provides financial assistance, online support groups, medication/co-pay assistance.  1-800-813-HOPE (4673) ? [facility] Assists [place] cancer  patients and their families through emotional , educational and financial support.  336-427-4357 ? [place] DSS Where to apply for food stamps, Medicaid and utility assistance. 336-342-1394 ? RCATS: Transportation to medical appointments. 336-347-2287 ? Social Security Administration: May apply for disability if have a Stage IV cancer. 336-342-7796 1-800-772-1213 ? [place] Aging, Disability and Transit Services: Assists with nutrition, care and transit needs. 336-349-2343          

## 2017-02-05 NOTE — Progress Notes (Signed)
Labs reviewed with Mike Craze NP. Proceed with treatment.    [name] presents today for injection per MD orders. B12 1,019mcg administered IM in left Upper Arm. Administration without incident. Patient tolerated well.  Infusion given per orders. Patient tolerated it well without problems. Vitals stable and discharged home from clinic via wheelchair. Follow up as scheduled.

## 2017-02-06 LAB — T3: T3, Total: 98 ng/dL (ref 71–180)

## 2017-02-12 ENCOUNTER — Ambulatory Visit (HOSPITAL_COMMUNITY)
Admission: RE | Admit: 2017-02-12 | Discharge: 2017-02-12 | Disposition: A | Discharge status: Home / Self Care | Payer: Medicare Other | Source: Ambulatory Visit | Attending: Adult Health | Admitting: Adult Health

## 2017-02-12 DIAGNOSIS — I700 Atherosclerosis of aorta: Secondary | ICD-10-CM | POA: Diagnosis not present

## 2017-02-12 DIAGNOSIS — J90 Pleural effusion, not elsewhere classified: Secondary | ICD-10-CM | POA: Insufficient documentation

## 2017-02-12 DIAGNOSIS — J439 Emphysema, unspecified: Secondary | ICD-10-CM | POA: Diagnosis not present

## 2017-02-12 DIAGNOSIS — Z9049 Acquired absence of other specified parts of digestive tract: Secondary | ICD-10-CM | POA: Diagnosis not present

## 2017-02-12 DIAGNOSIS — I2510 Atherosclerotic heart disease of native coronary artery without angina pectoris: Secondary | ICD-10-CM | POA: Diagnosis not present

## 2017-02-12 DIAGNOSIS — C3491 Malignant neoplasm of unspecified part of right bronchus or lung: Secondary | ICD-10-CM

## 2017-02-12 DIAGNOSIS — M47814 Spondylosis without myelopathy or radiculopathy, thoracic region: Secondary | ICD-10-CM | POA: Diagnosis not present

## 2017-02-12 MED ORDER — IOPAMIDOL (ISOVUE-300) INJECTION 61%
75.0000 mL | Freq: Once | INTRAVENOUS | Status: AC | PRN
  Administered 2017-02-12: 75 mL via INTRAVENOUS

## 2017-02-19 ENCOUNTER — Ambulatory Visit (HOSPITAL_COMMUNITY): Payer: Medicare Other

## 2017-02-19 ENCOUNTER — Encounter (HOSPITAL_BASED_OUTPATIENT_CLINIC_OR_DEPARTMENT_OTHER): Payer: Medicare Other | Admitting: Oncology

## 2017-02-19 ENCOUNTER — Encounter (HOSPITAL_COMMUNITY): Payer: Medicare Other | Attending: Hematology & Oncology

## 2017-02-19 ENCOUNTER — Encounter (HOSPITAL_COMMUNITY): Payer: Self-pay

## 2017-02-19 VITALS — BP 127/77 | HR 87 | Temp 97.8°F | Resp 18 | Wt 201.4 lb

## 2017-02-19 DIAGNOSIS — Z5112 Encounter for antineoplastic immunotherapy: Secondary | ICD-10-CM | POA: Diagnosis present

## 2017-02-19 DIAGNOSIS — R918 Other nonspecific abnormal finding of lung field: Secondary | ICD-10-CM | POA: Insufficient documentation

## 2017-02-19 DIAGNOSIS — E538 Deficiency of other specified B group vitamins: Secondary | ICD-10-CM | POA: Diagnosis present

## 2017-02-19 DIAGNOSIS — C342 Malignant neoplasm of middle lobe, bronchus or lung: Secondary | ICD-10-CM | POA: Diagnosis present

## 2017-02-19 DIAGNOSIS — E871 Hypo-osmolality and hyponatremia: Secondary | ICD-10-CM | POA: Diagnosis not present

## 2017-02-19 DIAGNOSIS — C3491 Malignant neoplasm of unspecified part of right bronchus or lung: Secondary | ICD-10-CM

## 2017-02-19 DIAGNOSIS — D649 Anemia, unspecified: Secondary | ICD-10-CM | POA: Diagnosis present

## 2017-02-19 LAB — COMPREHENSIVE METABOLIC PANEL
ALT: 22 U/L (ref 17–63)
AST: 28 U/L (ref 15–41)
Albumin: 3 g/dL — ABNORMAL LOW (ref 3.5–5.0)
Alkaline Phosphatase: 65 U/L (ref 38–126)
Anion gap: 10 (ref 5–15)
BUN: 12 mg/dL (ref 6–20)
CO2: 22 mmol/L (ref 22–32)
Calcium: 8.6 mg/dL — ABNORMAL LOW (ref 8.9–10.3)
Chloride: 101 mmol/L (ref 101–111)
Creatinine, Ser: 1.08 mg/dL (ref 0.61–1.24)
GFR calc Af Amer: >60 mL/min (ref >60)
GFR calc non Af Amer: >60 mL/min (ref >60)
Glucose, Bld: 234 mg/dL — ABNORMAL HIGH (ref 65–99)
Potassium: 3.6 mmol/L (ref 3.5–5.1)
Sodium: 133 mmol/L — ABNORMAL LOW (ref 135–145)
Total Bilirubin: 0.6 mg/dL (ref 0.3–1.2)
Total Protein: 5.9 g/dL — ABNORMAL LOW (ref 6.5–8.1)

## 2017-02-19 LAB — CBC WITH DIFFERENTIAL/PLATELET
Basophils Absolute: 0 10*3/uL (ref 0.0–0.1)
Basophils Relative: 0 %
Eosinophils Absolute: 0.3 10*3/uL (ref 0.0–0.7)
Eosinophils Relative: 4 %
HCT: 31.1 % — ABNORMAL LOW (ref 39.0–52.0)
Hemoglobin: 10.6 g/dL — ABNORMAL LOW (ref 13.0–17.0)
Lymphocytes Relative: 13 %
Lymphs Abs: 1.1 10*3/uL (ref 0.7–4.0)
MCH: 29.4 pg (ref 26.0–34.0)
MCHC: 34.1 g/dL (ref 30.0–36.0)
MCV: 86.1 fL (ref 78.0–100.0)
Monocytes Absolute: 1 10*3/uL (ref 0.1–1.0)
Monocytes Relative: 12 %
Neutro Abs: 5.6 10*3/uL (ref 1.7–7.7)
Neutrophils Relative %: 71 %
Platelets: 277 10*3/uL (ref 150–400)
RBC: 3.61 MIL/uL — ABNORMAL LOW (ref 4.22–5.81)
RDW: 13.3 % (ref 11.5–15.5)
WBC: 8 10*3/uL (ref 4.0–10.5)

## 2017-02-19 MED ORDER — SODIUM CHLORIDE 0.9 % IV SOLN
10.5000 mg/kg | Freq: Once | INTRAVENOUS | Status: AC
  Administered 2017-02-19: 980 mg via INTRAVENOUS
  Filled 2017-02-19: qty 10

## 2017-02-19 MED ORDER — SODIUM CHLORIDE 0.9% FLUSH
10.0000 mL | INTRAVENOUS | Status: DC | PRN
  Administered 2017-02-19: 10 mL
  Filled 2017-02-19: qty 10

## 2017-02-19 MED ORDER — HEPARIN SOD (PORK) LOCK FLUSH 100 UNIT/ML IV SOLN
500.0000 [IU] | Freq: Once | INTRAVENOUS | Status: AC | PRN
  Administered 2017-02-19: 500 [IU]
  Filled 2017-02-19: qty 5

## 2017-02-19 MED ORDER — SODIUM CHLORIDE 0.9 % IV SOLN
Freq: Once | INTRAVENOUS | Status: AC
  Administered 2017-02-19: 11:00:00 via INTRAVENOUS

## 2017-02-19 NOTE — Progress Notes (Signed)
Labs reviewed with MD, proceed with treatment.  Treatment given per orders. Patient tolerated it well without problems. Vitals stable and discharged home from clinic ambulatory. Follow up as scheduled.

## 2017-02-19 NOTE — Patient Instructions (Signed)
[facility] Discharge Instructions for Patients Receiving Chemotherapy   Beginning January 23rd 2017 lab work for the Cancer Center will be done in the  Main lab at [place] on 1st floor. If you have a lab appointment with the Cancer Center please come in thru the  Main Entrance and check in at the main information desk   Today you received the following chemotherapy agents   To help prevent nausea and vomiting after your treatment, we encourage you to take your nausea medication     If you develop nausea and vomiting, or diarrhea that is not controlled by your medication, call the clinic.  The clinic phone number is [phone]. Office hours are Monday-Friday 8:30am-5:00pm.  BELOW ARE SYMPTOMS THAT SHOULD BE REPORTED IMMEDIATELY:  *FEVER GREATER THAN 101.0 F  *CHILLS WITH OR WITHOUT FEVER  NAUSEA AND VOMITING THAT IS NOT CONTROLLED WITH YOUR NAUSEA MEDICATION  *UNUSUAL SHORTNESS OF BREATH  *UNUSUAL BRUISING OR BLEEDING  TENDERNESS IN MOUTH AND THROAT WITH OR WITHOUT PRESENCE OF ULCERS  *URINARY PROBLEMS  *BOWEL PROBLEMS  UNUSUAL RASH Items with * indicate a potential emergency and should be followed up as soon as possible. If you have an emergency after office hours please contact your primary care physician or go to the nearest emergency department.  Please call the clinic during office hours if you have any questions or concerns.   You may also contact the Patient Navigator at [phone] should you have any questions or need assistance in obtaining follow up care.      Resources For Cancer Patients and their Caregivers ? American Cancer Society: Can assist with transportation, wigs, general needs, runs Look Good Feel Better.        1-888-227-6333 ? Cancer Care: Provides financial assistance, online support groups, medication/co-pay assistance.  1-800-813-HOPE (4673) ? [facility] Assists [place] cancer  patients and their families through emotional , educational and financial support.  336-427-4357 ? [place] DSS Where to apply for food stamps, Medicaid and utility assistance. 336-342-1394 ? [facility]: Transportation to medical appointments. 336-347-2287 ? Social Security Administration: May apply for disability if have a Stage IV cancer. 336-342-7796 1-800-772-1213 ? [place] Aging, Disability and Transit Services: Assists with nutrition, care and transit needs. [phone]         

## 2017-02-19 NOTE — Progress Notes (Signed)
[place], [place] [address]   CLINIC:  Medical Oncology/Hematology  PCP:  Gary Blitz, MD [place] [id number] [phone]   REASON FOR VISIT:  Follow-up for Stage IIIA adenocarcinoma of right lung AND vitamin B12 deficiency  CURRENT THERAPY: Maintenance Imfinzi, beginning 09/04/16 AND vitamin B12 injection monthly   BRIEF ONCOLOGIC HISTORY:    Adenocarcinoma of right lung, stage 3 ([place])   11/10/2015 Imaging    13 mm spiculated lesion seen in right middle lobe.      11/30/2015 PET scan    Spiculated right middle lobe nodule is mildly hypermetabolic and most consistent with a stage IA adenocarcinoma.      12/30/2015 Surgery    Right middle lobectomy and node dissection      12/30/2015 Procedure    Right video-assisted thoracoscopy, Thoracoscopic right middle lobectomy, Mediastinal lymph node dissection, and On-Q local anesthetic catheter placement.      01/02/2016 Pathology Results    1. Lung, resection (segmental or lobe), Right Middle Lobe - INVASIVE ADENOCARCINOMA, WELL DIFFERENTIATED, SPANNING 1.2 CM. - THE SURGICAL RESECTION MARGINS ARE NEGATIVE FOR CARCINOMA. 2. Lymph node, biopsy, Level 12 - METASTATIC CARCINOMA IN 1 OF 1 LYMPH NODE (1/1). 3. Lymph node, biopsy, Level 12 #2 - METASTATIC CARCINOMA IN 1 OF 1 LYMPH NODE (1/1). 4. Lymph node, biopsy, Level 7 - METASTATIC CARCINOMA IN 1 OF 1 LYMPH NODE (1/1/). 5. Lymph node, biopsy, Level 7 #2 - METASTATIC CARCINOMA IN 1 OF 1 LYMPH NODE (1/1). 6. Lymph node, biopsy, Level 7 #3 - METASTATIC CARCINOMA IN 1 OF 1 LYMPH NODE (1/1). 7. Lymph node, biopsy, Level 7 #4 - METASTATIC CARCINOMA IN 1 OF 1 LYMPH NODE (1/1). 8. Lymph node, biopsy, 4R - THERE IS NO EVIDENCE OF CARCINOMA IN 1 OF 1 LYMPH NODE (0/1). 9. Lymph node, biopsy, 4R #2 - THERE IS NO EVIDENCE OF CARCINOMA IN 1 OF 1 LYMPH NODE (0/1).      01/05/2016 Pathology Results    PDL1 NEGATIVE- tumor proportion score  of 0%.       01/05/2016 Pathology Results    Genomic alterations identified: BRAF V600E, KIT amplification, PDGFRA amplification, CDKN2A/B loss, TP53 S20f*33.  Additional findings: MSI-STABLE.  No reportable alterations identified: EGFR, KRAS, ALK, MET, RET, ERBB2, ROS1      02/09/2016 Procedure    Port placed by IR.      02/16/2016 - 05/31/2016 Chemotherapy    Carboplatin/Pemetrexed x 6 cycles        - 08/01/2016 Radiation Therapy    [place], [place]      08/28/2016 Imaging    CT CAP- No evidence of recurrent or metastatic carcinoma within the chest, abdomen, or pelvis.  Stable incidental findings include a absent, small benign right adrenal adenoma, sigmoid diverticulosis, and aortic atherosclerosis.      09/04/2016 -  Chemotherapy    Imfinzi immunotherapy up to 52 weeks.       12/07/2016 Imaging    CT C/A/P: New focal streaky opacities within the peripheral posterior right lower lobe and anteromedial left upper lobe- likely atelectasis with infection or other process is less likely. Recommend attention to these areas on future scans.  Small right pleural effusion, slightly increased from 08/28/2016. No evidence of pleural mass or enhancement.  No evidence of malignancy/ metastatic disease within the abdomen or pelvis.       02/12/2017 Imaging    CT chest: IMPRESSION: 1. Stable CT of the chest. No  specific findings identified to suggest residual or recurrence of tumor. 2. Persistent right pleural effusion. 3. Paramediastinal radiation change predominantly involving the right lung is similar to previous study. 4. Stable right adrenal gland nodule and low-density foci within the liver. 5. Aortic Atherosclerosis (ICD10-I70.0) and Emphysema (ICD10-J43.9). Multi vessel coronary artery calcifications noted.         INTERVAL HISTORY:  [name] 83 y.o. male returns for follow-up for lung cancer.   He is due  today for next cycle maintenance Imfinzi.  He is here  today with his wife.  Overall, he tells me that he has been feeling well. He's had some intermittent cough with white sputum and throat irrititation. He continues to take claritin and mucinex for his symptoms. He continues to have chronic fatigue. Energy level is at 75% of his baseline.  He continues to have some dizziness, which has been going on for years; "it started before my cancer diagnosis", but denise any recent falls. Continues to have some peripheral neuropathy, which is no worse. Also reports some easy bruising/bleeding.  Denies any active bleeding including blood in his stools, hematuria, nosebleeds, or gingival bleeding.  Denies rash or pruritis. No pain today. No recent falls.  REVIEW OF SYSTEMS:  Review of Systems  Constitutional: Positive for fatigue. Negative for chills and fever.  HENT:  Negative.  Negative for lump/mass and nosebleeds.   Eyes: Negative.   Respiratory: Negative.  Negative for cough and shortness of breath.   Cardiovascular: Negative.  Negative for chest pain and leg swelling.  Gastrointestinal: Positive for diarrhea. Negative for abdominal pain, blood in stool, constipation, nausea and vomiting.  Endocrine: Negative.   Genitourinary: Negative.  Negative for dysuria and hematuria.   Musculoskeletal: Negative for arthralgias.  Skin: Negative.  Negative for rash.  Neurological: Positive for dizziness, extremity weakness and numbness. Negative for headaches.  Hematological: Negative for adenopathy. Bruises/bleeds easily.  Psychiatric/Behavioral: Negative.  Negative for depression and sleep disturbance. The patient is not nervous/anxious.      PAST MEDICAL/SURGICAL HISTORY:  Past Medical History:  Diagnosis Date  . Adenocarcinoma of lung, stage 3 ([facility])    Stage IIIA  . Adenocarcinoma of right lung, stage 3 ([place]) 12/02/2015  . Arthritis   . Atrial fibrillation, transient ([place])    transient postop, < 24 hours  . B12 deficiency   . Cyst of scrotum   .  Diverticulitis   . GERD (gastroesophageal reflux disease)   . Hernia   . History of pneumonia   . Hyperlipidemia   . Lung nodule    Spiculated right middle lobe nodule, hypermetabolic  . Type 2 diabetes mellitus ([place])    Past Surgical History:  Procedure Laterality Date  . BACK SURGERY    . CHOLECYSTECTOMY    . COLONOSCOPY N/A 11/24/2015   Procedure: COLONOSCOPY;  Surgeon: Rogene Houston, MD;  Location: [facility];  Service: Endoscopy;  Laterality: N/A;  730  . COLONOSCOPY W/ POLYPECTOMY     x 5  . CYST EXCISION     Scrotum  . EYE SURGERY Bilateral    Cataract with Lens  . HERNIA REPAIR Right    Inguinal  . IR GENERIC HISTORICAL  02/09/2016   IR US GUIDE VASC ACCESS RIGHT 02/09/2016 Arne Cleveland, MD [facility]  . IR GENERIC HISTORICAL  02/09/2016   IR FLUORO GUIDE CV LINE RIGHT 02/09/2016 Arne Cleveland, MD [facility]  . LUMBAR DISC SURGERY     per patient report  . VIDEO  ASSISTED THORACOSCOPY (VATS)/ LOBECTOMY Right 12/30/2015   Procedure: VIDEO ASSISTED THORACOSCOPY (VATS)/RIGHT MIDDLE LOBECTOMY;  Surgeon: Melrose Nakayama, MD;  Location: [facility];  Service: Thoracic;  Laterality: Right;     SOCIAL HISTORY:  Social History   Social History  . Marital status: Married    Spouse name: N/A  . Number of children: N/A  . Years of education: N/A   Occupational History  . Not on file.   Social History Main Topics  . Smoking status: Former Smoker    Types: Cigarettes    Quit date: 05/27/2004  . Smokeless tobacco: Never Used  . Alcohol use No  . Drug use: No  . Sexual activity: Not on file     Comment: married   Other Topics Concern  . Not on file   Social History Narrative  . No narrative on file    FAMILY HISTORY:  Family History  Problem Relation Age of Onset  . Colon cancer Brother     CURRENT MEDICATIONS:  Outpatient Encounter Prescriptions as of 02/19/2017  Medication Sig  . aspirin EC 81 MG tablet Take 81 mg by mouth daily.  . calcium  carbonate (OS-CAL - DOSED IN MG OF ELEMENTAL CALCIUM) 1250 (500 Ca) MG tablet Take 1 tablet (500 mg of elemental calcium total) by mouth daily with breakfast.  . Cholecalciferol (VITAMIN D) 2000 UNITS CAPS Take 1 capsule by mouth daily.   . Cyanocobalamin (VITAMIN B-12 IJ) Inject 1 Dose as directed every 30 (thirty) days.  Hunt Oris (IMFINZI IV) Inject into the vein.  Marland Kitchen glipiZIDE (GLUCOTROL) 5 MG tablet Take 1 tablet (5 mg total) by mouth 2 (two) times daily before a meal. (Patient taking differently: Take 2.5 mg by mouth 2 (two) times daily before a meal. )  . lidocaine-prilocaine (EMLA) cream Apply a quarter size amount to port site 1 hour prior to chemo. Do not rub in. Cover with plastic wrap.  . lisinopril (PRINIVIL,ZESTRIL) 40 MG tablet Take 1 tablet (40 mg total) by mouth daily.  Marland Kitchen loratadine (CLARITIN) 10 MG tablet Take 10 mg by mouth daily.  . meclizine (ANTIVERT) 25 MG tablet Take 25 mg by mouth daily as needed for dizziness.  . ondansetron (ZOFRAN) 8 MG tablet Take 1 tablet (8 mg total) by mouth every 8 (eight) hours as needed for nausea or vomiting.  . pantoprazole (PROTONIX) 40 MG tablet Take 40 mg by mouth daily.   . prochlorperazine (COMPAZINE) 10 MG tablet Take 1 tablet (10 mg total) by mouth every 6 (six) hours as needed for nausea or vomiting.  . prochlorperazine (COMPAZINE) 10 MG tablet TAKE ONE TABLET BY MOUTH EVERY 6 HOURS AS NEEDED FOR NAUSEA AND VOMITING  . simvastatin (ZOCOR) 80 MG tablet Take 40 mg by mouth at bedtime.   Marland Kitchen terazosin (HYTRIN) 5 MG capsule Take 5 mg by mouth at bedtime.  Marland Kitchen tiZANidine (ZANAFLEX) 4 MG tablet Take 4 mg by mouth at bedtime.  . [DISCONTINUED] HYDROcodone-acetaminophen (NORCO/VICODIN) 5-325 MG tablet Take 1-2 tablets by mouth every 4 (four) hours as needed for moderate pain. (Patient not taking: Reported on 12/11/2016)   No facility-administered encounter medications on file as of 02/19/2017.     ALLERGIES:  Allergies  Allergen Reactions  .  Morphine And Related Other (See Comments)    confusion     PHYSICAL EXAM:  ECOG Performance status: 1 - Symptomatic; remains largely independent (ambulates with walker)     Physical Exam  Constitutional: He is oriented to person,  place, and time and well-developed, well-nourished, and in no distress.  HENT:  Head: Normocephalic.  Mouth/Throat: Oropharynx is clear and moist. No oropharyngeal exudate.  Eyes: Pupils are equal, round, and reactive to light. Conjunctivae are normal. No scleral icterus.  Neck: Normal range of motion. Neck supple.  Cardiovascular: Normal rate and regular rhythm.   Pulmonary/Chest: Effort normal. No respiratory distress.     Abdominal: Soft. Bowel sounds are normal. There is no tenderness.  Musculoskeletal: Normal range of motion. He exhibits no edema.  Lymphadenopathy:    He has no cervical adenopathy.  Neurological: He is alert and oriented to person, place, and time. No cranial nerve deficit.  Ambulating with walker   Skin: Skin is warm and dry.  Psychiatric: Mood, memory, affect and judgment normal.  Nursing note and vitals reviewed.    LABORATORY DATA:  I have reviewed the labs as listed.  CBC    Component Value Date/Time   WBC 8.0 02/19/2017 1020   RBC 3.61 (L) 02/19/2017 1020   HGB 10.6 (L) 02/19/2017 1020   HCT 31.1 (L) 02/19/2017 1020   PLT 277 02/19/2017 1020   MCV 86.1 02/19/2017 1020   MCH 29.4 02/19/2017 1020   MCHC 34.1 02/19/2017 1020   RDW 13.3 02/19/2017 1020   LYMPHSABS 1.1 02/19/2017 1020   MONOABS 1.0 02/19/2017 1020   EOSABS 0.3 02/19/2017 1020   BASOSABS 0.0 02/19/2017 1020   CMP Latest Ref Rng & Units 02/19/2017 02/05/2017 01/22/2017  Glucose 65 - 99 mg/dL 234(H) 169(H) 101(H)  BUN 6 - 20 mg/dL '12 10 15  ' Creatinine 0.61 - 1.24 mg/dL 1.08 1.09 1.05  Sodium 135 - 145 mmol/L 133(L) 132(L) 134(L)  Potassium 3.5 - 5.1 mmol/L 3.6 3.7 3.7  Chloride 101 - 111 mmol/L 101 100(L) 101  CO2 22 - 32 mmol/L '22 24 25  ' Calcium  8.9 - 10.3 mg/dL 8.6(L) 8.8(L) 8.9  Total Protein 6.5 - 8.1 g/dL 5.9(L) 6.2(L) 6.5  Total Bilirubin 0.3 - 1.2 mg/dL 0.6 0.5 0.5  Alkaline Phos 38 - 126 U/L 65 63 58  AST 15 - 41 U/L '28 25 23  ' ALT 17 - 63 U/L 22 16(L) 16(L)    PENDING LABS:    DIAGNOSTIC IMAGING:  *The following radiologic images and reports have been reviewed independently and agree with below findings.  CT chest/abd/pelvis: 12/07/16      PATHOLOGY:  Lung resection surgical path: 12/30/15         ASSESSMENT & PLAN:   Stage IIIA adenocarcinoma of right lung:  -Diagnosed in 11/2015. Treated with RML lobectomy/VATS with mediastinal lymph node dissection. PDL-1 negative. Underwent chemotherapy with Carbo/Alimta x 6 cycles, completing chemo on 05/31/16. Also had chest radiation in [place] which completed on 08/01/16.  Maintenance Imfinzi initiated on 09/04/16 with plans to continue for total of 1 year.  -CT chest/abd/pelvis on 02/12/17 reviewed in detail with patient and wife today, there is no evidence of recurrent or metastatic disease -Labs reviewed. Due for Imfinzi today and will proceed with treatment as scheduled.  -Continue Imfinzi every 2 weeks as scheduled. He is tolerating very well.  -Return to cancer center in ~6 weeks for follow up   Hyponatremia:  -Improved with mild water restriction. Na 132 today.  -Recommended he continue these efforts going forward and explained the role of water-restriction in managing low sodium, as well as dangers of severe hyponatremia.   Fall prevention:  -Strongly recommended he continue using his walker and/or cane to ambulate.  Vitamin B12 deficiency:  -Continue monthly vitamin B12 injections.      Dispo:  -Continue Imfinzi every 2 weeks.  -Return to cancer center in 6 weeks for follow-up.    All questions were answered to patient's stated satisfaction. Encouraged patient to call with any new concerns or questions before his next visit to the cancer center and we  can certain see him sooner, if needed.    Twana First, MD

## 2017-03-05 ENCOUNTER — Encounter (HOSPITAL_COMMUNITY): Payer: Self-pay

## 2017-03-05 ENCOUNTER — Encounter (HOSPITAL_BASED_OUTPATIENT_CLINIC_OR_DEPARTMENT_OTHER): Payer: Medicare Other

## 2017-03-05 VITALS — BP 143/86 | HR 78 | Temp 98.3°F | Resp 18 | Wt 198.8 lb

## 2017-03-05 DIAGNOSIS — C3491 Malignant neoplasm of unspecified part of right bronchus or lung: Secondary | ICD-10-CM

## 2017-03-05 DIAGNOSIS — C342 Malignant neoplasm of middle lobe, bronchus or lung: Secondary | ICD-10-CM | POA: Diagnosis not present

## 2017-03-05 DIAGNOSIS — R918 Other nonspecific abnormal finding of lung field: Secondary | ICD-10-CM | POA: Diagnosis not present

## 2017-03-05 DIAGNOSIS — E538 Deficiency of other specified B group vitamins: Secondary | ICD-10-CM

## 2017-03-05 DIAGNOSIS — Z23 Encounter for immunization: Secondary | ICD-10-CM

## 2017-03-05 DIAGNOSIS — Z5112 Encounter for antineoplastic immunotherapy: Secondary | ICD-10-CM | POA: Diagnosis present

## 2017-03-05 LAB — COMPREHENSIVE METABOLIC PANEL
ALT: 20 U/L (ref 17–63)
AST: 27 U/L (ref 15–41)
Albumin: 3.4 g/dL — ABNORMAL LOW (ref 3.5–5.0)
Alkaline Phosphatase: 63 U/L (ref 38–126)
Anion gap: 8 (ref 5–15)
BUN: 14 mg/dL (ref 6–20)
CO2: 25 mmol/L (ref 22–32)
Calcium: 9.1 mg/dL (ref 8.9–10.3)
Chloride: 99 mmol/L — ABNORMAL LOW (ref 101–111)
Creatinine, Ser: 1.11 mg/dL (ref 0.61–1.24)
GFR calc Af Amer: >60 mL/min (ref >60)
GFR calc non Af Amer: 59 mL/min — ABNORMAL LOW (ref >60)
Glucose, Bld: 116 mg/dL — ABNORMAL HIGH (ref 65–99)
Potassium: 3.8 mmol/L (ref 3.5–5.1)
Sodium: 132 mmol/L — ABNORMAL LOW (ref 135–145)
Total Bilirubin: 0.4 mg/dL (ref 0.3–1.2)
Total Protein: 6.6 g/dL (ref 6.5–8.1)

## 2017-03-05 LAB — CBC WITH DIFFERENTIAL/PLATELET
Basophils Absolute: 0 10*3/uL (ref 0.0–0.1)
Basophils Relative: 0 %
Eosinophils Absolute: 0.3 10*3/uL (ref 0.0–0.7)
Eosinophils Relative: 3 %
HCT: 34.4 % — ABNORMAL LOW (ref 39.0–52.0)
Hemoglobin: 11.8 g/dL — ABNORMAL LOW (ref 13.0–17.0)
Lymphocytes Relative: 19 %
Lymphs Abs: 1.6 10*3/uL (ref 0.7–4.0)
MCH: 29.7 pg (ref 26.0–34.0)
MCHC: 34.3 g/dL (ref 30.0–36.0)
MCV: 86.6 fL (ref 78.0–100.0)
Monocytes Absolute: 0.8 10*3/uL (ref 0.1–1.0)
Monocytes Relative: 9 %
Neutro Abs: 6.1 10*3/uL (ref 1.7–7.7)
Neutrophils Relative %: 69 %
Platelets: 254 10*3/uL (ref 150–400)
RBC: 3.97 MIL/uL — ABNORMAL LOW (ref 4.22–5.81)
RDW: 13.3 % (ref 11.5–15.5)
WBC: 8.8 10*3/uL (ref 4.0–10.5)

## 2017-03-05 LAB — TSH: TSH: 4.398 u[IU]/mL (ref 0.350–4.500)

## 2017-03-05 MED ORDER — SODIUM CHLORIDE 0.9 % IV SOLN
10.6500 mg/kg | Freq: Once | INTRAVENOUS | Status: AC
  Administered 2017-03-05: 980 mg via INTRAVENOUS
  Filled 2017-03-05: qty 9.6

## 2017-03-05 MED ORDER — INFLUENZA VAC SPLIT QUAD 0.5 ML IM SUSY
PREFILLED_SYRINGE | INTRAMUSCULAR | Status: AC
  Filled 2017-03-05: qty 0.5

## 2017-03-05 MED ORDER — HEPARIN SOD (PORK) LOCK FLUSH 100 UNIT/ML IV SOLN
500.0000 [IU] | Freq: Once | INTRAVENOUS | Status: AC | PRN
  Administered 2017-03-05: 500 [IU]
  Filled 2017-03-05 (×2): qty 5

## 2017-03-05 MED ORDER — CYANOCOBALAMIN 1000 MCG/ML IJ SOLN
1000.0000 ug | Freq: Once | INTRAMUSCULAR | Status: AC
  Administered 2017-03-05: 1000 ug via INTRAMUSCULAR
  Filled 2017-03-05: qty 1

## 2017-03-05 MED ORDER — SODIUM CHLORIDE 0.9% FLUSH
10.0000 mL | INTRAVENOUS | Status: DC | PRN
Start: 2017-03-05 — End: 2017-03-05
  Administered 2017-03-05: 10 mL
  Filled 2017-03-05: qty 10

## 2017-03-05 MED ORDER — SODIUM CHLORIDE 0.9 % IV SOLN
Freq: Once | INTRAVENOUS | Status: AC
  Administered 2017-03-05: 13:00:00 via INTRAVENOUS

## 2017-03-05 MED ORDER — INFLUENZA VAC SPLIT QUAD 0.5 ML IM SUSY
0.5000 mL | PREFILLED_SYRINGE | Freq: Once | INTRAMUSCULAR | Status: AC
  Administered 2017-03-05: 0.5 mL via INTRAMUSCULAR

## 2017-03-05 NOTE — Patient Instructions (Signed)
[facility] Discharge Instructions for Patients Receiving Chemotherapy   Beginning January 23rd 2017 lab work for the Cancer Center will be done in the  Main lab at [place] on 1st floor. If you have a lab appointment with the Cancer Center please come in thru the  Main Entrance and check in at the main information desk   Today you received the following chemotherapy agents   To help prevent nausea and vomiting after your treatment, we encourage you to take your nausea medication     If you develop nausea and vomiting, or diarrhea that is not controlled by your medication, call the clinic.  The clinic phone number is [phone]. Office hours are Monday-Friday 8:30am-5:00pm.  BELOW ARE SYMPTOMS THAT SHOULD BE REPORTED IMMEDIATELY:  *FEVER GREATER THAN 101.0 F  *CHILLS WITH OR WITHOUT FEVER  NAUSEA AND VOMITING THAT IS NOT CONTROLLED WITH YOUR NAUSEA MEDICATION  *UNUSUAL SHORTNESS OF BREATH  *UNUSUAL BRUISING OR BLEEDING  TENDERNESS IN MOUTH AND THROAT WITH OR WITHOUT PRESENCE OF ULCERS  *URINARY PROBLEMS  *BOWEL PROBLEMS  UNUSUAL RASH Items with * indicate a potential emergency and should be followed up as soon as possible. If you have an emergency after office hours please contact your primary care physician or go to the nearest emergency department.  Please call the clinic during office hours if you have any questions or concerns.   You may also contact the Patient Navigator at [phone] should you have any questions or need assistance in obtaining follow up care.      Resources For Cancer Patients and their Caregivers ? American Cancer Society: Can assist with transportation, wigs, general needs, runs Look Good Feel Better.        1-888-227-6333 ? Cancer Care: Provides financial assistance, online support groups, medication/co-pay assistance.  1-800-813-HOPE (4673) ? [facility] Assists [place] cancer  patients and their families through emotional , educational and financial support.  336-427-4357 ? [place] DSS Where to apply for food stamps, Medicaid and utility assistance. 336-342-1394 ? [facility]: Transportation to medical appointments. 336-347-2287 ? Social Security Administration: May apply for disability if have a Stage IV cancer. [phone] 1-800-772-1213 ? [place] Aging, Disability and Transit Services: Assists with nutrition, care and transit needs. [phone]         

## 2017-03-05 NOTE — Progress Notes (Signed)
Labs reviewed with MD. Proceed with treatment.  [name] presents today for injection per MD orders. Fluarix 0.56ml administered IM in left Upper Arm. Administration without incident. Patient tolerated well.  [name] presents today for injection per MD orders. B12 1,000mg  administered IM  in left Upper Arm. Administration without incident. Patient tolerated well.  Treatment given per orders. Patient tolerated it well without problems. Vitals stable and discharged home from clinic ambulatory. Follow up as scheduled.

## 2017-03-08 ENCOUNTER — Ambulatory Visit (HOSPITAL_COMMUNITY): Payer: Medicare Other

## 2017-03-19 ENCOUNTER — Ambulatory Visit (HOSPITAL_COMMUNITY): Payer: Medicare Other

## 2017-03-26 ENCOUNTER — Encounter (HOSPITAL_COMMUNITY): Payer: Self-pay

## 2017-03-26 ENCOUNTER — Encounter (HOSPITAL_COMMUNITY): Payer: Medicare Other | Attending: Hematology & Oncology

## 2017-03-26 VITALS — BP 122/55 | HR 90 | Temp 97.5°F | Resp 16 | Wt 201.0 lb

## 2017-03-26 DIAGNOSIS — D649 Anemia, unspecified: Secondary | ICD-10-CM | POA: Insufficient documentation

## 2017-03-26 DIAGNOSIS — R918 Other nonspecific abnormal finding of lung field: Secondary | ICD-10-CM | POA: Insufficient documentation

## 2017-03-26 DIAGNOSIS — C342 Malignant neoplasm of middle lobe, bronchus or lung: Secondary | ICD-10-CM | POA: Diagnosis not present

## 2017-03-26 DIAGNOSIS — E538 Deficiency of other specified B group vitamins: Secondary | ICD-10-CM | POA: Insufficient documentation

## 2017-03-26 DIAGNOSIS — Z5112 Encounter for antineoplastic immunotherapy: Secondary | ICD-10-CM | POA: Diagnosis present

## 2017-03-26 DIAGNOSIS — C3491 Malignant neoplasm of unspecified part of right bronchus or lung: Secondary | ICD-10-CM

## 2017-03-26 LAB — CBC WITH DIFFERENTIAL/PLATELET
Basophils Absolute: 0 10*3/uL (ref 0.0–0.1)
Basophils Relative: 0 %
Eosinophils Absolute: 0.2 10*3/uL (ref 0.0–0.7)
Eosinophils Relative: 2 %
HCT: 33 % — ABNORMAL LOW (ref 39.0–52.0)
Hemoglobin: 11.2 g/dL — ABNORMAL LOW (ref 13.0–17.0)
Lymphocytes Relative: 16 %
Lymphs Abs: 1.4 10*3/uL (ref 0.7–4.0)
MCH: 29.4 pg (ref 26.0–34.0)
MCHC: 33.9 g/dL (ref 30.0–36.0)
MCV: 86.6 fL (ref 78.0–100.0)
Monocytes Absolute: 0.7 10*3/uL (ref 0.1–1.0)
Monocytes Relative: 9 %
Neutro Abs: 6.4 10*3/uL (ref 1.7–7.7)
Neutrophils Relative %: 73 %
Platelets: 221 10*3/uL (ref 150–400)
RBC: 3.81 MIL/uL — ABNORMAL LOW (ref 4.22–5.81)
RDW: 13.3 % (ref 11.5–15.5)
WBC: 8.7 10*3/uL (ref 4.0–10.5)

## 2017-03-26 LAB — COMPREHENSIVE METABOLIC PANEL
ALT: 24 U/L (ref 17–63)
AST: 28 U/L (ref 15–41)
Albumin: 3.4 g/dL — ABNORMAL LOW (ref 3.5–5.0)
Alkaline Phosphatase: 64 U/L (ref 38–126)
Anion gap: 9 (ref 5–15)
BUN: 12 mg/dL (ref 6–20)
CO2: 23 mmol/L (ref 22–32)
Calcium: 8.9 mg/dL (ref 8.9–10.3)
Chloride: 97 mmol/L — ABNORMAL LOW (ref 101–111)
Creatinine, Ser: 1.05 mg/dL (ref 0.61–1.24)
GFR calc Af Amer: >60 mL/min (ref >60)
GFR calc non Af Amer: >60 mL/min (ref >60)
Glucose, Bld: 179 mg/dL — ABNORMAL HIGH (ref 65–99)
Potassium: 3.9 mmol/L (ref 3.5–5.1)
Sodium: 129 mmol/L — ABNORMAL LOW (ref 135–145)
Total Bilirubin: 0.8 mg/dL (ref 0.3–1.2)
Total Protein: 6.3 g/dL — ABNORMAL LOW (ref 6.5–8.1)

## 2017-03-26 MED ORDER — SODIUM CHLORIDE 0.9% FLUSH
10.0000 mL | INTRAVENOUS | Status: DC | PRN
  Administered 2017-03-26: 10 mL
  Filled 2017-03-26: qty 10

## 2017-03-26 MED ORDER — HEPARIN SOD (PORK) LOCK FLUSH 100 UNIT/ML IV SOLN
500.0000 [IU] | Freq: Once | INTRAVENOUS | Status: AC | PRN
  Administered 2017-03-26: 500 [IU]
  Filled 2017-03-26: qty 5

## 2017-03-26 MED ORDER — SODIUM CHLORIDE 0.9 % IV SOLN
Freq: Once | INTRAVENOUS | Status: AC
  Administered 2017-03-26: 12:00:00 via INTRAVENOUS

## 2017-03-26 MED ORDER — SODIUM CHLORIDE 0.9 % IV SOLN
10.7000 mg/kg | Freq: Once | INTRAVENOUS | Status: AC
  Administered 2017-03-26: 980 mg via INTRAVENOUS
  Filled 2017-03-26: qty 9.6

## 2017-03-26 NOTE — Patient Instructions (Signed)
[facility] Discharge Instructions for Patients Receiving Chemotherapy   Beginning January 23rd 2017 lab work for the Cancer Center will be done in the  Main lab at [place] on 1st floor. If you have a lab appointment with the Cancer Center please come in thru the  Main Entrance and check in at the main information desk   Today you received the following chemotherapy agents   To help prevent nausea and vomiting after your treatment, we encourage you to take your nausea medication     If you develop nausea and vomiting, or diarrhea that is not controlled by your medication, call the clinic.  The clinic phone number is [phone]. Office hours are Monday-Friday 8:30am-5:00pm.  BELOW ARE SYMPTOMS THAT SHOULD BE REPORTED IMMEDIATELY:  *FEVER GREATER THAN 101.0 F  *CHILLS WITH OR WITHOUT FEVER  NAUSEA AND VOMITING THAT IS NOT CONTROLLED WITH YOUR NAUSEA MEDICATION  *UNUSUAL SHORTNESS OF BREATH  *UNUSUAL BRUISING OR BLEEDING  TENDERNESS IN MOUTH AND THROAT WITH OR WITHOUT PRESENCE OF ULCERS  *URINARY PROBLEMS  *BOWEL PROBLEMS  UNUSUAL RASH Items with * indicate a potential emergency and should be followed up as soon as possible. If you have an emergency after office hours please contact your primary care physician or go to the nearest emergency department.  Please call the clinic during office hours if you have any questions or concerns.   You may also contact the Patient Navigator at [phone] should you have any questions or need assistance in obtaining follow up care.      Resources For Cancer Patients and their Caregivers ? American Cancer Society: Can assist with transportation, wigs, general needs, runs Look Good Feel Better.        1-888-227-6333 ? Cancer Care: Provides financial assistance, online support groups, medication/co-pay assistance.  1-800-813-HOPE (4673) ? [facility] Assists [place] cancer  patients and their families through emotional , educational and financial support.  336-427-4357 ? [place] DSS Where to apply for food stamps, Medicaid and utility assistance. 336-342-1394 ? [facility]: Transportation to medical appointments. 336-347-2287 ? Social Security Administration: May apply for disability if have a Stage IV cancer. [phone] 1-800-772-1213 ? [place] Aging, Disability and Transit Services: Assists with nutrition, care and transit needs. [phone]         

## 2017-03-26 NOTE — Progress Notes (Signed)
Labs reviewed with MD, proceed with treatment today. Sodium noted by MD.  Treatment given per orders. Patient tolerated it well without problems. Vitals stable and discharged home from clinic ambulatory. Follow up as scheduled.

## 2017-04-02 ENCOUNTER — Ambulatory Visit (HOSPITAL_COMMUNITY): Payer: Medicare Other

## 2017-04-09 ENCOUNTER — Encounter (HOSPITAL_BASED_OUTPATIENT_CLINIC_OR_DEPARTMENT_OTHER): Payer: Medicare Other | Admitting: Oncology

## 2017-04-09 ENCOUNTER — Encounter (HOSPITAL_BASED_OUTPATIENT_CLINIC_OR_DEPARTMENT_OTHER): Payer: Medicare Other

## 2017-04-09 ENCOUNTER — Encounter (HOSPITAL_COMMUNITY): Payer: Self-pay | Admitting: Oncology

## 2017-04-09 VITALS — BP 123/55 | HR 79 | Temp 97.6°F | Resp 18

## 2017-04-09 VITALS — BP 112/46 | HR 89 | Temp 97.8°F | Resp 18 | Ht 70.0 in | Wt 206.6 lb

## 2017-04-09 DIAGNOSIS — C342 Malignant neoplasm of middle lobe, bronchus or lung: Secondary | ICD-10-CM

## 2017-04-09 DIAGNOSIS — C3491 Malignant neoplasm of unspecified part of right bronchus or lung: Secondary | ICD-10-CM

## 2017-04-09 DIAGNOSIS — E538 Deficiency of other specified B group vitamins: Secondary | ICD-10-CM

## 2017-04-09 DIAGNOSIS — Z5112 Encounter for antineoplastic immunotherapy: Secondary | ICD-10-CM | POA: Diagnosis present

## 2017-04-09 DIAGNOSIS — E871 Hypo-osmolality and hyponatremia: Secondary | ICD-10-CM | POA: Diagnosis not present

## 2017-04-09 DIAGNOSIS — R918 Other nonspecific abnormal finding of lung field: Secondary | ICD-10-CM | POA: Diagnosis not present

## 2017-04-09 LAB — COMPREHENSIVE METABOLIC PANEL
ALT: 22 U/L (ref 17–63)
AST: 27 U/L (ref 15–41)
Albumin: 3.2 g/dL — ABNORMAL LOW (ref 3.5–5.0)
Alkaline Phosphatase: 65 U/L (ref 38–126)
Anion gap: 7 (ref 5–15)
BUN: 10 mg/dL (ref 6–20)
CO2: 25 mmol/L (ref 22–32)
Calcium: 8.9 mg/dL (ref 8.9–10.3)
Chloride: 97 mmol/L — ABNORMAL LOW (ref 101–111)
Creatinine, Ser: 0.97 mg/dL (ref 0.61–1.24)
GFR calc Af Amer: >60 mL/min (ref >60)
GFR calc non Af Amer: >60 mL/min (ref >60)
Glucose, Bld: 217 mg/dL — ABNORMAL HIGH (ref 65–99)
Potassium: 3.9 mmol/L (ref 3.5–5.1)
Sodium: 129 mmol/L — ABNORMAL LOW (ref 135–145)
Total Bilirubin: 0.5 mg/dL (ref 0.3–1.2)
Total Protein: 6.1 g/dL — ABNORMAL LOW (ref 6.5–8.1)

## 2017-04-09 LAB — CBC WITH DIFFERENTIAL/PLATELET
Basophils Absolute: 0 10*3/uL (ref 0.0–0.1)
Basophils Relative: 1 %
Eosinophils Absolute: 0.6 10*3/uL (ref 0.0–0.7)
Eosinophils Relative: 8 %
HCT: 34.3 % — ABNORMAL LOW (ref 39.0–52.0)
Hemoglobin: 11.5 g/dL — ABNORMAL LOW (ref 13.0–17.0)
Lymphocytes Relative: 21 %
Lymphs Abs: 1.7 10*3/uL (ref 0.7–4.0)
MCH: 29.2 pg (ref 26.0–34.0)
MCHC: 33.5 g/dL (ref 30.0–36.0)
MCV: 87.1 fL (ref 78.0–100.0)
Monocytes Absolute: 0.6 10*3/uL (ref 0.1–1.0)
Monocytes Relative: 8 %
Neutro Abs: 4.9 10*3/uL (ref 1.7–7.7)
Neutrophils Relative %: 62 %
Platelets: 210 10*3/uL (ref 150–400)
RBC: 3.94 MIL/uL — ABNORMAL LOW (ref 4.22–5.81)
RDW: 13.1 % (ref 11.5–15.5)
WBC: 7.9 10*3/uL (ref 4.0–10.5)

## 2017-04-09 LAB — TSH: TSH: 6.247 u[IU]/mL — ABNORMAL HIGH (ref 0.350–4.500)

## 2017-04-09 MED ORDER — SODIUM CHLORIDE 0.9 % IV SOLN
10.5000 mg/kg | Freq: Once | INTRAVENOUS | Status: AC
  Administered 2017-04-09: 980 mg via INTRAVENOUS
  Filled 2017-04-09: qty 10

## 2017-04-09 MED ORDER — HEPARIN SOD (PORK) LOCK FLUSH 100 UNIT/ML IV SOLN
500.0000 [IU] | Freq: Once | INTRAVENOUS | Status: AC | PRN
  Administered 2017-04-09: 500 [IU]

## 2017-04-09 MED ORDER — SODIUM CHLORIDE 0.9 % IV SOLN
Freq: Once | INTRAVENOUS | Status: AC
  Administered 2017-04-09: 10:00:00 via INTRAVENOUS

## 2017-04-09 MED ORDER — CYANOCOBALAMIN 1000 MCG/ML IJ SOLN
1000.0000 ug | Freq: Once | INTRAMUSCULAR | Status: AC
  Administered 2017-04-09: 1000 ug via INTRAMUSCULAR

## 2017-04-09 NOTE — Progress Notes (Signed)
  [name] [address] [place], [place] [address]   CLINIC:  Medical Oncology/Hematology  PCP:  [name], Ashish, MD [address] [place] [address] [phone]   REASON FOR VISIT:  Follow-up for Stage IIIA adenocarcinoma of right lung AND vitamin B12 deficiency  CURRENT THERAPY: Maintenance Imfinzi, beginning 09/04/16 AND vitamin B12 injection monthly   BRIEF ONCOLOGIC HISTORY:    Adenocarcinoma of right lung, stage 3 (HCC)   11/10/2015 Imaging    13 mm spiculated lesion seen in right middle lobe.      11/30/2015 PET scan    Spiculated right middle lobe nodule is mildly hypermetabolic and most consistent with a stage IA adenocarcinoma.      12/30/2015 Surgery    Right middle lobectomy and node dissection      12/30/2015 Procedure    Right video-assisted thoracoscopy, Thoracoscopic right middle lobectomy, Mediastinal lymph node dissection, and On-Q local anesthetic catheter placement.      01/02/2016 Pathology Results    1. Lung, resection (segmental or lobe), Right Middle Lobe - INVASIVE ADENOCARCINOMA, WELL DIFFERENTIATED, SPANNING 1.2 CM. - THE SURGICAL RESECTION MARGINS ARE NEGATIVE FOR CARCINOMA. 2. Lymph node, biopsy, Level 12 - METASTATIC CARCINOMA IN 1 OF 1 LYMPH NODE (1/1). 3. Lymph node, biopsy, Level 12 #2 - METASTATIC CARCINOMA IN 1 OF 1 LYMPH NODE (1/1). 4. Lymph node, biopsy, Level 7 - METASTATIC CARCINOMA IN 1 OF 1 LYMPH NODE (1/1/). 5. Lymph node, biopsy, Level 7 #2 - METASTATIC CARCINOMA IN 1 OF 1 LYMPH NODE (1/1). 6. Lymph node, biopsy, Level 7 #3 - METASTATIC CARCINOMA IN 1 OF 1 LYMPH NODE (1/1). 7. Lymph node, biopsy, Level 7 #4 - METASTATIC CARCINOMA IN 1 OF 1 LYMPH NODE (1/1). 8. Lymph node, biopsy, 4R - THERE IS NO EVIDENCE OF CARCINOMA IN 1 OF 1 LYMPH NODE (0/1). 9. Lymph node, biopsy, 4R #2 - THERE IS NO EVIDENCE OF CARCINOMA IN 1 OF 1 LYMPH NODE (0/1).      01/05/2016 Pathology Results    PDL1 NEGATIVE- tumor proportion  score of 0%.       01/05/2016 Pathology Results    Genomic alterations identified: BRAF V600E, KIT amplification, PDGFRA amplification, CDKN2A/B loss, TP53 S90fs*33.  Additional findings: MSI-STABLE.  No reportable alterations identified: EGFR, KRAS, ALK, MET, RET, ERBB2, ROS1      02/09/2016 Procedure    Port placed by IR.      02/16/2016 - 05/31/2016 Chemotherapy    Carboplatin/Pemetrexed x 6 cycles        - 08/01/2016 Radiation Therapy    [place]      08/28/2016 Imaging    CT CAP- No evidence of recurrent or metastatic carcinoma within the chest, abdomen, or pelvis.  Stable incidental findings include a absent, small benign right adrenal adenoma, sigmoid diverticulosis, and aortic atherosclerosis.      09/04/2016 -  Chemotherapy    Imfinzi immunotherapy up to 52 weeks.       12/07/2016 Imaging    CT C/A/P: New focal streaky opacities within the peripheral posterior right lower lobe and anteromedial left upper lobe- likely atelectasis with infection or other process is less likely. Recommend attention to these areas on future scans.  Small right pleural effusion, slightly increased from 08/28/2016. No evidence of pleural mass or enhancement.  No evidence of malignancy/ metastatic disease within the abdomen or pelvis.       02/12/2017 Imaging    CT chest: IMPRESSION: 1. Stable CT of the chest.   No specific findings identified to suggest residual or recurrence of tumor. 2. Persistent right pleural effusion. 3. Paramediastinal radiation change predominantly involving the right lung is similar to previous study. 4. Stable right adrenal gland nodule and low-density foci within the liver. 5. Aortic Atherosclerosis (ICD10-I70.0) and Emphysema (ICD10-J43.9). Multi vessel coronary artery calcifications noted.         INTERVAL HISTORY:  [name] 83 y.o. male returns for follow-up for lung cancer.   He is due  today for next cycle 16 of maintenance Imfinzi.  He  is here today with his wife.  Overall, he tells me that he has been feeling well, with no new health issues since his last visit. He continues to have chronic fatigue. Energy level is at 75% of his baseline.  He continues to have some dizziness, which has been going on for year. Continues to have some peripheral neuropathy. Also reports some easy bruising/bleeding.  Denies any active bleeding including blood in his stools, hematuria, nosebleeds, or gingival bleeding.  Denies rash or pruritis. No pain today. No recent falls. He has been tolerating imfinzi well with minimal side effects. He denies any worsening shortness of breath or diarrhea.   REVIEW OF SYSTEMS:  Review of Systems  Constitutional: Positive for fatigue. Negative for chills and fever.  HENT:  Negative.  Negative for lump/mass and nosebleeds.   Eyes: Negative.   Respiratory: Negative.  Negative for cough and shortness of breath.   Cardiovascular: Negative.  Negative for chest pain and leg swelling.  Gastrointestinal: Negative for abdominal pain, blood in stool, constipation, diarrhea, nausea and vomiting.  Endocrine: Negative.   Genitourinary: Negative.  Negative for dysuria and hematuria.   Musculoskeletal: Negative for arthralgias.  Skin: Negative.  Negative for rash.  Neurological: Positive for dizziness (chronic) and numbness. Negative for extremity weakness and headaches.  Hematological: Negative for adenopathy. Bruises/bleeds easily.  Psychiatric/Behavioral: Negative.  Negative for depression and sleep disturbance. The patient is not nervous/anxious.      PAST MEDICAL/SURGICAL HISTORY:  Past Medical History:  Diagnosis Date  . Adenocarcinoma of lung, stage 3 (HCC)    Stage IIIA  . Adenocarcinoma of right lung, stage 3 ([facility]) 12/02/2015  . Arthritis   . Atrial fibrillation, transient ([facility])    transient postop, < 24 hours  . B12 deficiency   . Cyst of scrotum   . Diverticulitis   . GERD (gastroesophageal reflux  disease)   . Hernia   . History of pneumonia   . Hyperlipidemia   . Lung nodule    Spiculated right middle lobe nodule, hypermetabolic  . Type 2 diabetes mellitus ([facility])    Past Surgical History:  Procedure Laterality Date  . BACK SURGERY    . CHOLECYSTECTOMY    . COLONOSCOPY N/A 11/24/2015   Procedure: COLONOSCOPY;  Surgeon: Rogene Houston, MD;  Location: [facility];  Service: Endoscopy;  Laterality: N/A;  730  . COLONOSCOPY W/ POLYPECTOMY     x 5  . CYST EXCISION     Scrotum  . EYE SURGERY Bilateral    Cataract with Lens  . HERNIA REPAIR Right    Inguinal  . IR GENERIC HISTORICAL  02/09/2016   IR US GUIDE VASC ACCESS RIGHT 02/09/2016 Arne Cleveland, MD [facility]  . IR GENERIC HISTORICAL  02/09/2016   IR FLUORO GUIDE CV LINE RIGHT 02/09/2016 Arne Cleveland, MD [facility]  . LUMBAR DISC SURGERY     per patient report  . VIDEO ASSISTED THORACOSCOPY (VATS)/ LOBECTOMY  Right 12/30/2015   Procedure: VIDEO ASSISTED THORACOSCOPY (VATS)/RIGHT MIDDLE LOBECTOMY;  Surgeon: Melrose Nakayama, MD;  Location: [facility];  Service: Thoracic;  Laterality: Right;     SOCIAL HISTORY:  Social History   Social History  . Marital status: Married    Spouse name: N/A  . Number of children: N/A  . Years of education: N/A   Occupational History  . Not on file.   Social History Main Topics  . Smoking status: Former Smoker    Types: Cigarettes    Quit date: 05/27/2004  . Smokeless tobacco: Never Used  . Alcohol use No  . Drug use: No  . Sexual activity: Not on file     Comment: married   Other Topics Concern  . Not on file   Social History Narrative  . No narrative on file    FAMILY HISTORY:  Family History  Problem Relation Age of Onset  . Colon cancer Brother     CURRENT MEDICATIONS:  Outpatient Encounter Prescriptions as of 04/09/2017  Medication Sig  . ALPRAZolam (XANAX) 0.5 MG tablet Take 0.5 mg by mouth daily.  Marland Kitchen aspirin EC 81 MG tablet Take 81 mg by mouth daily.    . calcium carbonate (OS-CAL - DOSED IN MG OF ELEMENTAL CALCIUM) 1250 (500 Ca) MG tablet Take 1 tablet (500 mg of elemental calcium total) by mouth daily with breakfast.  . Cholecalciferol (VITAMIN D) 2000 UNITS CAPS Take 1 capsule by mouth daily.   . Cyanocobalamin (VITAMIN B-12 IJ) Inject 1 Dose as directed every 30 (thirty) days.  Hunt Oris (IMFINZI IV) Inject into the vein.  Marland Kitchen glipiZIDE (GLUCOTROL) 5 MG tablet Take 1 tablet (5 mg total) by mouth 2 (two) times daily before a meal. (Patient taking differently: Take 2.5 mg by mouth 2 (two) times daily before a meal. )  . lidocaine-prilocaine (EMLA) cream Apply a quarter size amount to port site 1 hour prior to chemo. Do not rub in. Cover with plastic wrap.  . lisinopril (PRINIVIL,ZESTRIL) 40 MG tablet Take 1 tablet (40 mg total) by mouth daily.  Marland Kitchen loratadine (CLARITIN) 10 MG tablet Take 10 mg by mouth daily.  . meclizine (ANTIVERT) 25 MG tablet Take 25 mg by mouth 2 (two) times daily.   . ondansetron (ZOFRAN) 8 MG tablet Take 1 tablet (8 mg total) by mouth every 8 (eight) hours as needed for nausea or vomiting.  . pantoprazole (PROTONIX) 40 MG tablet Take 40 mg by mouth daily.   . prochlorperazine (COMPAZINE) 10 MG tablet Take 1 tablet (10 mg total) by mouth every 6 (six) hours as needed for nausea or vomiting.  . prochlorperazine (COMPAZINE) 10 MG tablet TAKE ONE TABLET BY MOUTH EVERY 6 HOURS AS NEEDED FOR NAUSEA AND VOMITING  . simvastatin (ZOCOR) 80 MG tablet Take 40 mg by mouth at bedtime.   Marland Kitchen terazosin (HYTRIN) 5 MG capsule Take 5 mg by mouth at bedtime.  Marland Kitchen tiZANidine (ZANAFLEX) 4 MG tablet Take 4 mg by mouth at bedtime.   No facility-administered encounter medications on file as of 04/09/2017.     ALLERGIES:  Allergies  Allergen Reactions  . Morphine And Related Other (See Comments)    confusion     PHYSICAL EXAM:  ECOG Performance status: 1 - Symptomatic; remains largely independent (ambulates with walker)  Vitals:    04/09/17 0923  BP: (!) 112/46  Pulse: 89  Resp: 18  Temp: 97.8 F (36.6 C)  SpO2: 94%    Physical Exam  Constitutional: He is oriented to person, place, and time and well-developed, well-nourished, and in no distress.  HENT:  Head: Normocephalic.  Mouth/Throat: Oropharynx is clear and moist. No oropharyngeal exudate.  Eyes: Pupils are equal, round, and reactive to light. Conjunctivae are normal. No scleral icterus.  Neck: Normal range of motion. Neck supple.  Cardiovascular: Normal rate and regular rhythm.   Pulmonary/Chest: Effort normal. No respiratory distress.     Abdominal: Soft. Bowel sounds are normal. There is no tenderness.  Musculoskeletal: Normal range of motion. He exhibits no edema.  Lymphadenopathy:    He has no cervical adenopathy.  Neurological: He is alert and oriented to person, place, and time. No cranial nerve deficit.  Ambulating with walker   Skin: Skin is warm and dry.  Psychiatric: Mood, memory, affect and judgment normal.  Nursing note and vitals reviewed.    LABORATORY DATA:  I have reviewed the labs as listed.  CBC    Component Value Date/Time   WBC 7.9 04/09/2017 0844   RBC 3.94 (L) 04/09/2017 0844   HGB 11.5 (L) 04/09/2017 0844   HCT 34.3 (L) 04/09/2017 0844   PLT 210 04/09/2017 0844   MCV 87.1 04/09/2017 0844   MCH 29.2 04/09/2017 0844   MCHC 33.5 04/09/2017 0844   RDW 13.1 04/09/2017 0844   LYMPHSABS 1.7 04/09/2017 0844   MONOABS 0.6 04/09/2017 0844   EOSABS 0.6 04/09/2017 0844   BASOSABS 0.0 04/09/2017 0844   CMP Latest Ref Rng & Units 04/09/2017 03/26/2017 03/05/2017  Glucose 65 - 99 mg/dL 217(H) 179(H) 116(H)  BUN 6 - 20 mg/dL '10 12 14  '$ Creatinine 0.61 - 1.24 mg/dL 0.97 1.05 1.11  Sodium 135 - 145 mmol/L 129(L) 129(L) 132(L)  Potassium 3.5 - 5.1 mmol/L 3.9 3.9 3.8  Chloride 101 - 111 mmol/L 97(L) 97(L) 99(L)  CO2 22 - 32 mmol/L '25 23 25  '$ Calcium 8.9 - 10.3 mg/dL 8.9 8.9 9.1  Total Protein 6.5 - 8.1 g/dL 6.1(L) 6.3(L) 6.6    Total Bilirubin 0.3 - 1.2 mg/dL 0.5 0.8 0.4  Alkaline Phos 38 - 126 U/L 65 64 63  AST 15 - 41 U/L '27 28 27  '$ ALT 17 - 63 U/L '22 24 20    '$ PENDING LABS:    DIAGNOSTIC IMAGING:  *The following radiologic images and reports have been reviewed independently and agree with below findings.  CT chest/abd/pelvis: 12/07/16      PATHOLOGY:  Lung resection surgical path: 12/30/15         ASSESSMENT & PLAN:   Stage IIIA adenocarcinoma of right lung:  -Diagnosed in 11/2015. Treated with RML lobectomy/VATS with mediastinal lymph node dissection. PDL-1 negative. Underwent chemotherapy with Carbo/Alimta x 6 cycles, completing chemo on 05/31/16. Also had chest radiation in [place] which completed on 08/01/16.  Maintenance Imfinzi initiated on 09/04/16 with plans to continue for total of 1 year.  -CT chest/abd/pelvis on 02/12/17 reviewed in detail with patient and wife today, there is no evidence of recurrent or metastatic disease -Labs reviewed. Due for Imfinzi today and will proceed with treatment as scheduled.  -Continue Imfinzi every 2 weeks as scheduled. He is tolerating treatments very well.  -Plan to restage with CT C/A/P in 3 months in January 2019. -Return to cancer [name] in ~6 weeks for follow up   Hyponatremia:  -Stable. Na 129 today. Asymptomatic. -Continue fluid restrition to <1 liter daily. May need to put him on salt tablets if the sodium continues to decrease. Fall prevention:   -Strongly  recommended he continue using his walker and/or cane to ambulate.   Vitamin B12 deficiency:  -Continue monthly vitamin B12 injections.      Dispo:  -Continue Imfinzi every 2 weeks.  -Return to cancer [name] in 6 weeks for follow-up.    All questions were answered to patient's stated satisfaction. Encouraged patient to call with any new concerns or questions before his next visit to the cancer [name] and we can certain see him sooner, if needed.    Twana First, MD

## 2017-04-09 NOTE — Progress Notes (Signed)
Tolerated infusion w/o adverse reaction.  Alert, in no distress.  VSS.  Discharged ambulatory in c/o spouse.  

## 2017-04-23 ENCOUNTER — Encounter (HOSPITAL_COMMUNITY): Payer: Medicare Other | Attending: Hematology & Oncology

## 2017-04-23 ENCOUNTER — Encounter (HOSPITAL_COMMUNITY): Payer: Self-pay

## 2017-04-23 VITALS — BP 120/54 | HR 83 | Temp 97.5°F | Resp 18 | Wt 207.8 lb

## 2017-04-23 DIAGNOSIS — C3491 Malignant neoplasm of unspecified part of right bronchus or lung: Secondary | ICD-10-CM

## 2017-04-23 DIAGNOSIS — C342 Malignant neoplasm of middle lobe, bronchus or lung: Secondary | ICD-10-CM | POA: Diagnosis not present

## 2017-04-23 DIAGNOSIS — R918 Other nonspecific abnormal finding of lung field: Secondary | ICD-10-CM | POA: Insufficient documentation

## 2017-04-23 DIAGNOSIS — D649 Anemia, unspecified: Secondary | ICD-10-CM | POA: Insufficient documentation

## 2017-04-23 DIAGNOSIS — E538 Deficiency of other specified B group vitamins: Secondary | ICD-10-CM | POA: Diagnosis present

## 2017-04-23 DIAGNOSIS — Z5112 Encounter for antineoplastic immunotherapy: Secondary | ICD-10-CM

## 2017-04-23 LAB — CBC WITH DIFFERENTIAL/PLATELET
Basophils Absolute: 0 10*3/uL (ref 0.0–0.1)
Basophils Relative: 1 %
Eosinophils Absolute: 0.8 10*3/uL — ABNORMAL HIGH (ref 0.0–0.7)
Eosinophils Relative: 10 %
HCT: 33.9 % — ABNORMAL LOW (ref 39.0–52.0)
Hemoglobin: 11.5 g/dL — ABNORMAL LOW (ref 13.0–17.0)
Lymphocytes Relative: 19 %
Lymphs Abs: 1.6 10*3/uL (ref 0.7–4.0)
MCH: 29.9 pg (ref 26.0–34.0)
MCHC: 33.9 g/dL (ref 30.0–36.0)
MCV: 88.1 fL (ref 78.0–100.0)
Monocytes Absolute: 0.9 10*3/uL (ref 0.1–1.0)
Monocytes Relative: 10 %
Neutro Abs: 5.2 10*3/uL (ref 1.7–7.7)
Neutrophils Relative %: 60 %
Platelets: 202 10*3/uL (ref 150–400)
RBC: 3.85 MIL/uL — ABNORMAL LOW (ref 4.22–5.81)
RDW: 13.4 % (ref 11.5–15.5)
WBC: 8.6 10*3/uL (ref 4.0–10.5)

## 2017-04-23 LAB — COMPREHENSIVE METABOLIC PANEL
ALT: 23 U/L (ref 17–63)
AST: 27 U/L (ref 15–41)
Albumin: 3.3 g/dL — ABNORMAL LOW (ref 3.5–5.0)
Alkaline Phosphatase: 62 U/L (ref 38–126)
Anion gap: 7 (ref 5–15)
BUN: 16 mg/dL (ref 6–20)
CO2: 26 mmol/L (ref 22–32)
Calcium: 9 mg/dL (ref 8.9–10.3)
Chloride: 100 mmol/L — ABNORMAL LOW (ref 101–111)
Creatinine, Ser: 1.15 mg/dL (ref 0.61–1.24)
GFR calc Af Amer: >60 mL/min (ref >60)
GFR calc non Af Amer: 57 mL/min — ABNORMAL LOW (ref >60)
Glucose, Bld: 179 mg/dL — ABNORMAL HIGH (ref 65–99)
Potassium: 3.5 mmol/L (ref 3.5–5.1)
Sodium: 133 mmol/L — ABNORMAL LOW (ref 135–145)
Total Bilirubin: 0.6 mg/dL (ref 0.3–1.2)
Total Protein: 6.1 g/dL — ABNORMAL LOW (ref 6.5–8.1)

## 2017-04-23 MED ORDER — SODIUM CHLORIDE 0.9 % IV SOLN
Freq: Once | INTRAVENOUS | Status: AC
  Administered 2017-04-23: 12:00:00 via INTRAVENOUS

## 2017-04-23 MED ORDER — SODIUM CHLORIDE 0.9 % IV SOLN
10.5000 mg/kg | Freq: Once | INTRAVENOUS | Status: AC
  Administered 2017-04-23: 980 mg via INTRAVENOUS
  Filled 2017-04-23: qty 10

## 2017-04-23 MED ORDER — HEPARIN SOD (PORK) LOCK FLUSH 100 UNIT/ML IV SOLN
500.0000 [IU] | Freq: Once | INTRAVENOUS | Status: AC | PRN
  Administered 2017-04-23: 500 [IU]

## 2017-04-23 MED ORDER — SODIUM CHLORIDE 0.9% FLUSH
10.0000 mL | INTRAVENOUS | Status: DC | PRN
  Administered 2017-04-23: 10 mL
  Filled 2017-04-23: qty 10

## 2017-04-23 NOTE — Progress Notes (Signed)
Labs reviewed with MD, proceed with treatment.     Treatment given per orders. Patient tolerated it well without problems. Vitals stable and discharged home from clinic ambulatory. Follow up as scheduled.

## 2017-05-07 ENCOUNTER — Encounter (HOSPITAL_BASED_OUTPATIENT_CLINIC_OR_DEPARTMENT_OTHER): Payer: Medicare Other

## 2017-05-07 VITALS — BP 121/60 | HR 70 | Temp 97.8°F | Resp 18 | Wt 206.5 lb

## 2017-05-07 DIAGNOSIS — Z5112 Encounter for antineoplastic immunotherapy: Secondary | ICD-10-CM | POA: Diagnosis present

## 2017-05-07 DIAGNOSIS — C342 Malignant neoplasm of middle lobe, bronchus or lung: Secondary | ICD-10-CM | POA: Diagnosis not present

## 2017-05-07 DIAGNOSIS — C3491 Malignant neoplasm of unspecified part of right bronchus or lung: Secondary | ICD-10-CM

## 2017-05-07 DIAGNOSIS — E538 Deficiency of other specified B group vitamins: Secondary | ICD-10-CM

## 2017-05-07 DIAGNOSIS — R918 Other nonspecific abnormal finding of lung field: Secondary | ICD-10-CM | POA: Diagnosis not present

## 2017-05-07 LAB — CBC WITH DIFFERENTIAL/PLATELET
Basophils Absolute: 0.1 10*3/uL (ref 0.0–0.1)
Basophils Relative: 1 %
Eosinophils Absolute: 1.2 10*3/uL — ABNORMAL HIGH (ref 0.0–0.7)
Eosinophils Relative: 14 %
HCT: 35.1 % — ABNORMAL LOW (ref 39.0–52.0)
Hemoglobin: 11.5 g/dL — ABNORMAL LOW (ref 13.0–17.0)
Lymphocytes Relative: 20 %
Lymphs Abs: 1.7 10*3/uL (ref 0.7–4.0)
MCH: 29.6 pg (ref 26.0–34.0)
MCHC: 32.8 g/dL (ref 30.0–36.0)
MCV: 90.2 fL (ref 78.0–100.0)
Monocytes Absolute: 0.7 10*3/uL (ref 0.1–1.0)
Monocytes Relative: 8 %
Neutro Abs: 5 10*3/uL (ref 1.7–7.7)
Neutrophils Relative %: 57 %
Platelets: 212 10*3/uL (ref 150–400)
RBC: 3.89 MIL/uL — ABNORMAL LOW (ref 4.22–5.81)
RDW: 13.4 % (ref 11.5–15.5)
WBC: 8.7 10*3/uL (ref 4.0–10.5)

## 2017-05-07 LAB — COMPREHENSIVE METABOLIC PANEL
ALT: 20 U/L (ref 17–63)
AST: 27 U/L (ref 15–41)
Albumin: 3.4 g/dL — ABNORMAL LOW (ref 3.5–5.0)
Alkaline Phosphatase: 67 U/L (ref 38–126)
Anion gap: 8 (ref 5–15)
BUN: 18 mg/dL (ref 6–20)
CO2: 25 mmol/L (ref 22–32)
Calcium: 8.8 mg/dL — ABNORMAL LOW (ref 8.9–10.3)
Chloride: 100 mmol/L — ABNORMAL LOW (ref 101–111)
Creatinine, Ser: 1.11 mg/dL (ref 0.61–1.24)
GFR calc Af Amer: >60 mL/min (ref >60)
GFR calc non Af Amer: 59 mL/min — ABNORMAL LOW (ref >60)
Glucose, Bld: 125 mg/dL — ABNORMAL HIGH (ref 65–99)
Potassium: 3.5 mmol/L (ref 3.5–5.1)
Sodium: 133 mmol/L — ABNORMAL LOW (ref 135–145)
Total Bilirubin: 0.5 mg/dL (ref 0.3–1.2)
Total Protein: 6.2 g/dL — ABNORMAL LOW (ref 6.5–8.1)

## 2017-05-07 LAB — TSH: TSH: 5.517 u[IU]/mL — ABNORMAL HIGH (ref 0.350–4.500)

## 2017-05-07 MED ORDER — CYANOCOBALAMIN 1000 MCG/ML IJ SOLN
1000.0000 ug | Freq: Once | INTRAMUSCULAR | Status: AC
  Administered 2017-05-07: 1000 ug via INTRAMUSCULAR

## 2017-05-07 MED ORDER — CYANOCOBALAMIN 1000 MCG/ML IJ SOLN
INTRAMUSCULAR | Status: AC
  Filled 2017-05-07: qty 1

## 2017-05-07 MED ORDER — HEPARIN SOD (PORK) LOCK FLUSH 100 UNIT/ML IV SOLN
500.0000 [IU] | Freq: Once | INTRAVENOUS | Status: AC | PRN
  Administered 2017-05-07: 500 [IU]
  Filled 2017-05-07: qty 5

## 2017-05-07 MED ORDER — SODIUM CHLORIDE 0.9 % IV SOLN
Freq: Once | INTRAVENOUS | Status: AC
  Administered 2017-05-07: 11:00:00 via INTRAVENOUS

## 2017-05-07 MED ORDER — SODIUM CHLORIDE 0.9 % IV SOLN
10.5000 mg/kg | Freq: Once | INTRAVENOUS | Status: AC
  Administered 2017-05-07: 980 mg via INTRAVENOUS
  Filled 2017-05-07: qty 9.6

## 2017-05-07 NOTE — Progress Notes (Signed)
Tolerated infusion w/o adverse reaction.  Alert, in no distress.  VSS.  Discharged ambulatory in c/o spouse.  

## 2017-05-09 ENCOUNTER — Other Ambulatory Visit (HOSPITAL_COMMUNITY): Payer: Self-pay | Admitting: Adult Health

## 2017-05-09 DIAGNOSIS — R7989 Other specified abnormal findings of blood chemistry: Secondary | ICD-10-CM

## 2017-05-21 ENCOUNTER — Encounter (HOSPITAL_COMMUNITY): Payer: Medicare Other | Attending: Hematology & Oncology

## 2017-05-21 ENCOUNTER — Encounter (HOSPITAL_COMMUNITY): Payer: Self-pay

## 2017-05-21 ENCOUNTER — Ambulatory Visit (HOSPITAL_COMMUNITY): Payer: Medicare Other

## 2017-05-21 ENCOUNTER — Other Ambulatory Visit: Payer: Self-pay

## 2017-05-21 ENCOUNTER — Encounter (HOSPITAL_BASED_OUTPATIENT_CLINIC_OR_DEPARTMENT_OTHER): Payer: Medicare Other | Admitting: Oncology

## 2017-05-21 VITALS — BP 121/57 | HR 81 | Temp 97.5°F | Resp 18 | Wt 212.6 lb

## 2017-05-21 DIAGNOSIS — D649 Anemia, unspecified: Secondary | ICD-10-CM | POA: Insufficient documentation

## 2017-05-21 DIAGNOSIS — C342 Malignant neoplasm of middle lobe, bronchus or lung: Secondary | ICD-10-CM | POA: Diagnosis present

## 2017-05-21 DIAGNOSIS — E871 Hypo-osmolality and hyponatremia: Secondary | ICD-10-CM | POA: Diagnosis not present

## 2017-05-21 DIAGNOSIS — C3491 Malignant neoplasm of unspecified part of right bronchus or lung: Secondary | ICD-10-CM

## 2017-05-21 DIAGNOSIS — Z5112 Encounter for antineoplastic immunotherapy: Secondary | ICD-10-CM | POA: Diagnosis present

## 2017-05-21 DIAGNOSIS — E538 Deficiency of other specified B group vitamins: Secondary | ICD-10-CM | POA: Diagnosis present

## 2017-05-21 DIAGNOSIS — R918 Other nonspecific abnormal finding of lung field: Secondary | ICD-10-CM | POA: Diagnosis not present

## 2017-05-21 DIAGNOSIS — R7989 Other specified abnormal findings of blood chemistry: Secondary | ICD-10-CM

## 2017-05-21 LAB — CBC WITH DIFFERENTIAL/PLATELET
Basophils Absolute: 0.1 10*3/uL (ref 0.0–0.1)
Basophils Relative: 1 %
Eosinophils Absolute: 1.8 10*3/uL — ABNORMAL HIGH (ref 0.0–0.7)
Eosinophils Relative: 19 %
HCT: 34.4 % — ABNORMAL LOW (ref 39.0–52.0)
Hemoglobin: 11.2 g/dL — ABNORMAL LOW (ref 13.0–17.0)
Lymphocytes Relative: 20 %
Lymphs Abs: 1.8 10*3/uL (ref 0.7–4.0)
MCH: 29.6 pg (ref 26.0–34.0)
MCHC: 32.6 g/dL (ref 30.0–36.0)
MCV: 90.8 fL (ref 78.0–100.0)
Monocytes Absolute: 0.9 10*3/uL (ref 0.1–1.0)
Monocytes Relative: 10 %
Neutro Abs: 4.7 10*3/uL (ref 1.7–7.7)
Neutrophils Relative %: 50 %
Platelets: 212 10*3/uL (ref 150–400)
RBC: 3.79 MIL/uL — ABNORMAL LOW (ref 4.22–5.81)
RDW: 13.6 % (ref 11.5–15.5)
WBC: 9.2 10*3/uL (ref 4.0–10.5)

## 2017-05-21 LAB — COMPREHENSIVE METABOLIC PANEL
ALT: 18 U/L (ref 17–63)
AST: 26 U/L (ref 15–41)
Albumin: 3.1 g/dL — ABNORMAL LOW (ref 3.5–5.0)
Alkaline Phosphatase: 70 U/L (ref 38–126)
Anion gap: 8 (ref 5–15)
BUN: 14 mg/dL (ref 6–20)
CO2: 28 mmol/L (ref 22–32)
Calcium: 8.4 mg/dL — ABNORMAL LOW (ref 8.9–10.3)
Chloride: 99 mmol/L — ABNORMAL LOW (ref 101–111)
Creatinine, Ser: 1.24 mg/dL (ref 0.61–1.24)
GFR calc Af Amer: 60 mL/min — ABNORMAL LOW (ref >60)
GFR calc non Af Amer: 52 mL/min — ABNORMAL LOW (ref >60)
Glucose, Bld: 194 mg/dL — ABNORMAL HIGH (ref 65–99)
Potassium: 3.2 mmol/L — ABNORMAL LOW (ref 3.5–5.1)
Sodium: 135 mmol/L (ref 135–145)
Total Bilirubin: 0.5 mg/dL (ref 0.3–1.2)
Total Protein: 6 g/dL — ABNORMAL LOW (ref 6.5–8.1)

## 2017-05-21 LAB — TSH: TSH: 6.968 u[IU]/mL — ABNORMAL HIGH (ref 0.350–4.500)

## 2017-05-21 LAB — T4, FREE: Free T4: 0.92 ng/dL (ref 0.61–1.12)

## 2017-05-21 MED ORDER — SODIUM CHLORIDE 0.9 % IV SOLN
980.0000 mg | Freq: Once | INTRAVENOUS | Status: AC
  Administered 2017-05-21: 980 mg via INTRAVENOUS
  Filled 2017-05-21: qty 10

## 2017-05-21 MED ORDER — POTASSIUM CHLORIDE CRYS ER 20 MEQ PO TBCR
40.0000 meq | EXTENDED_RELEASE_TABLET | Freq: Once | ORAL | Status: AC
  Administered 2017-05-21: 40 meq via ORAL
  Filled 2017-05-21: qty 2

## 2017-05-21 MED ORDER — SODIUM CHLORIDE 0.9 % IV SOLN
Freq: Once | INTRAVENOUS | Status: AC
  Administered 2017-05-21: 11:00:00 via INTRAVENOUS

## 2017-05-21 MED ORDER — SODIUM CHLORIDE 0.9% FLUSH
10.0000 mL | INTRAVENOUS | Status: DC | PRN
  Administered 2017-05-21: 10 mL
  Filled 2017-05-21: qty 10

## 2017-05-21 MED ORDER — HEPARIN SOD (PORK) LOCK FLUSH 100 UNIT/ML IV SOLN
500.0000 [IU] | Freq: Once | INTRAVENOUS | Status: AC | PRN
  Administered 2017-05-21: 500 [IU]
  Filled 2017-05-21: qty 5

## 2017-05-21 NOTE — Patient Instructions (Signed)
[facility] Discharge Instructions for Patients Receiving Chemotherapy   Beginning January 23rd 2017 lab work for the [facility] will be done in the  Main lab at [facility] on 1st floor. If you have a lab appointment with the [facility] please come in thru the  Main Entrance and check in at the main information desk   Today you received the following chemotherapy agents Imfinzi. Follow-up as scheduled. Call clinic for any questions or concerns  To help prevent nausea and vomiting after your treatment, we encourage you to take your nausea medication   If you develop nausea and vomiting, or diarrhea that is not controlled by your medication, call the clinic.  The clinic phone number is [phone]. Office hours are Monday-Friday 8:30am-5:00pm.  BELOW ARE SYMPTOMS THAT SHOULD BE REPORTED IMMEDIATELY:  *FEVER GREATER THAN 101.0 F  *CHILLS WITH OR WITHOUT FEVER  NAUSEA AND VOMITING THAT IS NOT CONTROLLED WITH YOUR NAUSEA MEDICATION  *UNUSUAL SHORTNESS OF BREATH  *UNUSUAL BRUISING OR BLEEDING  TENDERNESS IN MOUTH AND THROAT WITH OR WITHOUT PRESENCE OF ULCERS  *URINARY PROBLEMS  *BOWEL PROBLEMS  UNUSUAL RASH Items with * indicate a potential emergency and should be followed up as soon as possible. If you have an emergency after office hours please contact your primary care physician or go to the nearest emergency department.  Please call the clinic during office hours if you have any questions or concerns.   You may also contact the Patient Navigator at [phone] should you have any questions or need assistance in obtaining follow up care.      Resources For Cancer Patients and their Caregivers ? American Cancer Society: Can assist with transportation, wigs, general needs, runs Look Good Feel Better.        [phone] ? Cancer Care: Provides financial assistance, online support groups, medication/co-pay assistance.  1-800-813-HOPE  305-562-7817) ? [place] Assists [place] cancer patients and their families through emotional , educational and financial support.  6146714500 ? [place] DSS Where to apply for food stamps, Medicaid and utility assistance. 651-561-3409 ? [employer]: Transportation to medical appointments. ([phone] 596-9177 ? Social Security Administration: May apply for disability if have a Stage IV cancer. [phone] 231-377-2845 ? [employer], Disability and Transit Services: Assists with nutrition, care and transit needs. [phone]

## 2017-05-21 NOTE — Patient Instructions (Signed)
[facility] Discharge Instructions  RECOMMENDATIONS MADE BY THE CONSULTANT AND ANY TEST RESULTS WILL BE SENT TO YOUR REFERRING PHYSICIAN.  You were seen today by Dr. Louise Zhou    Thank you for choosing [facility] to provide your oncology and hematology care.  To afford each patient quality time with our provider, please arrive at least 15 minutes before your scheduled appointment time.    If you have a lab appointment with the Cancer Center please come in thru the  Main Entrance and check in at the main information desk  You need to re-schedule your appointment should you arrive 10 or more minutes late.  We strive to give you quality time with our providers, and arriving late affects you and other patients whose appointments are after yours.  Also, if you no show three or more times for appointments you may be dismissed from the clinic at the providers discretion.     Again, thank you for choosing [facility].  Our hope is that these requests will decrease the amount of time that you wait before being seen by our physicians.       _____________________________________________________________  Should you have questions after your visit to [facility], please contact our office at [phone] between the hours of 8:30 a.m. and 4:30 p.m.  Voicemails left after 4:30 p.m. will not be returned until the following business day.  For prescription refill requests, have your pharmacy contact our office.       Resources For Cancer Patients and their Caregivers ? American Cancer Society: Can assist with transportation, wigs, general needs, runs Look Good Feel Better.        1-888-227-6333 ? Cancer Care: Provides financial assistance, online support groups, medication/co-pay assistance.  1-800-813-HOPE (4673) ? [facility] Assists [place] cancer patients and their  families through emotional , educational and financial support.  336-427-4357 ? [place] DSS Where to apply for food stamps, Medicaid and utility assistance. 336-342-1394 ? [facility]: Transportation to medical appointments. 336-347-2287 ? Social Security Administration: May apply for disability if have a Stage IV cancer. 336-342-7796 1-800-772-1213 ? [place] Aging, Disability and Transit Services: Assists with nutrition, care and transit needs. 336-349-2343  Cancer Center Support Programs: @10RELATIVEDAYS@ > Cancer Support Group  2nd Tuesday of the month 1pm-2pm, Journey Room  > Creative Journey  3rd Tuesday of the month 1130am-1pm, Journey Room  > Look Good Feel Better  1st Wednesday of the month 10am-12 noon, Journey Room (Call American Cancer Society to register 1-800-395-5775)    

## 2017-05-21 NOTE — Progress Notes (Signed)
  [facility] [address] [place], [place] [address]   CLINIC:  Medical Oncology/Hematology  PCP:  Shah, Ashish, MD [address] [place] [address] [phone]   REASON FOR VISIT:  Follow-up for Stage IIIA adenocarcinoma of right lung AND vitamin B12 deficiency  CURRENT THERAPY: Maintenance Imfinzi, beginning 09/04/16 AND vitamin B12 injection monthly   BRIEF ONCOLOGIC HISTORY:    Adenocarcinoma of right lung, stage 3 (HCC)   11/10/2015 Imaging    13 mm spiculated lesion seen in right middle lobe.      11/30/2015 PET scan    Spiculated right middle lobe nodule is mildly hypermetabolic and most consistent with a stage IA adenocarcinoma.      12/30/2015 Surgery    Right middle lobectomy and node dissection      12/30/2015 Procedure    Right video-assisted thoracoscopy, Thoracoscopic right middle lobectomy, Mediastinal lymph node dissection, and On-Q local anesthetic catheter placement.      01/02/2016 Pathology Results    1. Lung, resection (segmental or lobe), Right Middle Lobe - INVASIVE ADENOCARCINOMA, WELL DIFFERENTIATED, SPANNING 1.2 CM. - THE SURGICAL RESECTION MARGINS ARE NEGATIVE FOR CARCINOMA. 2. Lymph node, biopsy, Level 12 - METASTATIC CARCINOMA IN 1 OF 1 LYMPH NODE (1/1). 3. Lymph node, biopsy, Level 12 #2 - METASTATIC CARCINOMA IN 1 OF 1 LYMPH NODE (1/1). 4. Lymph node, biopsy, Level 7 - METASTATIC CARCINOMA IN 1 OF 1 LYMPH NODE (1/1/). 5. Lymph node, biopsy, Level 7 #2 - METASTATIC CARCINOMA IN 1 OF 1 LYMPH NODE (1/1). 6. Lymph node, biopsy, Level 7 #3 - METASTATIC CARCINOMA IN 1 OF 1 LYMPH NODE (1/1). 7. Lymph node, biopsy, Level 7 #4 - METASTATIC CARCINOMA IN 1 OF 1 LYMPH NODE (1/1). 8. Lymph node, biopsy, 4R - THERE IS NO EVIDENCE OF CARCINOMA IN 1 OF 1 LYMPH NODE (0/1). 9. Lymph node, biopsy, 4R #2 - THERE IS NO EVIDENCE OF CARCINOMA IN 1 OF 1 LYMPH NODE (0/1).      01/05/2016 Pathology Results    PDL1 NEGATIVE- tumor proportion  score of 0%.       01/05/2016 Pathology Results    Genomic alterations identified: BRAF V600E, KIT amplification, PDGFRA amplification, CDKN2A/B loss, TP53 S90fs*33.  Additional findings: MSI-STABLE.  No reportable alterations identified: EGFR, KRAS, ALK, MET, RET, ERBB2, ROS1      02/09/2016 Procedure    Port placed by IR.      02/16/2016 - 05/31/2016 Chemotherapy    Carboplatin/Pemetrexed x 6 cycles        - 08/01/2016 Radiation Therapy    [place], [place]      08/28/2016 Imaging    CT CAP- No evidence of recurrent or metastatic carcinoma within the chest, abdomen, or pelvis.  Stable incidental findings include a absent, small benign right adrenal adenoma, sigmoid diverticulosis, and aortic atherosclerosis.      09/04/2016 -  Chemotherapy    Imfinzi immunotherapy up to 52 weeks.       12/07/2016 Imaging    CT C/A/P: New focal streaky opacities within the peripheral posterior right lower lobe and anteromedial left upper lobe- likely atelectasis with infection or other process is less likely. Recommend attention to these areas on future scans.  Small right pleural effusion, slightly increased from 08/28/2016. No evidence of pleural mass or enhancement.  No evidence of malignancy/ metastatic disease within the abdomen or pelvis.       02/12/2017 Imaging    CT chest: IMPRESSION: 1. Stable CT of the chest.   No specific findings identified to suggest residual or recurrence of tumor. 2. Persistent right pleural effusion. 3. Paramediastinal radiation change predominantly involving the right lung is similar to previous study. 4. Stable right adrenal gland nodule and low-density foci within the liver. 5. Aortic Atherosclerosis (ICD10-I70.0) and Emphysema (ICD10-J43.9). Multi vessel coronary artery calcifications noted.         INTERVAL HISTORY:  [name] 83 y.o. male returns for follow-up for lung cancer.   He is due  today for next cycle 20 of maintenance Imfinzi.  He  is here today with his wife.  Patient is doing well. He is more tired than usual today due to waking up early this morning. He continues to have chronic fatigue. Energy level is at 75% of his baseline.  He continues to have some dizziness, which is chronic. He continues to have some peripheral neuropathy. Also reports some easy bruising/bleeding.  Denies any active bleeding including blood in his stools, hematuria, nosebleeds, or gingival bleeding.  Denies rash or pruritis. No pain today. He has been tolerating imfinzi well with minimal side effects. He denies any worsening shortness of breath, chest pain, nausea, vomiting, or diarrhea.   REVIEW OF SYSTEMS:  Review of Systems  Constitutional: Positive for fatigue. Negative for chills and fever.  HENT:  Negative.  Negative for lump/mass and nosebleeds.   Eyes: Negative.   Respiratory: Negative.  Negative for cough and shortness of breath.   Cardiovascular: Negative.  Negative for chest pain and leg swelling.  Gastrointestinal: Negative for abdominal pain, blood in stool, constipation, diarrhea, nausea and vomiting.  Endocrine: Negative.   Genitourinary: Negative.  Negative for dysuria and hematuria.   Musculoskeletal: Negative for arthralgias.  Skin: Negative.  Negative for rash.  Neurological: Positive for dizziness (chronic) and numbness. Negative for extremity weakness and headaches.  Hematological: Negative for adenopathy. Bruises/bleeds easily.  Psychiatric/Behavioral: Negative.  Negative for depression and sleep disturbance. The patient is not nervous/anxious.      PAST MEDICAL/SURGICAL HISTORY:  Past Medical History:  Diagnosis Date  . Adenocarcinoma of lung, stage 3 (HCC)    Stage IIIA  . Adenocarcinoma of right lung, stage 3 (HCC) 12/02/2015  . Arthritis   . Atrial fibrillation, transient (HCC)    transient postop, < 24 hours  . B12 deficiency   . Cyst of scrotum   . Diverticulitis   . GERD (gastroesophageal reflux disease)     . Hernia   . History of pneumonia   . Hyperlipidemia   . Lung nodule    Spiculated right middle lobe nodule, hypermetabolic  . Type 2 diabetes mellitus (HCC)    Past Surgical History:  Procedure Laterality Date  . BACK SURGERY    . CHOLECYSTECTOMY    . COLONOSCOPY N/A 11/24/2015   Procedure: COLONOSCOPY;  Surgeon: Najeeb U Rehman, MD;  Location: [facility];  Service: Endoscopy;  Laterality: N/A;  730  . COLONOSCOPY W/ POLYPECTOMY     x 5  . CYST EXCISION     Scrotum  . EYE SURGERY Bilateral    Cataract with Lens  . HERNIA REPAIR Right    Inguinal  . IR GENERIC HISTORICAL  02/09/2016   IR US GUIDE VASC ACCESS RIGHT 02/09/2016 Daniel Hassell, MD [facility]  . IR GENERIC HISTORICAL  02/09/2016   IR FLUORO GUIDE CV LINE RIGHT 02/09/2016 Daniel Hassell, MD [facility]  . LUMBAR DISC SURGERY     per patient report  . VIDEO ASSISTED THORACOSCOPY (VATS)/ LOBECTOMY Right 12/30/2015     Procedure: VIDEO ASSISTED THORACOSCOPY (VATS)/RIGHT MIDDLE LOBECTOMY;  Surgeon: Melrose Nakayama, MD;  [facility];  Service: Thoracic;  Laterality: Right;     SOCIAL HISTORY:  Social History   Socioeconomic History  . Marital status: Married    Spouse name: Not on file  . Number of children: Not on file  . Years of education: Not on file  . Highest education level: Not on file  Social Needs  . Financial resource strain: Not on file  . Food insecurity - worry: Not on file  . Food insecurity - inability: Not on file  . Transportation needs - medical: Not on file  . Transportation needs - non-medical: Not on file  Occupational History  . Not on file  Tobacco Use  . Smoking status: Former Smoker    Types: Cigarettes    Last attempt to quit: 05/27/2004    Years since quitting: 12.9  . Smokeless tobacco: Never Used  Substance and Sexual Activity  . Alcohol use: No    Alcohol/week: 0.0 oz  . Drug use: No  . Sexual activity: Not on file    Comment: married  Other Topics Concern   . Not on file  Social History Narrative  . Not on file    FAMILY HISTORY:  Family History  Problem Relation Age of Onset  . Colon cancer Brother     CURRENT MEDICATIONS:  Outpatient Encounter Medications as of 05/21/2017  Medication Sig  . ALPRAZolam (XANAX) 0.5 MG tablet Take 0.5 mg by mouth daily.  Marland Kitchen aspirin EC 81 MG tablet Take 81 mg by mouth daily.  . calcium carbonate (OS-CAL - DOSED IN MG OF ELEMENTAL CALCIUM) 1250 (500 Ca) MG tablet Take 1 tablet (500 mg of elemental calcium total) by mouth daily with breakfast.  . Cholecalciferol (VITAMIN D) 2000 UNITS CAPS Take 1 capsule by mouth daily.   . Cyanocobalamin (VITAMIN B-12 IJ) Inject 1 Dose as directed every 30 (thirty) days.  Hunt Oris (IMFINZI IV) Inject into the vein.  Marland Kitchen glipiZIDE (GLUCOTROL) 5 MG tablet Take 1 tablet (5 mg total) by mouth 2 (two) times daily before a meal. (Patient taking differently: Take 2.5 mg by mouth 2 (two) times daily before a meal. )  . lidocaine-prilocaine (EMLA) cream Apply a quarter size amount to port site 1 hour prior to chemo. Do not rub in. Cover with plastic wrap.  . lisinopril (PRINIVIL,ZESTRIL) 40 MG tablet Take 1 tablet (40 mg total) by mouth daily.  Marland Kitchen loratadine (CLARITIN) 10 MG tablet Take 10 mg by mouth daily.  . meclizine (ANTIVERT) 25 MG tablet Take 25 mg by mouth 2 (two) times daily.   . ondansetron (ZOFRAN) 8 MG tablet Take 1 tablet (8 mg total) by mouth every 8 (eight) hours as needed for nausea or vomiting.  . pantoprazole (PROTONIX) 40 MG tablet Take 40 mg by mouth 2 (two) times daily.   . prochlorperazine (COMPAZINE) 10 MG tablet Take 1 tablet (10 mg total) by mouth every 6 (six) hours as needed for nausea or vomiting.  . prochlorperazine (COMPAZINE) 10 MG tablet TAKE ONE TABLET BY MOUTH EVERY 6 HOURS AS NEEDED FOR NAUSEA AND VOMITING  . simvastatin (ZOCOR) 80 MG tablet Take 40 mg by mouth at bedtime.   Marland Kitchen terazosin (HYTRIN) 5 MG capsule Take 5 mg by mouth at bedtime.  Marland Kitchen  tiZANidine (ZANAFLEX) 4 MG tablet Take 4 mg by mouth at bedtime.   No facility-administered encounter medications on file as of 05/21/2017.  ALLERGIES:  Allergies  Allergen Reactions  . Morphine And Related Other (See Comments)    confusion     PHYSICAL EXAM:  ECOG Performance status: 1 - Symptomatic; remains largely independent (ambulates with walker)  RN vitals reviewed.   Physical Exam  Constitutional: He is oriented to person, place, and time and well-developed, well-nourished, and in no distress.  HENT:  Head: Normocephalic.  Mouth/Throat: Oropharynx is clear and moist. No oropharyngeal exudate.  Eyes: Conjunctivae are normal. Pupils are equal, round, and reactive to light. No scleral icterus.  Neck: Normal range of motion. Neck supple.  Cardiovascular: Normal rate and regular rhythm.  Pulmonary/Chest: Effort normal. No respiratory distress.     Abdominal: Soft. Bowel sounds are normal. There is no tenderness.  Musculoskeletal: Normal range of motion. He exhibits no edema.  Lymphadenopathy:    He has no cervical adenopathy.  Neurological: He is alert and oriented to person, place, and time. No cranial nerve deficit.  Ambulating with walker   Skin: Skin is warm and dry.  Psychiatric: Mood, memory, affect and judgment normal.  Nursing note and vitals reviewed.    LABORATORY DATA:  I have reviewed the labs as listed.  CBC    Component Value Date/Time   WBC 9.2 05/21/2017 0946   RBC 3.79 (L) 05/21/2017 0946   HGB 11.2 (L) 05/21/2017 0946   HCT 34.4 (L) 05/21/2017 0946   PLT 212 05/21/2017 0946   MCV 90.8 05/21/2017 0946   MCH 29.6 05/21/2017 0946   MCHC 32.6 05/21/2017 0946   RDW 13.6 05/21/2017 0946   LYMPHSABS 1.8 05/21/2017 0946   MONOABS 0.9 05/21/2017 0946   EOSABS 1.8 (H) 05/21/2017 0946   BASOSABS 0.1 05/21/2017 0946   CMP Latest Ref Rng & Units 05/21/2017 05/07/2017 04/23/2017  Glucose 65 - 99 mg/dL 194(H) 125(H) 179(H)  BUN 6 - 20 mg/dL 14 18  16  Creatinine 0.61 - 1.24 mg/dL 1.24 1.11 1.15  Sodium 135 - 145 mmol/L 135 133(L) 133(L)  Potassium 3.5 - 5.1 mmol/L 3.2(L) 3.5 3.5  Chloride 101 - 111 mmol/L 99(L) 100(L) 100(L)  CO2 22 - 32 mmol/L 28 25 26  Calcium 8.9 - 10.3 mg/dL 8.4(L) 8.8(L) 9.0  Total Protein 6.5 - 8.1 g/dL 6.0(L) 6.2(L) 6.1(L)  Total Bilirubin 0.3 - 1.2 mg/dL 0.5 0.5 0.6  Alkaline Phos 38 - 126 U/L 70 67 62  AST 15 - 41 U/L 26 27 27  ALT 17 - 63 U/L 18 20 23    PENDING LABS:    DIAGNOSTIC IMAGING:  *The following radiologic images and reports have been reviewed independently and agree with below findings.  CT chest/abd/pelvis: 12/07/16      PATHOLOGY:  Lung resection surgical path: 12/30/15         ASSESSMENT & PLAN:   Stage IIIA adenocarcinoma of right lung:  -Diagnosed in 11/2015. Treated with RML lobectomy/VATS with mediastinal lymph node dissection. PDL-1 negative. Underwent chemotherapy with Carbo/Alimta x 6 cycles, completing chemo on 05/31/16. Also had chest radiation in [place] which completed on 08/01/16.  Maintenance Imfinzi initiated on 09/04/16 with plans to continue for total of 1 year.  -CT chest/abd/pelvis on 02/12/17 reviewed in detail with patient and wife today, there is no evidence of recurrent or metastatic disease -Labs reviewed. Due for Imfinzi today and will proceed with treatment as scheduled.  -Continue Imfinzi every 2 weeks as scheduled. He is tolerating treatments very well.  -I have ordered his restaging CT C/A/P to be done in 4   weeks in January 2019. -Return to cancer center in 4 weeks for follow up   Hyponatremia:  -Improved. Na 135 today. Asymptomatic. -Continue fluid restrition to <1 liter daily. May need to put him on salt tablets if the sodium continues to decrease.  Fall prevention:  -Strongly recommended he continue using his walker and/or cane to ambulate.   Vitamin B12 deficiency:  -Continue monthly vitamin B12 injections.      Dispo:  -Continue Imfinzi  every 2 weeks.  -Return to cancer center in 4 weeks for follow-up.    All questions were answered to patient's stated satisfaction. Encouraged patient to call with any new concerns or questions before his next visit to the cancer center and we can certain see him sooner, if needed.     , MD   

## 2017-05-21 NOTE — Progress Notes (Signed)
[name] tolerated Imfinzi infusion well without complaints or incident. Labs reviewed with Dr. Talbert Cage and pt approved for Imfinzi along with Potassium 40 meq PO per MD. VSS upon discharge. Pt discharged via wheelchair in satisfactory condition accompanied by his wife

## 2017-05-22 ENCOUNTER — Other Ambulatory Visit (HOSPITAL_COMMUNITY): Payer: Self-pay | Admitting: Adult Health

## 2017-05-22 DIAGNOSIS — R7989 Other specified abnormal findings of blood chemistry: Secondary | ICD-10-CM

## 2017-05-22 LAB — T3: T3, Total: 105 ng/dL (ref 71–180)

## 2017-05-23 ENCOUNTER — Ambulatory Visit (HOSPITAL_COMMUNITY): Payer: Medicare Other

## 2017-05-31 IMAGING — NM NM MYOCAR MULTI W/SPECT W/WALL MOTION & EF
2 series · 12 of 12 positions shown · non-contrast
Comparison: none

[Series 1: rest · 8.28mm/px · 6 of 64 frames shown]
[frame 6/64]
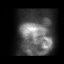
[frame 16/64]
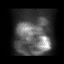
[frame 27/64]
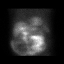
[frame 38/64]
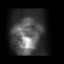
[frame 48/64]
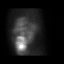
[frame 59/64]
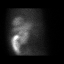

[Series 2: stress gated · 8.28mm/px · 6 of 64 frames shown]
[frame 6/64]
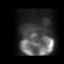
[frame 16/64]
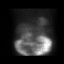
[frame 27/64]
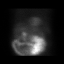
[frame 38/64]
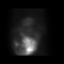
[frame 48/64]
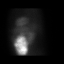
[frame 59/64]
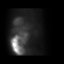

[12 of 12 positions shown; findings below may reference images not displayed]

Canned report from images found in remote index.

Refer to host system for actual result text.

## 2017-06-03 IMAGING — DX DG CHEST 1V PORT
1 series · 1 of 1 positions shown · non-contrast
Comparison: Radiographs December 16, 2015.

CLINICAL DATA: Status post right lobectomy.

EXAM:
PORTABLE CHEST 1 VIEW

[chest ap]
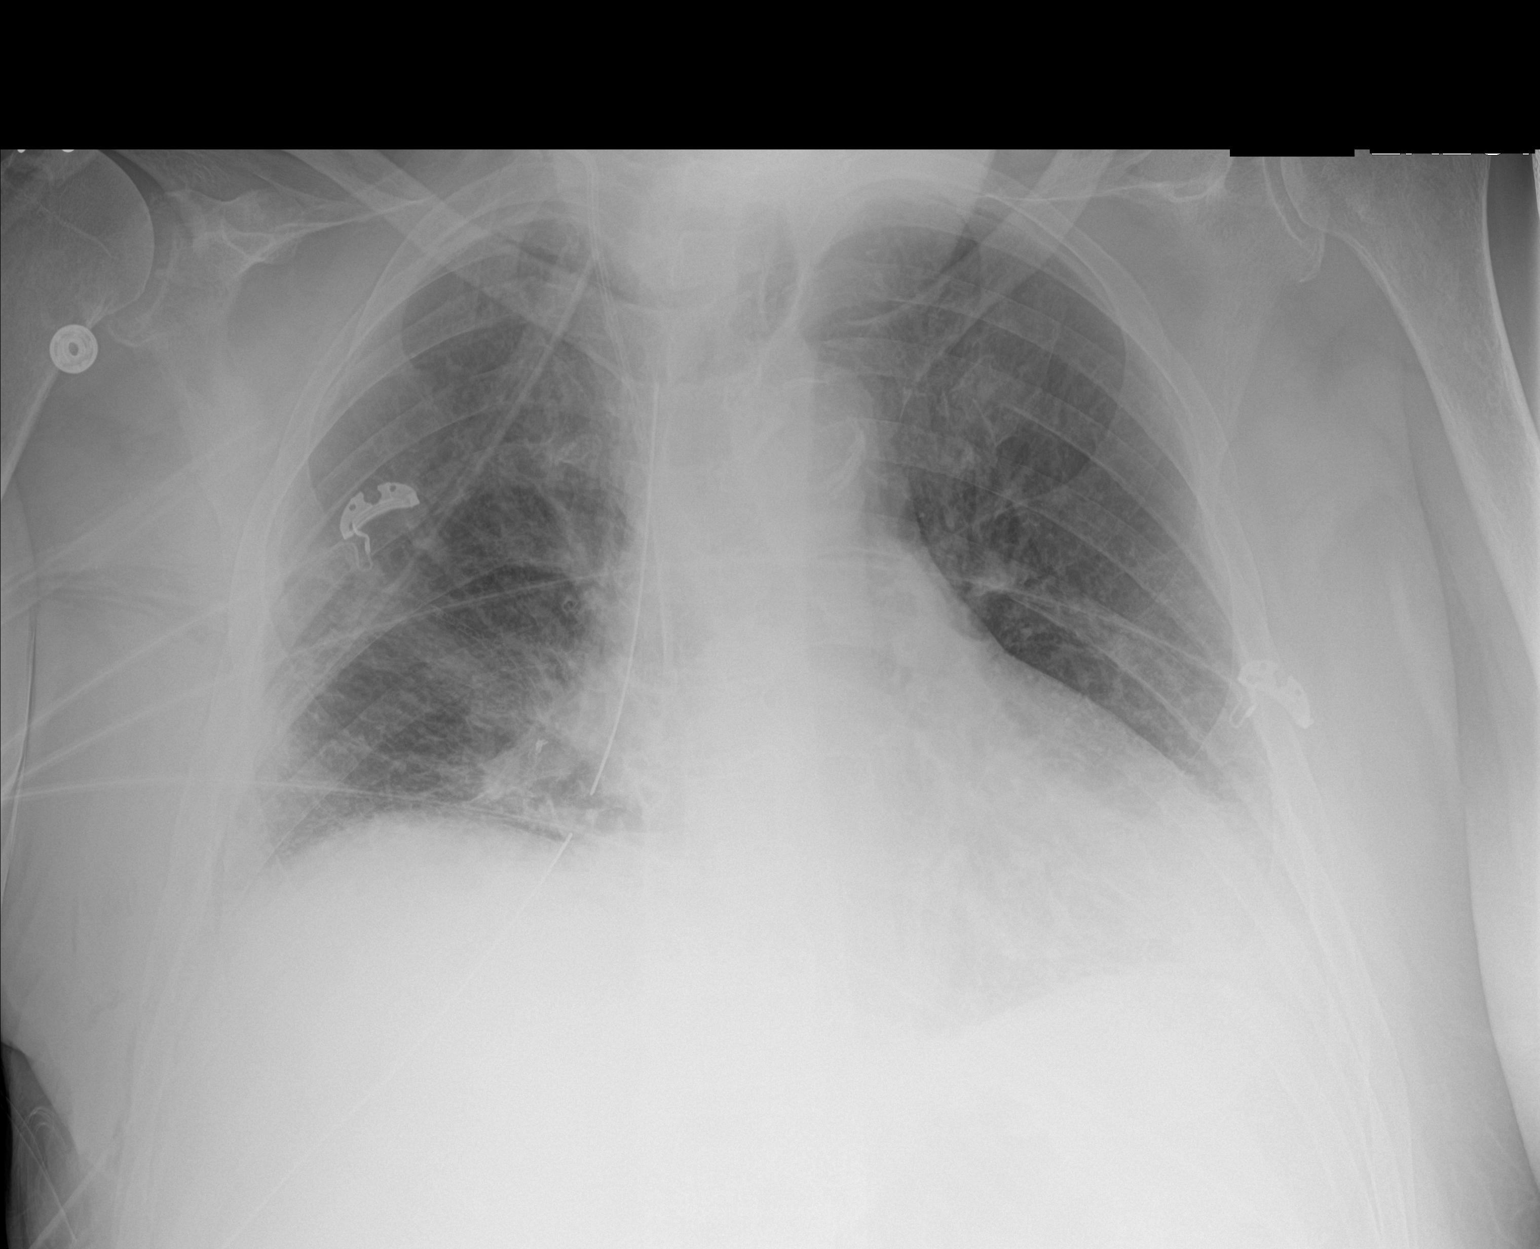

[1 of 1 positions shown; findings below may reference images not displayed]

FINDINGS: Stable cardiomediastinal silhouette. Atherosclerosis of thoracic
aorta is noted. No pneumothorax or significant pleural effusion is
noted. Two right-sided chest tubes are noted. Right perihilar and
basilar opacities are noted concerning for scarring or subsegmental
atelectasis. Right internal jugular catheter is noted with distal
tip in expected position of the SVC. Bony thorax is unremarkable.
IMPRESSION: Aortic atherosclerosis. Postsurgical changes seen in right lung. Two
right-sided chest tubes are noted without pneumothorax.

## 2017-06-05 ENCOUNTER — Encounter (HOSPITAL_BASED_OUTPATIENT_CLINIC_OR_DEPARTMENT_OTHER): Payer: Medicare Other

## 2017-06-05 ENCOUNTER — Encounter (HOSPITAL_COMMUNITY): Payer: Self-pay

## 2017-06-05 ENCOUNTER — Encounter: Payer: Self-pay | Admitting: Oncology

## 2017-06-05 VITALS — BP 141/59 | HR 82 | Temp 97.7°F | Resp 18 | Wt 213.0 lb

## 2017-06-05 DIAGNOSIS — C342 Malignant neoplasm of middle lobe, bronchus or lung: Secondary | ICD-10-CM | POA: Diagnosis not present

## 2017-06-05 DIAGNOSIS — Z5112 Encounter for antineoplastic immunotherapy: Secondary | ICD-10-CM | POA: Diagnosis present

## 2017-06-05 DIAGNOSIS — E538 Deficiency of other specified B group vitamins: Secondary | ICD-10-CM

## 2017-06-05 DIAGNOSIS — R7989 Other specified abnormal findings of blood chemistry: Secondary | ICD-10-CM

## 2017-06-05 DIAGNOSIS — C3491 Malignant neoplasm of unspecified part of right bronchus or lung: Secondary | ICD-10-CM

## 2017-06-05 DIAGNOSIS — R918 Other nonspecific abnormal finding of lung field: Secondary | ICD-10-CM | POA: Diagnosis not present

## 2017-06-05 LAB — CBC WITH DIFFERENTIAL/PLATELET
Basophils Absolute: 0 10*3/uL (ref 0.0–0.1)
Basophils Relative: 1 %
Eosinophils Absolute: 1.5 10*3/uL — ABNORMAL HIGH (ref 0.0–0.7)
Eosinophils Relative: 17 %
HCT: 34 % — ABNORMAL LOW (ref 39.0–52.0)
Hemoglobin: 11.2 g/dL — ABNORMAL LOW (ref 13.0–17.0)
Lymphocytes Relative: 23 %
Lymphs Abs: 2 10*3/uL (ref 0.7–4.0)
MCH: 29.4 pg (ref 26.0–34.0)
MCHC: 32.9 g/dL (ref 30.0–36.0)
MCV: 89.2 fL (ref 78.0–100.0)
Monocytes Absolute: 0.7 10*3/uL (ref 0.1–1.0)
Monocytes Relative: 8 %
Neutro Abs: 4.6 10*3/uL (ref 1.7–7.7)
Neutrophils Relative %: 51 %
Platelets: 228 10*3/uL (ref 150–400)
RBC: 3.81 MIL/uL — ABNORMAL LOW (ref 4.22–5.81)
RDW: 13.6 % (ref 11.5–15.5)
WBC: 8.8 10*3/uL (ref 4.0–10.5)

## 2017-06-05 LAB — COMPREHENSIVE METABOLIC PANEL
ALT: 17 U/L (ref 17–63)
AST: 26 U/L (ref 15–41)
Albumin: 3.2 g/dL — ABNORMAL LOW (ref 3.5–5.0)
Alkaline Phosphatase: 65 U/L (ref 38–126)
Anion gap: 9 (ref 5–15)
BUN: 15 mg/dL (ref 6–20)
CO2: 24 mmol/L (ref 22–32)
Calcium: 8.7 mg/dL — ABNORMAL LOW (ref 8.9–10.3)
Chloride: 99 mmol/L — ABNORMAL LOW (ref 101–111)
Creatinine, Ser: 1.17 mg/dL (ref 0.61–1.24)
GFR calc Af Amer: >60 mL/min (ref >60)
GFR calc non Af Amer: 55 mL/min — ABNORMAL LOW (ref >60)
Glucose, Bld: 163 mg/dL — ABNORMAL HIGH (ref 65–99)
Potassium: 3.5 mmol/L (ref 3.5–5.1)
Sodium: 132 mmol/L — ABNORMAL LOW (ref 135–145)
Total Bilirubin: 0.6 mg/dL (ref 0.3–1.2)
Total Protein: 5.9 g/dL — ABNORMAL LOW (ref 6.5–8.1)

## 2017-06-05 LAB — T4, FREE: Free T4: 0.85 ng/dL (ref 0.61–1.12)

## 2017-06-05 LAB — TSH: TSH: 5.856 u[IU]/mL — ABNORMAL HIGH (ref 0.350–4.500)

## 2017-06-05 MED ORDER — ALBUTEROL SULFATE (2.5 MG/3ML) 0.083% IN NEBU: 2.5000 mg | INHALATION_SOLUTION | Freq: Once | RESPIRATORY_TRACT | Status: DC | PRN

## 2017-06-05 MED ORDER — HEPARIN SOD (PORK) LOCK FLUSH 100 UNIT/ML IV SOLN
500.0000 [IU] | Freq: Once | INTRAVENOUS | Status: AC | PRN
  Administered 2017-06-05: 500 [IU]
  Filled 2017-06-05: qty 5

## 2017-06-05 MED ORDER — FAMOTIDINE IN NACL 20-0.9 MG/50ML-% IV SOLN: 20.0000 mg | Freq: Once | INTRAVENOUS | Status: DC | PRN

## 2017-06-05 MED ORDER — ALTEPLASE 2 MG IJ SOLR: 2.0000 mg | Freq: Once | INTRAMUSCULAR | Status: DC | PRN

## 2017-06-05 MED ORDER — METHYLPREDNISOLONE SODIUM SUCC 125 MG IJ SOLR: 125.0000 mg | Freq: Once | INTRAMUSCULAR | Status: DC | PRN

## 2017-06-05 MED ORDER — SODIUM CHLORIDE 0.9 % IV SOLN
Freq: Once | INTRAVENOUS | Status: AC
  Administered 2017-06-05: 12:00:00 via INTRAVENOUS

## 2017-06-05 MED ORDER — EPINEPHRINE PF 1 MG/ML IJ SOLN: 0.5000 mg | Freq: Once | INTRAMUSCULAR | Status: DC | PRN

## 2017-06-05 MED ORDER — HEPARIN SOD (PORK) LOCK FLUSH 100 UNIT/ML IV SOLN: 250.0000 [IU] | Freq: Once | INTRAVENOUS | Status: DC | PRN

## 2017-06-05 MED ORDER — DURVALUMAB 500 MG/10ML IV SOLN
10.5000 mg/kg | Freq: Once | INTRAVENOUS | Status: AC
  Administered 2017-06-05: 980 mg via INTRAVENOUS
  Filled 2017-06-05: qty 10

## 2017-06-05 MED ORDER — SODIUM CHLORIDE 0.9% FLUSH
10.0000 mL | INTRAVENOUS | Status: DC | PRN
Start: 2017-06-05 — End: 2017-06-05
  Administered 2017-06-05: 10 mL
  Filled 2017-06-05: qty 10

## 2017-06-05 MED ORDER — SODIUM CHLORIDE 0.9% FLUSH: 3.0000 mL | INTRAVENOUS | Status: DC | PRN

## 2017-06-05 MED ORDER — SODIUM CHLORIDE 0.9 % IV SOLN: Freq: Once | INTRAVENOUS | Status: DC | PRN

## 2017-06-05 MED ORDER — CYANOCOBALAMIN 1000 MCG/ML IJ SOLN
1000.0000 ug | Freq: Once | INTRAMUSCULAR | Status: AC
  Administered 2017-06-05: 1000 ug via INTRAMUSCULAR
  Filled 2017-06-05: qty 1

## 2017-06-05 MED ORDER — EPINEPHRINE PF 1 MG/10ML IJ SOSY: 0.2500 mg | PREFILLED_SYRINGE | Freq: Once | INTRAMUSCULAR | Status: DC | PRN

## 2017-06-05 MED ORDER — DIPHENHYDRAMINE HCL 50 MG/ML IJ SOLN: 25.0000 mg | Freq: Once | INTRAMUSCULAR | Status: DC | PRN

## 2017-06-05 MED ORDER — DIPHENHYDRAMINE HCL 50 MG/ML IJ SOLN: 50.0000 mg | Freq: Once | INTRAMUSCULAR | Status: DC | PRN

## 2017-06-05 NOTE — Patient Instructions (Signed)
[facility] Discharge Instructions for Patients Receiving Chemotherapy   Beginning January 23rd 2017 lab work for the [facility] will be done in the  Main lab at [facility] on 1st floor. If you have a lab appointment with the [facility] please come in thru the  Main Entrance and check in at the main information desk   Today you received the following chemotherapy agents Imfinzi. Follow-up as scheduled.Call clinic for any questions or concerns  To help prevent nausea and vomiting after your treatment, we encourage you to take your nausea medication   If you develop nausea and vomiting, or diarrhea that is not controlled by your medication, call the clinic.  The clinic phone number is [phone]. Office hours are Monday-Friday 8:30am-5:00pm.  BELOW ARE SYMPTOMS THAT SHOULD BE REPORTED IMMEDIATELY:  *FEVER GREATER THAN 101.0 F  *CHILLS WITH OR WITHOUT FEVER  NAUSEA AND VOMITING THAT IS NOT CONTROLLED WITH YOUR NAUSEA MEDICATION  *UNUSUAL SHORTNESS OF BREATH  *UNUSUAL BRUISING OR BLEEDING  TENDERNESS IN MOUTH AND THROAT WITH OR WITHOUT PRESENCE OF ULCERS  *URINARY PROBLEMS  *BOWEL PROBLEMS  UNUSUAL RASH Items with * indicate a potential emergency and should be followed up as soon as possible. If you have an emergency after office hours please contact your primary care physician or go to the nearest emergency department.  Please call the clinic during office hours if you have any questions or concerns.   You may also contact the Patient Navigator at [phone] should you have any questions or need assistance in obtaining follow up care.      Resources For Cancer Patients and their Caregivers ? American Cancer Society: Can assist with transportation, wigs, general needs, runs Look Good Feel Better.        225-408-0097 ? Cancer Care: Provides financial assistance, online support groups, medication/co-pay assistance.  1-800-813-HOPE  531-103-0504) ? [place] Assists [place] cancer patients and their families through emotional , educational and financial support.  445-404-7618 ? [place] DSS Where to apply for food stamps, Medicaid and utility assistance. 7577053296 ? [facility]: Transportation to medical appointments. (720)414-2153 ? Social Security Administration: May apply for disability if have a Stage IV cancer. [phone] 726-749-6713 ? [employer], Disability and Transit Services: Assists with nutrition, care and transit needs. 813-334-3284

## 2017-06-05 NOTE — Progress Notes (Signed)
1145 Lab results reviewed with Mike Craze NP and pt approved for Imfinzi tx today.                                    [name] tolerated Imfinzi infusion and Vit B12 injection well without complaints or incident. VSS upon discharge. Pt discharged self ambulatory using walker in satisfactory condition accompanied by his wife

## 2017-06-06 LAB — T3: T3, Total: 93 ng/dL (ref 71–180)

## 2017-06-17 ENCOUNTER — Ambulatory Visit (HOSPITAL_COMMUNITY)
Admission: RE | Admit: 2017-06-17 | Discharge: 2017-06-17 | Disposition: A | Discharge status: Home / Self Care | Payer: Medicare Other | Source: Ambulatory Visit | Attending: Oncology | Admitting: Oncology

## 2017-06-17 DIAGNOSIS — Z902 Acquired absence of lung [part of]: Secondary | ICD-10-CM | POA: Diagnosis not present

## 2017-06-17 DIAGNOSIS — I2510 Atherosclerotic heart disease of native coronary artery without angina pectoris: Secondary | ICD-10-CM | POA: Diagnosis not present

## 2017-06-17 DIAGNOSIS — J432 Centrilobular emphysema: Secondary | ICD-10-CM | POA: Diagnosis not present

## 2017-06-17 DIAGNOSIS — I700 Atherosclerosis of aorta: Secondary | ICD-10-CM | POA: Diagnosis not present

## 2017-06-17 DIAGNOSIS — C3491 Malignant neoplasm of unspecified part of right bronchus or lung: Secondary | ICD-10-CM | POA: Diagnosis present

## 2017-06-17 DIAGNOSIS — J90 Pleural effusion, not elsewhere classified: Secondary | ICD-10-CM | POA: Insufficient documentation

## 2017-06-17 DIAGNOSIS — D3501 Benign neoplasm of right adrenal gland: Secondary | ICD-10-CM | POA: Diagnosis not present

## 2017-06-17 MED ORDER — IOPAMIDOL (ISOVUE-300) INJECTION 61%
100.0000 mL | Freq: Once | INTRAVENOUS | Status: AC | PRN
  Administered 2017-06-17: 100 mL via INTRAVENOUS

## 2017-06-19 ENCOUNTER — Ambulatory Visit (HOSPITAL_COMMUNITY): Payer: Medicare Other | Admitting: Hematology and Oncology

## 2017-06-19 ENCOUNTER — Inpatient Hospital Stay (HOSPITAL_BASED_OUTPATIENT_CLINIC_OR_DEPARTMENT_OTHER): Payer: Medicare Other | Admitting: Hematology and Oncology

## 2017-06-19 ENCOUNTER — Encounter (HOSPITAL_COMMUNITY): Payer: Self-pay

## 2017-06-19 ENCOUNTER — Encounter (HOSPITAL_COMMUNITY): Payer: Self-pay | Admitting: Hematology and Oncology

## 2017-06-19 ENCOUNTER — Other Ambulatory Visit: Payer: Self-pay

## 2017-06-19 ENCOUNTER — Inpatient Hospital Stay (HOSPITAL_COMMUNITY): Payer: Medicare Other | Attending: Hematology and Oncology

## 2017-06-19 VITALS — BP 122/55 | HR 85 | Temp 97.5°F | Resp 20 | Wt 209.0 lb

## 2017-06-19 DIAGNOSIS — C3491 Malignant neoplasm of unspecified part of right bronchus or lung: Secondary | ICD-10-CM

## 2017-06-19 DIAGNOSIS — E119 Type 2 diabetes mellitus without complications: Secondary | ICD-10-CM

## 2017-06-19 DIAGNOSIS — Z5112 Encounter for antineoplastic immunotherapy: Secondary | ICD-10-CM | POA: Insufficient documentation

## 2017-06-19 DIAGNOSIS — C342 Malignant neoplasm of middle lobe, bronchus or lung: Secondary | ICD-10-CM

## 2017-06-19 DIAGNOSIS — Z87891 Personal history of nicotine dependence: Secondary | ICD-10-CM | POA: Insufficient documentation

## 2017-06-19 LAB — CBC WITH DIFFERENTIAL/PLATELET
Basophils Absolute: 0 10*3/uL (ref 0.0–0.1)
Basophils Relative: 1 %
Eosinophils Absolute: 0.5 10*3/uL (ref 0.0–0.7)
Eosinophils Relative: 6 %
HCT: 33 % — ABNORMAL LOW (ref 39.0–52.0)
Hemoglobin: 10.9 g/dL — ABNORMAL LOW (ref 13.0–17.0)
Lymphocytes Relative: 22 %
Lymphs Abs: 1.7 10*3/uL (ref 0.7–4.0)
MCH: 29.5 pg (ref 26.0–34.0)
MCHC: 33 g/dL (ref 30.0–36.0)
MCV: 89.4 fL (ref 78.0–100.0)
Monocytes Absolute: 0.9 10*3/uL (ref 0.1–1.0)
Monocytes Relative: 12 %
Neutro Abs: 4.7 10*3/uL (ref 1.7–7.7)
Neutrophils Relative %: 59 %
Platelets: 221 10*3/uL (ref 150–400)
RBC: 3.69 MIL/uL — ABNORMAL LOW (ref 4.22–5.81)
RDW: 13.6 % (ref 11.5–15.5)
WBC: 7.8 10*3/uL (ref 4.0–10.5)

## 2017-06-19 LAB — COMPREHENSIVE METABOLIC PANEL
ALT: 20 U/L (ref 17–63)
AST: 27 U/L (ref 15–41)
Albumin: 3.3 g/dL — ABNORMAL LOW (ref 3.5–5.0)
Alkaline Phosphatase: 67 U/L (ref 38–126)
Anion gap: 6 (ref 5–15)
BUN: 18 mg/dL (ref 6–20)
CO2: 26 mmol/L (ref 22–32)
Calcium: 8.6 mg/dL — ABNORMAL LOW (ref 8.9–10.3)
Chloride: 104 mmol/L (ref 101–111)
Creatinine, Ser: 1.17 mg/dL (ref 0.61–1.24)
GFR calc Af Amer: >60 mL/min (ref >60)
GFR calc non Af Amer: 55 mL/min — ABNORMAL LOW (ref >60)
Glucose, Bld: 101 mg/dL — ABNORMAL HIGH (ref 65–99)
Potassium: 3.9 mmol/L (ref 3.5–5.1)
Sodium: 136 mmol/L (ref 135–145)
Total Bilirubin: 0.4 mg/dL (ref 0.3–1.2)
Total Protein: 6.1 g/dL — ABNORMAL LOW (ref 6.5–8.1)

## 2017-06-19 MED ORDER — DURVALUMAB 500 MG/10ML IV SOLN
980.0000 mg | Freq: Once | INTRAVENOUS | Status: AC
  Administered 2017-06-19: 980 mg via INTRAVENOUS
  Filled 2017-06-19: qty 10

## 2017-06-19 MED ORDER — SODIUM CHLORIDE 0.9 % IV SOLN
Freq: Once | INTRAVENOUS | Status: AC
Start: 2017-06-19 — End: 2017-06-19
  Administered 2017-06-19: 12:00:00 via INTRAVENOUS

## 2017-06-19 MED ORDER — SODIUM CHLORIDE 0.9% FLUSH
10.0000 mL | INTRAVENOUS | Status: DC | PRN
  Administered 2017-06-19: 10 mL
  Filled 2017-06-19: qty 10

## 2017-06-19 MED ORDER — HEPARIN SOD (PORK) LOCK FLUSH 100 UNIT/ML IV SOLN
500.0000 [IU] | Freq: Once | INTRAVENOUS | Status: AC | PRN
  Administered 2017-06-19: 500 [IU]
  Filled 2017-06-19: qty 5

## 2017-06-19 NOTE — Patient Instructions (Signed)
[facility] Discharge Instructions for Patients Receiving Chemotherapy   Beginning January 23rd 2017 lab work for the [facility] will be done in the  Main lab at [facility] on 1st floor. If you have a lab appointment with the [facility] please come in thru the  Main Entrance and check in at the main information desk   Today you received the following chemotherapy agents Imfinzi. Follow-up as scheduled. Call clinic for any questions or concerns  To help prevent nausea and vomiting after your treatment, we encourage you to take your nausea medication   If you develop nausea and vomiting, or diarrhea that is not controlled by your medication, call the clinic.  The clinic phone number is [phone]. Office hours are Monday-Friday 8:30am-5:00pm.  BELOW ARE SYMPTOMS THAT SHOULD BE REPORTED IMMEDIATELY:  *FEVER GREATER THAN 101.0 F  *CHILLS WITH OR WITHOUT FEVER  NAUSEA AND VOMITING THAT IS NOT CONTROLLED WITH YOUR NAUSEA MEDICATION  *UNUSUAL SHORTNESS OF BREATH  *UNUSUAL BRUISING OR BLEEDING  TENDERNESS IN MOUTH AND THROAT WITH OR WITHOUT PRESENCE OF ULCERS  *URINARY PROBLEMS  *BOWEL PROBLEMS  UNUSUAL RASH Items with * indicate a potential emergency and should be followed up as soon as possible. If you have an emergency after office hours please contact your primary care physician or go to the nearest emergency department.  Please call the clinic during office hours if you have any questions or concerns.   You may also contact the Patient Navigator at [phone] should you have any questions or need assistance in obtaining follow up care.      Resources For Cancer Patients and their Caregivers ? American Cancer Society: Can assist with transportation, wigs, general needs, runs Look Good Feel Better.        [phone] ? Cancer Care: Provides financial assistance, online support groups, medication/co-pay assistance.  1-800-813-HOPE  305-562-7817) ? [place] Assists [place] cancer patients and their families through emotional , educational and financial support.  6146714500 ? [place] DSS Where to apply for food stamps, Medicaid and utility assistance. 651-561-3409 ? [employer]: Transportation to medical appointments. ([phone] 596-9177 ? Social Security Administration: May apply for disability if have a Stage IV cancer. [phone] 231-377-2845 ? [employer], Disability and Transit Services: Assists with nutrition, care and transit needs. [phone]

## 2017-06-19 NOTE — Progress Notes (Signed)
[name] Public tolerated Imfinzi infusion well without complaints or incident. Labs reviewed and pt seen by Dr. Lebron Conners today prior to administering this medication. VSS upon discharge. Pt discharged self ambulatory using walker in satisfactory condition accompanied by his wife

## 2017-06-30 NOTE — Assessment & Plan Note (Signed)
84 y.o. male with previous diagnosis of stage III a adenocarcinoma of the lung, currently undergoing maintenance immunotherapy with Imfinzi. Interval disease assessment with CT of the chest/abdomen/pelvis showed no Evidence of disease recurrence.  Patient shows excellent tolerance of treatment.  Clinical evaluation lab workup permissive to proceed with treatment as previously scheduled.  Plan: --Proceed with immunotherapy today and in 2 weeks as previously scheduled -- Return to clinic in 4 weeks with labs, clinic visit, and subsequent treatment with immunotherapy. --Next disease assessment with CT of the chest/abdomen/pelvis in April 2019.

## 2017-06-30 NOTE — Progress Notes (Signed)
[facility] Follow-up Visit:  Assessment: Adenocarcinoma of right lung, stage 3 ([place]) 84 y.o. male with previous diagnosis of stage III a adenocarcinoma of the lung, currently undergoing maintenance immunotherapy with Imfinzi. Interval disease assessment with CT of the chest/abdomen/pelvis showed no Evidence of disease recurrence.  Patient shows excellent tolerance of treatment.  Clinical evaluation lab workup permissive to proceed with treatment as previously scheduled.  Plan: --Proceed with immunotherapy today and in 2 weeks as previously scheduled -- Return to clinic in 4 weeks with labs, clinic visit, and subsequent treatment with immunotherapy. --Next disease assessment with CT of the chest/abdomen/pelvis in April 2019.    Voice recognition software was used and creation of this note. Despite my best effort at editing the text, some misspelling/errors may have occurred.  Orders Placed This Encounter  Procedures  . CBC with Differential    Standing Status:   Future    Standing Expiration Date:   06/19/2018  . Comprehensive metabolic panel    Standing Status:   Future    Standing Expiration Date:   06/19/2018  . TSH    Standing Status:   Future    Standing Expiration Date:   06/19/2018    Cancer Staging Adenocarcinoma of right lung, stage 3 ([place]) Staging form: Lung, AJCC 7th Edition - Clinical stage from 12/09/2015: Stage IA (T1a, N0, M0) - Signed by Baird Cancer, PA-C on 12/09/2015 - Pathologic stage from 01/20/2016: Stage IIIA (T1a, N2, cM0) - Signed by Baird Cancer, PA-C on 02/01/2016   All questions were answered. . The patient knows to call the clinic with any problems, questions or concerns.  This note was electronically signed.    History of Presenting [place] 84 y.o. presenting to the [facility] for diagnosis of stage IIIa adenocarcinoma of the lung, receiving curative-intent immunotherapy maintenance with Imfinzi interim,  patient underwent CT of the chest/abdomen/pelvis on 01./0 7/19 demonstrating no evidence of disease recurrence.  Subsequent to contrast administration, patient has had some diarrhea problems.  Otherwise, he remains asymptomatic.  Oncological/hematological History:   Adenocarcinoma of right lung, stage 3 (HCC)   11/10/2015 Imaging    13 mm spiculated lesion seen in right middle lobe.      11/30/2015 PET scan    Spiculated right middle lobe nodule is mildly hypermetabolic and most consistent with a stage IA adenocarcinoma.      12/30/2015 Surgery    Right middle lobectomy and node dissection      12/30/2015 Procedure    Right video-assisted thoracoscopy, Thoracoscopic right middle lobectomy, Mediastinal lymph node dissection, and On-Q local anesthetic catheter placement.      01/02/2016 Pathology Results    1. Lung, resection (segmental or lobe), Right Middle Lobe - INVASIVE ADENOCARCINOMA, WELL DIFFERENTIATED, SPANNING 1.2 CM. - THE SURGICAL RESECTION MARGINS ARE NEGATIVE FOR CARCINOMA. 2. Lymph node, biopsy, Level 12 - METASTATIC CARCINOMA IN 1 OF 1 LYMPH NODE (1/1). 3. Lymph node, biopsy, Level 12 #2 - METASTATIC CARCINOMA IN 1 OF 1 LYMPH NODE (1/1). 4. Lymph node, biopsy, Level 7 - METASTATIC CARCINOMA IN 1 OF 1 LYMPH NODE (1/1/). 5. Lymph node, biopsy, Level 7 #2 - METASTATIC CARCINOMA IN 1 OF 1 LYMPH NODE (1/1). 6. Lymph node, biopsy, Level 7 #3 - METASTATIC CARCINOMA IN 1 OF 1 LYMPH NODE (1/1). 7. Lymph node, biopsy, Level 7 #4 - METASTATIC CARCINOMA IN 1 OF 1 LYMPH NODE (1/1). 8. Lymph node, biopsy, 4R - THERE IS NO EVIDENCE OF  CARCINOMA IN 1 OF 1 LYMPH NODE (0/1). 9. Lymph node, biopsy, 4R #2 - THERE IS NO EVIDENCE OF CARCINOMA IN 1 OF 1 LYMPH NODE (0/1).      01/05/2016 Pathology Results    PDL1 NEGATIVE- tumor proportion score of 0%.       01/05/2016 Pathology Results    Genomic alterations identified: BRAF V600E, KIT amplification, PDGFRA amplification, CDKN2A/B  loss, TP53 S95f*33.  Additional findings: MSI-STABLE.  No reportable alterations identified: EGFR, KRAS, ALK, MET, RET, ERBB2, ROS1      02/09/2016 Procedure    Port placed by IR.      02/16/2016 - 05/31/2016 Chemotherapy    Carboplatin/Pemetrexed x 6 cycles        - 08/01/2016 Radiation Therapy    [place], [place]      08/28/2016 Imaging    CT CAP- No evidence of recurrent or metastatic carcinoma within the chest, abdomen, or pelvis.  Stable incidental findings include a absent, small benign right adrenal adenoma, sigmoid diverticulosis, and aortic atherosclerosis.      09/04/2016 -  Chemotherapy    Imfinzi immunotherapy up to 52 weeks.       12/07/2016 Imaging    CT C/A/P: New focal streaky opacities within the peripheral posterior right lower lobe and anteromedial left upper lobe- likely atelectasis with infection or other process is less likely. Recommend attention to these areas on future scans.  Small right pleural effusion, slightly increased from 08/28/2016. No evidence of pleural mass or enhancement.  No evidence of malignancy/ metastatic disease within the abdomen or pelvis.       02/12/2017 Imaging    CT chest: IMPRESSION: 1. Stable CT of the chest. No specific findings identified to suggest residual or recurrence of tumor. 2. Persistent right pleural effusion. 3. Paramediastinal radiation change predominantly involving the right lung is similar to previous study. 4. Stable right adrenal gland nodule and low-density foci within the liver. 5. Aortic Atherosclerosis (ICD10-I70.0) and Emphysema (ICD10-J43.9). Multi vessel coronary artery calcifications noted.       Medical History: Past Medical History:  Diagnosis Date  . Adenocarcinoma of lung, stage 3 (HCC)    Stage IIIA  . Adenocarcinoma of right lung, stage 3 ([place] 12/02/2015  . Arthritis   . Atrial fibrillation, transient ([place]    transient postop, < 24 hours  . B12 deficiency   . Cyst of scrotum    . Diverticulitis   . GERD (gastroesophageal reflux disease)   . Hernia   . History of pneumonia   . Hyperlipidemia   . Lung nodule    Spiculated right middle lobe nodule, hypermetabolic  . Type 2 diabetes mellitus ([facility]     Surgical History: Past Surgical History:  Procedure Laterality Date  . BACK SURGERY    . CHOLECYSTECTOMY    . COLONOSCOPY N/A 11/24/2015   Procedure: COLONOSCOPY;  Surgeon: NRogene Houston MD;  Location: [facility];  Service: Endoscopy;  Laterality: N/A;  730  . COLONOSCOPY W/ POLYPECTOMY     x 5  . CYST EXCISION     Scrotum  . EYE SURGERY Bilateral    Cataract with Lens  . HERNIA REPAIR Right    Inguinal  . IR GENERIC HISTORICAL  02/09/2016   IR [place] VASC ACCESS RIGHT 02/09/2016 DArne Cleveland MD [facility]-INTERV RAD  . IR GENERIC HISTORICAL  02/09/2016   IR FLUORO GUIDE CV LINE RIGHT 02/09/2016 DArne Cleveland MD [facility]INTER[facility] RAD  . LUMBAR DISC SURGERY     per  patient report  . VIDEO ASSISTED THORACOSCOPY (VATS)/ LOBECTOMY Right 12/30/2015   Procedure: VIDEO ASSISTED THORACOSCOPY (VATS)/RIGHT MIDDLE LOBECTOMY;  Surgeon: Melrose Nakayama, MD;  Location: [facility];  Service: Thoracic;  Laterality: Right;    Family History: Family History  Problem Relation Age of Onset  . Colon cancer Brother     Social History: Social History   Socioeconomic History  . Marital status: Married    Spouse name: Not on file  . Number of children: Not on file  . Years of education: Not on file  . Highest education level: Not on file  Social Needs  . Financial resource strain: Not on file  . Food insecurity - worry: Not on file  . Food insecurity - inability: Not on file  . Transportation needs - medical: Not on file  . Transportation needs - non-medical: Not on file  Occupational History  . Not on file  Tobacco Use  . Smoking status: Former Smoker    Types: Cigarettes    Last attempt to quit: 05/27/2004    Years since quitting: 13.1  . Smokeless tobacco: Never  Used  Substance and Sexual Activity  . Alcohol use: No    Alcohol/week: 0.0 oz  . Drug use: No  . Sexual activity: Not on file    Comment: married  Other Topics Concern  . Not on file  Social History Narrative  . Not on file    Allergies: Allergies  Allergen Reactions  . Morphine And Related Other (See Comments)    confusion    Medications:  Current Outpatient Medications  Medication Sig Dispense Refill  . ALPRAZolam (XANAX) 0.5 MG tablet Take 0.5 mg by mouth daily.    Marland Kitchen aspirin EC 81 MG tablet Take 81 mg by mouth daily.    . calcium carbonate (OS-CAL - DOSED IN MG OF ELEMENTAL CALCIUM) 1250 (500 Ca) MG tablet Take 1 tablet (500 mg of elemental calcium total) by mouth daily with breakfast. 30 tablet 2  . Cholecalciferol (VITAMIN D) 2000 UNITS CAPS Take 1 capsule by mouth daily.     . Cyanocobalamin (VITAMIN B-12 IJ) Inject 1 Dose as directed every 30 (thirty) days.    Hunt Oris (IMFINZI IV) Inject into the vein.    Marland Kitchen glipiZIDE (GLUCOTROL) 5 MG tablet Take 1 tablet (5 mg total) by mouth 2 (two) times daily before a meal. (Patient taking differently: Take 2.5 mg by mouth 2 (two) times daily before a meal. ) 60 tablet 1  . lidocaine-prilocaine (EMLA) cream Apply a quarter size amount to port site 1 hour prior to chemo. Do not rub in. Cover with plastic wrap. 30 g 3  . lisinopril (PRINIVIL,ZESTRIL) 40 MG tablet Take 1 tablet (40 mg total) by mouth daily. 30 tablet 1  . loratadine (CLARITIN) 10 MG tablet Take 10 mg by mouth daily.    . meclizine (ANTIVERT) 25 MG tablet Take 25 mg by mouth 2 (two) times daily.     . ondansetron (ZOFRAN) 8 MG tablet Take 1 tablet (8 mg total) by mouth every 8 (eight) hours as needed for nausea or vomiting. 30 tablet 2  . pantoprazole (PROTONIX) 40 MG tablet Take 40 mg by mouth 2 (two) times daily.     . prochlorperazine (COMPAZINE) 10 MG tablet Take 1 tablet (10 mg total) by mouth every 6 (six) hours as needed for nausea or vomiting. 30 tablet 2  .  prochlorperazine (COMPAZINE) 10 MG tablet TAKE ONE TABLET BY MOUTH EVERY 6  HOURS AS NEEDED FOR NAUSEA AND VOMITING 30 tablet 2  . simvastatin (ZOCOR) 80 MG tablet Take 40 mg by mouth at bedtime.     Marland Kitchen terazosin (HYTRIN) 5 MG capsule Take 5 mg by mouth at bedtime.    Marland Kitchen tiZANidine (ZANAFLEX) 4 MG tablet Take 4 mg by mouth at bedtime.     No current facility-administered medications for this visit.     Review of Systems: Review of Systems  Gastrointestinal: Positive for diarrhea.  All other systems reviewed and are negative.    PHYSICAL EXAMINATION There were no vitals taken for this visit.  ECOG PERFORMANCE STATUS: 1 - Symptomatic but completely ambulatory  Physical Exam  Constitutional: He is well-developed, well-nourished, and in no distress. No distress.  HENT:  Head: Normocephalic and atraumatic.  Mouth/Throat: Oropharynx is clear and moist. No oropharyngeal exudate.  Eyes: Conjunctivae and EOM are normal. Pupils are equal, round, and reactive to light. No scleral icterus.  Neck: No thyromegaly present.  Cardiovascular: Normal rate, regular rhythm and normal heart sounds.  No murmur heard. Pulmonary/Chest: Effort normal and breath sounds normal. No respiratory distress. He has no wheezes. He has no rales.  Abdominal: Soft. Bowel sounds are normal. He exhibits no distension and no mass. There is no tenderness. There is no guarding.  Lymphadenopathy:    He has no cervical adenopathy.  Skin: He is not diaphoretic.     LABORATORY DATA: I have personally reviewed the data as listed: Infusion on 06/19/2017  Component Date Value Ref Range Status  . WBC 06/19/2017 7.8  4.0 - 10.5 K/uL Final  . RBC 06/19/2017 3.69* 4.22 - 5.81 MIL/uL Final  . Hemoglobin 06/19/2017 10.9* 13.0 - 17.0 g/dL Final  . HCT 06/19/2017 33.0* 39.0 - 52.0 % Final  . MCV 06/19/2017 89.4  78.0 - 100.0 fL Final  . MCH 06/19/2017 29.5  26.0 - 34.0 pg Final  . MCHC 06/19/2017 33.0  30.0 - 36.0 g/dL Final  .  RDW 06/19/2017 13.6  11.5 - 15.5 % Final  . Platelets 06/19/2017 221  150 - 400 K/uL Final  . Neutrophils Relative % 06/19/2017 59  % Final  . Neutro Abs 06/19/2017 4.7  1.7 - 7.7 K/uL Final  . Lymphocytes Relative 06/19/2017 22  % Final  . Lymphs Abs 06/19/2017 1.7  0.7 - 4.0 K/uL Final  . Monocytes Relative 06/19/2017 12  % Final  . Monocytes Absolute 06/19/2017 0.9  0.1 - 1.0 K/uL Final  . Eosinophils Relative 06/19/2017 6  % Final  . Eosinophils Absolute 06/19/2017 0.5  0.0 - 0.7 K/uL Final  . Basophils Relative 06/19/2017 1  % Final  . Basophils Absolute 06/19/2017 0.0  0.0 - 0.1 K/uL Final  . Sodium 06/19/2017 136  135 - 145 mmol/L Final  . Potassium 06/19/2017 3.9  3.5 - 5.1 mmol/L Final  . Chloride 06/19/2017 104  101 - 111 mmol/L Final  . CO2 06/19/2017 26  22 - 32 mmol/L Final  . Glucose, Bld 06/19/2017 101* 65 - 99 mg/dL Final  . BUN 06/19/2017 18  6 - 20 mg/dL Final  . Creatinine, Ser 06/19/2017 1.17  0.61 - 1.24 mg/dL Final  . Calcium 06/19/2017 8.6* 8.9 - 10.3 mg/dL Final  . Total Protein 06/19/2017 6.1* 6.5 - 8.1 g/dL Final  . Albumin 06/19/2017 3.3* 3.5 - 5.0 g/dL Final  . AST 06/19/2017 27  15 - 41 U/L Final  . ALT 06/19/2017 20  17 - 63 U/L Final  . Alkaline  Phosphatase 06/19/2017 67  38 - 126 U/L Final  . Total Bilirubin 06/19/2017 0.4  0.3 - 1.2 mg/dL Final  . GFR calc non Af Amer 06/19/2017 55* >60 mL/min Final  . GFR calc Af Amer 06/19/2017 >60  >60 mL/min Final   Comment: (NOTE) The eGFR has been calculated using the CKD EPI equation. This calculation has not been validated in all clinical situations. eGFR's persistently <60 mL/min signify possible Chronic Kidney Disease.   Georgiann Hahn gap 06/19/2017 6  5 - 15 Final       Ardath Sax, MD

## 2017-07-03 ENCOUNTER — Encounter (HOSPITAL_COMMUNITY): Payer: Self-pay

## 2017-07-03 ENCOUNTER — Inpatient Hospital Stay (HOSPITAL_COMMUNITY): Payer: Medicare Other

## 2017-07-03 ENCOUNTER — Other Ambulatory Visit: Payer: Self-pay

## 2017-07-03 VITALS — BP 116/51 | HR 85 | Temp 97.5°F | Resp 18 | Wt 213.6 lb

## 2017-07-03 DIAGNOSIS — E538 Deficiency of other specified B group vitamins: Secondary | ICD-10-CM

## 2017-07-03 DIAGNOSIS — Z5112 Encounter for antineoplastic immunotherapy: Secondary | ICD-10-CM | POA: Diagnosis not present

## 2017-07-03 DIAGNOSIS — C3491 Malignant neoplasm of unspecified part of right bronchus or lung: Secondary | ICD-10-CM

## 2017-07-03 DIAGNOSIS — R7989 Other specified abnormal findings of blood chemistry: Secondary | ICD-10-CM

## 2017-07-03 DIAGNOSIS — E876 Hypokalemia: Secondary | ICD-10-CM

## 2017-07-03 LAB — CBC WITH DIFFERENTIAL/PLATELET
Basophils Absolute: 0 10*3/uL (ref 0.0–0.1)
Basophils Relative: 1 %
Eosinophils Absolute: 0.4 10*3/uL (ref 0.0–0.7)
Eosinophils Relative: 4 %
HCT: 33.4 % — ABNORMAL LOW (ref 39.0–52.0)
Hemoglobin: 11 g/dL — ABNORMAL LOW (ref 13.0–17.0)
Lymphocytes Relative: 19 %
Lymphs Abs: 1.6 10*3/uL (ref 0.7–4.0)
MCH: 29.6 pg (ref 26.0–34.0)
MCHC: 32.9 g/dL (ref 30.0–36.0)
MCV: 90 fL (ref 78.0–100.0)
Monocytes Absolute: 0.7 10*3/uL (ref 0.1–1.0)
Monocytes Relative: 9 %
Neutro Abs: 5.7 10*3/uL (ref 1.7–7.7)
Neutrophils Relative %: 67 %
Platelets: 245 10*3/uL (ref 150–400)
RBC: 3.71 MIL/uL — ABNORMAL LOW (ref 4.22–5.81)
RDW: 13.2 % (ref 11.5–15.5)
WBC: 8.4 10*3/uL (ref 4.0–10.5)

## 2017-07-03 LAB — COMPREHENSIVE METABOLIC PANEL
ALT: 15 U/L — ABNORMAL LOW (ref 17–63)
AST: 23 U/L (ref 15–41)
Albumin: 3.2 g/dL — ABNORMAL LOW (ref 3.5–5.0)
Alkaline Phosphatase: 69 U/L (ref 38–126)
Anion gap: 10 (ref 5–15)
BUN: 15 mg/dL (ref 6–20)
CO2: 24 mmol/L (ref 22–32)
Calcium: 8.9 mg/dL (ref 8.9–10.3)
Chloride: 101 mmol/L (ref 101–111)
Creatinine, Ser: 1.1 mg/dL (ref 0.61–1.24)
GFR calc Af Amer: >60 mL/min (ref >60)
GFR calc non Af Amer: 60 mL/min — ABNORMAL LOW (ref >60)
Glucose, Bld: 185 mg/dL — ABNORMAL HIGH (ref 65–99)
Potassium: 3.4 mmol/L — ABNORMAL LOW (ref 3.5–5.1)
Sodium: 135 mmol/L (ref 135–145)
Total Bilirubin: 0.6 mg/dL (ref 0.3–1.2)
Total Protein: 6 g/dL — ABNORMAL LOW (ref 6.5–8.1)

## 2017-07-03 LAB — T4, FREE: Free T4: 0.79 ng/dL (ref 0.61–1.12)

## 2017-07-03 LAB — TSH: TSH: 7.933 u[IU]/mL — ABNORMAL HIGH (ref 0.350–4.500)

## 2017-07-03 MED ORDER — CYANOCOBALAMIN 1000 MCG/ML IJ SOLN
INTRAMUSCULAR | Status: AC
  Filled 2017-07-03: qty 1

## 2017-07-03 MED ORDER — SODIUM CHLORIDE 0.9 % IV SOLN
980.0000 mg | Freq: Once | INTRAVENOUS | Status: AC
  Administered 2017-07-03: 980 mg via INTRAVENOUS
  Filled 2017-07-03: qty 10

## 2017-07-03 MED ORDER — CYANOCOBALAMIN 1000 MCG/ML IJ SOLN
1000.0000 ug | Freq: Once | INTRAMUSCULAR | Status: AC
  Administered 2017-07-03: 1000 ug via INTRAMUSCULAR

## 2017-07-03 MED ORDER — SODIUM CHLORIDE 0.9% FLUSH
10.0000 mL | INTRAVENOUS | Status: DC | PRN
  Administered 2017-07-03: 10 mL
  Filled 2017-07-03: qty 10

## 2017-07-03 MED ORDER — SODIUM CHLORIDE 0.9 % IV SOLN
Freq: Once | INTRAVENOUS | Status: AC
  Administered 2017-07-03: 11:00:00 via INTRAVENOUS

## 2017-07-03 MED ORDER — POTASSIUM CHLORIDE CRYS ER 20 MEQ PO TBCR
40.0000 meq | EXTENDED_RELEASE_TABLET | Freq: Once | ORAL | Status: AC
  Administered 2017-07-03: 40 meq via ORAL
  Filled 2017-07-03: qty 2

## 2017-07-03 MED ORDER — HEPARIN SOD (PORK) LOCK FLUSH 100 UNIT/ML IV SOLN
500.0000 [IU] | Freq: Once | INTRAVENOUS | Status: AC | PRN
  Administered 2017-07-03: 500 [IU]
  Filled 2017-07-03: qty 5

## 2017-07-03 NOTE — Patient Instructions (Signed)
[facility] Discharge Instructions for Patients Receiving Chemotherapy   Beginning January 23rd 2017 lab work for the Cancer Center will be done in the  Main lab at [facility] on 1st floor. If you have a lab appointment with the Cancer Center please come in thru the  Main Entrance and check in at the main information desk   Today you received the following chemotherapy agents   To help prevent nausea and vomiting after your treatment, we encourage you to take your nausea medication     If you develop nausea and vomiting, or diarrhea that is not controlled by your medication, call the clinic.  The clinic phone number is [phone]. Office hours are Monday-Friday 8:30am-5:00pm.  BELOW ARE SYMPTOMS THAT SHOULD BE REPORTED IMMEDIATELY:  *FEVER GREATER THAN 101.0 F  *CHILLS WITH OR WITHOUT FEVER  NAUSEA AND VOMITING THAT IS NOT CONTROLLED WITH YOUR NAUSEA MEDICATION  *UNUSUAL SHORTNESS OF BREATH  *UNUSUAL BRUISING OR BLEEDING  TENDERNESS IN MOUTH AND THROAT WITH OR WITHOUT PRESENCE OF ULCERS  *URINARY PROBLEMS  *BOWEL PROBLEMS  UNUSUAL RASH Items with * indicate a potential emergency and should be followed up as soon as possible. If you have an emergency after office hours please contact your primary care physician or go to the nearest emergency department.  Please call the clinic during office hours if you have any questions or concerns.   You may also contact the Patient Navigator at [phone] should you have any questions or need assistance in obtaining follow up care.      Resources For Cancer Patients and their Caregivers ? American Cancer Society: Can assist with transportation, wigs, general needs, runs Look Good Feel Better.        1-888-227-6333 ? Cancer Care: Provides financial assistance, online support groups, medication/co-pay assistance.  1-800-813-HOPE (4673) ? [facility] Assists [place] cancer  patients and their families through emotional , educational and financial support.  336-427-4357 ? [place] DSS Where to apply for food stamps, Medicaid and utility assistance. 336-342-1394 ? [facility]: Transportation to medical appointments. 336-347-2287 ? Social Security Administration: May apply for disability if have a Stage IV cancer. 336-342-7796 1-800-772-1213 ? [place] Aging, Disability and Transit Services: Assists with nutrition, care and transit needs. 336-349-2343         

## 2017-07-03 NOTE — Progress Notes (Signed)
[name] presents today for injection per MD orders. B12 1,018mcg administered IM in left Upper Arm. Administration without incident. Patient tolerated well.  Treatment given per orders. Patient tolerated it well without problems. Vitals stable and discharged home from clinic ambulatory. Follow up as scheduled.

## 2017-07-04 LAB — T3: T3, Total: 106 ng/dL (ref 71–180)

## 2017-07-17 ENCOUNTER — Other Ambulatory Visit: Payer: Self-pay

## 2017-07-17 ENCOUNTER — Inpatient Hospital Stay (HOSPITAL_COMMUNITY): Payer: Medicare Other | Attending: Hematology and Oncology

## 2017-07-17 ENCOUNTER — Encounter (HOSPITAL_COMMUNITY): Payer: Self-pay | Admitting: Internal Medicine

## 2017-07-17 ENCOUNTER — Inpatient Hospital Stay (HOSPITAL_BASED_OUTPATIENT_CLINIC_OR_DEPARTMENT_OTHER): Payer: Medicare Other | Admitting: Internal Medicine

## 2017-07-17 VITALS — BP 136/47 | HR 87 | Temp 98.6°F | Resp 20 | Wt 210.0 lb

## 2017-07-17 VITALS — BP 111/57 | HR 80 | Temp 97.7°F | Resp 18

## 2017-07-17 DIAGNOSIS — Z5112 Encounter for antineoplastic immunotherapy: Secondary | ICD-10-CM | POA: Insufficient documentation

## 2017-07-17 DIAGNOSIS — C342 Malignant neoplasm of middle lobe, bronchus or lung: Secondary | ICD-10-CM | POA: Diagnosis present

## 2017-07-17 DIAGNOSIS — Z79899 Other long term (current) drug therapy: Secondary | ICD-10-CM | POA: Diagnosis not present

## 2017-07-17 DIAGNOSIS — C3491 Malignant neoplasm of unspecified part of right bronchus or lung: Secondary | ICD-10-CM

## 2017-07-17 LAB — CBC WITH DIFFERENTIAL/PLATELET
Basophils Absolute: 0 10*3/uL (ref 0.0–0.1)
Basophils Relative: 0 %
Eosinophils Absolute: 1.1 10*3/uL — ABNORMAL HIGH (ref 0.0–0.7)
Eosinophils Relative: 13 %
HCT: 33.5 % — ABNORMAL LOW (ref 39.0–52.0)
Hemoglobin: 11.1 g/dL — ABNORMAL LOW (ref 13.0–17.0)
Lymphocytes Relative: 17 %
Lymphs Abs: 1.6 10*3/uL (ref 0.7–4.0)
MCH: 29.8 pg (ref 26.0–34.0)
MCHC: 33.1 g/dL (ref 30.0–36.0)
MCV: 89.8 fL (ref 78.0–100.0)
Monocytes Absolute: 0.8 10*3/uL (ref 0.1–1.0)
Monocytes Relative: 8 %
Neutro Abs: 5.4 10*3/uL (ref 1.7–7.7)
Neutrophils Relative %: 62 %
Platelets: 210 10*3/uL (ref 150–400)
RBC: 3.73 MIL/uL — ABNORMAL LOW (ref 4.22–5.81)
RDW: 13.2 % (ref 11.5–15.5)
WBC: 8.9 10*3/uL (ref 4.0–10.5)

## 2017-07-17 LAB — COMPREHENSIVE METABOLIC PANEL
ALT: 18 U/L (ref 17–63)
AST: 26 U/L (ref 15–41)
Albumin: 3.1 g/dL — ABNORMAL LOW (ref 3.5–5.0)
Alkaline Phosphatase: 58 U/L (ref 38–126)
Anion gap: 9 (ref 5–15)
BUN: 10 mg/dL (ref 6–20)
CO2: 24 mmol/L (ref 22–32)
Calcium: 8.4 mg/dL — ABNORMAL LOW (ref 8.9–10.3)
Chloride: 99 mmol/L — ABNORMAL LOW (ref 101–111)
Creatinine, Ser: 0.99 mg/dL (ref 0.61–1.24)
GFR calc Af Amer: >60 mL/min (ref >60)
GFR calc non Af Amer: >60 mL/min (ref >60)
Glucose, Bld: 218 mg/dL — ABNORMAL HIGH (ref 65–99)
Potassium: 3.6 mmol/L (ref 3.5–5.1)
Sodium: 132 mmol/L — ABNORMAL LOW (ref 135–145)
Total Bilirubin: 0.6 mg/dL (ref 0.3–1.2)
Total Protein: 5.9 g/dL — ABNORMAL LOW (ref 6.5–8.1)

## 2017-07-17 MED ORDER — SODIUM CHLORIDE 0.9 % IV SOLN
980.0000 mg | Freq: Once | INTRAVENOUS | Status: AC
  Administered 2017-07-17: 980 mg via INTRAVENOUS
  Filled 2017-07-17: qty 10

## 2017-07-17 MED ORDER — SODIUM CHLORIDE 0.9 % IV SOLN
Freq: Once | INTRAVENOUS | Status: AC
  Administered 2017-07-17: 13:00:00 via INTRAVENOUS

## 2017-07-17 MED ORDER — HEPARIN SOD (PORK) LOCK FLUSH 100 UNIT/ML IV SOLN
500.0000 [IU] | Freq: Once | INTRAVENOUS | Status: AC | PRN
  Administered 2017-07-17: 500 [IU]

## 2017-07-17 NOTE — Progress Notes (Signed)
Tolerated infusion w/o adverse reaction.  Alert, in no distress.  VSS.  Discharged ambulatory in c/o spouse.  

## 2017-07-28 NOTE — Progress Notes (Signed)
[facility] Follow-up Visit: F/u for- Adenocarcinoma Lung Cancer Staging Adenocarcinoma of right lung, stage 3 ([place]) Staging form: Lung, AJCC 7th Edition - Clinical stage from 12/09/2015: Stage IA (T1a, N0, M0) - Signed by Baird Cancer, PA-C on 12/09/2015 - Pathologic stage from 01/20/2016: Stage IIIA (T1a, N2, cM0) - Signed by Baird Cancer, PA-C on 02/01/2016  History of Presenting [place] 84 y.o. presenting to the [facility] for diagnosis of stage IIIa adenocarcinoma of the lung, receiving curative-intent immunotherapy maintenance with Imfinzi    Oncological/hematological History:   Adenocarcinoma of right lung, stage 3 ([place])   11/10/2015 Imaging    13 mm spiculated lesion seen in right middle lobe.      11/30/2015 PET scan    Spiculated right middle lobe nodule is mildly hypermetabolic and most consistent with a stage IA adenocarcinoma.      12/30/2015 Surgery    Right middle lobectomy and node dissection      12/30/2015 Procedure    Right video-assisted thoracoscopy, Thoracoscopic right middle lobectomy, Mediastinal lymph node dissection, and On-Q local anesthetic catheter placement.      01/02/2016 Pathology Results    1. Lung, resection (segmental or lobe), Right Middle Lobe - INVASIVE ADENOCARCINOMA, WELL DIFFERENTIATED, SPANNING 1.2 CM. - THE SURGICAL RESECTION MARGINS ARE NEGATIVE FOR CARCINOMA. 2. Lymph node, biopsy, Level 12 - METASTATIC CARCINOMA IN 1 OF 1 LYMPH NODE (1/1). 3. Lymph node, biopsy, Level 12 #2 - METASTATIC CARCINOMA IN 1 OF 1 LYMPH NODE (1/1). 4. Lymph node, biopsy, Level 7 - METASTATIC CARCINOMA IN 1 OF 1 LYMPH NODE (1/1/). 5. Lymph node, biopsy, Level 7 #2 - METASTATIC CARCINOMA IN 1 OF 1 LYMPH NODE (1/1). 6. Lymph node, biopsy, Level 7 #3 - METASTATIC CARCINOMA IN 1 OF 1 LYMPH NODE (1/1). 7. Lymph node, biopsy, Level 7 #4 - METASTATIC CARCINOMA IN 1 OF 1 LYMPH NODE (1/1). 8. Lymph node, biopsy, 4R - THERE IS  NO EVIDENCE OF CARCINOMA IN 1 OF 1 LYMPH NODE (0/1). 9. Lymph node, biopsy, 4R #2 - THERE IS NO EVIDENCE OF CARCINOMA IN 1 OF 1 LYMPH NODE (0/1).      01/05/2016 Pathology Results    PDL1 NEGATIVE- tumor proportion score of 0%.       01/05/2016 Pathology Results    Genomic alterations identified: BRAF V600E, KIT amplification, PDGFRA amplification, CDKN2A/B loss, TP53 S75f*33.  Additional findings: MSI-STABLE.  No reportable alterations identified: EGFR, KRAS, ALK, MET, RET, ERBB2, ROS1      02/09/2016 Procedure    Port placed by IR.      02/16/2016 - 05/31/2016 Chemotherapy    Carboplatin/Pemetrexed x 6 cycles        - 08/01/2016 Radiation Therapy    [place], [place]      08/28/2016 Imaging    CT CAP- No evidence of recurrent or metastatic carcinoma within the chest, abdomen, or pelvis.  Stable incidental findings include a absent, small benign right adrenal adenoma, sigmoid diverticulosis, and aortic atherosclerosis.      09/04/2016 -  Chemotherapy    Imfinzi immunotherapy up to 52 weeks.       12/07/2016 Imaging    CT C/A/P: New focal streaky opacities within the peripheral posterior right lower lobe and anteromedial left upper lobe- likely atelectasis with infection or other process is less likely. Recommend attention to these areas on future scans.  Small right pleural effusion, slightly increased from 08/28/2016. No evidence of pleural mass or enhancement.  No evidence of malignancy/ metastatic disease within the abdomen or pelvis.       02/12/2017 Imaging    CT chest: IMPRESSION: 1. Stable CT of the chest. No specific findings identified to suggest residual or recurrence of tumor. 2. Persistent right pleural effusion. 3. Paramediastinal radiation change predominantly involving the right lung is similar to previous study. 4. Stable right adrenal gland nodule and low-density foci within the liver. 5. Aortic Atherosclerosis (ICD10-I70.0) and Emphysema  (ICD10-J43.9). Multi vessel coronary artery calcifications noted.       Review of Systems: Review of Systems  Constitutional: Negative for appetite change, diaphoresis, fatigue and fever.  Respiratory: Negative for cough, hemoptysis and shortness of breath.   Cardiovascular: Negative for chest pain.  Gastrointestinal: Negative.   Musculoskeletal: Negative.   Psychiatric/Behavioral: Negative.   All other systems reviewed and are negative.    PHYSICAL EXAMINATION Blood pressure (!) 136/47, pulse 87, temperature 98.6 F (37 C), temperature source Oral, resp. rate 20, weight 210 lb (95.3 kg), SpO2 95 %.  ECOG PERFORMANCE STATUS: 1 - Symptomatic but completely ambulatory  Physical Exam  Constitutional: He is well-developed, well-nourished, and in no distress. No distress.  HENT:  Head: Normocephalic and atraumatic.  Mouth/Throat: Oropharynx is clear and moist. No oropharyngeal exudate.  Eyes: Conjunctivae and EOM are normal. Pupils are equal, round, and reactive to light. No scleral icterus.  Neck: No thyromegaly present.  Cardiovascular: Normal rate, regular rhythm and normal heart sounds.  No murmur heard. Pulmonary/Chest: Effort normal and breath sounds normal. No respiratory distress. He has no wheezes. He has no rales.  Abdominal: Soft. Bowel sounds are normal. He exhibits no distension and no mass. There is no tenderness. There is no guarding.  Lymphadenopathy:    He has no cervical adenopathy.  Skin: He is not diaphoretic.    Impression and plan:  Stage IIIA adenocarcinoma right lung. Diagnosed in 11/2015. Treated with RML lobectomy/VATS with mediastinal lymph node dissection. PDL-1 negative. Underwent chemotherapy with Carbo/Alimta x 6 cycles, completing chemo on 05/31/16. Also had chest radiation in [place] which completed on 08/01/16.  Maintenance Imfinzi initiated on 09/04/16 with plans to continue for total of 1 year.  He is tolerating it well with no problems.  Proceed  with infancy today.   He began Imfinzi on 09/04/2016- he will complete 12 months of therapy as of 07/31/2017  CBC CMP are stable TSH however was noted to be high on 1/23 with normal T3 and T4.  He is clinically euthyroid.  Repeat TSH and T3 and T4 at his next visit.  Return for last infusion on 07/31/2017   His next disease assessment with CT of the chest abdomen and pelvis is due in April 2019. MD visit to discuss surveillance plan in April 2019 after CTs  Continue B12 inj monthly     Creola Corn, MD

## 2017-07-30 ENCOUNTER — Other Ambulatory Visit (HOSPITAL_COMMUNITY): Payer: Self-pay | Admitting: *Deleted

## 2017-07-31 ENCOUNTER — Encounter: Payer: Self-pay | Admitting: Oncology

## 2017-07-31 ENCOUNTER — Encounter (HOSPITAL_COMMUNITY): Payer: Self-pay | Admitting: Adult Health

## 2017-07-31 ENCOUNTER — Encounter (HOSPITAL_COMMUNITY): Payer: Self-pay

## 2017-07-31 ENCOUNTER — Other Ambulatory Visit (HOSPITAL_COMMUNITY): Payer: Self-pay | Admitting: Adult Health

## 2017-07-31 ENCOUNTER — Inpatient Hospital Stay (HOSPITAL_COMMUNITY): Payer: Medicare Other

## 2017-07-31 ENCOUNTER — Ambulatory Visit (HOSPITAL_COMMUNITY): Payer: Medicare Other | Admitting: Oncology

## 2017-07-31 VITALS — BP 127/55 | HR 88 | Temp 98.2°F | Resp 18 | Wt 214.0 lb

## 2017-07-31 DIAGNOSIS — Z5112 Encounter for antineoplastic immunotherapy: Secondary | ICD-10-CM | POA: Diagnosis not present

## 2017-07-31 DIAGNOSIS — E538 Deficiency of other specified B group vitamins: Secondary | ICD-10-CM

## 2017-07-31 DIAGNOSIS — R7989 Other specified abnormal findings of blood chemistry: Secondary | ICD-10-CM

## 2017-07-31 DIAGNOSIS — C3491 Malignant neoplasm of unspecified part of right bronchus or lung: Secondary | ICD-10-CM

## 2017-07-31 LAB — COMPREHENSIVE METABOLIC PANEL
ALT: 19 U/L (ref 17–63)
AST: 29 U/L (ref 15–41)
Albumin: 3.2 g/dL — ABNORMAL LOW (ref 3.5–5.0)
Alkaline Phosphatase: 67 U/L (ref 38–126)
Anion gap: 10 (ref 5–15)
BUN: 14 mg/dL (ref 6–20)
CO2: 23 mmol/L (ref 22–32)
Calcium: 8.9 mg/dL (ref 8.9–10.3)
Chloride: 103 mmol/L (ref 101–111)
Creatinine, Ser: 1.06 mg/dL (ref 0.61–1.24)
GFR calc Af Amer: >60 mL/min (ref >60)
GFR calc non Af Amer: >60 mL/min (ref >60)
Glucose, Bld: 232 mg/dL — ABNORMAL HIGH (ref 65–99)
Potassium: 3.8 mmol/L (ref 3.5–5.1)
Sodium: 136 mmol/L (ref 135–145)
Total Bilirubin: 0.5 mg/dL (ref 0.3–1.2)
Total Protein: 6.1 g/dL — ABNORMAL LOW (ref 6.5–8.1)

## 2017-07-31 LAB — CBC WITH DIFFERENTIAL/PLATELET
Basophils Absolute: 0 10*3/uL (ref 0.0–0.1)
Basophils Relative: 1 %
Eosinophils Absolute: 0.8 10*3/uL — ABNORMAL HIGH (ref 0.0–0.7)
Eosinophils Relative: 11 %
HCT: 35.1 % — ABNORMAL LOW (ref 39.0–52.0)
Hemoglobin: 11.5 g/dL — ABNORMAL LOW (ref 13.0–17.0)
Lymphocytes Relative: 21 %
Lymphs Abs: 1.5 10*3/uL (ref 0.7–4.0)
MCH: 29.8 pg (ref 26.0–34.0)
MCHC: 32.8 g/dL (ref 30.0–36.0)
MCV: 90.9 fL (ref 78.0–100.0)
Monocytes Absolute: 0.5 10*3/uL (ref 0.1–1.0)
Monocytes Relative: 7 %
Neutro Abs: 4.4 10*3/uL (ref 1.7–7.7)
Neutrophils Relative %: 60 %
Platelets: 209 10*3/uL (ref 150–400)
RBC: 3.86 MIL/uL — ABNORMAL LOW (ref 4.22–5.81)
RDW: 13.2 % (ref 11.5–15.5)
WBC: 7.4 10*3/uL (ref 4.0–10.5)

## 2017-07-31 LAB — T4, FREE: Free T4: 1.02 ng/dL (ref 0.61–1.12)

## 2017-07-31 LAB — TSH: TSH: 6.339 u[IU]/mL — ABNORMAL HIGH (ref 0.350–4.500)

## 2017-07-31 MED ORDER — SODIUM CHLORIDE 0.9 % IV SOLN
Freq: Once | INTRAVENOUS | Status: AC
  Administered 2017-07-31: 11:00:00 via INTRAVENOUS

## 2017-07-31 MED ORDER — HEPARIN SOD (PORK) LOCK FLUSH 100 UNIT/ML IV SOLN
500.0000 [IU] | Freq: Once | INTRAVENOUS | Status: AC | PRN
  Administered 2017-07-31: 500 [IU]
  Filled 2017-07-31: qty 5

## 2017-07-31 MED ORDER — SODIUM CHLORIDE 0.9% FLUSH
10.0000 mL | INTRAVENOUS | Status: DC | PRN
  Administered 2017-07-31: 10 mL
  Filled 2017-07-31: qty 10

## 2017-07-31 MED ORDER — CYANOCOBALAMIN 1000 MCG/ML IJ SOLN
1000.0000 ug | Freq: Once | INTRAMUSCULAR | Status: AC
  Administered 2017-07-31: 1000 ug via INTRAMUSCULAR
  Filled 2017-07-31: qty 1

## 2017-07-31 MED ORDER — SODIUM CHLORIDE 0.9 % IV SOLN
980.0000 mg | Freq: Once | INTRAVENOUS | Status: AC
  Administered 2017-07-31: 980 mg via INTRAVENOUS
  Filled 2017-07-31: qty 10

## 2017-07-31 NOTE — Patient Instructions (Signed)
[facility] Discharge Instructions for Patients Receiving Chemotherapy   Beginning January 23rd 2017 lab work for the [facility] will be done in the  Main lab at [facility] on 1st floor. If you have a lab appointment with the [facility] please come in thru the  Main Entrance and check in at the main information desk   Today you received the following chemotherapy agents Imfinzi as well as Vit B12 injection. Follow-up as scheduled. Call clinic for any questions or concerns  To help prevent nausea and vomiting after your treatment, we encourage you to take your nausea medication   If you develop nausea and vomiting, or diarrhea that is not controlled by your medication, call the clinic.  The clinic phone number is [phone]. Office hours are Monday-Friday 8:30am-5:00pm.  BELOW ARE SYMPTOMS THAT SHOULD BE REPORTED IMMEDIATELY:  *FEVER GREATER THAN 101.0 F  *CHILLS WITH OR WITHOUT FEVER  NAUSEA AND VOMITING THAT IS NOT CONTROLLED WITH YOUR NAUSEA MEDICATION  *UNUSUAL SHORTNESS OF BREATH  *UNUSUAL BRUISING OR BLEEDING  TENDERNESS IN MOUTH AND THROAT WITH OR WITHOUT PRESENCE OF ULCERS  *URINARY PROBLEMS  *BOWEL PROBLEMS  UNUSUAL RASH Items with * indicate a potential emergency and should be followed up as soon as possible. If you have an emergency after office hours please contact your primary care physician or go to the nearest emergency department.  Please call the clinic during office hours if you have any questions or concerns.   You may also contact the Patient Navigator at [phone] should you have any questions or need assistance in obtaining follow up care.      Resources For Cancer Patients and their Caregivers ? American Cancer Society: Can assist with transportation, wigs, general needs, runs Look Good Feel Better.        903-413-6376 ? Cancer Care: Provides financial assistance, online support groups, medication/co-pay  assistance.  1-800-813-HOPE 430 209 0871) ? [place] Assists [place] cancer patients and their families through emotional , educational and financial support.  832-041-8142 ? [place] DSS Where to apply for food stamps, Medicaid and utility assistance. 551-669-0779 ? RCATS: Transportation to medical appointments. 628-337-9579 ? Social Security Administration: May apply for disability if have a Stage IV cancer. (786)[phone] ? [employer], Disability and Transit Services: Assists with nutrition, care and transit needs. 559-762-1678

## 2017-07-31 NOTE — Progress Notes (Signed)
[place] reviewed with Faythe Casa NP and pt approved for Imfinzi infusion today                                 [name] tolerated Imfinzi infusion well without complaints or incident. VSS upon discharge. Pt discharged self ambulatory using walker in satisfactory condition accompanied by his wife

## 2017-07-31 NOTE — Progress Notes (Signed)
Nursing with questions re: patient's Imfinzi plan of care/remaining number of cycles of Imfinzi and his follow-up plan.   Based on my chart review, today would have been cycle #24 of Imfinzi.  We will plan to complete total of 26 cycles to complete 1 year of consolidative therapy.    Patient currently scheduled to see Dr. Walden Field with his next cycle of treatment in 2 weeks (which will be cycle #25).  Will defer to her judgment if she feels the patient needs to be seen again after that visit and when she would like to obtain restaging CT imaging.     Mike Craze, NP [place] [phone]

## 2017-08-01 LAB — T3: T3, Total: 107 ng/dL (ref 71–180)

## 2017-08-14 ENCOUNTER — Encounter (HOSPITAL_COMMUNITY): Payer: Self-pay | Admitting: Internal Medicine

## 2017-08-14 ENCOUNTER — Inpatient Hospital Stay (HOSPITAL_BASED_OUTPATIENT_CLINIC_OR_DEPARTMENT_OTHER): Payer: Medicare Other | Admitting: Internal Medicine

## 2017-08-14 ENCOUNTER — Inpatient Hospital Stay (HOSPITAL_COMMUNITY): Payer: Medicare Other

## 2017-08-14 ENCOUNTER — Ambulatory Visit (HOSPITAL_COMMUNITY): Payer: Medicare Other | Admitting: Internal Medicine

## 2017-08-14 ENCOUNTER — Other Ambulatory Visit: Payer: Self-pay

## 2017-08-14 ENCOUNTER — Inpatient Hospital Stay (HOSPITAL_COMMUNITY): Payer: Medicare Other | Attending: Hematology and Oncology

## 2017-08-14 VITALS — BP 127/54 | HR 77 | Resp 18 | Ht 70.0 in | Wt 213.4 lb

## 2017-08-14 VITALS — BP 148/82 | HR 70 | Temp 97.4°F | Resp 18

## 2017-08-14 DIAGNOSIS — Z5112 Encounter for antineoplastic immunotherapy: Secondary | ICD-10-CM | POA: Diagnosis present

## 2017-08-14 DIAGNOSIS — Z9221 Personal history of antineoplastic chemotherapy: Secondary | ICD-10-CM | POA: Diagnosis not present

## 2017-08-14 DIAGNOSIS — C342 Malignant neoplasm of middle lobe, bronchus or lung: Secondary | ICD-10-CM

## 2017-08-14 DIAGNOSIS — Z902 Acquired absence of lung [part of]: Secondary | ICD-10-CM | POA: Diagnosis not present

## 2017-08-14 DIAGNOSIS — Z923 Personal history of irradiation: Secondary | ICD-10-CM

## 2017-08-14 DIAGNOSIS — Z79899 Other long term (current) drug therapy: Secondary | ICD-10-CM | POA: Diagnosis not present

## 2017-08-14 DIAGNOSIS — C3491 Malignant neoplasm of unspecified part of right bronchus or lung: Secondary | ICD-10-CM

## 2017-08-14 DIAGNOSIS — Z87891 Personal history of nicotine dependence: Secondary | ICD-10-CM

## 2017-08-14 LAB — CBC WITH DIFFERENTIAL/PLATELET
Basophils Absolute: 0 10*3/uL (ref 0.0–0.1)
Basophils Relative: 0 %
Eosinophils Absolute: 0.6 10*3/uL (ref 0.0–0.7)
Eosinophils Relative: 9 %
HCT: 36.2 % — ABNORMAL LOW (ref 39.0–52.0)
Hemoglobin: 11.9 g/dL — ABNORMAL LOW (ref 13.0–17.0)
Lymphocytes Relative: 25 %
Lymphs Abs: 1.7 10*3/uL (ref 0.7–4.0)
MCH: 29.5 pg (ref 26.0–34.0)
MCHC: 32.9 g/dL (ref 30.0–36.0)
MCV: 89.8 fL (ref 78.0–100.0)
Monocytes Absolute: 0.6 10*3/uL (ref 0.1–1.0)
Monocytes Relative: 9 %
Neutro Abs: 3.9 10*3/uL (ref 1.7–7.7)
Neutrophils Relative %: 57 %
Platelets: 219 10*3/uL (ref 150–400)
RBC: 4.03 MIL/uL — ABNORMAL LOW (ref 4.22–5.81)
RDW: 12.9 % (ref 11.5–15.5)
WBC: 6.8 10*3/uL (ref 4.0–10.5)

## 2017-08-14 LAB — COMPREHENSIVE METABOLIC PANEL
ALT: 18 U/L (ref 17–63)
AST: 21 U/L (ref 15–41)
Albumin: 3.2 g/dL — ABNORMAL LOW (ref 3.5–5.0)
Alkaline Phosphatase: 65 U/L (ref 38–126)
Anion gap: 9 (ref 5–15)
BUN: 12 mg/dL (ref 6–20)
CO2: 24 mmol/L (ref 22–32)
Calcium: 8.9 mg/dL (ref 8.9–10.3)
Chloride: 101 mmol/L (ref 101–111)
Creatinine, Ser: 1.09 mg/dL (ref 0.61–1.24)
GFR calc Af Amer: >60 mL/min (ref >60)
GFR calc non Af Amer: 60 mL/min — ABNORMAL LOW (ref >60)
Glucose, Bld: 238 mg/dL — ABNORMAL HIGH (ref 65–99)
Potassium: 3.6 mmol/L (ref 3.5–5.1)
Sodium: 134 mmol/L — ABNORMAL LOW (ref 135–145)
Total Bilirubin: 0.5 mg/dL (ref 0.3–1.2)
Total Protein: 6.2 g/dL — ABNORMAL LOW (ref 6.5–8.1)

## 2017-08-14 LAB — LACTATE DEHYDROGENASE: LDH: 132 U/L (ref 98–192)

## 2017-08-14 MED ORDER — SODIUM CHLORIDE 0.9 % IV SOLN
Freq: Once | INTRAVENOUS | Status: AC
  Administered 2017-08-14: 12:00:00 via INTRAVENOUS

## 2017-08-14 MED ORDER — SODIUM CHLORIDE 0.9 % IV SOLN
980.0000 mg | Freq: Once | INTRAVENOUS | Status: AC
  Administered 2017-08-14: 980 mg via INTRAVENOUS
  Filled 2017-08-14: qty 9.6

## 2017-08-14 MED ORDER — HEPARIN SOD (PORK) LOCK FLUSH 100 UNIT/ML IV SOLN
500.0000 [IU] | Freq: Once | INTRAVENOUS | Status: AC | PRN
  Administered 2017-08-14: 500 [IU]

## 2017-08-14 NOTE — Patient Instructions (Addendum)
[name] at [facility] Discharge Instructions   You were seen today by Dr. Zoila Shutter Your lab work looks good today so you will proceed with treatment You will have your treatment again in 2 weeks We will then set you up for scans again in April and you will follow up a few days after to review and discuss treatment plan moving forward.     Thank you for choosing [place] at [facility] to provide your oncology and hematology care.  To afford each patient quality time with our provider, please arrive at least 15 minutes before your scheduled appointment time.    If you have a lab appointment with the [facility] please come in thru the  Main Entrance and check in at the main information desk  You need to re-schedule your appointment should you arrive 10 or more minutes late.  We strive to give you quality time with our providers, and arriving late affects you and other patients whose appointments are after yours.  Also, if you no show three or more times for appointments you may be dismissed from the clinic at the providers discretion.     Again, thank you for choosing [facility].  Our hope is that these requests will decrease the amount of time that you wait before being seen by our physicians.       _____________________________________________________________  Should you have questions after your visit to [facility], please contact our office at [phone] between the hours of 8:30 a.m. and 4:30 p.m.  Voicemails left after 4:30 p.m. will not be returned until the following business day.  For prescription refill requests, have your pharmacy contact our office.       Resources For Cancer Patients and their Caregivers ? American Cancer Society: Can assist with transportation, wigs, general needs, runs Look Good Feel Better.        231-036-6792 ? Cancer Care: Provides financial assistance, online  support groups, medication/co-pay assistance.  1-800-813-HOPE (630)348-7036) ? [place] Assists [place] cancer patients and their families through emotional , educational and financial support.  858-816-7581 ? [place] DSS Where to apply for food stamps, Medicaid and utility assistance. (979)073-8130 ? RCATS: Transportation to medical appointments. 407-404-6690 ? Social Security Administration: May apply for disability if have a Stage IV cancer. 507-537-5482 3125373114 ? [employer], Disability and Transit Services: Assists with nutrition, care and transit needs. [place] Support Programs:   > Cancer Support Group  2nd Tuesday of the month 1pm-2pm, Journey Room   > Creative Journey  3rd Tuesday of the month 1130am-1pm, Journey Room

## 2017-08-14 NOTE — Progress Notes (Signed)
Tolerated infusion w/o adverse reaction.  Alert, in no distress.  VSS.  Discharged ambulatory in c/o spouse.  

## 2017-08-28 ENCOUNTER — Inpatient Hospital Stay (HOSPITAL_COMMUNITY): Payer: Medicare Other

## 2017-08-28 ENCOUNTER — Encounter (HOSPITAL_COMMUNITY): Payer: Self-pay

## 2017-08-28 VITALS — BP 111/55 | HR 70 | Temp 97.5°F | Resp 18 | Ht 70.0 in | Wt 211.3 lb

## 2017-08-28 DIAGNOSIS — C3491 Malignant neoplasm of unspecified part of right bronchus or lung: Secondary | ICD-10-CM

## 2017-08-28 DIAGNOSIS — Z5112 Encounter for antineoplastic immunotherapy: Secondary | ICD-10-CM | POA: Diagnosis not present

## 2017-08-28 DIAGNOSIS — R7989 Other specified abnormal findings of blood chemistry: Secondary | ICD-10-CM

## 2017-08-28 DIAGNOSIS — E538 Deficiency of other specified B group vitamins: Secondary | ICD-10-CM

## 2017-08-28 LAB — CBC WITH DIFFERENTIAL/PLATELET
Basophils Absolute: 0 K/uL (ref 0.0–0.1)
Basophils Relative: 1 %
Eosinophils Absolute: 0.4 K/uL (ref 0.0–0.7)
Eosinophils Relative: 6 %
HCT: 35.3 % — ABNORMAL LOW (ref 39.0–52.0)
Hemoglobin: 11.7 g/dL — ABNORMAL LOW (ref 13.0–17.0)
Lymphocytes Relative: 23 %
Lymphs Abs: 1.4 K/uL (ref 0.7–4.0)
MCH: 29.7 pg (ref 26.0–34.0)
MCHC: 33.1 g/dL (ref 30.0–36.0)
MCV: 89.6 fL (ref 78.0–100.0)
Monocytes Absolute: 0.6 K/uL (ref 0.1–1.0)
Monocytes Relative: 10 %
Neutro Abs: 3.8 K/uL (ref 1.7–7.7)
Neutrophils Relative %: 60 %
Platelets: 214 K/uL (ref 150–400)
RBC: 3.94 MIL/uL — ABNORMAL LOW (ref 4.22–5.81)
RDW: 12.9 % (ref 11.5–15.5)
WBC: 6.3 K/uL (ref 4.0–10.5)

## 2017-08-28 LAB — LACTATE DEHYDROGENASE: LDH: 127 U/L (ref 98–192)

## 2017-08-28 LAB — COMPREHENSIVE METABOLIC PANEL
ALT: 21 U/L (ref 17–63)
AST: 31 U/L (ref 15–41)
Albumin: 3.1 g/dL — ABNORMAL LOW (ref 3.5–5.0)
Alkaline Phosphatase: 64 U/L (ref 38–126)
Anion gap: 10 (ref 5–15)
BUN: 12 mg/dL (ref 6–20)
CO2: 22 mmol/L (ref 22–32)
Calcium: 8.5 mg/dL — ABNORMAL LOW (ref 8.9–10.3)
Chloride: 100 mmol/L — ABNORMAL LOW (ref 101–111)
Creatinine, Ser: 1.15 mg/dL (ref 0.61–1.24)
GFR calc Af Amer: >60 mL/min (ref >60)
GFR calc non Af Amer: 56 mL/min — ABNORMAL LOW (ref >60)
Glucose, Bld: 272 mg/dL — ABNORMAL HIGH (ref 65–99)
Potassium: 3.5 mmol/L (ref 3.5–5.1)
Sodium: 132 mmol/L — ABNORMAL LOW (ref 135–145)
Total Bilirubin: 0.7 mg/dL (ref 0.3–1.2)
Total Protein: 6.1 g/dL — ABNORMAL LOW (ref 6.5–8.1)

## 2017-08-28 LAB — T4, FREE: Free T4: 0.95 ng/dL (ref 0.61–1.12)

## 2017-08-28 MED ORDER — SODIUM CHLORIDE 0.9 % IV SOLN
Freq: Once | INTRAVENOUS | Status: AC
  Administered 2017-08-28: 500 mL via INTRAVENOUS

## 2017-08-28 MED ORDER — SODIUM CHLORIDE 0.9% FLUSH
10.0000 mL | INTRAVENOUS | Status: DC | PRN
  Administered 2017-08-28: 10 mL
  Filled 2017-08-28: qty 10

## 2017-08-28 MED ORDER — CYANOCOBALAMIN 1000 MCG/ML IJ SOLN
1000.0000 ug | Freq: Once | INTRAMUSCULAR | Status: AC
  Administered 2017-08-28: 1000 ug via INTRAMUSCULAR
  Filled 2017-08-28: qty 1

## 2017-08-28 MED ORDER — HEPARIN SOD (PORK) LOCK FLUSH 100 UNIT/ML IV SOLN
500.0000 [IU] | Freq: Once | INTRAVENOUS | Status: AC | PRN
  Administered 2017-08-28: 500 [IU]

## 2017-08-28 MED ORDER — SODIUM CHLORIDE 0.9 % IV SOLN
10.5000 mg/kg | Freq: Once | INTRAVENOUS | Status: AC
  Administered 2017-08-28: 980 mg via INTRAVENOUS
  Filled 2017-08-28: qty 10

## 2017-08-28 NOTE — Progress Notes (Signed)
Patient for treatment today.  No changes in neuropathy for fingers and toes.  Able to perform ADL's without difficulty and denied trips, stumbles, or falls.    Labs reviewed with Dr. Walden Field and ok to treat today.    Vitamin b12 given with no complaints voiced.  Injection site clean and dry.  Band aid applied.    Patient tolerated treatment with no complaints voiced.  Port site clean and dry with no bruising or swelling noted at site.  No complaints of pain with flush.  Band aid applied.  VSS with discharge and left ambulatory with wife with no s/s of distress noted.

## 2017-08-28 NOTE — Patient Instructions (Signed)
[place] Discharge Instructions for Patients Receiving Chemotherapy  Today you received the following chemotherapy agents infinzi.     If you develop nausea and vomiting that is not controlled by your nausea medication, call the clinic.   BELOW ARE SYMPTOMS THAT SHOULD BE REPORTED IMMEDIATELY:  *FEVER GREATER THAN 100.5 F  *CHILLS WITH OR WITHOUT FEVER  NAUSEA AND VOMITING THAT IS NOT CONTROLLED WITH YOUR NAUSEA MEDICATION  *UNUSUAL SHORTNESS OF BREATH  *UNUSUAL BRUISING OR BLEEDING  TENDERNESS IN MOUTH AND THROAT WITH OR WITHOUT PRESENCE OF ULCERS  *URINARY PROBLEMS  *BOWEL PROBLEMS  UNUSUAL RASH Items with * indicate a potential emergency and should be followed up as soon as possible.  Feel free to call the clinic should you have any questions or concerns. The clinic phone number is [phone].  Please show the [place] at check-in to the Emergency Department and triage nurse.

## 2017-08-29 LAB — T3: T3, Total: 119 ng/dL (ref 71–180)

## 2017-09-02 ENCOUNTER — Emergency Department (HOSPITAL_COMMUNITY): Payer: Medicare Other

## 2017-09-02 ENCOUNTER — Encounter (HOSPITAL_COMMUNITY): Payer: Self-pay | Admitting: *Deleted

## 2017-09-02 ENCOUNTER — Emergency Department (HOSPITAL_COMMUNITY)
Admission: EM | Admit: 2017-09-02 | Discharge: 2017-09-02 | Disposition: A | Discharge status: Home / Self Care | Payer: Medicare Other | Attending: Emergency Medicine | Admitting: Emergency Medicine

## 2017-09-02 ENCOUNTER — Other Ambulatory Visit: Payer: Self-pay

## 2017-09-02 DIAGNOSIS — Z79899 Other long term (current) drug therapy: Secondary | ICD-10-CM | POA: Insufficient documentation

## 2017-09-02 DIAGNOSIS — S0990XA Unspecified injury of head, initial encounter: Secondary | ICD-10-CM | POA: Diagnosis not present

## 2017-09-02 DIAGNOSIS — S80211A Abrasion, right knee, initial encounter: Secondary | ICD-10-CM | POA: Diagnosis not present

## 2017-09-02 DIAGNOSIS — W19XXXA Unspecified fall, initial encounter: Secondary | ICD-10-CM | POA: Diagnosis not present

## 2017-09-02 DIAGNOSIS — Y929 Unspecified place or not applicable: Secondary | ICD-10-CM | POA: Insufficient documentation

## 2017-09-02 DIAGNOSIS — Z87891 Personal history of nicotine dependence: Secondary | ICD-10-CM | POA: Diagnosis not present

## 2017-09-02 DIAGNOSIS — Y939 Activity, unspecified: Secondary | ICD-10-CM | POA: Diagnosis not present

## 2017-09-02 DIAGNOSIS — E119 Type 2 diabetes mellitus without complications: Secondary | ICD-10-CM | POA: Insufficient documentation

## 2017-09-02 DIAGNOSIS — Z7982 Long term (current) use of aspirin: Secondary | ICD-10-CM | POA: Diagnosis not present

## 2017-09-02 DIAGNOSIS — R079 Chest pain, unspecified: Secondary | ICD-10-CM | POA: Insufficient documentation

## 2017-09-02 DIAGNOSIS — Z23 Encounter for immunization: Secondary | ICD-10-CM | POA: Diagnosis not present

## 2017-09-02 DIAGNOSIS — S8991XA Unspecified injury of right lower leg, initial encounter: Secondary | ICD-10-CM | POA: Diagnosis present

## 2017-09-02 DIAGNOSIS — Y999 Unspecified external cause status: Secondary | ICD-10-CM | POA: Insufficient documentation

## 2017-09-02 MED ORDER — TETANUS-DIPHTH-ACELL PERTUSSIS 5-2.5-18.5 LF-MCG/0.5 IM SUSP
0.5000 mL | Freq: Once | INTRAMUSCULAR | Status: AC
  Administered 2017-09-02: 0.5 mL via INTRAMUSCULAR
  Filled 2017-09-02: qty 0.5

## 2017-09-02 NOTE — ED Provider Notes (Signed)
[facility] EMERGENCY DEPARTMENT Provider Note   CSN: [id number] Arrival date & time: 09/02/17  1135     History   Chief Complaint Chief Complaint  Patient presents with  . Fall    HPI [name] is a 84 y.o. male.  Patient states that a few days ago he fell on his right chest is not sure if he hit his head.    The history is provided by the patient. No language interpreter was used.  Fall  This is a new problem. The current episode started more than 2 days ago. The problem occurs rarely. The problem has been resolved. Pertinent negatives include no chest pain, no abdominal pain and no headaches. Exacerbated by: Movement. Nothing relieves the symptoms. He has tried nothing for the symptoms. The treatment provided no relief.    Past Medical History:  Diagnosis Date  . Adenocarcinoma of lung, stage 3 (HCC)    Stage IIIA  . Adenocarcinoma of right lung, stage 3 ([place]) 12/02/2015  . Arthritis   . Atrial fibrillation, transient ([place])    transient postop, < 24 hours  . B12 deficiency   . Cyst of scrotum   . Diverticulitis   . GERD (gastroesophageal reflux disease)   . Hernia   . History of pneumonia   . Hyperlipidemia   . Lung nodule    Spiculated right middle lobe nodule, hypermetabolic  . Type 2 diabetes mellitus [facility])     Patient Active Problem List   Diagnosis Date Noted  . Atrial fibrillation, transient ([place]) 01/20/2016  . S/P lobectomy of lung 12/30/2015  . History of tobacco abuse 12/14/2015  . B12 deficiency 12/08/2015  . History of colonic polyps 12/02/2015  . Adenocarcinoma of right lung, stage 3 ([place]) 12/02/2015  . High cholesterol 11/03/2015  . Dysphagia, pharyngoesophageal phase 07/30/2012  . Diabetes ([place]) 07/30/2012    Past Surgical History:  Procedure Laterality Date  . BACK SURGERY    . CHOLECYSTECTOMY    . COLONOSCOPY N/A 11/24/2015   Procedure: COLONOSCOPY;  Surgeon: Rogene Houston, MD;  Location: [facility];  Service: Endoscopy;   Laterality: N/A;  730  . COLONOSCOPY W/ POLYPECTOMY     x 5  . CYST EXCISION     Scrotum  . EYE SURGERY Bilateral    Cataract with Lens  . HERNIA REPAIR Right    Inguinal  . IR GENERIC HISTORICAL  02/09/2016   IR US GUIDE VASC ACCESS RIGHT 02/09/2016 Arne Cleveland, MD [facility]-INTERV RAD  . IR GENERIC HISTORICAL  02/09/2016   IR FLUORO GUIDE CV LINE RIGHT 02/09/2016 Arne Cleveland, MD [facility]INTER[facility]  . LUMBAR DISC SURGERY     per patient report  . PORTACATH PLACEMENT    . VIDEO ASSISTED THORACOSCOPY (VATS)/ LOBECTOMY Right 12/30/2015   Procedure: VIDEO ASSISTED THORACOSCOPY (VATS)/RIGHT MIDDLE LOBECTOMY;  Surgeon: Melrose Nakayama, MD;  Location: [facility];  Service: Thoracic;  Laterality: Right;        Home Medications    Prior to Admission medications   Medication Sig Start Date End Date Taking? Authorizing Provider  ALPRAZolam Duanne Moron) 0.5 MG tablet Take 0.5 mg by mouth daily.   Yes [provider]  aspirin EC 81 MG tablet Take 81 mg by mouth daily.   Yes [provider]  calcium carbonate (OS-CAL - DOSED IN MG OF ELEMENTAL CALCIUM) 1250 (500 Ca) MG tablet Take 1 tablet (500 mg of elemental calcium total) by mouth daily with breakfast. 04/19/16  Yes Kefalas,  Manon Hilding, PA-C  Cholecalciferol (VITAMIN D) 2000 UNITS CAPS Take 1 capsule by mouth daily.    Yes [provider]  Cyanocobalamin (VITAMIN B-12 IJ) Inject 1 Dose as directed every 30 (thirty) days.   Yes [provider]  Durvalumab (IMFINZI IV) Inject into the vein.   Yes [provider]  glipiZIDE (GLUCOTROL) 5 MG tablet Take 1 tablet (5 mg total) by mouth 2 (two) times daily before a meal. Patient taking differently: Take 2.5 mg by mouth 2 (two) times daily before a meal.  01/05/16  Yes Gold, Wayne E, PA-C  lidocaine-prilocaine (EMLA) cream Apply a quarter size amount to port site 1 hour prior to chemo. Do not rub in. Cover with plastic wrap. 02/01/16  Yes Penland, Kelby Fam, MD  lisinopril  (PRINIVIL,ZESTRIL) 40 MG tablet Take 1 tablet (40 mg total) by mouth daily. 01/05/16  Yes Gold, Wayne E, PA-C  loratadine (CLARITIN) 10 MG tablet Take 10 mg by mouth daily.   Yes [provider]  meclizine (ANTIVERT) 25 MG tablet Take 25 mg by mouth 2 (two) times daily.    Yes [provider]  pantoprazole (PROTONIX) 40 MG tablet Take 40 mg by mouth 2 (two) times daily.    Yes [provider]  simvastatin (ZOCOR) 40 MG tablet Take 40 mg by mouth at bedtime.    Yes [provider]  terazosin (HYTRIN) 5 MG capsule Take 5 mg by mouth at bedtime.   Yes [provider]  tiZANidine (ZANAFLEX) 4 MG tablet Take 4 mg by mouth at bedtime.   Yes [provider]  ondansetron (ZOFRAN) 8 MG tablet Take 1 tablet (8 mg total) by mouth every 8 (eight) hours as needed for nausea or vomiting. 02/01/16   Penland, Kelby Fam, MD  prochlorperazine (COMPAZINE) 10 MG tablet TAKE ONE TABLET BY MOUTH EVERY 6 HOURS AS NEEDED FOR NAUSEA AND VOMITING 01/31/17   Holley Bouche, NP    Family History Family History  Problem Relation Age of Onset  . Colon cancer Brother     Social History Social History   Tobacco Use  . Smoking status: Former Smoker    Types: Cigarettes    Last attempt to quit: 05/27/2004    Years since quitting: 13.2  . Smokeless tobacco: Never Used  Substance Use Topics  . Alcohol use: No    Alcohol/week: 0.0 oz  . Drug use: No     Allergies   Morphine and related   Review of Systems Review of Systems  Constitutional: Negative for appetite change and fatigue.  HENT: Negative for congestion, ear discharge and sinus pressure.   Eyes: Negative for discharge.  Respiratory: Negative for cough.        Chest pain  Cardiovascular: Negative for chest pain.  Gastrointestinal: Negative for abdominal pain and diarrhea.  Genitourinary: Negative for frequency and hematuria.  Musculoskeletal: Negative for back pain.  Skin: Negative for rash.    Neurological: Negative for seizures and headaches.  Psychiatric/Behavioral: Negative for hallucinations.     Physical Exam Updated Vital Signs BP (!) 141/68   Pulse 95   Temp 98.7 F (37.1 C) (Oral)   Resp 18   Ht 5\' 10"  (1.778 m)   Wt 96.2 kg (212 lb)   SpO2 92%   BMI 30.42 kg/m   Physical Exam  Constitutional: He is oriented to person, place, and time. He appears well-developed.  HENT:  Head: Normocephalic.  Eyes: Conjunctivae and EOM are normal. No scleral  icterus.  Neck: Neck supple. No thyromegaly present.  Cardiovascular: Normal rate and regular rhythm. Exam reveals no gallop and no friction rub.  No murmur heard. Pulmonary/Chest: No stridor. He has no wheezes. He has no rales. He exhibits no tenderness.  Tenderness to right lateral chest  Abdominal: He exhibits no distension. There is no tenderness. There is no rebound.  Musculoskeletal: Normal range of motion. He exhibits no edema.  Abrasion to right knee  Lymphadenopathy:    He has no cervical adenopathy.  Neurological: He is oriented to person, place, and time. He exhibits normal muscle tone. Coordination normal.  Skin: No rash noted. No erythema.  Psychiatric: He has a normal mood and affect. His behavior is normal.     ED Treatments / Results  Labs (all labs ordered are listed, but only abnormal results are displayed) Labs Reviewed - No data to display  EKG None  Radiology Dg Ribs Unilateral W/chest Right  Result Date: 09/02/2017 CLINICAL DATA:  Right rib pain after fall yesterday. EXAM: RIGHT RIBS AND CHEST - 3+ VIEW COMPARISON:  Radiographs of March 27, 2016. FINDINGS: No fracture or other bone lesions are seen involving the ribs. There is no evidence of pneumothorax or pleural effusion. Both lungs are clear. Right internal jugular Port-A-Cath is noted with distal tip in expected position of cavoatrial junction. Heart size and mediastinal contours are within normal limits. IMPRESSION: Normal right  ribs.  No acute cardiopulmonary abnormality seen. Electronically Signed   By: Marijo Conception, M.D.   On: 09/02/2017 15:15   Ct Head Wo Contrast  Result Date: 09/02/2017 CLINICAL DATA:  Fall first yesterday with chest pain. History of lung cancer. Initial encounter. EXAM: CT HEAD WITHOUT CONTRAST CT CERVICAL SPINE WITHOUT CONTRAST TECHNIQUE: Multidetector CT imaging of the head and cervical spine was performed following the standard protocol without intravenous contrast. Multiplanar CT image reconstructions of the cervical spine were also generated. COMPARISON:  Brain MRI report 06/21/2016 FINDINGS: CT HEAD FINDINGS Brain: No evidence of acute infarction, hemorrhage, hydrocephalus, extra-axial collection or mass lesion/mass effect. Mild for age atrophy without specific pattern Vascular: No hyperdense vessel or unexpected calcification. Skull: Negative for fracture Sinuses/Orbits: No evidence of injury. Bilateral cataract resection. CT CERVICAL SPINE FINDINGS Alignment: Normal alignment. Skull base and vertebrae: Negative for fracture. Soft tissues and spinal canal: No prevertebral fluid or swelling. No visible canal hematoma. Disc levels: Diffuse spondylosis. C5-6 and C6-7 predominant degenerative disc narrowing and spurring with biforaminal impingement. Upper chest: There is a subcutaneous mass in the upper right back that was cold on 2017 PET-CT, compatible dermal inclusion cyst. IMPRESSION: No evidence of acute intracranial or cervical spine injury. Electronically Signed   By: Monte Fantasia M.D.   On: 09/02/2017 14:58   Ct Cervical Spine Wo Contrast  Result Date: 09/02/2017 CLINICAL DATA:  Fall first yesterday with chest pain. History of lung cancer. Initial encounter. EXAM: CT HEAD WITHOUT CONTRAST CT CERVICAL SPINE WITHOUT CONTRAST TECHNIQUE: Multidetector CT imaging of the head and cervical spine was performed following the standard protocol without intravenous contrast. Multiplanar CT image  reconstructions of the cervical spine were also generated. COMPARISON:  Brain MRI report 06/21/2016 FINDINGS: CT HEAD FINDINGS Brain: No evidence of acute infarction, hemorrhage, hydrocephalus, extra-axial collection or mass lesion/mass effect. Mild for age atrophy without specific pattern Vascular: No hyperdense vessel or unexpected calcification. Skull: Negative for fracture Sinuses/Orbits: No evidence of injury. Bilateral cataract resection. CT CERVICAL SPINE FINDINGS Alignment: Normal alignment. Skull base and vertebrae: Negative for  fracture. Soft tissues and spinal canal: No prevertebral fluid or swelling. No visible canal hematoma. Disc levels: Diffuse spondylosis. C5-6 and C6-7 predominant degenerative disc narrowing and spurring with biforaminal impingement. Upper chest: There is a subcutaneous mass in the upper right back that was cold on 2017 PET-CT, compatible dermal inclusion cyst. IMPRESSION: No evidence of acute intracranial or cervical spine injury. Electronically Signed   By: Monte Fantasia M.D.   On: 09/02/2017 14:58   Dg Knee Complete 4 Views Right  Result Date: 09/02/2017 CLINICAL DATA:  [place] on 09/01/2017 doing yard work, bruise over RIGHT patella EXAM: RIGHT KNEE - COMPLETE 4+ VIEW COMPARISON:  None FINDINGS: Osseous mineralization appears mildly decreased. Mild medial compartment joint space narrowing. No acute fracture, dislocation or bone destruction. No knee joint effusion. IMPRESSION: No acute osseous abnormalities. Electronically Signed   By: Lavonia Dana M.D.   On: 09/02/2017 15:15    Procedures Procedures (including critical care time)  Medications Ordered in ED Medications - No data to display   Initial Impression / Assessment and Plan / ED Course  I have reviewed the triage vital signs and the nursing notes.  Pertinent labs & imaging results that were available during my care of the patient were reviewed by me and considered in my medical decision making (see chart  for details).     CT the head and neck along with plain films of the chest and knee did not show anything acute.  Patient had a fall with contusion to chest and contusion to right knee.  He will take Tylenol Motrin follow-up with his PCP  Final Clinical Impressions(s) / ED Diagnoses   Final diagnoses:  Fall, initial encounter    ED Discharge Orders    None       Milton Ferguson, MD 09/02/17 1526

## 2017-09-02 NOTE — Discharge Instructions (Signed)
Tylenol or Motrin for pain.  Follow-up with your doctor if problems

## 2017-09-02 NOTE — ED Notes (Signed)
Pt returned from xray

## 2017-09-02 NOTE — ED Notes (Signed)
Pt transported to radiology.

## 2017-09-02 NOTE — ED Triage Notes (Signed)
Pt c/o fall yesterday face first with pain to right chest and around portacath. Denies LOC.

## 2017-09-02 NOTE — ED Notes (Signed)
EDP at bedside  

## 2017-09-09 NOTE — Progress Notes (Signed)
Diagnosis Adenocarcinoma of right lung, stage 3 (HCC) - Plan: CBC with Differential/Platelet, Comprehensive metabolic panel, Lactate dehydrogenase, DISCONTINUED: heparin lock flush 100 unit/mL, DISCONTINUED: 0.9 %  sodium chloride infusion, DISCONTINUED: durvalumab (IMFINZI) 920 mg in sodium chloride 0.9 % 100 mL chemo infusion  Staging Cancer Staging Adenocarcinoma of right lung, stage 3 (HCC) Staging form: Lung, AJCC 7th Edition - Clinical stage from 12/09/2015: Stage IA (T1a, N0, M0) - Signed by Baird Cancer, PA-C on 12/09/2015 - Pathologic stage from 01/20/2016: Stage IIIA (T1a, N2, cM0) - Signed by Baird Cancer, PA-C on 02/01/2016   Assessment and Plan:  1.  Stage IIIA adenocarcinoma right lung. Diagnosed in 11/2015. Treated with RML lobectomy/VATS with mediastinal lymph node dissection. PDL-1 negative. Underwent chemotherapy with Carbo/Alimta x 6 cycles, completing chemo on 05/31/16. Also had chest radiation in [place] which completed on 08/01/16.  Maintenance Imfinzi initiated on 09/04/16 with plans to continue for total of 1 year.  He is tolerating it well with no problems.   CBC CMP adequate for chemotherapy.    Pt planned for CT of the chest abdomen and pelvis in April 2019.  Will discuss with pharmacy when year of therapy is scheduled to be completed.    Current Status:  Pt here for follow-up prior to [place].      Adenocarcinoma of right lung, stage 3 (HCC)   11/10/2015 Imaging    13 mm spiculated lesion seen in right middle lobe.      11/30/2015 PET scan    Spiculated right middle lobe nodule is mildly hypermetabolic and most consistent with a stage IA adenocarcinoma.      12/30/2015 Surgery    Right middle lobectomy and node dissection      12/30/2015 Procedure    Right video-assisted thoracoscopy, Thoracoscopic right middle lobectomy, Mediastinal lymph node dissection, and On-Q local anesthetic catheter placement.      01/02/2016 Pathology Results    1. Lung,  resection (segmental or lobe), Right Middle Lobe - INVASIVE ADENOCARCINOMA, WELL DIFFERENTIATED, SPANNING 1.2 CM. - THE SURGICAL RESECTION MARGINS ARE NEGATIVE FOR CARCINOMA. 2. Lymph node, biopsy, Level 12 - METASTATIC CARCINOMA IN 1 OF 1 LYMPH NODE (1/1). 3. Lymph node, biopsy, Level 12 #2 - METASTATIC CARCINOMA IN 1 OF 1 LYMPH NODE (1/1). 4. Lymph node, biopsy, Level 7 - METASTATIC CARCINOMA IN 1 OF 1 LYMPH NODE (1/1/). 5. Lymph node, biopsy, Level 7 #2 - METASTATIC CARCINOMA IN 1 OF 1 LYMPH NODE (1/1). 6. Lymph node, biopsy, Level 7 #3 - METASTATIC CARCINOMA IN 1 OF 1 LYMPH NODE (1/1). 7. Lymph node, biopsy, Level 7 #4 - METASTATIC CARCINOMA IN 1 OF 1 LYMPH NODE (1/1). 8. Lymph node, biopsy, 4R - THERE IS NO EVIDENCE OF CARCINOMA IN 1 OF 1 LYMPH NODE (0/1). 9. Lymph node, biopsy, 4R #2 - THERE IS NO EVIDENCE OF CARCINOMA IN 1 OF 1 LYMPH NODE (0/1).      01/05/2016 Pathology Results    PDL1 NEGATIVE- tumor proportion score of 0%.       01/05/2016 Pathology Results    Genomic alterations identified: BRAF V600E, KIT amplification, PDGFRA amplification, CDKN2A/B loss, TP53 S52f*33.  Additional findings: MSI-STABLE.  No reportable alterations identified: EGFR, KRAS, ALK, MET, RET, ERBB2, ROS1      02/09/2016 Procedure    Port placed by IR.      02/16/2016 - 05/31/2016 Chemotherapy    Carboplatin/Pemetrexed x 6 cycles        - 08/01/2016 Radiation Therapy  [place], [place]      08/28/2016 Imaging    CT CAP- No evidence of recurrent or metastatic carcinoma within the chest, abdomen, or pelvis.  Stable incidental findings include a absent, small benign right adrenal adenoma, sigmoid diverticulosis, and aortic atherosclerosis.      09/04/2016 -  Chemotherapy    Imfinzi immunotherapy up to 52 weeks.       12/07/2016 Imaging    CT C/A/P: New focal streaky opacities within the peripheral posterior right lower lobe and anteromedial left upper lobe- likely atelectasis  with infection or other process is less likely. Recommend attention to these areas on future scans.  Small right pleural effusion, slightly increased from 08/28/2016. No evidence of pleural mass or enhancement.  No evidence of malignancy/ metastatic disease within the abdomen or pelvis.       02/12/2017 Imaging    CT chest: IMPRESSION: 1. Stable CT of the chest. No specific findings identified to suggest residual or recurrence of tumor. 2. Persistent right pleural effusion. 3. Paramediastinal radiation change predominantly involving the right lung is similar to previous study. 4. Stable right adrenal gland nodule and low-density foci within the liver. 5. Aortic Atherosclerosis (ICD10-I70.0) and Emphysema (ICD10-J43.9). Multi vessel coronary artery calcifications noted.        Problem List Patient Active Problem List   Diagnosis Date Noted  . Atrial fibrillation, transient ([place]) [I48.91] 01/20/2016  . S/P lobectomy of lung [Z90.2] 12/30/2015  . History of tobacco abuse [Z87.891] 12/14/2015  . B12 deficiency [E53.8] 12/08/2015  . History of colonic polyps [Z86.010] 12/02/2015  . Adenocarcinoma of right lung, stage 3 ([place]) [C34.91] 12/02/2015  . High cholesterol [E78.00] 11/03/2015  . Dysphagia, pharyngoesophageal phase [R13.14] 07/30/2012  . Diabetes ([place]) [E11.9] 07/30/2012    Past Medical History Past Medical History:  Diagnosis Date  . Adenocarcinoma of lung, stage 3 (HCC)    Stage IIIA  . Adenocarcinoma of right lung, stage 3 ([place]) 12/02/2015  . Arthritis   . Atrial fibrillation, transient ([place])    transient postop, < 24 hours  . B12 deficiency   . Cyst of scrotum   . Diverticulitis   . GERD (gastroesophageal reflux disease)   . Hernia   . History of pneumonia   . Hyperlipidemia   . Lung nodule    Spiculated right middle lobe nodule, hypermetabolic  . Type 2 diabetes mellitus [facility])     Past Surgical History Past Surgical History:  Procedure  Laterality Date  . BACK SURGERY    . CHOLECYSTECTOMY    . COLONOSCOPY N/A 11/24/2015   Procedure: COLONOSCOPY;  Surgeon: Rogene Houston, MD;  Location[facility];  Service: Endoscopy;  Laterality: N/A;  730  . COLONOSCOPY W/ POLYPECTOMY     x 5  . CYST EXCISION     Scrotum  . EYE SURGERY Bilateral    Cataract with Lens  . HERNIA REPAIR Right    Inguinal  . IR GENERIC HISTORICAL  02/09/2016   IR US GUIDE VASC ACCESS RIGHT 02/09/2016 Arne Cleveland, MD [facility]-INTER[facility] RAD  . IR GENERIC HISTORICAL  02/09/2016   IR FLUORO GUIDE CV LINE RIGHT 02/09/2016 Arne Cleveland, MD [facility]  . LUMBAR DISC SURGERY     per patient report  . PORTACATH PLACEMENT    . VIDEO ASSISTED THORACOSCOPY (VATS)/ LOBECTOMY Right 12/30/2015   Procedure: VIDEO ASSISTED THORACOSCOPY (VATS)/RIGHT MIDDLE LOBECTOMY;  Surgeon: Melrose Nakayama, MD;  Location: [facility];  Service: Thoracic;  Laterality: Right;    Family  History Family History  Problem Relation Age of Onset  . Colon cancer Brother      Social History  reports that he quit smoking about 13 years ago. His smoking use included cigarettes. He has never used smokeless tobacco. He reports that he does not drink alcohol or use drugs.  Medications  Current Outpatient Medications:  .  ALPRAZolam (XANAX) 0.5 MG tablet, Take 0.5 mg by mouth daily., Disp: , Rfl:  .  aspirin EC 81 MG tablet, Take 81 mg by mouth daily., Disp: , Rfl:  .  calcium carbonate (OS-CAL - DOSED IN MG OF ELEMENTAL CALCIUM) 1250 (500 Ca) MG tablet, Take 1 tablet (500 mg of elemental calcium total) by mouth daily with breakfast., Disp: 30 tablet, Rfl: 2 .  Cholecalciferol (VITAMIN D) 2000 UNITS CAPS, Take 1 capsule by mouth daily. , Disp: , Rfl:  .  Cyanocobalamin (VITAMIN B-12 IJ), Inject 1 Dose as directed every 30 (thirty) days., Disp: , Rfl:  .  Durvalumab (IMFINZI IV), Inject into the vein., Disp: , Rfl:  .  glipiZIDE (GLUCOTROL) 5 MG tablet, Take 1 tablet (5 mg total) by mouth 2  (two) times daily before a meal. (Patient taking differently: Take 2.5 mg by mouth 2 (two) times daily before a meal. ), Disp: 60 tablet, Rfl: 1 .  lidocaine-prilocaine (EMLA) cream, Apply a quarter size amount to port site 1 hour prior to chemo. Do not rub in. Cover with plastic wrap., Disp: 30 g, Rfl: 3 .  lisinopril (PRINIVIL,ZESTRIL) 40 MG tablet, Take 1 tablet (40 mg total) by mouth daily., Disp: 30 tablet, Rfl: 1 .  loratadine (CLARITIN) 10 MG tablet, Take 10 mg by mouth daily., Disp: , Rfl:  .  meclizine (ANTIVERT) 25 MG tablet, Take 25 mg by mouth 2 (two) times daily. , Disp: , Rfl:  .  ondansetron (ZOFRAN) 8 MG tablet, Take 1 tablet (8 mg total) by mouth every 8 (eight) hours as needed for nausea or vomiting., Disp: 30 tablet, Rfl: 2 .  pantoprazole (PROTONIX) 40 MG tablet, Take 40 mg by mouth 2 (two) times daily. , Disp: , Rfl:  .  prochlorperazine (COMPAZINE) 10 MG tablet, TAKE ONE TABLET BY MOUTH EVERY 6 HOURS AS NEEDED FOR NAUSEA AND VOMITING, Disp: 30 tablet, Rfl: 2 .  simvastatin (ZOCOR) 40 MG tablet, Take 40 mg by mouth at bedtime. , Disp: , Rfl:  .  terazosin (HYTRIN) 5 MG capsule, Take 5 mg by mouth at bedtime., Disp: , Rfl:  .  tiZANidine (ZANAFLEX) 4 MG tablet, Take 4 mg by mouth at bedtime., Disp: , Rfl:   Allergies Morphine and related  Review of Systems Review of Systems - Oncology ROS as per HPI otherwise 12 point ROS is negative.   Physical Exam  Vitals Wt Readings from Last 3 Encounters:  09/02/17 212 lb (96.2 kg)  08/28/17 211 lb 4.8 oz (95.8 kg)  08/14/17 213 lb 6.4 oz (96.8 kg)   Temp Readings from Last 3 Encounters:  09/02/17 98.4 F (36.9 C) (Oral)  08/28/17 (!) 97.5 F (36.4 C) (Oral)  08/14/17 (!) 97.4 F (36.3 C) (Oral)   BP Readings from Last 3 Encounters:  09/02/17 (!) 148/74  08/28/17 (!) 111/55  08/14/17 (!) 148/82   Pulse Readings from Last 3 Encounters:  09/02/17 89  08/28/17 70  08/14/17 70   Constitutional: Well-developed,  well-nourished, and in no distress.   HENT: Head: Normocephalic and atraumatic.  Mouth/Throat: No oropharyngeal exudate. Mucosa moist. Eyes: Pupils are  equal, round, and reactive to light. Conjunctivae are normal. No scleral icterus.  Neck: Normal range of motion. Neck supple. No JVD present.  Cardiovascular: Normal rate, regular rhythm and normal heart sounds.  Exam reveals no gallop and no friction rub.   No murmur heard. Pulmonary/Chest: Effort normal and breath sounds normal. No respiratory distress. No wheezes.No rales.  Abdominal: Soft. Bowel sounds are normal. No distension. There is no tenderness. There is no guarding.  Musculoskeletal: No edema or tenderness.  Lymphadenopathy: No cervical, axillary or supraclavicular adenopathy.  Neurological: Alert and oriented to person, place, and time. No cranial nerve deficit.  Skin: Skin is warm and dry. No rash noted. No erythema. No pallor.  Psychiatric: Affect and judgment normal.   Labs Lab on 08/14/2017  Component Date Value Ref Range Status  . WBC 08/14/2017 6.8  4.0 - 10.5 K/uL Final  . RBC 08/14/2017 4.03* 4.22 - 5.81 MIL/uL Final  . Hemoglobin 08/14/2017 11.9* 13.0 - 17.0 g/dL Final  . HCT 08/14/2017 36.2* 39.0 - 52.0 % Final  . MCV 08/14/2017 89.8  78.0 - 100.0 fL Final  . MCH 08/14/2017 29.5  26.0 - 34.0 pg Final  . MCHC 08/14/2017 32.9  30.0 - 36.0 g/dL Final  . RDW 08/14/2017 12.9  11.5 - 15.5 % Final  . Platelets 08/14/2017 219  150 - 400 K/uL Final  . Neutrophils Relative % 08/14/2017 57  % Final  . Neutro Abs 08/14/2017 3.9  1.7 - 7.7 K/uL Final  . Lymphocytes Relative 08/14/2017 25  % Final  . Lymphs Abs 08/14/2017 1.7  0.7 - 4.0 K/uL Final  . Monocytes Relative 08/14/2017 9  % Final  . Monocytes Absolute 08/14/2017 0.6  0.1 - 1.0 K/uL Final  . Eosinophils Relative 08/14/2017 9  % Final  . Eosinophils Absolute 08/14/2017 0.6  0.0 - 0.7 K/uL Final  . Basophils Relative 08/14/2017 0  % Final  . Basophils Absolute  08/14/2017 0.0  0.0 - 0.1 K/uL Final   Performed at [facility], [address], [place], [place] [address]  . Sodium 08/14/2017 134* 135 - 145 mmol/L Final  . Potassium 08/14/2017 3.6  3.5 - 5.1 mmol/L Final  . Chloride 08/14/2017 101  101 - 111 mmol/L Final  . CO2 08/14/2017 24  22 - 32 mmol/L Final  . Glucose, Bld 08/14/2017 238* 65 - 99 mg/dL Final  . BUN 08/14/2017 12  6 - 20 mg/dL Final  . Creatinine, Ser 08/14/2017 1.09  0.61 - 1.24 mg/dL Final  . Calcium 08/14/2017 8.9  8.9 - 10.3 mg/dL Final  . Total Protein 08/14/2017 6.2* 6.5 - 8.1 g/dL Final  . Albumin 08/14/2017 3.2* 3.5 - 5.0 g/dL Final  . AST 08/14/2017 21  15 - 41 U/L Final  . ALT 08/14/2017 18  17 - 63 U/L Final  . Alkaline Phosphatase 08/14/2017 65  38 - 126 U/L Final  . Total Bilirubin 08/14/2017 0.5  0.3 - 1.2 mg/dL Final  . GFR calc non Af Amer 08/14/2017 60* >60 mL/min Final  . GFR calc Af Amer 08/14/2017 >60  >60 mL/min Final   Comment: (NOTE) The eGFR has been calculated using the CKD EPI equation. This calculation has not been validated in all clinical situations. eGFR's persistently <60 mL/min signify possible Chronic Kidney Disease.   Georgiann Hahn gap 08/14/2017 9  5 - 15 Final   Performed at [facility], [address] [place], [place] [address]  . LDH 08/14/2017 132  98 - 192  U/L Final   Performed at [facility], [address], [place], [place] [address]     Pathology Orders Placed This Encounter  Procedures  . CBC with Differential/Platelet    Standing Status:   Future    Standing Expiration Date:   08/15/2018  . Comprehensive metabolic panel    Standing Status:   Future    Standing Expiration Date:   08/15/2018  . Lactate dehydrogenase    Standing Status:   Future    Standing Expiration Date:   08/15/2018       Zoila Shutter MD

## 2017-09-11 ENCOUNTER — Encounter (HOSPITAL_COMMUNITY): Payer: Self-pay

## 2017-09-11 ENCOUNTER — Other Ambulatory Visit (HOSPITAL_COMMUNITY): Payer: Medicare Other

## 2017-09-11 ENCOUNTER — Inpatient Hospital Stay (HOSPITAL_COMMUNITY): Payer: Medicare Other | Attending: Hematology and Oncology

## 2017-09-11 DIAGNOSIS — C342 Malignant neoplasm of middle lobe, bronchus or lung: Secondary | ICD-10-CM | POA: Diagnosis present

## 2017-09-11 DIAGNOSIS — C3491 Malignant neoplasm of unspecified part of right bronchus or lung: Secondary | ICD-10-CM

## 2017-09-11 LAB — CBC WITH DIFFERENTIAL/PLATELET
Basophils Absolute: 0 10*3/uL (ref 0.0–0.1)
Basophils Relative: 0 %
Eosinophils Absolute: 0.4 10*3/uL (ref 0.0–0.7)
Eosinophils Relative: 4 %
HCT: 32.4 % — ABNORMAL LOW (ref 39.0–52.0)
Hemoglobin: 10.8 g/dL — ABNORMAL LOW (ref 13.0–17.0)
Lymphocytes Relative: 18 %
Lymphs Abs: 1.6 10*3/uL (ref 0.7–4.0)
MCH: 29.8 pg (ref 26.0–34.0)
MCHC: 33.3 g/dL (ref 30.0–36.0)
MCV: 89.3 fL (ref 78.0–100.0)
Monocytes Absolute: 0.8 10*3/uL (ref 0.1–1.0)
Monocytes Relative: 10 %
Neutro Abs: 5.8 10*3/uL (ref 1.7–7.7)
Neutrophils Relative %: 68 %
Platelets: 238 10*3/uL (ref 150–400)
RBC: 3.63 MIL/uL — ABNORMAL LOW (ref 4.22–5.81)
RDW: 12.7 % (ref 11.5–15.5)
WBC: 8.6 10*3/uL (ref 4.0–10.5)

## 2017-09-11 LAB — COMPREHENSIVE METABOLIC PANEL
ALT: 16 U/L — ABNORMAL LOW (ref 17–63)
AST: 25 U/L (ref 15–41)
Albumin: 3 g/dL — ABNORMAL LOW (ref 3.5–5.0)
Alkaline Phosphatase: 71 U/L (ref 38–126)
Anion gap: 11 (ref 5–15)
BUN: 13 mg/dL (ref 6–20)
CO2: 24 mmol/L (ref 22–32)
Calcium: 8.5 mg/dL — ABNORMAL LOW (ref 8.9–10.3)
Chloride: 100 mmol/L — ABNORMAL LOW (ref 101–111)
Creatinine, Ser: 1.27 mg/dL — ABNORMAL HIGH (ref 0.61–1.24)
GFR calc Af Amer: 58 mL/min — ABNORMAL LOW (ref >60)
GFR calc non Af Amer: 50 mL/min — ABNORMAL LOW (ref >60)
Glucose, Bld: 222 mg/dL — ABNORMAL HIGH (ref 65–99)
Potassium: 3.6 mmol/L (ref 3.5–5.1)
Sodium: 135 mmol/L (ref 135–145)
Total Bilirubin: 0.6 mg/dL (ref 0.3–1.2)
Total Protein: 5.8 g/dL — ABNORMAL LOW (ref 6.5–8.1)

## 2017-09-11 LAB — LACTATE DEHYDROGENASE: LDH: 120 U/L (ref 98–192)

## 2017-09-11 MED ORDER — HEPARIN SOD (PORK) LOCK FLUSH 100 UNIT/ML IV SOLN
500.0000 [IU] | Freq: Once | INTRAVENOUS | Status: AC
  Administered 2017-09-11: 500 [IU] via INTRAVENOUS

## 2017-09-11 MED ORDER — SODIUM CHLORIDE 0.9% FLUSH
10.0000 mL | Freq: Once | INTRAVENOUS | Status: AC
  Administered 2017-09-11: 10 mL via INTRAVENOUS

## 2017-09-11 NOTE — Patient Instructions (Signed)
[place] at [facility]  Discharge Instructions:  You did not receive treatment today.  You have a scheduled CT scan with an oncology follow up visit.  Keep all appointments and call for any questions or concerns.  _______________________________________________________________  Thank you for choosing [place] at [facility] to provide your oncology and hematology care.  To afford each patient quality time with our providers, please arrive at least 15 minutes before your scheduled appointment.  You need to re-schedule your appointment if you arrive 10 or more minutes late.  We strive to give you quality time with our providers, and arriving late affects you and other patients whose appointments are after yours.  Also, if you no show three or more times for appointments you may be dismissed from the clinic.  Again, thank you for choosing [place] at [place] hope is that these requests will allow you access to exceptional care and in a timely manner. _______________________________________________________________  If you have questions after your visit, please contact our office at [phone] between the hours of 8:30 a.m. and 5:00 p.m. Voicemails left after 4:30 p.m. will not be returned until the following business day. _______________________________________________________________  For prescription refill requests, have your pharmacy contact our office. _______________________________________________________________  Recommendations made by the consultant and any test results will be sent to your referring physician. _______________________________________________________________

## 2017-09-11 NOTE — Progress Notes (Signed)
To treatment area for labs and therapy.  Recent fall last week onto right side.  Patient went to the emergency room.  No bruising noted on right chest.  Port flushed easily with good blood return.  Port site clean and dry with no bruising or swelling noted at site.  Patient denied SOB, sharp pains, or coughing up blood.  Only complains of tenderness and soreness with unable to rest on right side.    Reviewed labs, last oncology note, and patient's fall with Dr Walden Field.  Patient to be held today due to scheduled CT scan and follow up with Dr. Delton Coombes the following week.  Patient and family verbalized understanding.  Patient discharge with VSS and no s/s of distress noted.

## 2017-09-23 ENCOUNTER — Ambulatory Visit (HOSPITAL_COMMUNITY)
Admission: RE | Admit: 2017-09-23 | Discharge: 2017-09-23 | Disposition: A | Discharge status: Home / Self Care | Payer: Medicare Other | Source: Ambulatory Visit | Attending: Internal Medicine | Admitting: Internal Medicine

## 2017-09-23 DIAGNOSIS — J90 Pleural effusion, not elsewhere classified: Secondary | ICD-10-CM | POA: Insufficient documentation

## 2017-09-23 DIAGNOSIS — C3491 Malignant neoplasm of unspecified part of right bronchus or lung: Secondary | ICD-10-CM | POA: Insufficient documentation

## 2017-09-23 DIAGNOSIS — I700 Atherosclerosis of aorta: Secondary | ICD-10-CM | POA: Insufficient documentation

## 2017-09-23 DIAGNOSIS — I2510 Atherosclerotic heart disease of native coronary artery without angina pectoris: Secondary | ICD-10-CM | POA: Insufficient documentation

## 2017-09-23 DIAGNOSIS — I2584 Coronary atherosclerosis due to calcified coronary lesion: Secondary | ICD-10-CM | POA: Insufficient documentation

## 2017-09-23 DIAGNOSIS — Z902 Acquired absence of lung [part of]: Secondary | ICD-10-CM | POA: Insufficient documentation

## 2017-09-23 DIAGNOSIS — J439 Emphysema, unspecified: Secondary | ICD-10-CM | POA: Diagnosis not present

## 2017-09-23 DIAGNOSIS — D3501 Benign neoplasm of right adrenal gland: Secondary | ICD-10-CM | POA: Insufficient documentation

## 2017-09-23 DIAGNOSIS — M954 Acquired deformity of chest and rib: Secondary | ICD-10-CM | POA: Insufficient documentation

## 2017-09-23 MED ORDER — IOPAMIDOL (ISOVUE-300) INJECTION 61%
100.0000 mL | Freq: Once | INTRAVENOUS | Status: AC | PRN
  Administered 2017-09-23: 100 mL via INTRAVENOUS

## 2017-09-24 ENCOUNTER — Other Ambulatory Visit (HOSPITAL_COMMUNITY): Payer: Self-pay | Admitting: *Deleted

## 2017-09-24 DIAGNOSIS — C3491 Malignant neoplasm of unspecified part of right bronchus or lung: Secondary | ICD-10-CM

## 2017-09-25 ENCOUNTER — Ambulatory Visit (HOSPITAL_COMMUNITY): Payer: Medicare Other | Admitting: Internal Medicine

## 2017-09-25 ENCOUNTER — Inpatient Hospital Stay (HOSPITAL_COMMUNITY): Payer: Medicare Other

## 2017-09-25 ENCOUNTER — Encounter (HOSPITAL_COMMUNITY): Payer: Self-pay | Admitting: Hematology

## 2017-09-25 ENCOUNTER — Inpatient Hospital Stay (HOSPITAL_BASED_OUTPATIENT_CLINIC_OR_DEPARTMENT_OTHER): Payer: Medicare Other | Admitting: Hematology

## 2017-09-25 VITALS — BP 122/50 | HR 106 | Temp 97.7°F | Resp 18 | Wt 210.6 lb

## 2017-09-25 DIAGNOSIS — Z923 Personal history of irradiation: Secondary | ICD-10-CM

## 2017-09-25 DIAGNOSIS — R0789 Other chest pain: Secondary | ICD-10-CM | POA: Diagnosis not present

## 2017-09-25 DIAGNOSIS — C3491 Malignant neoplasm of unspecified part of right bronchus or lung: Secondary | ICD-10-CM

## 2017-09-25 DIAGNOSIS — C342 Malignant neoplasm of middle lobe, bronchus or lung: Secondary | ICD-10-CM

## 2017-09-25 DIAGNOSIS — J90 Pleural effusion, not elsewhere classified: Secondary | ICD-10-CM

## 2017-09-25 DIAGNOSIS — R42 Dizziness and giddiness: Secondary | ICD-10-CM

## 2017-09-25 DIAGNOSIS — Z9221 Personal history of antineoplastic chemotherapy: Secondary | ICD-10-CM

## 2017-09-25 DIAGNOSIS — E538 Deficiency of other specified B group vitamins: Secondary | ICD-10-CM

## 2017-09-25 LAB — CBC WITH DIFFERENTIAL/PLATELET
Basophils Absolute: 0 10*3/uL (ref 0.0–0.1)
Basophils Relative: 0 %
Eosinophils Absolute: 0.6 10*3/uL (ref 0.0–0.7)
Eosinophils Relative: 7 %
HCT: 36.7 % — ABNORMAL LOW (ref 39.0–52.0)
Hemoglobin: 12.1 g/dL — ABNORMAL LOW (ref 13.0–17.0)
Lymphocytes Relative: 20 %
Lymphs Abs: 1.7 10*3/uL (ref 0.7–4.0)
MCH: 29.4 pg (ref 26.0–34.0)
MCHC: 33 g/dL (ref 30.0–36.0)
MCV: 89.1 fL (ref 78.0–100.0)
Monocytes Absolute: 0.8 10*3/uL (ref 0.1–1.0)
Monocytes Relative: 9 %
Neutro Abs: 5.3 10*3/uL (ref 1.7–7.7)
Neutrophils Relative %: 64 %
Platelets: 245 10*3/uL (ref 150–400)
RBC: 4.12 MIL/uL — ABNORMAL LOW (ref 4.22–5.81)
RDW: 12.7 % (ref 11.5–15.5)
WBC: 8.4 10*3/uL (ref 4.0–10.5)

## 2017-09-25 LAB — COMPREHENSIVE METABOLIC PANEL
ALT: 20 U/L (ref 17–63)
AST: 31 U/L (ref 15–41)
Albumin: 3.5 g/dL (ref 3.5–5.0)
Alkaline Phosphatase: 97 U/L (ref 38–126)
Anion gap: 11 (ref 5–15)
BUN: 12 mg/dL (ref 6–20)
CO2: 25 mmol/L (ref 22–32)
Calcium: 8.8 mg/dL — ABNORMAL LOW (ref 8.9–10.3)
Chloride: 95 mmol/L — ABNORMAL LOW (ref 101–111)
Creatinine, Ser: 1.4 mg/dL — ABNORMAL HIGH (ref 0.61–1.24)
GFR calc Af Amer: 51 mL/min — ABNORMAL LOW (ref >60)
GFR calc non Af Amer: 44 mL/min — ABNORMAL LOW (ref >60)
Glucose, Bld: 295 mg/dL — ABNORMAL HIGH (ref 65–99)
Potassium: 4.6 mmol/L (ref 3.5–5.1)
Sodium: 131 mmol/L — ABNORMAL LOW (ref 135–145)
Total Bilirubin: 0.7 mg/dL (ref 0.3–1.2)
Total Protein: 6.8 g/dL (ref 6.5–8.1)

## 2017-09-25 LAB — LACTATE DEHYDROGENASE: LDH: 136 U/L (ref 98–192)

## 2017-09-25 MED ORDER — CYANOCOBALAMIN 1000 MCG/ML IJ SOLN
1000.0000 ug | Freq: Once | INTRAMUSCULAR | Status: AC
  Administered 2017-09-25: 1000 ug via INTRAMUSCULAR

## 2017-09-25 MED ORDER — CYANOCOBALAMIN 1000 MCG/ML IJ SOLN
INTRAMUSCULAR | Status: AC
  Filled 2017-09-25: qty 1

## 2017-09-25 NOTE — Progress Notes (Signed)
[place], [place] [address]   CLINIC:  Medical Oncology/Hematology  PCP:  Monico Blitz, [place] [address] [phone]   REASON FOR VISIT:  Follow-up for right lung cancer.  CURRENT THERAPY: Durvalumab every 2 weeks consolidation.  BRIEF ONCOLOGIC HISTORY:    Adenocarcinoma of right lung, stage 3 (HCC)   11/10/2015 Imaging    13 mm spiculated lesion seen in right middle lobe.      11/30/2015 PET scan    Spiculated right middle lobe nodule is mildly hypermetabolic and most consistent with a stage IA adenocarcinoma.      12/30/2015 Surgery    Right middle lobectomy and node dissection      12/30/2015 Procedure    Right video-assisted thoracoscopy, Thoracoscopic right middle lobectomy, Mediastinal lymph node dissection, and On-Q local anesthetic catheter placement.      01/02/2016 Pathology Results    1. Lung, resection (segmental or lobe), Right Middle Lobe - INVASIVE ADENOCARCINOMA, WELL DIFFERENTIATED, SPANNING 1.2 CM. - THE SURGICAL RESECTION MARGINS ARE NEGATIVE FOR CARCINOMA. 2. Lymph node, biopsy, Level 12 - METASTATIC CARCINOMA IN 1 OF 1 LYMPH NODE (1/1). 3. Lymph node, biopsy, Level 12 #2 - METASTATIC CARCINOMA IN 1 OF 1 LYMPH NODE (1/1). 4. Lymph node, biopsy, Level 7 - METASTATIC CARCINOMA IN 1 OF 1 LYMPH NODE (1/1/). 5. Lymph node, biopsy, Level 7 #2 - METASTATIC CARCINOMA IN 1 OF 1 LYMPH NODE (1/1). 6. Lymph node, biopsy, Level 7 #3 - METASTATIC CARCINOMA IN 1 OF 1 LYMPH NODE (1/1). 7. Lymph node, biopsy, Level 7 #4 - METASTATIC CARCINOMA IN 1 OF 1 LYMPH NODE (1/1). 8. Lymph node, biopsy, 4R - THERE IS NO EVIDENCE OF CARCINOMA IN 1 OF 1 LYMPH NODE (0/1). 9. Lymph node, biopsy, 4R #2 - THERE IS NO EVIDENCE OF CARCINOMA IN 1 OF 1 LYMPH NODE (0/1).      01/05/2016 Pathology Results    PDL1 NEGATIVE- tumor proportion score of 0%.       01/05/2016 Pathology Results    Genomic alterations identified:  BRAF V600E, KIT amplification, PDGFRA amplification, CDKN2A/B loss, TP53 S22f*33.  Additional findings: MSI-STABLE.  No reportable alterations identified: EGFR, KRAS, ALK, MET, RET, ERBB2, ROS1      02/09/2016 Procedure    Port placed by IR.      02/16/2016 - 05/31/2016 Chemotherapy    Carboplatin/Pemetrexed x 6 cycles        - 08/01/2016 Radiation Therapy    [place], [place]      08/28/2016 Imaging    CT CAP- No evidence of recurrent or metastatic carcinoma within the chest, abdomen, or pelvis.  Stable incidental findings include a absent, small benign right adrenal adenoma, sigmoid diverticulosis, and aortic atherosclerosis.      09/04/2016 -  Chemotherapy    Imfinzi immunotherapy up to 52 weeks.       12/07/2016 Imaging    CT C/A/P: New focal streaky opacities within the peripheral posterior right lower lobe and anteromedial left upper lobe- likely atelectasis with infection or other process is less likely. Recommend attention to these areas on future scans.  Small right pleural effusion, slightly increased from 08/28/2016. No evidence of pleural mass or enhancement.  No evidence of malignancy/ metastatic disease within the abdomen or pelvis.       02/12/2017 Imaging    CT chest: IMPRESSION: 1. Stable CT of the chest. No specific findings identified to suggest residual or recurrence of tumor. 2. Persistent  right pleural effusion. 3. Paramediastinal radiation change predominantly involving the right lung is similar to previous study. 4. Stable right adrenal gland nodule and low-density foci within the liver. 5. Aortic Atherosclerosis (ICD10-I70.0) and Emphysema (ICD10-J43.9). Multi vessel coronary artery calcifications noted.        CANCER STAGING: Cancer Staging Adenocarcinoma of right lung, stage 3 (HCC) Staging form: Lung, AJCC 7th Edition - Clinical stage from 12/09/2015: Stage IA (T1a, N0, M0) - Signed by Baird Cancer, PA-C on 12/09/2015 - Pathologic  stage from 01/20/2016: Stage IIIA (T1a, N2, cM0) - Signed by Baird Cancer, PA-C on 02/01/2016    INTERVAL HISTORY:  [name] 84 y.o. male returns for routine follow-up of his lung cancer.  He has received 1 year of Durvalumab.  He did not develop any immunotherapy related side effects.  However he has some dizziness which is long-standing, for which he takes meclizine 3 times a day.  He reportedly fell about 3 weeks ago.  Still has some soreness on the right lateral chest wall.  He went to the ER and x-rays were taken.  He also has mild breathing problems from sinus troubles.  He has some fatigue which is improving.  He has occasional constipation.  Denies any diarrhea.   REVIEW OF SYSTEMS:  Review of Systems  Constitutional: Positive for fatigue.  HENT:  Negative.   Respiratory: Negative.   Cardiovascular: Negative.   Gastrointestinal: Positive for constipation.  Genitourinary: Negative.    Musculoskeletal: Negative.   Skin: Negative.   Neurological: Positive for dizziness.  Hematological: Negative.   Psychiatric/Behavioral: Negative.      PAST MEDICAL/SURGICAL HISTORY:  Past Medical History:  Diagnosis Date  . Adenocarcinoma of lung, stage 3 (HCC)    Stage IIIA  . Adenocarcinoma of right lung, stage 3 ([facility]) 12/02/2015  . Arthritis   . Atrial fibrillation, transient ([facility])    transient postop, < 24 hours  . B12 deficiency   . Cyst of scrotum   . Diverticulitis   . GERD (gastroesophageal reflux disease)   . Hernia   . History of pneumonia   . Hyperlipidemia   . Lung nodule    Spiculated right middle lobe nodule, hypermetabolic  . Type 2 diabetes mellitus ([facility])    Past Surgical History:  Procedure Laterality Date  . BACK SURGERY    . CHOLECYSTECTOMY    . COLONOSCOPY N/A 11/24/2015   Procedure: COLONOSCOPY;  Surgeon: Rogene Houston, MD;  Location[facility];  Service: Endoscopy;  Laterality: N/A;  730  . COLONOSCOPY W/ POLYPECTOMY     x 5  . CYST EXCISION      Scrotum  . EYE SURGERY Bilateral    Cataract with Lens  . HERNIA REPAIR Right    Inguinal  . IR GENERIC HISTORICAL  02/09/2016   IR US GUIDE VASC ACCESS RIGHT 02/09/2016 Arne Cleveland, MD [facility]  . IR GENERIC HISTORICAL  02/09/2016   IR FLUORO GUIDE CV LINE RIGHT 02/09/2016 Arne Cleveland, MD [facility]  . LUMBAR DISC SURGERY     per patient report  . PORTACATH PLACEMENT    . VIDEO ASSISTED THORACOSCOPY (VATS)/ LOBECTOMY Right 12/30/2015   Procedure: VIDEO ASSISTED THORACOSCOPY (VATS)/RIGHT MIDDLE LOBECTOMY;  Surgeon: Melrose Nakayama, MD;  Location: [facility];  Service: Thoracic;  Laterality: Right;     SOCIAL HISTORY:  Social History   Socioeconomic History  . Marital status: Married    Spouse name: Not on file  . Number of children:  Not on file  . Years of education: Not on file  . Highest education level: Not on file  Occupational History  . Not on file  Social Needs  . Financial resource strain: Not on file  . Food insecurity:    Worry: Not on file    Inability: Not on file  . Transportation needs:    Medical: Not on file    Non-medical: Not on file  Tobacco Use  . Smoking status: Former Smoker    Types: Cigarettes    Last attempt to quit: 05/27/2004    Years since quitting: 13.3  . Smokeless tobacco: Never Used  Substance and Sexual Activity  . Alcohol use: No    Alcohol/week: 0.0 oz  . Drug use: No  . Sexual activity: Not on file    Comment: married  Lifestyle  . Physical activity:    Days per week: Not on file    Minutes per session: Not on file  . Stress: Not on file  Relationships  . Social connections:    Talks on phone: Not on file    Gets together: Not on file    Attends religious service: Not on file    Active member of club or organization: Not on file    Attends meetings of clubs or organizations: Not on file    Relationship status: Not on file  . Intimate partner violence:    Fear of current or ex partner: Not on file     Emotionally abused: Not on file    Physically abused: Not on file    Forced sexual activity: Not on file  Other Topics Concern  . Not on file  Social History Narrative  . Not on file    FAMILY HISTORY:  Family History  Problem Relation Age of Onset  . Colon cancer Brother     CURRENT MEDICATIONS:  Outpatient Encounter Medications as of 09/25/2017  Medication Sig  . ALPRAZolam (XANAX) 0.5 MG tablet Take 0.5 mg by mouth daily.  Marland Kitchen aspirin EC 81 MG tablet Take 81 mg by mouth daily.  . calcium carbonate (OS-CAL - DOSED IN MG OF ELEMENTAL CALCIUM) 1250 (500 Ca) MG tablet Take 1 tablet (500 mg of elemental calcium total) by mouth daily with breakfast.  . Cholecalciferol (VITAMIN D) 2000 UNITS CAPS Take 1 capsule by mouth daily.   . Cyanocobalamin (VITAMIN B-12 IJ) Inject 1 Dose as directed every 30 (thirty) days.  Hunt Oris (IMFINZI IV) Inject into the vein.  Marland Kitchen glipiZIDE (GLUCOTROL) 5 MG tablet Take 1 tablet (5 mg total) by mouth 2 (two) times daily before a meal. (Patient taking differently: Take 2.5 mg by mouth 2 (two) times daily before a meal. )  . lidocaine-prilocaine (EMLA) cream Apply a quarter size amount to port site 1 hour prior to chemo. Do not rub in. Cover with plastic wrap.  . lisinopril (PRINIVIL,ZESTRIL) 40 MG tablet Take 1 tablet (40 mg total) by mouth daily.  Marland Kitchen loratadine (CLARITIN) 10 MG tablet Take 10 mg by mouth daily.  . meclizine (ANTIVERT) 25 MG tablet Take 25 mg by mouth 2 (two) times daily.   . ondansetron (ZOFRAN) 8 MG tablet Take 1 tablet (8 mg total) by mouth every 8 (eight) hours as needed for nausea or vomiting.  . pantoprazole (PROTONIX) 40 MG tablet Take 40 mg by mouth 2 (two) times daily.   . prochlorperazine (COMPAZINE) 10 MG tablet TAKE ONE TABLET BY MOUTH EVERY 6 HOURS AS NEEDED FOR NAUSEA AND  VOMITING  . simvastatin (ZOCOR) 40 MG tablet Take 40 mg by mouth at bedtime.   Marland Kitchen terazosin (HYTRIN) 5 MG capsule Take 5 mg by mouth at bedtime.  Marland Kitchen tiZANidine  (ZANAFLEX) 4 MG tablet Take 4 mg by mouth at bedtime.   No facility-administered encounter medications on file as of 09/25/2017.     ALLERGIES:  Allergies  Allergen Reactions  . Morphine And Related Other (See Comments)    confusion     PHYSICAL EXAM:  ECOG Performance status: 2  Vitals:   09/25/17 1022  BP: (!) 122/50  Pulse: (!) 106  Resp: 18  Temp: 97.7 F (36.5 C)  SpO2: 90%   Filed Weights   09/25/17 1022  Weight: 210 lb 9.6 oz (95.5 kg)      LABORATORY DATA:  I have reviewed the labs as listed.  CBC    Component Value Date/Time   WBC 8.4 09/25/2017 0924   RBC 4.12 (L) 09/25/2017 0924   HGB 12.1 (L) 09/25/2017 0924   HCT 36.7 (L) 09/25/2017 0924   PLT 245 09/25/2017 0924   MCV 89.1 09/25/2017 0924   MCH 29.4 09/25/2017 0924   MCHC 33.0 09/25/2017 0924   RDW 12.7 09/25/2017 0924   LYMPHSABS 1.7 09/25/2017 0924   MONOABS 0.8 09/25/2017 0924   EOSABS 0.6 09/25/2017 0924   BASOSABS 0.0 09/25/2017 0924   CMP Latest Ref Rng & Units 09/25/2017 09/11/2017 08/28/2017  Glucose 65 - 99 mg/dL 295(H) 222(H) 272(H)  BUN 6 - 20 mg/dL '12 13 12  ' Creatinine 0.61 - 1.24 mg/dL 1.40(H) 1.27(H) 1.15  Sodium 135 - 145 mmol/L 131(L) 135 132(L)  Potassium 3.5 - 5.1 mmol/L 4.6 3.6 3.5  Chloride 101 - 111 mmol/L 95(L) 100(L) 100(L)  CO2 22 - 32 mmol/L '25 24 22  ' Calcium 8.9 - 10.3 mg/dL 8.8(L) 8.5(L) 8.5(L)  Total Protein 6.5 - 8.1 g/dL 6.8 5.8(L) 6.1(L)  Total Bilirubin 0.3 - 1.2 mg/dL 0.7 0.6 0.7  Alkaline Phos 38 - 126 U/L 97 71 64  AST 15 - 41 U/L '31 25 31  ' ALT 17 - 63 U/L 20 16(L) 21       DIAGNOSTIC IMAGING:  I have independently reviewed his CT scan of the chest, abdomen and pelvis and agree with radiology report.  No sign of any metastatic disease.    ASSESSMENT & PLAN:   Adenocarcinoma of right lung, stage 3 (HCC) 1.  Stage IIIa right lung adenocarcinoma: - Diagnosed in June 2017, status post right middle tubectomy, VATS with mediastinal node sampling, PDL  1 negative - Status post carboplatin and pemetrexed for 6 cycles completed on 05/31/2016 -Radiation therapy completed 08/01/2016 -Consolidation Durvalumab started on 09/04/2016 -I have reviewed the results of the CT scan of the chest, abdomen and pelvis dated 09/23/2017 which did not show any evidence of recurrence or metastatic disease.  Stable right pleural effusion was present.  No rib fractures were seen.  Since he has completed 12 months of Durvalumab, I have discontinued it.  We will do active surveillance from now on.  He will come back in 3-4 months with repeat CT scan.  He felt very happy about it.  He inquired about taking shingles vaccine.  I have recommended taking Shingrix.  2.  Right chest wall soreness: He felt about 3 weeks ago.  He was seen in the ER and x-rays did not show any fractures.  His recent CT scan also did not show any abnormalities.  Most likely musculoskeletal.  This will improve with time.  3.  Dizziness: He is taking meclizine 3 times a day.  He is walking with the help of walker.  His last CT scan of brain in March did not show any brain metastasis.      Orders placed this encounter:  Orders Placed This Encounter  Procedures  . CT Abdomen Pelvis W Contrast  . CT Chest W Contrast  . CBC  . Comprehensive metabolic panel  . TSH      Derek Jack, [place] [phone]

## 2017-09-25 NOTE — Patient Instructions (Signed)
[facility] at [facility] Discharge Instructions  Received Vit B12 injection today. Follow-up as scheduled. Call clinic for any questions or concerns   Thank you for choosing [facility] at [facility] to provide your oncology and hematology care.  To afford each patient quality time with our provider, please arrive at least 15 minutes before your scheduled appointment time.   If you have a lab appointment with the Cancer Center please come in thru the  Main Entrance and check in at the main information desk  You need to re-schedule your appointment should you arrive 10 or more minutes late.  We strive to give you quality time with our providers, and arriving late affects you and other patients whose appointments are after yours.  Also, if you no show three or more times for appointments you may be dismissed from the clinic at the providers discretion.     Again, thank you for choosing [facility].  Our hope is that these requests will decrease the amount of time that you wait before being seen by our physicians.       _____________________________________________________________  Should you have questions after your visit to [facility], please contact our office at [phone] between the hours of 8:30 a.m. and 4:30 p.m.  Voicemails left after 4:30 p.m. will not be returned until the following business day.  For prescription refill requests, have your pharmacy contact our office.       Resources For Cancer Patients and their Caregivers ? American Cancer Society: Can assist with transportation, wigs, general needs, runs Look Good Feel Better.        1-888-227-6333 ? Cancer Care: Provides financial assistance, online support groups, medication/co-pay assistance.  1-800-813-HOPE (4673) ? [facility] Assists [place] cancer patients and their families through emotional , educational and  financial support.  336-427-4357 ? [place] DSS Where to apply for food stamps, Medicaid and utility assistance. 336-342-1394 ? [facility]: Transportation to medical appointments. 336-347-2287 ? Social Security Administration: May apply for disability if have a Stage IV cancer. 336-342-7796 1-800-772-1213 ? [place] Aging, Disability and Transit Services: Assists with nutrition, care and transit needs. 336-349-2343  Cancer Center Support Programs:   > Cancer Support Group  2nd Tuesday of the month 1pm-2pm, Journey Room   > Creative Journey  3rd Tuesday of the month 1130am-1pm, Journey Room    

## 2017-09-25 NOTE — Assessment & Plan Note (Signed)
1.  Stage IIIa right lung adenocarcinoma: - Diagnosed in June 2017, status post right middle tubectomy, VATS with mediastinal node sampling, PDL 1 negative - Status post carboplatin and pemetrexed for 6 cycles completed on 05/31/2016 -Radiation therapy completed 08/01/2016 -Consolidation Durvalumab started on 09/04/2016 -I have reviewed the results of the CT scan of the chest, abdomen and pelvis dated 09/23/2017 which did not show any evidence of recurrence or metastatic disease.  Stable right pleural effusion was present.  No rib fractures were seen.  Since he has completed 12 months of Durvalumab, I have discontinued it.  We will do active surveillance from now on.  He will come back in 3-4 months with repeat CT scan.  He felt very happy about it.  He inquired about taking shingles vaccine.  I have recommended taking Shingrix.  2.  Right chest wall soreness: He felt about 3 weeks ago.  He was seen in the ER and x-rays did not show any fractures.  His recent CT scan also did not show any abnormalities.  Most likely musculoskeletal.  This will improve with time.  3.  Dizziness: He is taking meclizine 3 times a day.  He is walking with the help of walker.  His last CT scan of brain in March did not show any brain metastasis.

## 2017-09-25 NOTE — Progress Notes (Signed)
[name] tolerated Vit B12 injection well without complaints or incident. No further Imfinzi infusions needed at this time per MD. Pt discharged self ambulatory using walker in satisfactory condition accompanied by his wife

## 2017-09-25 NOTE — Patient Instructions (Signed)
[facility] at [facility] Discharge Instructions  You saw Dr. Katragadda today.   Thank you for choosing [facility] at [facility] to provide your oncology and hematology care.  To afford each patient quality time with our provider, please arrive at least 15 minutes before your scheduled appointment time.   If you have a lab appointment with the Cancer Center please come in thru the  Main Entrance and check in at the main information desk  You need to re-schedule your appointment should you arrive 10 or more minutes late.  We strive to give you quality time with our providers, and arriving late affects you and other patients whose appointments are after yours.  Also, if you no show three or more times for appointments you may be dismissed from the clinic at the providers discretion.     Again, thank you for choosing [facility].  Our hope is that these requests will decrease the amount of time that you wait before being seen by our physicians.       _____________________________________________________________  Should you have questions after your visit to [facility], please contact our office at [phone] between the hours of 8:30 a.m. and 4:30 p.m.  Voicemails left after 4:30 p.m. will not be returned until the following business day.  For prescription refill requests, have your pharmacy contact our office.       Resources For Cancer Patients and their Caregivers ? American Cancer Society: Can assist with transportation, wigs, general needs, runs Look Good Feel Better.        1-888-227-6333 ? Cancer Care: Provides financial assistance, online support groups, medication/co-pay assistance.  1-800-813-HOPE (4673) ? [facility] Assists [place] cancer patients and their families through emotional , educational and financial support.  336-427-4357 ? [place] DSS Where to apply for  food stamps, Medicaid and utility assistance. 336-342-1394 ? RC[facility]: Transportation to medical appointments. 336-347-2287 ? Social Security Administration: May apply for disability if have a Stage IV cancer. 336-342-7796 1-800-772-1213 ? [place] Aging, Disability and Transit Services: Assists with nutrition, care and transit needs. 336-349-2343  Cancer Center Support Programs:   > Cancer Support Group  2nd Tuesday of the month 1pm-2pm, Journey Room   > Creative Journey  3rd Tuesday of the month 1130am-1pm, Journey Room     

## 2017-10-25 ENCOUNTER — Encounter (HOSPITAL_COMMUNITY): Payer: Self-pay

## 2017-10-25 ENCOUNTER — Other Ambulatory Visit: Payer: Self-pay

## 2017-10-25 ENCOUNTER — Inpatient Hospital Stay (HOSPITAL_COMMUNITY): Payer: Medicare Other | Attending: Hematology and Oncology

## 2017-10-25 VITALS — BP 137/51 | HR 90 | Temp 97.5°F

## 2017-10-25 DIAGNOSIS — C3491 Malignant neoplasm of unspecified part of right bronchus or lung: Secondary | ICD-10-CM

## 2017-10-25 DIAGNOSIS — C342 Malignant neoplasm of middle lobe, bronchus or lung: Secondary | ICD-10-CM | POA: Insufficient documentation

## 2017-10-25 DIAGNOSIS — E538 Deficiency of other specified B group vitamins: Secondary | ICD-10-CM

## 2017-10-25 MED ORDER — HEPARIN SOD (PORK) LOCK FLUSH 100 UNIT/ML IV SOLN
INTRAVENOUS | Status: AC
  Filled 2017-10-25: qty 5

## 2017-10-25 MED ORDER — HEPARIN SOD (PORK) LOCK FLUSH 100 UNIT/ML IV SOLN
500.0000 [IU] | Freq: Once | INTRAVENOUS | Status: AC
  Administered 2017-10-25: 500 [IU] via INTRAVENOUS

## 2017-10-25 MED ORDER — SODIUM CHLORIDE 0.9% FLUSH: 10.0000 mL | INTRAVENOUS | Status: DC | PRN

## 2017-10-25 MED ORDER — CYANOCOBALAMIN 1000 MCG/ML IJ SOLN
INTRAMUSCULAR | Status: AC
  Filled 2017-10-25: qty 1

## 2017-10-25 MED ORDER — CYANOCOBALAMIN 1000 MCG/ML IJ SOLN
1000.0000 ug | Freq: Once | INTRAMUSCULAR | Status: AC
  Administered 2017-10-25: 1000 ug via INTRAMUSCULAR

## 2017-10-25 NOTE — Progress Notes (Signed)
[name] presented for Portacath access and flush. PPortacath located rt chest wall accessed with  H 20 needle. Good blood return present. Portacath flushed with 60ml NS and 500U/62ml Heparin and needle removed intact. Procedure without incident. Patient tolerated procedure well.  [name] presents today for injection per MD orders. B12 1011mcg administered IM in left Upper Arm. Administration without incident. Patient tolerated well.

## 2017-11-25 ENCOUNTER — Inpatient Hospital Stay (HOSPITAL_COMMUNITY): Payer: Medicare Other | Attending: Hematology and Oncology

## 2017-11-25 VITALS — BP 121/54 | HR 87 | Resp 18

## 2017-11-25 DIAGNOSIS — C3491 Malignant neoplasm of unspecified part of right bronchus or lung: Secondary | ICD-10-CM

## 2017-11-25 DIAGNOSIS — C342 Malignant neoplasm of middle lobe, bronchus or lung: Secondary | ICD-10-CM | POA: Insufficient documentation

## 2017-11-25 DIAGNOSIS — Z79899 Other long term (current) drug therapy: Secondary | ICD-10-CM | POA: Insufficient documentation

## 2017-11-25 DIAGNOSIS — E538 Deficiency of other specified B group vitamins: Secondary | ICD-10-CM

## 2017-11-25 LAB — COMPREHENSIVE METABOLIC PANEL
ALT: 23 U/L (ref 17–63)
AST: 27 U/L (ref 15–41)
Albumin: 3.3 g/dL — ABNORMAL LOW (ref 3.5–5.0)
Alkaline Phosphatase: 68 U/L (ref 38–126)
Anion gap: 8 (ref 5–15)
BUN: 14 mg/dL (ref 6–20)
CO2: 25 mmol/L (ref 22–32)
Calcium: 8.7 mg/dL — ABNORMAL LOW (ref 8.9–10.3)
Chloride: 100 mmol/L — ABNORMAL LOW (ref 101–111)
Creatinine, Ser: 1.15 mg/dL (ref 0.61–1.24)
GFR calc Af Amer: >60 mL/min (ref >60)
GFR calc non Af Amer: 56 mL/min — ABNORMAL LOW (ref >60)
Glucose, Bld: 177 mg/dL — ABNORMAL HIGH (ref 65–99)
Potassium: 3.9 mmol/L (ref 3.5–5.1)
Sodium: 133 mmol/L — ABNORMAL LOW (ref 135–145)
Total Bilirubin: 0.6 mg/dL (ref 0.3–1.2)
Total Protein: 6.2 g/dL — ABNORMAL LOW (ref 6.5–8.1)

## 2017-11-25 LAB — CBC
HCT: 34.7 % — ABNORMAL LOW (ref 39.0–52.0)
Hemoglobin: 11.5 g/dL — ABNORMAL LOW (ref 13.0–17.0)
MCH: 29.4 pg (ref 26.0–34.0)
MCHC: 33.1 g/dL (ref 30.0–36.0)
MCV: 88.7 fL (ref 78.0–100.0)
Platelets: 234 10*3/uL (ref 150–400)
RBC: 3.91 MIL/uL — ABNORMAL LOW (ref 4.22–5.81)
RDW: 13.3 % (ref 11.5–15.5)
WBC: 8.6 10*3/uL (ref 4.0–10.5)

## 2017-11-25 LAB — TSH: TSH: 6.602 u[IU]/mL — ABNORMAL HIGH (ref 0.350–4.500)

## 2017-11-25 MED ORDER — SODIUM CHLORIDE 0.9% FLUSH
10.0000 mL | Freq: Once | INTRAVENOUS | Status: AC
  Administered 2017-11-25: 10 mL via INTRAVENOUS

## 2017-11-25 MED ORDER — HEPARIN SOD (PORK) LOCK FLUSH 100 UNIT/ML IV SOLN
INTRAVENOUS | Status: AC
  Filled 2017-11-25: qty 5

## 2017-11-25 MED ORDER — HEPARIN SOD (PORK) LOCK FLUSH 100 UNIT/ML IV SOLN
500.0000 [IU] | Freq: Once | INTRAVENOUS | Status: AC
  Administered 2017-11-25: 500 [IU] via INTRAVENOUS

## 2017-11-25 MED ORDER — CYANOCOBALAMIN 1000 MCG/ML IJ SOLN
1000.0000 ug | Freq: Once | INTRAMUSCULAR | Status: AC
  Administered 2017-11-25: 1000 ug via INTRAMUSCULAR
  Filled 2017-11-25: qty 1

## 2017-11-25 NOTE — Patient Instructions (Signed)
[place] at [facility]  Discharge Instructions:  You received a b12 shot today and port flush with labs.  _______________________________________________________________  Thank you for choosing [place] at [facility] to provide your oncology and hematology care.  To afford each patient quality time with our providers, please arrive at least 15 minutes before your scheduled appointment.  You need to re-schedule your appointment if you arrive 10 or more minutes late.  We strive to give you quality time with our providers, and arriving late affects you and other patients whose appointments are after yours.  Also, if you no show three or more times for appointments you may be dismissed from the clinic.  Again, thank you for choosing [place] at [facility] hope is that these requests will allow you access to exceptional care and in a timely manner. _______________________________________________________________  If you have questions after your visit, please contact our office at [phone] between the hours of 8:30 a.m. and 5:00 p.m. Voicemails left after 4:30 p.m. will not be returned until the following business day. _______________________________________________________________  For prescription refill requests, have your pharmacy contact our office. _______________________________________________________________  Recommendations made by the consultant and any test results will be sent to your referring physician. _______________________________________________________________

## 2017-11-25 NOTE — Progress Notes (Signed)
Port flushed with lab draw today.  Good blood return noted.  Site clean and dry with no bruising or swelling noted at site.  Band aid applied.  Vitamin b12 shot given with no complaints.  Band aid applied.  VSs with discharge and left with family with no s/s of distress noted.

## 2017-12-18 ENCOUNTER — Ambulatory Visit (HOSPITAL_COMMUNITY)
Admission: RE | Admit: 2017-12-18 | Discharge: 2017-12-18 | Disposition: A | Discharge status: Home / Self Care | Payer: Medicare Other | Source: Ambulatory Visit | Attending: Hematology | Admitting: Hematology

## 2017-12-18 ENCOUNTER — Other Ambulatory Visit (HOSPITAL_COMMUNITY): Payer: Medicare Other

## 2017-12-18 DIAGNOSIS — C3491 Malignant neoplasm of unspecified part of right bronchus or lung: Secondary | ICD-10-CM | POA: Diagnosis not present

## 2017-12-18 DIAGNOSIS — D3501 Benign neoplasm of right adrenal gland: Secondary | ICD-10-CM | POA: Insufficient documentation

## 2017-12-18 DIAGNOSIS — Z923 Personal history of irradiation: Secondary | ICD-10-CM | POA: Diagnosis not present

## 2017-12-18 DIAGNOSIS — Z902 Acquired absence of lung [part of]: Secondary | ICD-10-CM | POA: Insufficient documentation

## 2017-12-18 DIAGNOSIS — J90 Pleural effusion, not elsewhere classified: Secondary | ICD-10-CM | POA: Insufficient documentation

## 2017-12-18 MED ORDER — IOPAMIDOL (ISOVUE-300) INJECTION 61%
100.0000 mL | Freq: Once | INTRAVENOUS | Status: AC | PRN
  Administered 2017-12-18: 100 mL via INTRAVENOUS

## 2017-12-18 MED ORDER — IOPAMIDOL (ISOVUE-M 300) INJECTION 61%: 15.0000 mL | Freq: Once | INTRAMUSCULAR | Status: DC | PRN

## 2017-12-25 ENCOUNTER — Inpatient Hospital Stay (HOSPITAL_BASED_OUTPATIENT_CLINIC_OR_DEPARTMENT_OTHER): Payer: Medicare Other | Admitting: Hematology

## 2017-12-25 ENCOUNTER — Other Ambulatory Visit: Payer: Self-pay

## 2017-12-25 ENCOUNTER — Inpatient Hospital Stay (HOSPITAL_COMMUNITY): Payer: Medicare Other | Attending: Hematology and Oncology

## 2017-12-25 ENCOUNTER — Encounter (HOSPITAL_COMMUNITY): Payer: Self-pay | Admitting: Hematology

## 2017-12-25 VITALS — BP 109/50 | HR 85 | Resp 16 | Wt 212.3 lb

## 2017-12-25 DIAGNOSIS — Z87891 Personal history of nicotine dependence: Secondary | ICD-10-CM | POA: Diagnosis not present

## 2017-12-25 DIAGNOSIS — E538 Deficiency of other specified B group vitamins: Secondary | ICD-10-CM

## 2017-12-25 DIAGNOSIS — R42 Dizziness and giddiness: Secondary | ICD-10-CM | POA: Diagnosis not present

## 2017-12-25 DIAGNOSIS — E119 Type 2 diabetes mellitus without complications: Secondary | ICD-10-CM | POA: Insufficient documentation

## 2017-12-25 DIAGNOSIS — Z85118 Personal history of other malignant neoplasm of bronchus and lung: Secondary | ICD-10-CM | POA: Insufficient documentation

## 2017-12-25 DIAGNOSIS — J90 Pleural effusion, not elsewhere classified: Secondary | ICD-10-CM | POA: Insufficient documentation

## 2017-12-25 DIAGNOSIS — Z9221 Personal history of antineoplastic chemotherapy: Secondary | ICD-10-CM | POA: Insufficient documentation

## 2017-12-25 DIAGNOSIS — Z923 Personal history of irradiation: Secondary | ICD-10-CM | POA: Insufficient documentation

## 2017-12-25 DIAGNOSIS — I639 Cerebral infarction, unspecified: Secondary | ICD-10-CM | POA: Diagnosis not present

## 2017-12-25 DIAGNOSIS — Z800 Family history of malignant neoplasm of digestive organs: Secondary | ICD-10-CM | POA: Insufficient documentation

## 2017-12-25 DIAGNOSIS — C3491 Malignant neoplasm of unspecified part of right bronchus or lung: Secondary | ICD-10-CM

## 2017-12-25 MED ORDER — CYANOCOBALAMIN 1000 MCG/ML IJ SOLN
1000.0000 ug | Freq: Once | INTRAMUSCULAR | Status: AC
  Administered 2017-12-25: 1000 ug via INTRAMUSCULAR
  Filled 2017-12-25: qty 1

## 2017-12-25 NOTE — Progress Notes (Signed)
[facility] [place], [place] [address]   CLINIC:  Medical Oncology/Hematology  PCP:  Monico Blitz, MD [place] [address] [phone]   REASON FOR VISIT:  Follow-up for non small cell right lung cancer  CURRENT THERAPY: Surveillance per NCCN guidlines  BRIEF ONCOLOGIC HISTORY:    Adenocarcinoma of right lung, stage 3 ([facility])   11/10/2015 Imaging    13 mm spiculated lesion seen in right middle lobe.      11/30/2015 PET scan    Spiculated right middle lobe nodule is mildly hypermetabolic and most consistent with a stage IA adenocarcinoma.      12/30/2015 Surgery    Right middle lobectomy and node dissection      12/30/2015 Procedure    Right video-assisted thoracoscopy, Thoracoscopic right middle lobectomy, Mediastinal lymph node dissection, and On-Q local anesthetic catheter placement.      01/02/2016 Pathology Results    1. Lung, resection (segmental or lobe), Right Middle Lobe - INVASIVE ADENOCARCINOMA, WELL DIFFERENTIATED, SPANNING 1.2 CM. - THE SURGICAL RESECTION MARGINS ARE NEGATIVE FOR CARCINOMA. 2. Lymph node, biopsy, Level 12 - METASTATIC CARCINOMA IN 1 OF 1 LYMPH NODE (1/1). 3. Lymph node, biopsy, Level 12 #2 - METASTATIC CARCINOMA IN 1 OF 1 LYMPH NODE (1/1). 4. Lymph node, biopsy, Level 7 - METASTATIC CARCINOMA IN 1 OF 1 LYMPH NODE (1/1/). 5. Lymph node, biopsy, Level 7 #2 - METASTATIC CARCINOMA IN 1 OF 1 LYMPH NODE (1/1). 6. Lymph node, biopsy, Level 7 #3 - METASTATIC CARCINOMA IN 1 OF 1 LYMPH NODE (1/1). 7. Lymph node, biopsy, Level 7 #4 - METASTATIC CARCINOMA IN 1 OF 1 LYMPH NODE (1/1). 8. Lymph node, biopsy, 4R - THERE IS NO EVIDENCE OF CARCINOMA IN 1 OF 1 LYMPH NODE (0/1). 9. Lymph node, biopsy, 4R #2 - THERE IS NO EVIDENCE OF CARCINOMA IN 1 OF 1 LYMPH NODE (0/1).      01/05/2016 Pathology Results    PDL1 NEGATIVE- tumor proportion score of 0%.       01/05/2016 Pathology Results    Genomic alterations  identified: BRAF V600E, KIT amplification, PDGFRA amplification, CDKN2A/B loss, TP53 S21f*33.  Additional findings: MSI-STABLE.  No reportable alterations identified: EGFR, KRAS, ALK, MET, RET, ERBB2, ROS1      02/09/2016 Procedure    Port placed by IR.      02/16/2016 - 05/31/2016 Chemotherapy    Carboplatin/Pemetrexed x 6 cycles        - 08/01/2016 Radiation Therapy    [place], [place]      08/28/2016 Imaging    CT CAP- No evidence of recurrent or metastatic carcinoma within the chest, abdomen, or pelvis.  Stable incidental findings include a absent, small benign right adrenal adenoma, sigmoid diverticulosis, and aortic atherosclerosis.      09/04/2016 -  Chemotherapy    Imfinzi immunotherapy up to 52 weeks.       12/07/2016 Imaging    CT C/A/P: New focal streaky opacities within the peripheral posterior right lower lobe and anteromedial left upper lobe- likely atelectasis with infection or other process is less likely. Recommend attention to these areas on future scans.  Small right pleural effusion, slightly increased from 08/28/2016. No evidence of pleural mass or enhancement.  No evidence of malignancy/ metastatic disease within the abdomen or pelvis.       02/12/2017 Imaging    CT chest: IMPRESSION: 1. Stable CT of the chest. No specific findings identified to suggest residual or recurrence of tumor.  2. Persistent right pleural effusion. 3. Paramediastinal radiation change predominantly involving the right lung is similar to previous study. 4. Stable right adrenal gland nodule and low-density foci within the liver. 5. Aortic Atherosclerosis (ICD10-I70.0) and Emphysema (ICD10-J43.9). Multi vessel coronary artery calcifications noted.        CANCER STAGING: Cancer Staging Adenocarcinoma of right lung, stage 3 (HCC) Staging form: Lung, AJCC 7th Edition - Clinical stage from 12/09/2015: Stage IA (T1a, N0, M0) - Signed by Baird Cancer, PA-C on 12/09/2015 -  Pathologic stage from 01/20/2016: Stage IIIA (T1a, N2, cM0) - Signed by Baird Cancer, PA-C on 02/01/2016    INTERVAL HISTORY:  [name] 84 y.o. male returns for routine follow-up for non small cell right lung cancer. Patient is here today with his wife. Patient is still having issues with his balance. He falls multiple times a week. He feels dizzy and lightheaded when he turns his head too fast. He walks with a walker because of his instability. Wife has removed all rugs in the house. He stays in the house most of his time. Patient denies any new pains. Denies any nausea, vomiting, or diarrhea.     REVIEW OF SYSTEMS:  Review of Systems  Constitutional: Positive for fatigue.  HENT:   Positive for trouble swallowing.   Eyes: Negative.   Respiratory: Negative.   Cardiovascular: Positive for leg swelling.  Gastrointestinal: Negative.   Endocrine: Negative.   Genitourinary: Negative.    Musculoskeletal: Negative.   Skin: Negative.   Neurological: Positive for dizziness and numbness.  Hematological: Bruises/bleeds easily.  Psychiatric/Behavioral: Negative.      PAST MEDICAL/SURGICAL HISTORY:  Past Medical History:  Diagnosis Date  . Adenocarcinoma of lung, stage 3 (HCC)    Stage IIIA  . Adenocarcinoma of right lung, stage 3 ([facility]) 12/02/2015  . Arthritis   . Atrial fibrillation, transient ([facility])    transient postop, < 24 hours  . B12 deficiency   . Cyst of scrotum   . Diverticulitis   . GERD (gastroesophageal reflux disease)   . Hernia   . History of pneumonia   . Hyperlipidemia   . Lung nodule    Spiculated right middle lobe nodule, hypermetabolic  . Type 2 diabetes mellitus ([facility])    Past Surgical History:  Procedure Laterality Date  . BACK SURGERY    . CHOLECYSTECTOMY    . COLONOSCOPY N/A 11/24/2015   Procedure: COLONOSCOPY;  Surgeon: Rogene Houston, MD;  Location: [facility];  Service: Endoscopy;  Laterality: N/A;  730  . COLONOSCOPY W/ POLYPECTOMY     x 5  .  CYST EXCISION     Scrotum  . EYE SURGERY Bilateral    Cataract with Lens  . HERNIA REPAIR Right    Inguinal  . IR GENERIC HISTORICAL  02/09/2016   IR US GUIDE VASC ACCESS RIGHT 02/09/2016 Arne Cleveland, MD [facility]  . IR GENERIC HISTORICAL  02/09/2016   IR FLUORO GUIDE CV LINE RIGHT 02/09/2016 Arne Cleveland, MD [facility]  . LUMBAR DISC SURGERY     per patient report  . PORTACATH PLACEMENT    . VIDEO ASSISTED THORACOSCOPY (VATS)/ LOBECTOMY Right 12/30/2015   Procedure: VIDEO ASSISTED THORACOSCOPY (VATS)/RIGHT MIDDLE LOBECTOMY;  Surgeon: Melrose Nakayama, MD;  Location: [facility];  Service: Thoracic;  Laterality: Right;     SOCIAL HISTORY:  Social History   Socioeconomic History  . Marital status: Married    Spouse name: Not on file  . Number of children:  Not on file  . Years of education: Not on file  . Highest education level: Not on file  Occupational History  . Not on file  Social Needs  . Financial resource strain: Not on file  . Food insecurity:    Worry: Not on file    Inability: Not on file  . Transportation needs:    Medical: Not on file    Non-medical: Not on file  Tobacco Use  . Smoking status: Former Smoker    Types: Cigarettes    Last attempt to quit: 05/27/2004    Years since quitting: 13.5  . Smokeless tobacco: Never Used  Substance and Sexual Activity  . Alcohol use: No    Alcohol/week: 0.0 oz  . Drug use: No  . Sexual activity: Not on file    Comment: married  Lifestyle  . Physical activity:    Days per week: Not on file    Minutes per session: Not on file  . Stress: Not on file  Relationships  . Social connections:    Talks on phone: Not on file    Gets together: Not on file    Attends religious service: Not on file    Active member of club or organization: Not on file    Attends meetings of clubs or organizations: Not on file    Relationship status: Not on file  . Intimate partner violence:    Fear of current or ex partner: Not on  file    Emotionally abused: Not on file    Physically abused: Not on file    Forced sexual activity: Not on file  Other Topics Concern  . Not on file  Social History Narrative  . Not on file    FAMILY HISTORY:  Family History  Problem Relation Age of Onset  . Colon cancer Brother     CURRENT MEDICATIONS:  Outpatient Encounter Medications as of 12/25/2017  Medication Sig  . ACCU-CHEK AVIVA PLUS test strip USE 5 STRIPS DAILY TO MONITOR FLUCTUATING BLOOD SUGAR  . ACCU-CHEK FASTCLIX LANCETS MISC USE 6 LANCETS DAILY  . ALPRAZolam (XANAX) 0.5 MG tablet Take 0.5 mg by mouth daily.  Marland Kitchen aspirin EC 81 MG tablet Take 81 mg by mouth daily.  . calcium carbonate (OS-CAL - DOSED IN MG OF ELEMENTAL CALCIUM) 1250 (500 Ca) MG tablet Take 1 tablet (500 mg of elemental calcium total) by mouth daily with breakfast.  . Cholecalciferol (VITAMIN D) 2000 UNITS CAPS Take 1 capsule by mouth daily.   . Cyanocobalamin (VITAMIN B-12 IJ) Inject 1 Dose as directed every 30 (thirty) days.  Marland Kitchen glipiZIDE (GLUCOTROL) 5 MG tablet Take 1 tablet (5 mg total) by mouth 2 (two) times daily before a meal. (Patient taking differently: Take 2.5 mg by mouth 2 (two) times daily before a meal. )  . lidocaine-prilocaine (EMLA) cream Apply a quarter size amount to port site 1 hour prior to chemo. Do not rub in. Cover with plastic wrap.  . lisinopril (PRINIVIL,ZESTRIL) 40 MG tablet Take 1 tablet (40 mg total) by mouth daily.  Marland Kitchen loratadine (CLARITIN) 10 MG tablet Take 10 mg by mouth daily.  . meclizine (ANTIVERT) 25 MG tablet Take 25 mg by mouth 2 (two) times daily.   . ondansetron (ZOFRAN) 8 MG tablet Take 1 tablet (8 mg total) by mouth every 8 (eight) hours as needed for nausea or vomiting.  . pantoprazole (PROTONIX) 40 MG tablet Take 40 mg by mouth 2 (two) times daily.   . prochlorperazine (  COMPAZINE) 10 MG tablet TAKE ONE TABLET BY MOUTH EVERY 6 HOURS AS NEEDED FOR NAUSEA AND VOMITING  . simvastatin (ZOCOR) 40 MG tablet Take 40 mg  by mouth at bedtime.   Marland Kitchen terazosin (HYTRIN) 5 MG capsule Take 5 mg by mouth at bedtime.  Marland Kitchen tiZANidine (ZANAFLEX) 4 MG tablet Take 4 mg by mouth at bedtime.  . [EXPIRED] cyanocobalamin ((VITAMIN B-12)) injection 1,000 mcg    No facility-administered encounter medications on file as of 12/25/2017.     ALLERGIES:  Allergies  Allergen Reactions  . Morphine And Related Other (See Comments)    confusion     PHYSICAL EXAM:  ECOG Performance status: 2  Vitals:   12/25/17 1329  BP: (!) 109/50  Pulse: 85  Resp: 16  SpO2: 96%   Filed Weights   12/25/17 1329  Weight: 212 lb 4.8 oz (96.3 kg)    Physical Exam  Constitutional: He is oriented to person, place, and time.  Cardiovascular: Normal rate, regular rhythm and normal heart sounds.  Pulmonary/Chest: Effort normal and breath sounds normal.  Neurological: He is alert and oriented to person, place, and time.  Skin: Skin is warm and dry.     LABORATORY DATA:  I have reviewed the labs as listed.  CBC    Component Value Date/Time   WBC 8.6 11/25/2017 0958   RBC 3.91 (L) 11/25/2017 0958   HGB 11.5 (L) 11/25/2017 0958   HCT 34.7 (L) 11/25/2017 0958   PLT 234 11/25/2017 0958   MCV 88.7 11/25/2017 0958   MCH 29.4 11/25/2017 0958   MCHC 33.1 11/25/2017 0958   RDW 13.3 11/25/2017 0958   LYMPHSABS 1.7 09/25/2017 0924   MONOABS 0.8 09/25/2017 0924   EOSABS 0.6 09/25/2017 0924   BASOSABS 0.0 09/25/2017 0924   CMP Latest Ref Rng & Units 11/25/2017 09/25/2017 09/11/2017  Glucose 65 - 99 mg/dL 177(H) 295(H) 222(H)  BUN 6 - 20 mg/dL '14 12 13  ' Creatinine 0.61 - 1.24 mg/dL 1.15 1.40(H) 1.27(H)  Sodium 135 - 145 mmol/L 133(L) 131(L) 135  Potassium 3.5 - 5.1 mmol/L 3.9 4.6 3.6  Chloride 101 - 111 mmol/L 100(L) 95(L) 100(L)  CO2 22 - 32 mmol/L '25 25 24  ' Calcium 8.9 - 10.3 mg/dL 8.7(L) 8.8(L) 8.5(L)  Total Protein 6.5 - 8.1 g/dL 6.2(L) 6.8 5.8(L)  Total Bilirubin 0.3 - 1.2 mg/dL 0.6 0.7 0.6  Alkaline Phos 38 - 126 U/L 68 97 71  AST 15  - 41 U/L '27 31 25  ' ALT 17 - 63 U/L 23 20 16(L)       DIAGNOSTIC IMAGING:  I discussed the results of the CT scan of the chest dated 12/18/2017 which shows small right pleural effusion, mildly increased with no recurrence or mets.    ASSESSMENT & PLAN:   Adenocarcinoma of right lung, stage 3 (HCC) 1.  Stage IIIa right lung adenocarcinoma: - Diagnosed in June 2017, status post right middle tubectomy, VATS with mediastinal node sampling, PDL 1 negative - Status post carboplatin and pemetrexed for 6 cycles completed on 05/31/2016 -Radiation therapy completed 08/01/2016 -Consolidation Durvalumab from 09/04/2016 through 08/28/2017 -We have discussed the results of the surveillance CT scan dated 12/18/2017 which shows small right pleural effusion, mildly increased.  No evidence of recurrence or metastatic disease.  We will continue to monitor chest CT with contrast every 3 to 6 months for 3 years followed by every 6 months for subsequent 2 years and then low-dose noncontrast CT annually. - He has fallen  twice in the last 1 week and then another 1-2 times in the last month.  He reports having balance problems when he suddenly turns his head to one side.  I have recommended doing an MRI of the brain with and without gadolinium to rule out any organic disease.  We plan to make a referral to neurology after the MRI.  2.  Dizziness: He is taking meclizine 3 times a day.  He is walking with the help of walker.  His last CT scan of brain in March did not show any brain metastasis.  However he is having balance problems and falling down.  Hence we have ordered MRI.      Orders placed this encounter:  Orders Placed This Encounter  Procedures  . MR Brain W Wo Contrast  . CBC with Differential/Platelet  . Comprehensive metabolic panel  . Lactate dehydrogenase  . TSH      Derek Jack, [place] [phone]

## 2017-12-25 NOTE — Patient Instructions (Signed)
[facility] at [facility]  Discharge Instructions:  You received a b12 shot today.  _______________________________________________________________  Thank you for choosing [facility] at [facility] to provide your oncology and hematology care.  To afford each patient quality time with our providers, please arrive at least 15 minutes before your scheduled appointment.  You need to re-schedule your appointment if you arrive 10 or more minutes late.  We strive to give you quality time with our providers, and arriving late affects you and other patients whose appointments are after yours.  Also, if you no show three or more times for appointments you may be dismissed from the clinic.  Again, thank you for choosing [facility] at [facility]. Our hope is that these requests will allow you access to exceptional care and in a timely manner. _______________________________________________________________  If you have questions after your visit, please contact our office at [phone] between the hours of 8:30 a.m. and 5:00 p.m. Voicemails left after 4:30 p.m. will not be returned until the following business day. _______________________________________________________________  For prescription refill requests, have your pharmacy contact our office. _______________________________________________________________  Recommendations made by the consultant and any test results will be sent to your referring physician. _______________________________________________________________ 

## 2017-12-25 NOTE — Assessment & Plan Note (Signed)
1.  Stage IIIa right lung adenocarcinoma: - Diagnosed in June 2017, status post right middle tubectomy, VATS with mediastinal node sampling, PDL 1 negative - Status post carboplatin and pemetrexed for 6 cycles completed on 05/31/2016 -Radiation therapy completed 08/01/2016 -Consolidation Durvalumab from 09/04/2016 through 08/28/2017 -We have discussed the results of the surveillance CT scan dated 12/18/2017 which shows small right pleural effusion, mildly increased.  No evidence of recurrence or metastatic disease.  We will continue to monitor chest CT with contrast every 3 to 6 months for 3 years followed by every 6 months for subsequent 2 years and then low-dose noncontrast CT annually. - He has fallen twice in the last 1 week and then another 1-2 times in the last month.  He reports having balance problems when he suddenly turns his head to one side.  I have recommended doing an MRI of the brain with and without gadolinium to rule out any organic disease.  We plan to make a referral to neurology after the MRI.  2.  Dizziness: He is taking meclizine 3 times a day.  He is walking with the help of walker.  His last CT scan of brain in March did not show any brain metastasis.  However he is having balance problems and falling down.  Hence we have ordered MRI.

## 2017-12-25 NOTE — Progress Notes (Signed)
Patient tolerated b12 shot with no complaints voiced.  Injection site clean and dry with no bruising or swelling noted at site.  Band aid applied.  VSS with discharge and left ambulatory with no s/s of distress noted.   

## 2018-01-02 ENCOUNTER — Ambulatory Visit (HOSPITAL_COMMUNITY)
Admission: RE | Admit: 2018-01-02 | Discharge: 2018-01-02 | Disposition: A | Discharge status: Home / Self Care | Payer: Medicare Other | Source: Ambulatory Visit | Attending: Nurse Practitioner | Admitting: Nurse Practitioner

## 2018-01-02 DIAGNOSIS — I739 Peripheral vascular disease, unspecified: Secondary | ICD-10-CM | POA: Diagnosis not present

## 2018-01-02 DIAGNOSIS — C3491 Malignant neoplasm of unspecified part of right bronchus or lung: Secondary | ICD-10-CM

## 2018-01-02 MED ORDER — GADOBENATE DIMEGLUMINE 529 MG/ML IV SOLN
20.0000 mL | Freq: Once | INTRAVENOUS | Status: AC | PRN
  Administered 2018-01-02: 20 mL via INTRAVENOUS

## 2018-01-08 ENCOUNTER — Other Ambulatory Visit: Payer: Self-pay

## 2018-01-08 ENCOUNTER — Encounter (HOSPITAL_COMMUNITY): Payer: Self-pay | Admitting: Hematology

## 2018-01-08 ENCOUNTER — Inpatient Hospital Stay (HOSPITAL_BASED_OUTPATIENT_CLINIC_OR_DEPARTMENT_OTHER): Payer: Medicare Other | Admitting: Hematology

## 2018-01-08 VITALS — BP 120/48 | HR 86 | Resp 18 | Wt 210.0 lb

## 2018-01-08 DIAGNOSIS — Z85118 Personal history of other malignant neoplasm of bronchus and lung: Secondary | ICD-10-CM

## 2018-01-08 DIAGNOSIS — I639 Cerebral infarction, unspecified: Secondary | ICD-10-CM | POA: Diagnosis not present

## 2018-01-08 DIAGNOSIS — J90 Pleural effusion, not elsewhere classified: Secondary | ICD-10-CM

## 2018-01-08 DIAGNOSIS — Z9221 Personal history of antineoplastic chemotherapy: Secondary | ICD-10-CM

## 2018-01-08 DIAGNOSIS — Z87891 Personal history of nicotine dependence: Secondary | ICD-10-CM

## 2018-01-08 DIAGNOSIS — E119 Type 2 diabetes mellitus without complications: Secondary | ICD-10-CM

## 2018-01-08 DIAGNOSIS — R42 Dizziness and giddiness: Secondary | ICD-10-CM

## 2018-01-08 DIAGNOSIS — C3491 Malignant neoplasm of unspecified part of right bronchus or lung: Secondary | ICD-10-CM

## 2018-01-08 DIAGNOSIS — Z923 Personal history of irradiation: Secondary | ICD-10-CM

## 2018-01-08 DIAGNOSIS — Z800 Family history of malignant neoplasm of digestive organs: Secondary | ICD-10-CM

## 2018-01-08 NOTE — Assessment & Plan Note (Signed)
1.  Stage IIIa right lung adenocarcinoma: - Diagnosed in June 2017, status post right middle tubectomy, VATS with mediastinal node sampling, PDL 1 negative - Status post carboplatin and pemetrexed for 6 cycles completed on 05/31/2016 -Radiation therapy completed 08/01/2016 -Consolidation Durvalumab from 09/04/2016 through 08/28/2017 -CT scan dated 12/18/2017 shows small right pleural effusion, mildly increased.  No evidence of recurrence or metastatic disease.  We will continue to monitor chest CT with contrast every 3 to 6 months for 3 years followed by every 6 months for subsequent 2 years and then low-dose noncontrast CT annually. - Due to his falls, we have done an MRI of the brain dated 12/18/2017.  It did not show any evidence of metastatic disease.  It did show chronic right cerebellar infarct which was small.  He was told to walk with the help of walker all the time. -She will come back in 4 months with repeat CT scans of the chest.  2.  Dizziness: He is taking meclizine 3 times a day.  He is walking with the help of walker.  His last CT scan of brain in March did not show any brain metastasis.  However he is having balance problems and falling down.  We discussed the results of the MRI as above.

## 2018-01-08 NOTE — Progress Notes (Signed)
[place], [place] [id number]   CLINIC:  Medical Oncology/Hematology  PCP:  Monico Blitz, MD [place] [address] [phone]   REASON FOR VISIT:  Follow-up for non small cell lung cancer  CURRENT THERAPY: Surveillance per NCCN guidlines  BRIEF ONCOLOGIC HISTORY:    Adenocarcinoma of right lung, stage 3 ([place])   11/10/2015 Imaging    13 mm spiculated lesion seen in right middle lobe.      11/30/2015 PET scan    Spiculated right middle lobe nodule is mildly hypermetabolic and most consistent with a stage IA adenocarcinoma.      12/30/2015 Surgery    Right middle lobectomy and node dissection      12/30/2015 Procedure    Right video-assisted thoracoscopy, Thoracoscopic right middle lobectomy, Mediastinal lymph node dissection, and On-Q local anesthetic catheter placement.      01/02/2016 Pathology Results    1. Lung, resection (segmental or lobe), Right Middle Lobe - INVASIVE ADENOCARCINOMA, WELL DIFFERENTIATED, SPANNING 1.2 CM. - THE SURGICAL RESECTION MARGINS ARE NEGATIVE FOR CARCINOMA. 2. Lymph node, biopsy, Level 12 - METASTATIC CARCINOMA IN 1 OF 1 LYMPH NODE (1/1). 3. Lymph node, biopsy, Level 12 #2 - METASTATIC CARCINOMA IN 1 OF 1 LYMPH NODE (1/1). 4. Lymph node, biopsy, Level 7 - METASTATIC CARCINOMA IN 1 OF 1 LYMPH NODE (1/1/). 5. Lymph node, biopsy, Level 7 #2 - METASTATIC CARCINOMA IN 1 OF 1 LYMPH NODE (1/1). 6. Lymph node, biopsy, Level 7 #3 - METASTATIC CARCINOMA IN 1 OF 1 LYMPH NODE (1/1). 7. Lymph node, biopsy, Level 7 #4 - METASTATIC CARCINOMA IN 1 OF 1 LYMPH NODE (1/1). 8. Lymph node, biopsy, 4R - THERE IS NO EVIDENCE OF CARCINOMA IN 1 OF 1 LYMPH NODE (0/1). 9. Lymph node, biopsy, 4R #2 - THERE IS NO EVIDENCE OF CARCINOMA IN 1 OF 1 LYMPH NODE (0/1).      01/05/2016 Pathology Results    PDL1 NEGATIVE- tumor proportion score of 0%.       01/05/2016 Pathology Results    Genomic alterations identified:  BRAF V600E, KIT amplification, PDGFRA amplification, CDKN2A/B loss, TP53 S76f*33.  Additional findings: MSI-STABLE.  No reportable alterations identified: EGFR, KRAS, ALK, MET, RET, ERBB2, ROS1      02/09/2016 Procedure    Port placed by IR.      02/16/2016 - 05/31/2016 Chemotherapy    Carboplatin/Pemetrexed x 6 cycles        - 08/01/2016 Radiation Therapy    [place], [place]      08/28/2016 Imaging    CT CAP- No evidence of recurrent or metastatic carcinoma within the chest, abdomen, or pelvis.  Stable incidental findings include a absent, small benign right adrenal adenoma, sigmoid diverticulosis, and aortic atherosclerosis.      09/04/2016 -  Chemotherapy    Imfinzi immunotherapy up to 52 weeks.       12/07/2016 Imaging    CT C/A/P: New focal streaky opacities within the peripheral posterior right lower lobe and anteromedial left upper lobe- likely atelectasis with infection or other process is less likely. Recommend attention to these areas on future scans.  Small right pleural effusion, slightly increased from 08/28/2016. No evidence of pleural mass or enhancement.  No evidence of malignancy/ metastatic disease within the abdomen or pelvis.       02/12/2017 Imaging    CT chest: IMPRESSION: 1. Stable CT of the chest. No specific findings identified to suggest residual or recurrence of tumor. 2.  Persistent right pleural effusion. 3. Paramediastinal radiation change predominantly involving the right lung is similar to previous study. 4. Stable right adrenal gland nodule and low-density foci within the liver. 5. Aortic Atherosclerosis (ICD10-I70.0) and Emphysema (ICD10-J43.9). Multi vessel coronary artery calcifications noted.        CANCER STAGING: Cancer Staging Adenocarcinoma of right lung, stage 3 (HCC) Staging form: Lung, AJCC 7th Edition - Clinical stage from 12/09/2015: Stage IA (T1a, N0, M0) - Signed by Baird Cancer, PA-C on 12/09/2015 - Pathologic  stage from 01/20/2016: Stage IIIA (T1a, N2, cM0) - Signed by Baird Cancer, PA-C on 02/01/2016    INTERVAL HISTORY:  [name] 84 y.o. male returns for routine follow-up for non small cell lung cancer. Patient is here today with his wife. Patient is doing well. He has occasional headaches. He is still having balance problems. He is using his walker all the time. He does try and get out of the house and walk for exercise. His appetite good at 100% he also drinks 2-3 Glucerna a week to help supplement. His energy levels rare at 50%. Otherwise is he feeling good. Denies any new pains. Denies any SOB or coughing. Denies any hemoptysis.     REVIEW OF SYSTEMS:  Review of Systems  Constitutional: Positive for chills and fatigue.  HENT:   Positive for trouble swallowing.   Eyes: Negative.   Respiratory: Negative.   Cardiovascular: Negative.   Gastrointestinal: Positive for constipation.  Endocrine: Negative.   Musculoskeletal: Negative.   Skin: Positive for itching.  Neurological: Positive for dizziness and numbness.  Hematological: Bruises/bleeds easily.  Psychiatric/Behavioral: The patient is nervous/anxious.      PAST MEDICAL/SURGICAL HISTORY:  Past Medical History:  Diagnosis Date  . Adenocarcinoma of lung, stage 3 (HCC)    Stage IIIA  . Adenocarcinoma of right lung, stage 3 ([place]) 12/02/2015  . Arthritis   . Atrial fibrillation, transient ([place])    transient postop, < 24 hours  . B12 deficiency   . Cyst of scrotum   . Diverticulitis   . GERD (gastroesophageal reflux disease)   . Hernia   . History of pneumonia   . Hyperlipidemia   . Lung nodule    Spiculated right middle lobe nodule, hypermetabolic  . Type 2 diabetes mellitus ([place])    Past Surgical History:  Procedure Laterality Date  . BACK SURGERY    . CHOLECYSTECTOMY    . COLONOSCOPY N/A 11/24/2015   Procedure: COLONOSCOPY;  Surgeon: Rogene Houston, MD;  Location: [facility];  Service: Endoscopy;  Laterality: N/A;   730  . COLONOSCOPY W/ POLYPECTOMY     x 5  . CYST EXCISION     Scrotum  . EYE SURGERY Bilateral    Cataract with Lens  . HERNIA REPAIR Right    Inguinal  . IR GENERIC HISTORICAL  02/09/2016   IR US GUIDE VASC ACCESS RIGHT 02/09/2016 Arne Cleveland, MD [facility]  . IR GENERIC HISTORICAL  02/09/2016   IR FLUORO GUIDE CV LINE RIGHT 02/09/2016 Arne Cleveland, MD [facility]  . LUMBAR DISC SURGERY     per patient report  . PORTACATH PLACEMENT    . VIDEO ASSISTED THORACOSCOPY (VATS)/ LOBECTOMY Right 12/30/2015   Procedure: VIDEO ASSISTED THORACOSCOPY (VATS)/RIGHT MIDDLE LOBECTOMY;  Surgeon: Melrose Nakayama, MD;  Location: [facility];  Service: Thoracic;  Laterality: Right;     SOCIAL HISTORY:  Social History   Socioeconomic History  . Marital status: Married    Spouse name:  Not on file  . Number of children: Not on file  . Years of education: Not on file  . Highest education level: Not on file  Occupational History  . Not on file  Social Needs  . Financial resource strain: Not on file  . Food insecurity:    Worry: Not on file    Inability: Not on file  . Transportation needs:    Medical: Not on file    Non-medical: Not on file  Tobacco Use  . Smoking status: Former Smoker    Types: Cigarettes    Last attempt to quit: 05/27/2004    Years since quitting: 13.6  . Smokeless tobacco: Never Used  Substance and Sexual Activity  . Alcohol use: No    Alcohol/week: 0.0 oz  . Drug use: No  . Sexual activity: Not on file    Comment: married  Lifestyle  . Physical activity:    Days per week: Not on file    Minutes per session: Not on file  . Stress: Not on file  Relationships  . Social connections:    Talks on phone: Not on file    Gets together: Not on file    Attends religious service: Not on file    Active member of club or organization: Not on file    Attends meetings of clubs or organizations: Not on file    Relationship status: Not on file  . Intimate partner  violence:    Fear of current or ex partner: Not on file    Emotionally abused: Not on file    Physically abused: Not on file    Forced sexual activity: Not on file  Other Topics Concern  . Not on file  Social History Narrative  . Not on file    FAMILY HISTORY:  Family History  Problem Relation Age of Onset  . Colon cancer Brother     CURRENT MEDICATIONS:  Outpatient Encounter Medications as of 01/08/2018  Medication Sig  . ACCU-CHEK AVIVA PLUS test strip USE 5 STRIPS DAILY TO MONITOR FLUCTUATING BLOOD SUGAR  . ACCU-CHEK FASTCLIX LANCETS MISC USE 6 LANCETS DAILY  . ALPRAZolam (XANAX) 0.5 MG tablet Take 0.5 mg by mouth daily.  Marland Kitchen aspirin EC 81 MG tablet Take 81 mg by mouth daily.  . calcium carbonate (OS-CAL - DOSED IN MG OF ELEMENTAL CALCIUM) 1250 (500 Ca) MG tablet Take 1 tablet (500 mg of elemental calcium total) by mouth daily with breakfast.  . Cholecalciferol (VITAMIN D) 2000 UNITS CAPS Take 1 capsule by mouth daily.   . Cyanocobalamin (VITAMIN B-12 IJ) Inject 1 Dose as directed every 30 (thirty) days.  Marland Kitchen glipiZIDE (GLUCOTROL) 5 MG tablet Take 1 tablet (5 mg total) by mouth 2 (two) times daily before a meal. (Patient taking differently: Take 2.5 mg by mouth 2 (two) times daily before a meal. )  . lidocaine-prilocaine (EMLA) cream Apply a quarter size amount to port site 1 hour prior to chemo. Do not rub in. Cover with plastic wrap.  . lisinopril (PRINIVIL,ZESTRIL) 40 MG tablet Take 1 tablet (40 mg total) by mouth daily.  Marland Kitchen loratadine (CLARITIN) 10 MG tablet Take 10 mg by mouth daily.  . meclizine (ANTIVERT) 25 MG tablet Take 25 mg by mouth 2 (two) times daily.   . ondansetron (ZOFRAN) 8 MG tablet Take 1 tablet (8 mg total) by mouth every 8 (eight) hours as needed for nausea or vomiting.  . pantoprazole (PROTONIX) 40 MG tablet Take 40 mg by mouth  2 (two) times daily.   . prochlorperazine (COMPAZINE) 10 MG tablet TAKE ONE TABLET BY MOUTH EVERY 6 HOURS AS NEEDED FOR NAUSEA AND  VOMITING  . simvastatin (ZOCOR) 40 MG tablet Take 40 mg by mouth at bedtime.   Marland Kitchen terazosin (HYTRIN) 5 MG capsule Take 5 mg by mouth at bedtime.  Marland Kitchen tiZANidine (ZANAFLEX) 4 MG tablet Take 4 mg by mouth at bedtime.   No facility-administered encounter medications on file as of 01/08/2018.     ALLERGIES:  Allergies  Allergen Reactions  . Morphine And Related Other (See Comments)    confusion     PHYSICAL EXAM:  ECOG Performance status: 1  Vitals:   01/08/18 1111  BP: (!) 120/48  Pulse: 86  Resp: 18  SpO2: 95%   Filed Weights   01/08/18 1111  Weight: 210 lb (95.3 kg)    Physical Exam  Constitutional: He is oriented to person, place, and time.  Cardiovascular: Normal rate, regular rhythm and normal heart sounds.  Pulmonary/Chest: Effort normal and breath sounds normal.  Neurological: He is alert and oriented to person, place, and time.  Skin: Skin is warm and dry.     LABORATORY DATA:  I have reviewed the labs as listed.  CBC    Component Value Date/Time   WBC 8.6 11/25/2017 0958   RBC 3.91 (L) 11/25/2017 0958   HGB 11.5 (L) 11/25/2017 0958   HCT 34.7 (L) 11/25/2017 0958   PLT 234 11/25/2017 0958   MCV 88.7 11/25/2017 0958   MCH 29.4 11/25/2017 0958   MCHC 33.1 11/25/2017 0958   RDW 13.3 11/25/2017 0958   LYMPHSABS 1.7 09/25/2017 0924   MONOABS 0.8 09/25/2017 0924   EOSABS 0.6 09/25/2017 0924   BASOSABS 0.0 09/25/2017 0924   CMP Latest Ref Rng & Units 11/25/2017 09/25/2017 09/11/2017  Glucose 65 - 99 mg/dL 177(H) 295(H) 222(H)  BUN 6 - 20 mg/dL _0 Creatinine 0.61 - 1.24 mg/dL 1.15 1.40(H) 1.27(H)  Sodium 135 - 145 mmol/L 133(L) 131(L) 135  Potassium 3.5 - 5.1 mmol/L 3.9 4.6 3.6  Chloride 101 - 111 mmol/L 100(L) 95(L) 100(L)  CO2 22 - 32 mmol/L _1 Calcium 8.9 - 10.3 mg/dL 8.7(L) 8.8(L) 8.5(L)  Total Protein 6.5 - 8.1 g/dL 6.2(L) 6.8 5.8(L)  Total Bilirubin 0.3 - 1.2 mg/dL 0.6 0.7 0.6  Alkaline Phos 38 - 126 U/L 68 97 71  AST 15 - 41 U/L _2 ALT 17 - 63 U/L 23 20 16(L)       DIAGNOSTIC IMAGING:  I have independently reviewed MRI of his brain and discussed with the patient and his daughter..      ASSESSMENT & PLAN:   Adenocarcinoma of right lung, stage 3 (HCC) 1.  Stage IIIa right lung adenocarcinoma: - Diagnosed in June 2017, status post right middle tubectomy, VATS with mediastinal node sampling, PDL 1 negative - Status post carboplatin and pemetrexed for 6 cycles completed on 05/31/2016 -Radiation therapy completed 08/01/2016 -Consolidation Durvalumab from 09/04/2016 through 08/28/2017 -CT scan dated 12/18/2017 shows small right pleural effusion, mildly increased.  No evidence of recurrence or metastatic disease.  We will continue to monitor chest CT with contrast every 3 to 6 months for 3 years followed by every 6 months for subsequent 2 years and then low-dose noncontrast CT annually. - Due to his falls, we have done an MRI of the brain dated 12/18/2017.  It did not show any evidence of metastatic disease.  It did show chronic right cerebellar infarct which was small.  He was told to walk with the help of walker all the time. -She will come back in 4 months with repeat CT scans of the chest.  2.  Dizziness: He is taking meclizine 3 times a day.  He is walking with the help of walker.  His last CT scan of brain in March did not show any brain metastasis.  However he is having balance problems and falling down.  We discussed the results of the MRI as above.      Orders placed this encounter:  Orders Placed This Encounter  Procedures  . CBC with Differential/Platelet  . Comprehensive metabolic panel  . TSH  . Lactate dehydrogenase      Derek Jack, MD [place] [phone]

## 2018-01-27 ENCOUNTER — Inpatient Hospital Stay (HOSPITAL_COMMUNITY): Payer: Medicare Other | Attending: Hematology and Oncology

## 2018-01-27 ENCOUNTER — Encounter (HOSPITAL_COMMUNITY): Payer: Self-pay

## 2018-01-27 VITALS — BP 139/51 | HR 100 | Temp 97.9°F | Resp 18 | Wt 209.1 lb

## 2018-01-27 DIAGNOSIS — J90 Pleural effusion, not elsewhere classified: Secondary | ICD-10-CM | POA: Insufficient documentation

## 2018-01-27 DIAGNOSIS — Z923 Personal history of irradiation: Secondary | ICD-10-CM | POA: Diagnosis not present

## 2018-01-27 DIAGNOSIS — Z87891 Personal history of nicotine dependence: Secondary | ICD-10-CM | POA: Diagnosis not present

## 2018-01-27 DIAGNOSIS — Z800 Family history of malignant neoplasm of digestive organs: Secondary | ICD-10-CM | POA: Diagnosis not present

## 2018-01-27 DIAGNOSIS — I639 Cerebral infarction, unspecified: Secondary | ICD-10-CM | POA: Diagnosis not present

## 2018-01-27 DIAGNOSIS — Z85118 Personal history of other malignant neoplasm of bronchus and lung: Secondary | ICD-10-CM | POA: Diagnosis present

## 2018-01-27 DIAGNOSIS — R42 Dizziness and giddiness: Secondary | ICD-10-CM | POA: Diagnosis not present

## 2018-01-27 DIAGNOSIS — E119 Type 2 diabetes mellitus without complications: Secondary | ICD-10-CM | POA: Diagnosis not present

## 2018-01-27 DIAGNOSIS — Z9221 Personal history of antineoplastic chemotherapy: Secondary | ICD-10-CM | POA: Insufficient documentation

## 2018-01-27 DIAGNOSIS — Z95828 Presence of other vascular implants and grafts: Secondary | ICD-10-CM

## 2018-01-27 DIAGNOSIS — C3491 Malignant neoplasm of unspecified part of right bronchus or lung: Secondary | ICD-10-CM

## 2018-01-27 DIAGNOSIS — E538 Deficiency of other specified B group vitamins: Secondary | ICD-10-CM

## 2018-01-27 MED ORDER — CYANOCOBALAMIN 1000 MCG/ML IJ SOLN
1000.0000 ug | Freq: Once | INTRAMUSCULAR | Status: AC
  Administered 2018-01-27: 1000 ug via INTRAMUSCULAR
  Filled 2018-01-27: qty 1

## 2018-01-27 MED ORDER — SODIUM CHLORIDE 0.9% FLUSH
10.0000 mL | Freq: Once | INTRAVENOUS | Status: AC
  Administered 2018-01-27: 10 mL via INTRAVENOUS

## 2018-01-27 MED ORDER — HEPARIN SOD (PORK) LOCK FLUSH 100 UNIT/ML IV SOLN
500.0000 [IU] | Freq: Once | INTRAVENOUS | Status: AC
  Administered 2018-01-27: 500 [IU] via INTRAVENOUS
  Filled 2018-01-27: qty 5

## 2018-01-27 NOTE — Patient Instructions (Signed)
[place] at [facility]  Discharge Instructions:  Your port was flushed today and a b12 shot was given.  Return as directed for next flush and shot.  _______________________________________________________________  Thank you for choosing [place] at [facility] to provide your oncology and hematology care.  To afford each patient quality time with our providers, please arrive at least 15 minutes before your scheduled appointment.  You need to re-schedule your appointment if you arrive 10 or more minutes late.  We strive to give you quality time with our providers, and arriving late affects you and other patients whose appointments are after yours.  Also, if you no show three or more times for appointments you may be dismissed from the clinic.  Again, thank you for choosing [place] at [place] hope is that these requests will allow you access to exceptional care and in a timely manner. _______________________________________________________________  If you have questions after your visit, please contact our office at [phone] between the hours of 8:30 a.m. and 5:00 p.m. Voicemails left after 4:30 p.m. will not be returned until the following business day. _______________________________________________________________  For prescription refill requests, have your pharmacy contact our office. _______________________________________________________________  Recommendations made by the consultant and any test results will be sent to your referring physician. _______________________________________________________________

## 2018-01-27 NOTE — Progress Notes (Signed)
Patient tolerated port flush and b12 shot with no problems or complaints voiced.  Good blood return noted with flush.  Site clean and dry.  B12 site clean and dry.   Bands aid applied.  VSs with discharge and left ambulatory with family with no s/s of distress noted.

## 2018-02-25 ENCOUNTER — Encounter (HOSPITAL_COMMUNITY): Payer: Medicare Other

## 2018-02-26 ENCOUNTER — Other Ambulatory Visit (HOSPITAL_COMMUNITY): Payer: Self-pay | Admitting: Internal Medicine

## 2018-02-27 ENCOUNTER — Inpatient Hospital Stay (HOSPITAL_COMMUNITY): Payer: Medicare Other | Attending: Hematology and Oncology

## 2018-02-27 ENCOUNTER — Encounter (HOSPITAL_COMMUNITY): Payer: Self-pay

## 2018-02-27 VITALS — BP 129/50 | HR 80 | Temp 97.6°F | Resp 18

## 2018-02-27 DIAGNOSIS — E538 Deficiency of other specified B group vitamins: Secondary | ICD-10-CM

## 2018-02-27 DIAGNOSIS — Z85118 Personal history of other malignant neoplasm of bronchus and lung: Secondary | ICD-10-CM | POA: Insufficient documentation

## 2018-02-27 MED ORDER — CYANOCOBALAMIN 1000 MCG/ML IJ SOLN
1000.0000 ug | Freq: Once | INTRAMUSCULAR | Status: AC
  Administered 2018-02-27: 1000 ug via INTRAMUSCULAR
  Filled 2018-02-27: qty 1

## 2018-02-27 NOTE — Patient Instructions (Signed)
[facility] at [facility]  Discharge Instructions:  You received a b12 shot today.  _______________________________________________________________  Thank you for choosing [facility] at [facility] to provide your oncology and hematology care.  To afford each patient quality time with our providers, please arrive at least 15 minutes before your scheduled appointment.  You need to re-schedule your appointment if you arrive 10 or more minutes late.  We strive to give you quality time with our providers, and arriving late affects you and other patients whose appointments are after yours.  Also, if you no show three or more times for appointments you may be dismissed from the clinic.  Again, thank you for choosing  [facility] at [facility]. Our hope is that these requests will allow you access to exceptional care and in a timely manner. _______________________________________________________________  If you have questions after your visit, please contact our office at [phone] between the hours of 8:30 a.m. and 5:00 p.m. Voicemails left after 4:30 p.m. will not be returned until the following business day. _______________________________________________________________  For prescription refill requests, have your pharmacy contact our office. _______________________________________________________________  Recommendations made by the consultant and any test results will be sent to your referring physician. _______________________________________________________________ 

## 2018-02-27 NOTE — Progress Notes (Signed)
Patient tolerated shot with no complaints voiced.  Injection site clean and dry with no bruising or swelling noted.  Band aid applied.  VSS with discharge and left ambulatory with wife with no s/s of distress noted.

## 2018-03-31 ENCOUNTER — Ambulatory Visit (HOSPITAL_COMMUNITY): Payer: Medicare Other

## 2018-03-31 ENCOUNTER — Encounter (HOSPITAL_COMMUNITY): Payer: Self-pay

## 2018-03-31 ENCOUNTER — Inpatient Hospital Stay (HOSPITAL_COMMUNITY): Payer: Medicare Other | Attending: Hematology and Oncology

## 2018-03-31 VITALS — BP 127/48 | HR 88 | Temp 98.0°F | Resp 18

## 2018-03-31 DIAGNOSIS — E538 Deficiency of other specified B group vitamins: Secondary | ICD-10-CM

## 2018-03-31 DIAGNOSIS — Z95828 Presence of other vascular implants and grafts: Secondary | ICD-10-CM

## 2018-03-31 DIAGNOSIS — Z23 Encounter for immunization: Secondary | ICD-10-CM | POA: Diagnosis not present

## 2018-03-31 DIAGNOSIS — Z85118 Personal history of other malignant neoplasm of bronchus and lung: Secondary | ICD-10-CM | POA: Diagnosis present

## 2018-03-31 MED ORDER — HEPARIN SOD (PORK) LOCK FLUSH 100 UNIT/ML IV SOLN
500.0000 [IU] | Freq: Once | INTRAVENOUS | Status: AC
  Administered 2018-03-31: 500 [IU] via INTRAVENOUS

## 2018-03-31 MED ORDER — SODIUM CHLORIDE 0.9% FLUSH
10.0000 mL | INTRAVENOUS | Status: DC | PRN
  Administered 2018-03-31: 10 mL via INTRAVENOUS
  Filled 2018-03-31: qty 10

## 2018-03-31 MED ORDER — INFLUENZA VAC SPLIT HIGH-DOSE 0.5 ML IM SUSY
0.5000 mL | PREFILLED_SYRINGE | INTRAMUSCULAR | Status: AC
  Administered 2018-03-31: 0.5 mL via INTRAMUSCULAR

## 2018-03-31 MED ORDER — HEPARIN SOD (PORK) LOCK FLUSH 100 UNIT/ML IV SOLN
INTRAVENOUS | Status: AC
  Filled 2018-03-31: qty 5

## 2018-03-31 MED ORDER — CYANOCOBALAMIN 1000 MCG/ML IJ SOLN
1000.0000 ug | Freq: Once | INTRAMUSCULAR | Status: AC
  Administered 2018-03-31: 1000 ug via INTRAMUSCULAR

## 2018-03-31 MED ORDER — INFLUENZA VAC SPLIT HIGH-DOSE 0.5 ML IM SUSY
PREFILLED_SYRINGE | INTRAMUSCULAR | Status: AC
  Filled 2018-03-31: qty 0.5

## 2018-03-31 MED ORDER — CYANOCOBALAMIN 1000 MCG/ML IJ SOLN
INTRAMUSCULAR | Status: AC
  Filled 2018-03-31: qty 1

## 2018-03-31 NOTE — Patient Instructions (Signed)
[place] at [facility] Discharge Instructions  Received Vit B12 and Influenza vaccine as well as Portacath flush today. Follow-up as scheduled. Call clinic for any questions or concerns   Thank you for choosing [place] at [facility] to provide your oncology and hematology care.  To afford each patient quality time with our provider, please arrive at least 15 minutes before your scheduled appointment time.   If you have a lab appointment with the [facility] please come in thru the  Main Entrance and check in at the main information desk  You need to re-schedule your appointment should you arrive 10 or more minutes late.  We strive to give you quality time with our providers, and arriving late affects you and other patients whose appointments are after yours.  Also, if you no show three or more times for appointments you may be dismissed from the clinic at the providers discretion.     Again, thank you for choosing [facility].  Our hope is that these requests will decrease the amount of time that you wait before being seen by our physicians.       _____________________________________________________________  Should you have questions after your visit to [facility], please contact our office at [phone] between the hours of 8:00 a.m. and 4:30 p.m.  Voicemails left after 4:00 p.m. will not be returned until the following business day.  For prescription refill requests, have your pharmacy contact our office and allow 72 hours.    Cancer Center Support Programs:   > Cancer Support Group  2nd Tuesday of the month 1pm-2pm, Journey Room

## 2018-03-31 NOTE — Progress Notes (Signed)
[name] tolerated Vit B12 and Influenza as well as portacath flush well without complaints or incident. VSS Port accessed with 20 gauge needle with blood return noted then flushed with 10 ml NS and 5 ml Heparin easily per protocol then de-accessed. Pt discharged self ambulatory using walker in satisfactory condition

## 2018-04-11 ENCOUNTER — Encounter (HOSPITAL_COMMUNITY): Payer: Self-pay

## 2018-04-11 ENCOUNTER — Other Ambulatory Visit: Payer: Self-pay

## 2018-04-11 ENCOUNTER — Emergency Department (HOSPITAL_COMMUNITY)
Admission: EM | Admit: 2018-04-11 | Discharge: 2018-04-11 | Disposition: A | Discharge status: Home / Self Care | Payer: Medicare Other | Attending: Emergency Medicine | Admitting: Emergency Medicine

## 2018-04-11 DIAGNOSIS — Z79899 Other long term (current) drug therapy: Secondary | ICD-10-CM | POA: Diagnosis not present

## 2018-04-11 DIAGNOSIS — Z7982 Long term (current) use of aspirin: Secondary | ICD-10-CM | POA: Insufficient documentation

## 2018-04-11 DIAGNOSIS — E119 Type 2 diabetes mellitus without complications: Secondary | ICD-10-CM | POA: Insufficient documentation

## 2018-04-11 DIAGNOSIS — Z87891 Personal history of nicotine dependence: Secondary | ICD-10-CM | POA: Diagnosis not present

## 2018-04-11 DIAGNOSIS — R197 Diarrhea, unspecified: Secondary | ICD-10-CM

## 2018-04-11 DIAGNOSIS — E876 Hypokalemia: Secondary | ICD-10-CM | POA: Diagnosis not present

## 2018-04-11 HISTORY — DX: Cerebral infarction, unspecified: I63.9

## 2018-04-11 LAB — CBC WITH DIFFERENTIAL/PLATELET
Abs Immature Granulocytes: 0.03 10*3/uL (ref 0.00–0.07)
Basophils Absolute: 0 10*3/uL (ref 0.0–0.1)
Basophils Relative: 0 %
Eosinophils Absolute: 0.1 10*3/uL (ref 0.0–0.5)
Eosinophils Relative: 1 %
HCT: 36.6 % — ABNORMAL LOW (ref 39.0–52.0)
Hemoglobin: 11.9 g/dL — ABNORMAL LOW (ref 13.0–17.0)
Immature Granulocytes: 0 %
Lymphocytes Relative: 12 %
Lymphs Abs: 1 10*3/uL (ref 0.7–4.0)
MCH: 29 pg (ref 26.0–34.0)
MCHC: 32.5 g/dL (ref 30.0–36.0)
MCV: 89.3 fL (ref 80.0–100.0)
Monocytes Absolute: 1 10*3/uL (ref 0.1–1.0)
Monocytes Relative: 12 %
Neutro Abs: 6.1 10*3/uL (ref 1.7–7.7)
Neutrophils Relative %: 75 %
Platelets: 192 10*3/uL (ref 150–400)
RBC: 4.1 MIL/uL — ABNORMAL LOW (ref 4.22–5.81)
RDW: 13.1 % (ref 11.5–15.5)
WBC: 8.3 10*3/uL (ref 4.0–10.5)
nRBC: 0 % (ref 0.0–0.2)

## 2018-04-11 LAB — URINALYSIS, ROUTINE W REFLEX MICROSCOPIC
Bilirubin Urine: NEGATIVE
Glucose, UA: NEGATIVE mg/dL
Ketones, ur: NEGATIVE mg/dL
Leukocytes, UA: NEGATIVE
Nitrite: NEGATIVE
Protein, ur: NEGATIVE mg/dL
Specific Gravity, Urine: 1.004 — ABNORMAL LOW (ref 1.005–1.030)
pH: 6 (ref 5.0–8.0)

## 2018-04-11 LAB — COMPREHENSIVE METABOLIC PANEL
ALT: 23 U/L (ref 0–44)
AST: 42 U/L — ABNORMAL HIGH (ref 15–41)
Albumin: 3.3 g/dL — ABNORMAL LOW (ref 3.5–5.0)
Alkaline Phosphatase: 60 U/L (ref 38–126)
Anion gap: 12 (ref 5–15)
BUN: 12 mg/dL (ref 8–23)
CO2: 20 mmol/L — ABNORMAL LOW (ref 22–32)
Calcium: 7.7 mg/dL — ABNORMAL LOW (ref 8.9–10.3)
Chloride: 101 mmol/L (ref 98–111)
Creatinine, Ser: 1.25 mg/dL — ABNORMAL HIGH (ref 0.61–1.24)
GFR calc Af Amer: 59 mL/min — ABNORMAL LOW (ref >60)
GFR calc non Af Amer: 51 mL/min — ABNORMAL LOW (ref >60)
Glucose, Bld: 194 mg/dL — ABNORMAL HIGH (ref 70–99)
Potassium: 2.8 mmol/L — ABNORMAL LOW (ref 3.5–5.1)
Sodium: 133 mmol/L — ABNORMAL LOW (ref 135–145)
Total Bilirubin: 0.9 mg/dL (ref 0.3–1.2)
Total Protein: 6.3 g/dL — ABNORMAL LOW (ref 6.5–8.1)

## 2018-04-11 LAB — MAGNESIUM: Magnesium: 1.6 mg/dL — ABNORMAL LOW (ref 1.7–2.4)

## 2018-04-11 MED ORDER — POTASSIUM CHLORIDE 10 MEQ/100ML IV SOLN
10.0000 meq | INTRAVENOUS | Status: AC
  Administered 2018-04-11 (×2): 10 meq via INTRAVENOUS
  Filled 2018-04-11 (×2): qty 100

## 2018-04-11 MED ORDER — SODIUM CHLORIDE 0.9 % IV BOLUS
500.0000 mL | Freq: Once | INTRAVENOUS | Status: AC
  Administered 2018-04-11: 500 mL via INTRAVENOUS

## 2018-04-11 MED ORDER — POTASSIUM CHLORIDE CRYS ER 20 MEQ PO TBCR: 20.0000 meq | EXTENDED_RELEASE_TABLET | Freq: Two times a day (BID) | ORAL | 0 refills | Status: DC

## 2018-04-11 MED ORDER — ONDANSETRON HCL 4 MG/2ML IJ SOLN
4.0000 mg | Freq: Once | INTRAMUSCULAR | Status: AC
  Administered 2018-04-11: 4 mg via INTRAVENOUS
  Filled 2018-04-11: qty 2

## 2018-04-11 MED ORDER — LOPERAMIDE HCL 2 MG PO CAPS
4.0000 mg | ORAL_CAPSULE | Freq: Once | ORAL | Status: AC
  Administered 2018-04-11: 4 mg via ORAL
  Filled 2018-04-11: qty 2

## 2018-04-11 MED ORDER — SODIUM CHLORIDE 0.9 % IV SOLN
INTRAVENOUS | Status: DC | PRN
  Administered 2018-04-11: 10:00:00 via INTRAVENOUS

## 2018-04-11 MED ORDER — POTASSIUM CHLORIDE CRYS ER 20 MEQ PO TBCR
60.0000 meq | EXTENDED_RELEASE_TABLET | Freq: Once | ORAL | Status: AC
  Administered 2018-04-11: 60 meq via ORAL
  Filled 2018-04-11: qty 3

## 2018-04-11 MED ORDER — HEPARIN SOD (PORK) LOCK FLUSH 100 UNIT/ML IV SOLN
500.0000 [IU] | Freq: Once | INTRAVENOUS | Status: AC
  Administered 2018-04-11: 500 [IU]
  Filled 2018-04-11: qty 5

## 2018-04-11 NOTE — ED Notes (Signed)
Got patient dressed and in wheelchair. Wife insisted on helping patient to restroom. Refused any assistance from me.

## 2018-04-11 NOTE — ED Notes (Signed)
Pt attempted to give urine sample, but was unsuccessful.

## 2018-04-11 NOTE — ED Notes (Signed)
Pt was informed that we need a urine sample. Pt states that he can not urinate at this time. Pt was given a urinal.

## 2018-04-11 NOTE — ED Triage Notes (Signed)
Pts wife reports diarrhea x 4 days. Wife reports one episode of vomiting. Reports generalized pain

## 2018-04-11 NOTE — ED Provider Notes (Signed)
[facility] EMERGENCY DEPARTMENT Provider Note   CSN: [id number] Arrival date & time: 04/11/18  0730     History   Chief Complaint Chief Complaint  Patient presents with  . Diarrhea    HPI [name] is a 84 y.o. male.  HPI   84 year old male with nausea, vomiting diarrhea.  Symptoms started Monday.  Persistent since then.  Feeling nauseated and not much of an appetite.  Vomited once.  Wife try giving him Compazine he has had no further vomiting but still is complaining of nausea.  He feels somewhat distended.  He denies any significant abdominal pain.  No fevers or chills.  Anytime he eats anything he reports that he gets diarrhea right away.  No blood or melena.  No sick contacts.  No fever.  Past Medical History:  Diagnosis Date  . Adenocarcinoma of lung, stage 3 (HCC)    Stage IIIA  . Adenocarcinoma of right lung, stage 3 ([place]) 12/02/2015  . Arthritis   . Atrial fibrillation, transient ([place])    transient postop, < 24 hours  . B12 deficiency   . Cyst of scrotum   . Diverticulitis   . GERD (gastroesophageal reflux disease)   . Hernia   . History of pneumonia   . Hyperlipidemia   . Lung nodule    Spiculated right middle lobe nodule, hypermetabolic  . Stroke ([place])   . Type 2 diabetes mellitus [facility])     Patient Active Problem List   Diagnosis Date Noted  . Atrial fibrillation, transient ([place]) 01/20/2016  . S/P lobectomy of lung 12/30/2015  . History of tobacco abuse 12/14/2015  . B12 deficiency 12/08/2015  . History of colonic polyps 12/02/2015  . Adenocarcinoma of right lung, stage 3 ([place]) 12/02/2015  . High cholesterol 11/03/2015  . Dysphagia, pharyngoesophageal phase 07/30/2012  . Diabetes ([place]) 07/30/2012    Past Surgical History:  Procedure Laterality Date  . BACK SURGERY    . CHOLECYSTECTOMY    . COLONOSCOPY N/A 11/24/2015   Procedure: COLONOSCOPY;  Surgeon: Rogene Houston, MD;  [facility];  Service: Endoscopy;  Laterality: N/A;  730    . COLONOSCOPY W/ POLYPECTOMY     x 5  . CYST EXCISION     Scrotum  . EYE SURGERY Bilateral    Cataract with Lens  . HERNIA REPAIR Right    Inguinal  . IR GENERIC HISTORICAL  02/09/2016   IR US GUIDE VASC ACCESS RIGHT 02/09/2016 Arne Cleveland, MD [facility]  . IR GENERIC HISTORICAL  02/09/2016   IR FLUORO GUIDE CV LINE RIGHT 02/09/2016 Arne Cleveland, MD [facility]  . LUMBAR DISC SURGERY     per patient report  . PORTACATH PLACEMENT    . VIDEO ASSISTED THORACOSCOPY (VATS)/ LOBECTOMY Right 12/30/2015   Procedure: VIDEO ASSISTED THORACOSCOPY (VATS)/RIGHT MIDDLE LOBECTOMY;  Surgeon: Melrose Nakayama, MD;  Location: [place];  Service: Thoracic;  Laterality: Right;        Home Medications    Prior to Admission medications   Medication Sig Start Date End Date Taking? Authorizing Provider  ACCU-CHEK AVIVA PLUS test strip USE 5 STRIPS DAILY TO MONITOR FLUCTUATING BLOOD SUGAR 11/13/17   [provider]  ACCU-CHEK FASTCLIX LANCETS MISC USE 6 LANCETS DAILY 11/13/17   [provider]  ALPRAZolam Duanne Moron) 0.5 MG tablet Take 0.5 mg by mouth daily.    [provider]  aspirin EC 81 MG tablet Take 81 mg by mouth daily.    [provider]  calcium carbonate (OS-CAL - DOSED IN MG OF ELEMENTAL CALCIUM) 1250 (500 Ca) MG tablet Take 1 tablet (500 mg of elemental calcium total) by mouth daily with breakfast. 04/19/16   Baird Cancer, PA-C  Cholecalciferol (VITAMIN D) 2000 UNITS CAPS Take 1 capsule by mouth daily.     [provider]  Cyanocobalamin (VITAMIN B-12 IJ) Inject 1 Dose as directed every 30 (thirty) days.    [provider]  glipiZIDE (GLUCOTROL) 5 MG tablet Take 1 tablet (5 mg total) by mouth 2 (two) times daily before a meal. Patient taking differently: Take 2.5 mg by mouth 2 (two) times daily before a meal.  01/05/16   Gold, Patrick Jupiter E, PA-C  lidocaine-prilocaine (EMLA) cream Apply a quarter size amount to port site 1 hour prior to chemo.  Do not rub in. Cover with plastic wrap. 02/01/16   Penland, Kelby Fam, MD  lisinopril (PRINIVIL,ZESTRIL) 40 MG tablet Take 1 tablet (40 mg total) by mouth daily. 01/05/16   Gold, Wayne E, PA-C  loratadine (CLARITIN) 10 MG tablet Take 10 mg by mouth daily.    [provider]  meclizine (ANTIVERT) 25 MG tablet Take 25 mg by mouth 2 (two) times daily.     [provider]  ondansetron (ZOFRAN) 8 MG tablet Take 1 tablet (8 mg total) by mouth every 8 (eight) hours as needed for nausea or vomiting. 02/01/16   Penland, Kelby Fam, MD  pantoprazole (PROTONIX) 40 MG tablet Take 40 mg by mouth 2 (two) times daily.     [provider]  prochlorperazine (COMPAZINE) 10 MG tablet TAKE ONE TABLET BY MOUTH EVERY 6 HOURS AS NEEDED FOR NAUSEA AND VOMITING 01/31/17   Holley Bouche, NP  simvastatin (ZOCOR) 40 MG tablet Take 40 mg by mouth at bedtime.     [provider]  terazosin (HYTRIN) 5 MG capsule Take 5 mg by mouth at bedtime.    [provider]  tiZANidine (ZANAFLEX) 4 MG tablet Take 4 mg by mouth at bedtime.    [provider]    Family History Family History  Problem Relation Age of Onset  . Colon cancer Brother     Social History Social History   Tobacco Use  . Smoking status: Former Smoker    Types: Cigarettes    Last attempt to quit: 05/27/2004    Years since quitting: 13.8  . Smokeless tobacco: Never Used  Substance Use Topics  . Alcohol use: No    Alcohol/week: 0.0 standard drinks  . Drug use: No     Allergies   Morphine and related   Review of Systems Review of Systems  All systems reviewed and negative, other than as noted in HPI.  Physical Exam Updated Vital Signs BP 135/80 (BP Location: Left Arm)   Pulse (!) 103   Temp 97.6 F (36.4 C)   Resp (!) 21   Ht 5\' 11"  (1.803 m)   Wt 95.3 kg   SpO2 95%   BMI 29.29 kg/m   Physical Exam  Constitutional: He appears well-developed and well-nourished. No distress.    Appears somewhat tired, but not toxic  HENT:  Head: Normocephalic and atraumatic.  Eyes: Conjunctivae are normal. Right eye exhibits no discharge. Left eye exhibits no discharge.  Neck: Neck supple.  Cardiovascular: Normal rate, regular rhythm and normal heart sounds. Exam reveals no gallop and no friction rub.  No murmur heard. Pulmonary/Chest: Effort normal and breath sounds normal. No respiratory distress.  Abdominal: Soft. He exhibits no distension. There is no tenderness.  Musculoskeletal: He exhibits no edema or tenderness.  Neurological: He is alert.  Skin: Skin is warm and dry.  Psychiatric: He has a normal mood and affect. His behavior is normal. Thought content normal.  Nursing note and vitals reviewed.    ED Treatments / Results  Labs (all labs ordered are listed, but only abnormal results are displayed) Labs Reviewed  COMPREHENSIVE METABOLIC PANEL - Abnormal; Notable for the following components:      Result Value   Sodium 133 (*)    Potassium 2.8 (*)    CO2 20 (*)    Glucose, Bld 194 (*)    Creatinine, Ser 1.25 (*)    Calcium 7.7 (*)    Total Protein 6.3 (*)    Albumin 3.3 (*)    AST 42 (*)    GFR calc non Af Amer 51 (*)    GFR calc Af Amer 59 (*)    All other components within normal limits  URINALYSIS, ROUTINE W REFLEX MICROSCOPIC - Abnormal; Notable for the following components:   Specific Gravity, Urine 1.004 (*)    Hgb urine dipstick MODERATE (*)    Bacteria, UA RARE (*)    All other components within normal limits  CBC WITH DIFFERENTIAL/PLATELET - Abnormal; Notable for the following components:   RBC 4.10 (*)    Hemoglobin 11.9 (*)    HCT 36.6 (*)    All other components within normal limits  MAGNESIUM - Abnormal; Notable for the following components:   Magnesium 1.6 (*)    All other components within normal limits    EKG None  Radiology No results found.  Procedures Procedures (including critical care time)  Medications Ordered in  ED Medications  sodium chloride 0.9 % bolus 500 mL (has no administration in time range)  loperamide (IMODIUM) capsule 4 mg (has no administration in time range)  ondansetron (ZOFRAN) injection 4 mg (has no administration in time range)     Initial Impression / Assessment and Plan / ED Course  I have reviewed the triage vital signs and the nursing notes.  Pertinent labs & imaging results that were available during my care of the patient were reviewed by me and considered in my medical decision making (see chart for details).     87yM with diarrhea. Abdominal exam benign. Afebrile. No blood in stool. No diarrhea while in the ED. Says he is feeling better. Hypokalemia. Supplemented. Given some IVF. I suspect viral GI illness. PRN imodium. Diet discussed. A few days of supplemental K. Outpt FU.   Final Clinical Impressions(s) / ED Diagnoses   Final diagnoses:  Diarrhea, unspecified type  Hypokalemia    ED Discharge Orders         Ordered    potassium chloride SA (K-DUR,KLOR-CON) 20 MEQ tablet  2 times daily,   Status:  Discontinued     04/11/18 1244    potassium chloride SA (K-DUR,KLOR-CON) 20 MEQ tablet  2 times daily     04/11/18 1312           Virgel Manifold, MD 04/12/18 1546

## 2018-04-24 ENCOUNTER — Inpatient Hospital Stay (HOSPITAL_COMMUNITY): Payer: Medicare Other | Attending: Hematology

## 2018-04-24 DIAGNOSIS — E538 Deficiency of other specified B group vitamins: Secondary | ICD-10-CM | POA: Insufficient documentation

## 2018-04-24 DIAGNOSIS — N189 Chronic kidney disease, unspecified: Secondary | ICD-10-CM | POA: Insufficient documentation

## 2018-04-24 DIAGNOSIS — I639 Cerebral infarction, unspecified: Secondary | ICD-10-CM | POA: Diagnosis not present

## 2018-04-24 DIAGNOSIS — C3491 Malignant neoplasm of unspecified part of right bronchus or lung: Secondary | ICD-10-CM

## 2018-04-24 DIAGNOSIS — Z85118 Personal history of other malignant neoplasm of bronchus and lung: Secondary | ICD-10-CM | POA: Insufficient documentation

## 2018-04-24 DIAGNOSIS — D649 Anemia, unspecified: Secondary | ICD-10-CM | POA: Insufficient documentation

## 2018-04-24 DIAGNOSIS — Z87891 Personal history of nicotine dependence: Secondary | ICD-10-CM | POA: Insufficient documentation

## 2018-04-24 DIAGNOSIS — R296 Repeated falls: Secondary | ICD-10-CM | POA: Diagnosis not present

## 2018-04-24 LAB — COMPREHENSIVE METABOLIC PANEL
ALT: 27 U/L (ref 0–44)
AST: 25 U/L (ref 15–41)
Albumin: 3.2 g/dL — ABNORMAL LOW (ref 3.5–5.0)
Alkaline Phosphatase: 73 U/L (ref 38–126)
Anion gap: 7 (ref 5–15)
BUN: 10 mg/dL (ref 8–23)
CO2: 25 mmol/L (ref 22–32)
Calcium: 8.6 mg/dL — ABNORMAL LOW (ref 8.9–10.3)
Chloride: 103 mmol/L (ref 98–111)
Creatinine, Ser: 1.16 mg/dL (ref 0.61–1.24)
GFR calc Af Amer: >60 mL/min (ref >60)
GFR calc non Af Amer: 56 mL/min — ABNORMAL LOW (ref >60)
Glucose, Bld: 92 mg/dL (ref 70–99)
Potassium: 3.9 mmol/L (ref 3.5–5.1)
Sodium: 135 mmol/L (ref 135–145)
Total Bilirubin: 0.6 mg/dL (ref 0.3–1.2)
Total Protein: 7 g/dL (ref 6.5–8.1)

## 2018-04-24 LAB — CBC WITH DIFFERENTIAL/PLATELET
Abs Immature Granulocytes: 0.04 10*3/uL (ref 0.00–0.07)
Basophils Absolute: 0.1 10*3/uL (ref 0.0–0.1)
Basophils Relative: 1 %
Eosinophils Absolute: 0.3 10*3/uL (ref 0.0–0.5)
Eosinophils Relative: 4 %
HCT: 33.2 % — ABNORMAL LOW (ref 39.0–52.0)
Hemoglobin: 10.5 g/dL — ABNORMAL LOW (ref 13.0–17.0)
Immature Granulocytes: 1 %
Lymphocytes Relative: 21 %
Lymphs Abs: 1.8 10*3/uL (ref 0.7–4.0)
MCH: 29.2 pg (ref 26.0–34.0)
MCHC: 31.6 g/dL (ref 30.0–36.0)
MCV: 92.5 fL (ref 80.0–100.0)
Monocytes Absolute: 0.7 10*3/uL (ref 0.1–1.0)
Monocytes Relative: 8 %
Neutro Abs: 5.5 10*3/uL (ref 1.7–7.7)
Neutrophils Relative %: 65 %
Platelets: 446 10*3/uL — ABNORMAL HIGH (ref 150–400)
RBC: 3.59 MIL/uL — ABNORMAL LOW (ref 4.22–5.81)
RDW: 12.9 % (ref 11.5–15.5)
WBC: 8.4 10*3/uL (ref 4.0–10.5)
nRBC: 0 % (ref 0.0–0.2)

## 2018-04-24 LAB — LACTATE DEHYDROGENASE: LDH: 139 U/L (ref 98–192)

## 2018-04-24 LAB — TSH: TSH: 2.599 u[IU]/mL (ref 0.350–4.500)

## 2018-05-01 ENCOUNTER — Inpatient Hospital Stay (HOSPITAL_BASED_OUTPATIENT_CLINIC_OR_DEPARTMENT_OTHER): Payer: Medicare Other | Admitting: Hematology

## 2018-05-01 ENCOUNTER — Inpatient Hospital Stay (HOSPITAL_COMMUNITY): Payer: Medicare Other

## 2018-05-01 ENCOUNTER — Encounter (HOSPITAL_COMMUNITY): Payer: Self-pay | Admitting: Hematology

## 2018-05-01 VITALS — BP 124/57 | HR 84 | Temp 97.5°F | Resp 18 | Wt 204.0 lb

## 2018-05-01 DIAGNOSIS — D649 Anemia, unspecified: Secondary | ICD-10-CM

## 2018-05-01 DIAGNOSIS — C3491 Malignant neoplasm of unspecified part of right bronchus or lung: Secondary | ICD-10-CM

## 2018-05-01 DIAGNOSIS — Z87891 Personal history of nicotine dependence: Secondary | ICD-10-CM

## 2018-05-01 DIAGNOSIS — R296 Repeated falls: Secondary | ICD-10-CM

## 2018-05-01 DIAGNOSIS — Z85118 Personal history of other malignant neoplasm of bronchus and lung: Secondary | ICD-10-CM

## 2018-05-01 DIAGNOSIS — E538 Deficiency of other specified B group vitamins: Secondary | ICD-10-CM

## 2018-05-01 DIAGNOSIS — J90 Pleural effusion, not elsewhere classified: Secondary | ICD-10-CM | POA: Diagnosis not present

## 2018-05-01 DIAGNOSIS — N189 Chronic kidney disease, unspecified: Secondary | ICD-10-CM

## 2018-05-01 DIAGNOSIS — I639 Cerebral infarction, unspecified: Secondary | ICD-10-CM

## 2018-05-01 MED ORDER — CYANOCOBALAMIN 1000 MCG/ML IJ SOLN
1000.0000 ug | Freq: Once | INTRAMUSCULAR | Status: AC
  Administered 2018-05-01: 1000 ug via INTRAMUSCULAR

## 2018-05-01 NOTE — Progress Notes (Signed)
[name] presents today for injection per the provider's orders.  B12 administration without incident; see MAR for injection details.  Patient tolerated procedure well and without incident.  No questions or complaints noted at this time.  Discharged ambulatory in c/o spouse.

## 2018-05-01 NOTE — Assessment & Plan Note (Signed)
1.  Stage IIIa right lung adenocarcinoma: - Diagnosed in June 2017, status post right middle tubectomy, VATS with mediastinal node sampling, PDL 1 negative - Status post carboplatin and pemetrexed for 6 cycles completed on 05/31/2016 -Radiation therapy completed 08/01/2016 -Consolidation Durvalumab from 09/04/2016 through 08/28/2017 -CT scan dated 12/18/2017 shows small right pleural effusion, mildly increased.  No evidence of recurrence or metastatic disease.  We will continue to monitor chest CT with contrast every 3 to 6 months for 3 years followed by every 6 months for subsequent 2 years and then low-dose noncontrast CT annually. - Due to his falls, we have done an MRI of the brain dated 12/18/2017.  It did not show any evidence of metastatic disease.  It did show chronic right cerebellar infarct which was small.  He was told to walk with the help of walker all the time. -Denies any change in his baseline cough.  Denies any hemoptysis.  He missed his CT scan of the chest. -I will schedule him for his CT of the chest and see him back after that.  2.  Normocytic anemia: -His hemoglobin dropped by 1 g.  He has mild CKD. -We will check his ferritin, iron panel, W09 and folic acid levels.

## 2018-05-01 NOTE — Progress Notes (Signed)
[place], [place] [address]   CLINIC:  Medical Oncology/Hematology  PCP:  Monico Blitz, [place] [address] [phone]   REASON FOR VISIT: Follow-up for stage 3 adenocarcinoma of the right lung  CURRENT THERAPY: Surveillance per NCCN guidlines  BRIEF ONCOLOGIC HISTORY:    Adenocarcinoma of right lung, stage 3 ([place])   11/10/2015 Imaging    13 mm spiculated lesion seen in right middle lobe.    11/30/2015 PET scan    Spiculated right middle lobe nodule is mildly hypermetabolic and most consistent with a stage IA adenocarcinoma.    12/30/2015 Surgery    Right middle lobectomy and node dissection    12/30/2015 Procedure    Right video-assisted thoracoscopy, Thoracoscopic right middle lobectomy, Mediastinal lymph node dissection, and On-Q local anesthetic catheter placement.    01/02/2016 Pathology Results    1. Lung, resection (segmental or lobe), Right Middle Lobe - INVASIVE ADENOCARCINOMA, WELL DIFFERENTIATED, SPANNING 1.2 CM. - THE SURGICAL RESECTION MARGINS ARE NEGATIVE FOR CARCINOMA. 2. Lymph node, biopsy, Level 12 - METASTATIC CARCINOMA IN 1 OF 1 LYMPH NODE (1/1). 3. Lymph node, biopsy, Level 12 #2 - METASTATIC CARCINOMA IN 1 OF 1 LYMPH NODE (1/1). 4. Lymph node, biopsy, Level 7 - METASTATIC CARCINOMA IN 1 OF 1 LYMPH NODE (1/1/). 5. Lymph node, biopsy, Level 7 #2 - METASTATIC CARCINOMA IN 1 OF 1 LYMPH NODE (1/1). 6. Lymph node, biopsy, Level 7 #3 - METASTATIC CARCINOMA IN 1 OF 1 LYMPH NODE (1/1). 7. Lymph node, biopsy, Level 7 #4 - METASTATIC CARCINOMA IN 1 OF 1 LYMPH NODE (1/1). 8. Lymph node, biopsy, 4R - THERE IS NO EVIDENCE OF CARCINOMA IN 1 OF 1 LYMPH NODE (0/1). 9. Lymph node, biopsy, 4R #2 - THERE IS NO EVIDENCE OF CARCINOMA IN 1 OF 1 LYMPH NODE (0/1).    01/05/2016 Pathology Results    PDL1 NEGATIVE- tumor proportion score of 0%.     01/05/2016 Pathology Results    Genomic alterations identified: BRAF  V600E, KIT amplification, PDGFRA amplification, CDKN2A/B loss, TP53 S81f*33.  Additional findings: MSI-STABLE.  No reportable alterations identified: EGFR, KRAS, ALK, MET, RET, ERBB2, ROS1    02/09/2016 Procedure    Port placed by IR.    02/16/2016 - 05/31/2016 Chemotherapy    Carboplatin/Pemetrexed x 6 cycles      - 08/01/2016 Radiation Therapy    [place], [place]    08/28/2016 Imaging    CT CAP- No evidence of recurrent or metastatic carcinoma within the chest, abdomen, or pelvis.  Stable incidental findings include a absent, small benign right adrenal adenoma, sigmoid diverticulosis, and aortic atherosclerosis.    09/04/2016 -  Chemotherapy    Imfinzi immunotherapy up to 52 weeks.     12/07/2016 Imaging    CT C/A/P: New focal streaky opacities within the peripheral posterior right lower lobe and anteromedial left upper lobe- likely atelectasis with infection or other process is less likely. Recommend attention to these areas on future scans.  Small right pleural effusion, slightly increased from 08/28/2016. No evidence of pleural mass or enhancement.  No evidence of malignancy/ metastatic disease within the abdomen or pelvis.     02/12/2017 Imaging    CT chest: IMPRESSION: 1. Stable CT of the chest. No specific findings identified to suggest residual or recurrence of tumor. 2. Persistent right pleural effusion. 3. Paramediastinal radiation change predominantly involving the right lung is similar to previous study. 4. Stable right adrenal gland nodule and  low-density foci within the liver. 5. Aortic Atherosclerosis (ICD10-I70.0) and Emphysema (ICD10-J43.9). Multi vessel coronary artery calcifications noted.      CANCER STAGING: Cancer Staging Adenocarcinoma of right lung, stage 3 (HCC) Staging form: Lung, AJCC 7th Edition - Clinical stage from 12/09/2015: Stage IA (T1a, N0, M0) - Signed by Baird Cancer, PA-C on 12/09/2015 - Pathologic stage from 01/20/2016: Stage IIIA  (T1a, N2, cM0) - Signed by Baird Cancer, PA-C on 02/01/2016    INTERVAL HISTORY:  [name] 84 y.o. male returns for routine follow-up non small cell lung cancer. Patient is here today with his family. He recently had a virus and had vomiting and diarrhea for days. He ended up in the ER for fluids and electrolytes. He is feeling better now just still has some weakness. He denies any GI issues now. Denies any new pain. Denies SOB or CP. Denies any fevers. He reports his appetite at 100% and he has no problem maintaining his weight. His energy level is 50%. He lives with family and they help him with any activities he may need.     REVIEW OF SYSTEMS:  Review of Systems  Neurological: Positive for extremity weakness and headaches.  All other systems reviewed and are negative.    PAST MEDICAL/SURGICAL HISTORY:  Past Medical History:  Diagnosis Date  . Adenocarcinoma of lung, stage 3 (HCC)    Stage IIIA  . Adenocarcinoma of right lung, stage 3 ([place]) 12/02/2015  . Arthritis   . Atrial fibrillation, transient ([place])    transient postop, < 24 hours  . B12 deficiency   . Cyst of scrotum   . Diverticulitis   . GERD (gastroesophageal reflux disease)   . Hernia   . History of pneumonia   . Hyperlipidemia   . Lung nodule    Spiculated right middle lobe nodule, hypermetabolic  . Stroke ([place])   . Type 2 diabetes mellitus ([place])    Past Surgical History:  Procedure Laterality Date  . BACK SURGERY    . CHOLECYSTECTOMY    . COLONOSCOPY N/A 11/24/2015   Procedure: COLONOSCOPY;  Surgeon: Rogene Houston, MD;  [facility];  Service: Endoscopy;  Laterality: N/A;  730  . COLONOSCOPY W/ POLYPECTOMY     x 5  . CYST EXCISION     Scrotum  . EYE SURGERY Bilateral    Cataract with Lens  . HERNIA REPAIR Right    Inguinal  . IR GENERIC HISTORICAL  02/09/2016   IR US GUIDE VASC ACCESS RIGHT 02/09/2016 Arne Cleveland, MD [facility]  . IR GENERIC HISTORICAL  02/09/2016   IR FLUORO  GUIDE CV LINE RIGHT 02/09/2016 Arne Cleveland, MD [facility]  . LUMBAR DISC SURGERY     per patient report  . PORTACATH PLACEMENT    . VIDEO ASSISTED THORACOSCOPY (VATS)/ LOBECTOMY Right 12/30/2015   Procedure: VIDEO ASSISTED THORACOSCOPY (VATS)/RIGHT MIDDLE LOBECTOMY;  Surgeon: Melrose Nakayama, MD;  Location: [facility];  Service: Thoracic;  Laterality: Right;     SOCIAL HISTORY:  Social History   Socioeconomic History  . Marital status: Married    Spouse name: Not on file  . Number of children: Not on file  . Years of education: Not on file  . Highest education level: Not on file  Occupational History  . Not on file  Social Needs  . Financial resource strain: Not on file  . Food insecurity:    Worry: Not on file    Inability: Not on  file  . Transportation needs:    Medical: Not on file    Non-medical: Not on file  Tobacco Use  . Smoking status: Former Smoker    Types: Cigarettes    Last attempt to quit: 05/27/2004    Years since quitting: 13.9  . Smokeless tobacco: Never Used  Substance and Sexual Activity  . Alcohol use: No    Alcohol/week: 0.0 standard drinks  . Drug use: No  . Sexual activity: Not on file    Comment: married  Lifestyle  . Physical activity:    Days per week: Not on file    Minutes per session: Not on file  . Stress: Not on file  Relationships  . Social connections:    Talks on phone: Not on file    Gets together: Not on file    Attends religious service: Not on file    Active member of club or organization: Not on file    Attends meetings of clubs or organizations: Not on file    Relationship status: Not on file  . Intimate partner violence:    Fear of current or ex partner: Not on file    Emotionally abused: Not on file    Physically abused: Not on file    Forced sexual activity: Not on file  Other Topics Concern  . Not on file  Social History Narrative  . Not on file    FAMILY HISTORY:  Family History  Problem Relation Age of  Onset  . Colon cancer Brother     CURRENT MEDICATIONS:  Outpatient Encounter Medications as of 05/01/2018  Medication Sig  . ACCU-CHEK AVIVA PLUS test strip USE 5 STRIPS DAILY TO MONITOR FLUCTUATING BLOOD SUGAR  . ACCU-CHEK FASTCLIX LANCETS MISC USE 6 LANCETS DAILY  . ALPRAZolam (XANAX) 0.5 MG tablet Take 0.5 mg by mouth daily.  Marland Kitchen aspirin EC 81 MG tablet Take 81 mg by mouth daily.  . calcium carbonate (OS-CAL - DOSED IN MG OF ELEMENTAL CALCIUM) 1250 (500 Ca) MG tablet Take 1 tablet (500 mg of elemental calcium total) by mouth daily with breakfast.  . Cholecalciferol (VITAMIN D) 2000 UNITS CAPS Take 1 capsule by mouth daily.   . Cyanocobalamin (VITAMIN B-12 IJ) Inject 1 Dose as directed every 30 (thirty) days.  Marland Kitchen glipiZIDE (GLUCOTROL) 5 MG tablet Take 1 tablet (5 mg total) by mouth 2 (two) times daily before a meal. (Patient taking differently: Take 2.5 mg by mouth 2 (two) times daily before a meal. )  . lidocaine-prilocaine (EMLA) cream Apply a quarter size amount to port site 1 hour prior to chemo. Do not rub in. Cover with plastic wrap.  . lisinopril (PRINIVIL,ZESTRIL) 40 MG tablet Take 1 tablet (40 mg total) by mouth daily.  Marland Kitchen loratadine (CLARITIN) 10 MG tablet Take 10 mg by mouth daily.  . meclizine (ANTIVERT) 25 MG tablet Take 25 mg by mouth 3 (three) times daily.   . ondansetron (ZOFRAN) 8 MG tablet Take 1 tablet (8 mg total) by mouth every 8 (eight) hours as needed for nausea or vomiting.  . pantoprazole (PROTONIX) 40 MG tablet Take 40 mg by mouth 2 (two) times daily.   . potassium chloride SA (K-DUR,KLOR-CON) 20 MEQ tablet Take 1 tablet (20 mEq total) by mouth 2 (two) times daily.  . prochlorperazine (COMPAZINE) 10 MG tablet TAKE ONE TABLET BY MOUTH EVERY 6 HOURS AS NEEDED FOR NAUSEA AND VOMITING  . simvastatin (ZOCOR) 40 MG tablet Take 40 mg by mouth at bedtime.   Marland Kitchen  terazosin (HYTRIN) 5 MG capsule Take 5 mg by mouth at bedtime.  Marland Kitchen tiZANidine (ZANAFLEX) 4 MG tablet Take 4 mg by mouth  at bedtime.  . [EXPIRED] cyanocobalamin ((VITAMIN B-12)) injection 1,000 mcg    No facility-administered encounter medications on file as of 05/01/2018.     ALLERGIES:  Allergies  Allergen Reactions  . Morphine And Related Other (See Comments)    confusion     PHYSICAL EXAM:  ECOG Performance status: 1  Vitals:   05/01/18 1043  BP: (!) 124/57  Pulse: 84  Resp: 18  Temp: (!) 97.5 F (36.4 C)  SpO2: 97%   Filed Weights   05/01/18 1043  Weight: 204 lb (92.5 kg)    Physical Exam  Constitutional: He is oriented to person, place, and time. He appears well-developed and well-nourished.  Cardiovascular: Normal rate, regular rhythm and normal heart sounds.  Pulmonary/Chest: Effort normal and breath sounds normal.  Musculoskeletal: Normal range of motion.  Neurological: He is alert and oriented to person, place, and time.  Skin: Skin is warm and dry.  Psychiatric: He has a normal mood and affect. His behavior is normal. Judgment and thought content normal.     LABORATORY DATA:  I have reviewed the labs as listed.  CBC    Component Value Date/Time   WBC 8.4 04/24/2018 1117   RBC 3.59 (L) 04/24/2018 1117   HGB 10.5 (L) 04/24/2018 1117   HCT 33.2 (L) 04/24/2018 1117   PLT 446 (H) 04/24/2018 1117   MCV 92.5 04/24/2018 1117   MCH 29.2 04/24/2018 1117   MCHC 31.6 04/24/2018 1117   RDW 12.9 04/24/2018 1117   LYMPHSABS 1.8 04/24/2018 1117   MONOABS 0.7 04/24/2018 1117   EOSABS 0.3 04/24/2018 1117   BASOSABS 0.1 04/24/2018 1117   CMP Latest Ref Rng & Units 04/24/2018 04/11/2018 11/25/2017  Glucose 70 - 99 mg/dL 92 194(H) 177(H)  BUN 8 - 23 mg/dL '10 12 14  ' Creatinine 0.61 - 1.24 mg/dL 1.16 1.25(H) 1.15  Sodium 135 - 145 mmol/L 135 133(L) 133(L)  Potassium 3.5 - 5.1 mmol/L 3.9 2.8(L) 3.9  Chloride 98 - 111 mmol/L 103 101 100(L)  CO2 22 - 32 mmol/L 25 20(L) 25  Calcium 8.9 - 10.3 mg/dL 8.6(L) 7.7(L) 8.7(L)  Total Protein 6.5 - 8.1 g/dL 7.0 6.3(L) 6.2(L)  Total Bilirubin  0.3 - 1.2 mg/dL 0.6 0.9 0.6  Alkaline Phos 38 - 126 U/L 73 60 68  AST 15 - 41 U/L 25 42(H) 27  ALT 0 - 44 U/L '27 23 23         ' ASSESSMENT & PLAN:   Adenocarcinoma of right lung, stage 3 (HCC) 1.  Stage IIIa right lung adenocarcinoma: - Diagnosed in June 2017, status post right middle tubectomy, VATS with mediastinal node sampling, PDL 1 negative - Status post carboplatin and pemetrexed for 6 cycles completed on 05/31/2016 -Radiation therapy completed 08/01/2016 -Consolidation Durvalumab from 09/04/2016 through 08/28/2017 -CT scan dated 12/18/2017 shows small right pleural effusion, mildly increased.  No evidence of recurrence or metastatic disease.  We will continue to monitor chest CT with contrast every 3 to 6 months for 3 years followed by every 6 months for subsequent 2 years and then low-dose noncontrast CT annually. - Due to his falls, we have done an MRI of the brain dated 12/18/2017.  It did not show any evidence of metastatic disease.  It did show chronic right cerebellar infarct which was small.  He was told to walk with  the help of walker all the time. -Denies any change in his baseline cough.  Denies any hemoptysis.  He missed his CT scan of the chest. -I will schedule him for his CT of the chest and see him back after that.  2.  Normocytic anemia: -His hemoglobin dropped by 1 g.  He has mild CKD. -We will check his ferritin, iron panel, D52 and folic acid levels.      Orders placed this encounter:  Orders Placed This Encounter  Procedures  . CT Chest W Contrast  . Ferritin  . Iron and TIBC  . Vitamin B12  . Folate      Derek Jack, MD [place] [phone]

## 2018-05-01 NOTE — Patient Instructions (Addendum)
[place] at [facility] Discharge Instructions  Follow up in 3 weeks with scans and labs    Thank you for choosing [place] at [facility] to provide your oncology and hematology care.  To afford each patient quality time with our provider, please arrive at least 15 minutes before your scheduled appointment time.   If you have a lab appointment with the [facility] please come in thru the  Main Entrance and check in at the main information desk  You need to re-schedule your appointment should you arrive 10 or more minutes late.  We strive to give you quality time with our providers, and arriving late affects you and other patients whose appointments are after yours.  Also, if you no show three or more times for appointments you may be dismissed from the clinic at the providers discretion.     Again, thank you for choosing [facility].  Our hope is that these requests will decrease the amount of time that you wait before being seen by our physicians.       _____________________________________________________________  Should you have questions after your visit to [facility], please contact our office at [phone] between the hours of 8:00 a.m. and 4:30 p.m.  Voicemails left after 4:00 p.m. will not be returned until the following business day.  For prescription refill requests, have your pharmacy contact our office and allow 72 hours.    Cancer Center Support Programs:   > Cancer Support Group  2nd Tuesday of the month 1pm-2pm, Journey Room

## 2018-05-20 ENCOUNTER — Ambulatory Visit (HOSPITAL_COMMUNITY)
Admission: RE | Admit: 2018-05-20 | Discharge: 2018-05-20 | Disposition: A | Discharge status: Home / Self Care | Payer: Medicare Other | Source: Ambulatory Visit | Attending: Nurse Practitioner | Admitting: Nurse Practitioner

## 2018-05-20 ENCOUNTER — Inpatient Hospital Stay (HOSPITAL_COMMUNITY): Payer: Medicare Other | Attending: Hematology

## 2018-05-20 DIAGNOSIS — D649 Anemia, unspecified: Secondary | ICD-10-CM | POA: Diagnosis present

## 2018-05-20 DIAGNOSIS — Z85118 Personal history of other malignant neoplasm of bronchus and lung: Secondary | ICD-10-CM | POA: Diagnosis not present

## 2018-05-20 DIAGNOSIS — E538 Deficiency of other specified B group vitamins: Secondary | ICD-10-CM

## 2018-05-20 DIAGNOSIS — C3491 Malignant neoplasm of unspecified part of right bronchus or lung: Secondary | ICD-10-CM | POA: Diagnosis present

## 2018-05-20 LAB — CBC WITH DIFFERENTIAL/PLATELET
Abs Immature Granulocytes: 0.04 10*3/uL (ref 0.00–0.07)
Basophils Absolute: 0.1 10*3/uL (ref 0.0–0.1)
Basophils Relative: 1 %
Eosinophils Absolute: 0.9 10*3/uL — ABNORMAL HIGH (ref 0.0–0.5)
Eosinophils Relative: 10 %
HCT: 37.8 % — ABNORMAL LOW (ref 39.0–52.0)
Hemoglobin: 12.2 g/dL — ABNORMAL LOW (ref 13.0–17.0)
Immature Granulocytes: 0 %
Lymphocytes Relative: 20 %
Lymphs Abs: 1.9 10*3/uL (ref 0.7–4.0)
MCH: 29.8 pg (ref 26.0–34.0)
MCHC: 32.3 g/dL (ref 30.0–36.0)
MCV: 92.4 fL (ref 80.0–100.0)
Monocytes Absolute: 0.7 10*3/uL (ref 0.1–1.0)
Monocytes Relative: 7 %
Neutro Abs: 6 10*3/uL (ref 1.7–7.7)
Neutrophils Relative %: 62 %
Platelets: 234 10*3/uL (ref 150–400)
RBC: 4.09 MIL/uL — ABNORMAL LOW (ref 4.22–5.81)
RDW: 13.3 % (ref 11.5–15.5)
WBC: 9.6 10*3/uL (ref 4.0–10.5)
nRBC: 0 % (ref 0.0–0.2)

## 2018-05-20 LAB — COMPREHENSIVE METABOLIC PANEL
ALT: 18 U/L (ref 0–44)
AST: 24 U/L (ref 15–41)
Albumin: 3.2 g/dL — ABNORMAL LOW (ref 3.5–5.0)
Alkaline Phosphatase: 63 U/L (ref 38–126)
Anion gap: 6 (ref 5–15)
BUN: 12 mg/dL (ref 8–23)
CO2: 24 mmol/L (ref 22–32)
Calcium: 8.6 mg/dL — ABNORMAL LOW (ref 8.9–10.3)
Chloride: 104 mmol/L (ref 98–111)
Creatinine, Ser: 1.2 mg/dL (ref 0.61–1.24)
GFR calc Af Amer: >60 mL/min (ref >60)
GFR calc non Af Amer: 55 mL/min — ABNORMAL LOW (ref >60)
Glucose, Bld: 244 mg/dL — ABNORMAL HIGH (ref 70–99)
Potassium: 3.8 mmol/L (ref 3.5–5.1)
Sodium: 134 mmol/L — ABNORMAL LOW (ref 135–145)
Total Bilirubin: 0.6 mg/dL (ref 0.3–1.2)
Total Protein: 6.5 g/dL (ref 6.5–8.1)

## 2018-05-20 LAB — FERRITIN: Ferritin: 22 ng/mL — ABNORMAL LOW (ref 24–336)

## 2018-05-20 LAB — IRON AND TIBC
Iron: 86 ug/dL (ref 45–182)
Saturation Ratios: 27 % (ref 17.9–39.5)
TIBC: 317 ug/dL (ref 250–450)
UIBC: 231 ug/dL

## 2018-05-20 LAB — VITAMIN B12: Vitamin B-12: 368 pg/mL (ref 180–914)

## 2018-05-20 LAB — FOLATE: Folate: 24.7 ng/mL (ref >5.9)

## 2018-05-20 LAB — TSH: TSH: 5.019 u[IU]/mL — ABNORMAL HIGH (ref 0.350–4.500)

## 2018-05-20 LAB — LACTATE DEHYDROGENASE: LDH: 125 U/L (ref 98–192)

## 2018-05-20 MED ORDER — IOHEXOL 300 MG/ML  SOLN
75.0000 mL | Freq: Once | INTRAMUSCULAR | Status: AC | PRN
  Administered 2018-05-20: 75 mL via INTRAVENOUS

## 2018-05-29 ENCOUNTER — Encounter (HOSPITAL_COMMUNITY): Payer: Self-pay | Admitting: Hematology

## 2018-05-29 ENCOUNTER — Inpatient Hospital Stay (HOSPITAL_BASED_OUTPATIENT_CLINIC_OR_DEPARTMENT_OTHER): Payer: Medicare Other | Admitting: Hematology

## 2018-05-29 ENCOUNTER — Inpatient Hospital Stay (HOSPITAL_COMMUNITY): Payer: Medicare Other

## 2018-05-29 DIAGNOSIS — Z85118 Personal history of other malignant neoplasm of bronchus and lung: Secondary | ICD-10-CM | POA: Diagnosis not present

## 2018-05-29 DIAGNOSIS — J90 Pleural effusion, not elsewhere classified: Secondary | ICD-10-CM

## 2018-05-29 DIAGNOSIS — D649 Anemia, unspecified: Secondary | ICD-10-CM | POA: Diagnosis not present

## 2018-05-29 DIAGNOSIS — I639 Cerebral infarction, unspecified: Secondary | ICD-10-CM

## 2018-05-29 DIAGNOSIS — E119 Type 2 diabetes mellitus without complications: Secondary | ICD-10-CM

## 2018-05-29 DIAGNOSIS — R5383 Other fatigue: Secondary | ICD-10-CM

## 2018-05-29 DIAGNOSIS — C3491 Malignant neoplasm of unspecified part of right bronchus or lung: Secondary | ICD-10-CM

## 2018-05-29 DIAGNOSIS — Z87891 Personal history of nicotine dependence: Secondary | ICD-10-CM

## 2018-05-29 MED ORDER — CYANOCOBALAMIN 1000 MCG/ML IJ SOLN: 1000.0000 ug | Freq: Once | INTRAMUSCULAR | Status: DC

## 2018-05-29 MED ORDER — SODIUM CHLORIDE 0.9% FLUSH: 10.0000 mL | INTRAVENOUS | Status: AC | PRN

## 2018-05-29 MED ORDER — HEPARIN SOD (PORK) LOCK FLUSH 100 UNIT/ML IV SOLN: 500.0000 [IU] | Freq: Once | INTRAVENOUS | Status: AC

## 2018-05-29 NOTE — Assessment & Plan Note (Addendum)
1.  Stage IIIa right lung adenocarcinoma: - Diagnosed in June 2017, status post right middle tubectomy, VATS with mediastinal node sampling, PDL 1 negative - Status post carboplatin and pemetrexed for 6 cycles completed on 05/31/2016 -Radiation therapy completed 08/01/2016 -Consolidation Durvalumab from 09/04/2016 through 08/28/2017 -CT scan dated 12/18/2017 shows small right pleural effusion, mildly increased.  No evidence of recurrence or metastatic disease.   - MRI of the brain on 12/18/2017 did not show any evidence of metastatic disease.  Showed chronic right cerebellar infarct which was small. -We discussed the results of the CT chest dated 05/20/2018 which showed stable perihilar radiation change in the right lung.  Stable small right pleural effusion with volume loss.  No evidence of lung cancer recurrence or metastasis. -We will continue to monitor chest CT with contrast every 3 to 6 months for 3 years followed by every 6 months for subsequent 2 years and then low-dose noncontrast CT annually.  2.  Normocytic anemia: -His hemoglobin on 05/20/2018 was 12.2.  Ferritin was low at 22.  X10 and folic acid were normal.  TSH was mildly elevated at 5.019.  We will closely monitor it. - He could not tolerate iron in the past.  We will give him Feraheme weekly x2.  We discussed the side effects in detail including rare chance of anaphylactic reactions. -I plan to repeat his ferritin and iron panel in 3 months.

## 2018-05-29 NOTE — Progress Notes (Signed)
[place], [place] [address]   CLINIC:  Medical Oncology/Hematology  PCP:  Monico Blitz, [place] [address] [phone]   REASON FOR VISIT: Follow-up for stage 3 adenocarcinoma of the right lung  CURRENT THERAPY: Surveillance per NCCN guidlines  BRIEF ONCOLOGIC HISTORY:    Adenocarcinoma of right lung, stage 3 ([place])   11/10/2015 Imaging    13 mm spiculated lesion seen in right middle lobe.    11/30/2015 PET scan    Spiculated right middle lobe nodule is mildly hypermetabolic and most consistent with a stage IA adenocarcinoma.    12/30/2015 Surgery    Right middle lobectomy and node dissection    12/30/2015 Procedure    Right video-assisted thoracoscopy, Thoracoscopic right middle lobectomy, Mediastinal lymph node dissection, and On-Q local anesthetic catheter placement.    01/02/2016 Pathology Results    1. Lung, resection (segmental or lobe), Right Middle Lobe - INVASIVE ADENOCARCINOMA, WELL DIFFERENTIATED, SPANNING 1.2 CM. - THE SURGICAL RESECTION MARGINS ARE NEGATIVE FOR CARCINOMA. 2. Lymph node, biopsy, Level 12 - METASTATIC CARCINOMA IN 1 OF 1 LYMPH NODE (1/1). 3. Lymph node, biopsy, Level 12 #2 - METASTATIC CARCINOMA IN 1 OF 1 LYMPH NODE (1/1). 4. Lymph node, biopsy, Level 7 - METASTATIC CARCINOMA IN 1 OF 1 LYMPH NODE (1/1/). 5. Lymph node, biopsy, Level 7 #2 - METASTATIC CARCINOMA IN 1 OF 1 LYMPH NODE (1/1). 6. Lymph node, biopsy, Level 7 #3 - METASTATIC CARCINOMA IN 1 OF 1 LYMPH NODE (1/1). 7. Lymph node, biopsy, Level 7 #4 - METASTATIC CARCINOMA IN 1 OF 1 LYMPH NODE (1/1). 8. Lymph node, biopsy, 4R - THERE IS NO EVIDENCE OF CARCINOMA IN 1 OF 1 LYMPH NODE (0/1). 9. Lymph node, biopsy, 4R #2 - THERE IS NO EVIDENCE OF CARCINOMA IN 1 OF 1 LYMPH NODE (0/1).    01/05/2016 Pathology Results    PDL1 NEGATIVE- tumor proportion score of 0%.     01/05/2016 Pathology Results    Genomic alterations identified: BRAF  V600E, KIT amplification, PDGFRA amplification, CDKN2A/B loss, TP53 S106f*33.  Additional findings: MSI-STABLE.  No reportable alterations identified: EGFR, KRAS, ALK, MET, RET, ERBB2, ROS1    02/09/2016 Procedure    Port placed by IR.    02/16/2016 - 05/31/2016 Chemotherapy    Carboplatin/Pemetrexed x 6 cycles      - 08/01/2016 Radiation Therapy    [place], [place]    08/28/2016 Imaging    CT CAP- No evidence of recurrent or metastatic carcinoma within the chest, abdomen, or pelvis.  Stable incidental findings include a absent, small benign right adrenal adenoma, sigmoid diverticulosis, and aortic atherosclerosis.    09/04/2016 -  Chemotherapy    Imfinzi immunotherapy up to 52 weeks.     12/07/2016 Imaging    CT C/A/P: New focal streaky opacities within the peripheral posterior right lower lobe and anteromedial left upper lobe- likely atelectasis with infection or other process is less likely. Recommend attention to these areas on future scans.  Small right pleural effusion, slightly increased from 08/28/2016. No evidence of pleural mass or enhancement.  No evidence of malignancy/ metastatic disease within the abdomen or pelvis.     02/12/2017 Imaging    CT chest: IMPRESSION: 1. Stable CT of the chest. No specific findings identified to suggest residual or recurrence of tumor. 2. Persistent right pleural effusion. 3. Paramediastinal radiation change predominantly involving the right lung is similar to previous study. 4. Stable right adrenal gland nodule and  low-density foci within the liver. 5. Aortic Atherosclerosis (ICD10-I70.0) and Emphysema (ICD10-J43.9). Multi vessel coronary artery calcifications noted.      CANCER STAGING: Cancer Staging Adenocarcinoma of right lung, stage 3 (HCC) Staging form: Lung, AJCC 7th Edition - Clinical stage from 12/09/2015: Stage IA (T1a, N0, M0) - Signed by Baird Cancer, PA-C on 12/09/2015 - Pathologic stage from 01/20/2016: Stage IIIA  (T1a, N2, cM0) - Signed by Baird Cancer, PA-C on 02/01/2016    INTERVAL HISTORY:  Mr. Mozley 84 y.o. male returns for routine follow-up for adenocarcinoma of the right lung. He is here today with his daughter. He is feeling fatigued throughout the day. He walks with a walker for stability. Denies any nausea, vomiting, or diarrhea. Denies any new pains. Had not noticed any recent bleeding such as epistaxis, hematuria or hematochezia. Denies recent chest pain on exertion, shortness of breath on minimal exertion, pre-syncopal episodes, or palpitations. Denies any numbness or tingling in hands or feet. Denies any recent fevers, infections, or recent hospitalizations. He reports his appetite at 100% and his energy level at 100%. He has no problems getting his medications.     REVIEW OF SYSTEMS:  Review of Systems  Constitutional: Positive for fatigue.  All other systems reviewed and are negative.    PAST MEDICAL/SURGICAL HISTORY:  Past Medical History:  Diagnosis Date  . Adenocarcinoma of lung, stage 3 (HCC)    Stage IIIA  . Adenocarcinoma of right lung, stage 3 ([place]) 12/02/2015  . Arthritis   . Atrial fibrillation, transient ([place])    transient postop, < 24 hours  . B12 deficiency   . Cyst of scrotum   . Diverticulitis   . GERD (gastroesophageal reflux disease)   . Hernia   . History of pneumonia   . Hyperlipidemia   . Lung nodule    Spiculated right middle lobe nodule, hypermetabolic  . Stroke ([place])   . Type 2 diabetes mellitus ([place])    Past Surgical History:  Procedure Laterality Date  . BACK SURGERY    . CHOLECYSTECTOMY    . COLONOSCOPY N/A 11/24/2015   Procedure: COLONOSCOPY;  Surgeon: Rogene Houston, MD;  Location: [facility];  Service: Endoscopy;  Laterality: N/A;  730  . COLONOSCOPY W/ POLYPECTOMY     x 5  . CYST EXCISION     Scrotum  . EYE SURGERY Bilateral    Cataract with Lens  . HERNIA REPAIR Right    Inguinal  . IR GENERIC HISTORICAL  02/09/2016   IR US  GUIDE VASC ACCESS RIGHT 02/09/2016 Arne Cleveland, MD [facility]-INTER[facility]  . IR GENERIC HISTORICAL  02/09/2016   IR FLUORO GUIDE CV LINE RIGHT 02/09/2016 Arne Cleveland, MD [facility]INTER[facility]  . LUMBAR DISC SURGERY     per patient report  . PORTACATH PLACEMENT    . VIDEO ASSISTED THORACOSCOPY (VATS)/ LOBECTOMY Right 12/30/2015   Procedure: VIDEO ASSISTED THORACOSCOPY (VATS)/RIGHT MIDDLE LOBECTOMY;  Surgeon: Melrose Nakayama, MD;  Location: [facility];  Service: Thoracic;  Laterality: Right;     SOCIAL HISTORY:  Social History   Socioeconomic History  . Marital status: Married    Spouse name: Not on file  . Number of children: Not on file  . Years of education: Not on file  . Highest education level: Not on file  Occupational History  . Not on file  Social Needs  . Financial resource strain: Not on file  . Food insecurity:    Worry: Not on file  Inability: Not on file  . Transportation needs:    Medical: Not on file    Non-medical: Not on file  Tobacco Use  . Smoking status: Former Smoker    Types: Cigarettes    Last attempt to quit: 05/27/2004    Years since quitting: 14.0  . Smokeless tobacco: Never Used  Substance and Sexual Activity  . Alcohol use: No    Alcohol/week: 0.0 standard drinks  . Drug use: No  . Sexual activity: Not on file    Comment: married  Lifestyle  . Physical activity:    Days per week: Not on file    Minutes per session: Not on file  . Stress: Not on file  Relationships  . Social connections:    Talks on phone: Not on file    Gets together: Not on file    Attends religious service: Not on file    Active member of club or organization: Not on file    Attends meetings of clubs or organizations: Not on file    Relationship status: Not on file  . Intimate partner violence:    Fear of current or ex partner: Not on file    Emotionally abused: Not on file    Physically abused: Not on file    Forced sexual activity: Not on file  Other Topics Concern  . Not  on file  Social History Narrative  . Not on file    FAMILY HISTORY:  Family History  Problem Relation Age of Onset  . Colon cancer Brother     CURRENT MEDICATIONS:  Outpatient Encounter Medications as of 05/29/2018  Medication Sig  . ACCU-CHEK AVIVA PLUS test strip USE 5 STRIPS DAILY TO MONITOR FLUCTUATING BLOOD SUGAR  . ACCU-CHEK FASTCLIX LANCETS MISC USE 6 LANCETS DAILY  . ALPRAZolam (XANAX) 0.5 MG tablet Take 0.5 mg by mouth daily.  Marland Kitchen aspirin EC 81 MG tablet Take 81 mg by mouth daily.  . calcium carbonate (OS-CAL - DOSED IN MG OF ELEMENTAL CALCIUM) 1250 (500 Ca) MG tablet Take 1 tablet (500 mg of elemental calcium total) by mouth daily with breakfast.  . Cholecalciferol (VITAMIN D) 2000 UNITS CAPS Take 1 capsule by mouth daily.   . Cyanocobalamin (VITAMIN B-12 IJ) Inject 1 Dose as directed every 30 (thirty) days.  Marland Kitchen glipiZIDE (GLUCOTROL) 5 MG tablet Take 1 tablet (5 mg total) by mouth 2 (two) times daily before a meal. (Patient taking differently: Take 2.5 mg by mouth 2 (two) times daily before a meal. )  . lidocaine-prilocaine (EMLA) cream Apply a quarter size amount to port site 1 hour prior to chemo. Do not rub in. Cover with plastic wrap.  . lisinopril (PRINIVIL,ZESTRIL) 40 MG tablet Take 1 tablet (40 mg total) by mouth daily.  Marland Kitchen loratadine (CLARITIN) 10 MG tablet Take 10 mg by mouth daily.  . meclizine (ANTIVERT) 25 MG tablet Take 25 mg by mouth 3 (three) times daily.   . ondansetron (ZOFRAN) 8 MG tablet Take 1 tablet (8 mg total) by mouth every 8 (eight) hours as needed for nausea or vomiting.  . pantoprazole (PROTONIX) 40 MG tablet Take 40 mg by mouth 2 (two) times daily.   . potassium chloride SA (K-DUR,KLOR-CON) 20 MEQ tablet Take 1 tablet (20 mEq total) by mouth 2 (two) times daily.  . prochlorperazine (COMPAZINE) 10 MG tablet TAKE ONE TABLET BY MOUTH EVERY 6 HOURS AS NEEDED FOR NAUSEA AND VOMITING  . simvastatin (ZOCOR) 40 MG tablet Take 40 mg by mouth  at bedtime.   Marland Kitchen  terazosin (HYTRIN) 5 MG capsule Take 5 mg by mouth at bedtime.  Marland Kitchen tiZANidine (ZANAFLEX) 4 MG tablet Take 4 mg by mouth at bedtime.   No facility-administered encounter medications on file as of 05/29/2018.     ALLERGIES:  Allergies  Allergen Reactions  . Morphine And Related Other (See Comments)    confusion     PHYSICAL EXAM:  ECOG Performance status: 1  VITAL SIGNS:BP: 128/49, P:83, R:18, T:97.4, O2:94% WEIGHT:209.8  Physical Exam Constitutional:      Appearance: Normal appearance. He is normal weight.  Musculoskeletal: Normal range of motion.  Skin:    General: Skin is warm and dry.  Neurological:     Mental Status: He is oriented to person, place, and time. Mental status is at baseline.  Psychiatric:        Mood and Affect: Mood normal.        Behavior: Behavior normal.        Thought Content: Thought content normal.        Judgment: Judgment normal.      LABORATORY DATA:  I have reviewed the labs as listed.  CBC    Component Value Date/Time   WBC 9.6 05/20/2018 1038   RBC 4.09 (L) 05/20/2018 1038   HGB 12.2 (L) 05/20/2018 1038   HCT 37.8 (L) 05/20/2018 1038   PLT 234 05/20/2018 1038   MCV 92.4 05/20/2018 1038   MCH 29.8 05/20/2018 1038   MCHC 32.3 05/20/2018 1038   RDW 13.3 05/20/2018 1038   LYMPHSABS 1.9 05/20/2018 1038   MONOABS 0.7 05/20/2018 1038   EOSABS 0.9 (H) 05/20/2018 1038   BASOSABS 0.1 05/20/2018 1038   CMP Latest Ref Rng & Units 05/20/2018 04/24/2018 04/11/2018  Glucose 70 - 99 mg/dL 244(H) 92 194(H)  BUN 8 - 23 mg/dL _0 Creatinine 0.61 - 1.24 mg/dL 1.20 1.16 1.25(H)  Sodium 135 - 145 mmol/L 134(L) 135 133(L)  Potassium 3.5 - 5.1 mmol/L 3.8 3.9 2.8(L)  Chloride 98 - 111 mmol/L 104 103 101  CO2 22 - 32 mmol/L 24 25 20(L)  Calcium 8.9 - 10.3 mg/dL 8.6(L) 8.6(L) 7.7(L)  Total Protein 6.5 - 8.1 g/dL 6.5 7.0 6.3(L)  Total Bilirubin 0.3 - 1.2 mg/dL 0.6 0.6 0.9  Alkaline Phos 38 - 126 U/L 63 73 60  AST 15 - 41 U/L 24 25 42(H)  ALT  0 - 44 U/L _1 DIAGNOSTIC IMAGING:  I have independently reviewed the scans and discussed with the patient.   I have reviewed Francene Finders, NP's note and agree with the documentation.  I personally performed a face-to-face visit, made revisions and my assessment and plan is as follows.    ASSESSMENT & PLAN:   Adenocarcinoma of right lung, stage 3 (HCC) 1.  Stage IIIa right lung adenocarcinoma: - Diagnosed in June 2017, status post right middle tubectomy, VATS with mediastinal node sampling, PDL 1 negative - Status post carboplatin and pemetrexed for 6 cycles completed on 05/31/2016 -Radiation therapy completed 08/01/2016 -Consolidation Durvalumab from 09/04/2016 through 08/28/2017 -CT scan dated 12/18/2017 shows small right pleural effusion, mildly increased.  No evidence of recurrence or metastatic disease.   - MRI of the brain on 12/18/2017 did not show any evidence of metastatic disease.  Showed chronic right cerebellar infarct which was small. -We discussed the results of the CT chest dated 05/20/2018 which showed stable perihilar radiation change in the right lung.  Stable small right pleural effusion with volume loss.  No evidence of lung cancer recurrence or metastasis. -We will continue to monitor chest CT with contrast every 3 to 6 months for 3 years followed by every 6 months for subsequent 2 years and then low-dose noncontrast CT annually.  2.  Normocytic anemia: -His hemoglobin on 05/20/2018 was 12.2.  Ferritin was low at 22.  W38 and folic acid were normal.  TSH was mildly elevated at 5.019.  We will closely monitor it. - He could not tolerate iron in the past.  We will give him Feraheme weekly x2.  We discussed the side effects in detail including rare chance of anaphylactic reactions. -I plan to repeat his ferritin and iron panel in 3 months.      Orders placed this encounter:  No orders of the defined types were placed in this encounter.     Derek Jack, MD [place] [phone]

## 2018-05-29 NOTE — Patient Instructions (Signed)
[place] at [facility] Discharge Instructions  Follow up in 3 months with labs prior.    Thank you for choosing [place] at [facility] to provide your oncology and hematology care.  To afford each patient quality time with our provider, please arrive at least 15 minutes before your scheduled appointment time.   If you have a lab appointment with the [place] please come in thru the  Main Entrance and check in at the main information desk  You need to re-schedule your appointment should you arrive 10 or more minutes late.  We strive to give you quality time with our providers, and arriving late affects you and other patients whose appointments are after yours.  Also, if you no show three or more times for appointments you may be dismissed from the clinic at the providers discretion.     Again, thank you for choosing [facility].  Our hope is that these requests will decrease the amount of time that you wait before being seen by our physicians.       _____________________________________________________________  Should you have questions after your visit to [facility], please contact our office at [phone] between the hours of 8:00 a.m. and 4:30 p.m.  Voicemails left after 4:00 p.m. will not be returned until the following business day.  For prescription refill requests, have your pharmacy contact our office and allow 72 hours.    Cancer Center Support Programs:   > Cancer Support Group  2nd Tuesday of the month 1pm-2pm, Journey Room

## 2018-05-30 ENCOUNTER — Inpatient Hospital Stay (HOSPITAL_COMMUNITY): Payer: Medicare Other

## 2018-05-30 ENCOUNTER — Encounter (HOSPITAL_COMMUNITY): Payer: Self-pay

## 2018-05-30 VITALS — BP 138/66 | HR 74 | Temp 98.0°F | Resp 18

## 2018-05-30 DIAGNOSIS — D649 Anemia, unspecified: Secondary | ICD-10-CM | POA: Diagnosis not present

## 2018-05-30 DIAGNOSIS — E538 Deficiency of other specified B group vitamins: Secondary | ICD-10-CM

## 2018-05-30 MED ORDER — CYANOCOBALAMIN 1000 MCG/ML IJ SOLN
1000.0000 ug | Freq: Once | INTRAMUSCULAR | Status: AC
  Administered 2018-05-30: 1000 ug via INTRAMUSCULAR
  Filled 2018-05-30: qty 1

## 2018-05-30 MED ORDER — HEPARIN SOD (PORK) LOCK FLUSH 100 UNIT/ML IV SOLN
INTRAVENOUS | Status: AC
  Filled 2018-05-30: qty 5

## 2018-05-30 MED ORDER — SODIUM CHLORIDE 0.9 % IV SOLN
510.0000 mg | Freq: Once | INTRAVENOUS | Status: AC
  Administered 2018-05-30: 510 mg via INTRAVENOUS
  Filled 2018-05-30: qty 17

## 2018-05-30 MED ORDER — SODIUM CHLORIDE 0.9 % IV SOLN
INTRAVENOUS | Status: DC
  Administered 2018-05-30: 14:00:00 via INTRAVENOUS

## 2018-05-30 NOTE — Patient Instructions (Signed)
[facility] at [facility]  Discharge Instructions:   _______________________________________________________________  Thank you for choosing [facility] at [facility] to provide your oncology and hematology care.  To afford each patient quality time with our providers, please arrive at least 15 minutes before your scheduled appointment.  You need to re-schedule your appointment if you arrive 10 or more minutes late.  We strive to give you quality time with our providers, and arriving late affects you and other patients whose appointments are after yours.  Also, if you no show three or more times for appointments you may be dismissed from the clinic.  Again, thank you for choosing [facility] at [facility]. Our hope is that these requests will allow you access to exceptional care and in a timely manner. _______________________________________________________________  If you have questions after your visit, please contact our office at [phone] between the hours of 8:30 a.m. and 5:00 p.m. Voicemails left after 4:30 p.m. will not be returned until the following business day. _______________________________________________________________  For prescription refill requests, have your pharmacy contact our office. _______________________________________________________________  Recommendations made by the consultant and any test results will be sent to your referring physician. _______________________________________________________________ 

## 2018-05-30 NOTE — Progress Notes (Signed)
Pt presents today for Feraheme infusion 1st dose. VSS. No complaints of any changes since the last visit. B12 given today in the L deltoid.   Feraheme given today per MD orders. Tolerated infusion without adverse affects. Vital signs stable. No complaints at this time. Discharged from clinic ambulatory. F/U with [facility] as scheduled.

## 2018-06-06 ENCOUNTER — Inpatient Hospital Stay (HOSPITAL_COMMUNITY): Payer: Medicare Other

## 2018-06-06 VITALS — BP 123/53 | HR 77 | Temp 98.6°F | Resp 16

## 2018-06-06 DIAGNOSIS — Z95828 Presence of other vascular implants and grafts: Secondary | ICD-10-CM

## 2018-06-06 DIAGNOSIS — D649 Anemia, unspecified: Secondary | ICD-10-CM | POA: Diagnosis not present

## 2018-06-06 DIAGNOSIS — E538 Deficiency of other specified B group vitamins: Secondary | ICD-10-CM

## 2018-06-06 MED ORDER — HEPARIN SOD (PORK) LOCK FLUSH 100 UNIT/ML IV SOLN
500.0000 [IU] | Freq: Once | INTRAVENOUS | Status: AC
  Administered 2018-06-06: 500 [IU] via INTRAVENOUS

## 2018-06-06 MED ORDER — SODIUM CHLORIDE 0.9% FLUSH
10.0000 mL | INTRAVENOUS | Status: DC | PRN
  Administered 2018-06-06: 10 mL via INTRAVENOUS
  Filled 2018-06-06: qty 10

## 2018-06-06 MED ORDER — SODIUM CHLORIDE 0.9 % IV SOLN
510.0000 mg | Freq: Once | INTRAVENOUS | Status: AC
  Administered 2018-06-06: 510 mg via INTRAVENOUS
  Filled 2018-06-06: qty 17

## 2018-06-06 MED ORDER — SODIUM CHLORIDE 0.9 % IV SOLN
INTRAVENOUS | Status: DC
  Administered 2018-06-06: 14:00:00 via INTRAVENOUS

## 2018-06-06 NOTE — Patient Instructions (Signed)
[name] at [facility]  Discharge Instructions:   _______________________________________________________________  Thank you for choosing [facility] at [facility] to provide your oncology and hematology care.  To afford each patient quality time with our providers, please arrive at least 15 minutes before your scheduled appointment.  You need to re-schedule your appointment if you arrive 10 or more minutes late.  We strive to give you quality time with our providers, and arriving late affects you and other patients whose appointments are after yours.  Also, if you no show three or more times for appointments you may be dismissed from the clinic.  Again, thank you for choosing [facility] at [facility]. Our hope is that these requests will allow you access to exceptional care and in a timely manner. _______________________________________________________________  If you have questions after your visit, please contact our office at [phone] between the hours of 8:30 a.m. and 5:00 p.m. Voicemails left after 4:30 p.m. will not be returned until the following business day. _______________________________________________________________  For prescription refill requests, have your pharmacy contact our office. _______________________________________________________________  Recommendations made by the consultant and any test results will be sent to your referring physician. _______________________________________________________________ 

## 2018-06-06 NOTE — Progress Notes (Signed)
Pt here today for Feraheme #2. VSS. No complaints of any changes since the last visit.    Feraheme given today per MD orders. Tolerated infusion without adverse affects. Vital signs stable. No complaints at this time. Discharged from clinic ambulatory. F/U with [name] as scheduled.

## 2018-06-30 ENCOUNTER — Inpatient Hospital Stay (HOSPITAL_COMMUNITY): Payer: Medicare Other | Attending: Hematology and Oncology

## 2018-06-30 VITALS — BP 118/53 | HR 88 | Temp 97.6°F | Resp 16

## 2018-06-30 DIAGNOSIS — E538 Deficiency of other specified B group vitamins: Secondary | ICD-10-CM

## 2018-06-30 MED ORDER — CYANOCOBALAMIN 1000 MCG/ML IJ SOLN
1000.0000 ug | Freq: Once | INTRAMUSCULAR | Status: AC
  Administered 2018-06-30: 1000 ug via INTRAMUSCULAR
  Filled 2018-06-30: qty 1

## 2018-06-30 NOTE — Progress Notes (Signed)
Pt here today for monthly B12 injection. Pt given injection in left deltoid. Pt tolerated injection well with no complaints. Pt stable and discharged home ambulatory with walker and wife. Pt to return in 1 month for next B12 injection.

## 2018-07-31 ENCOUNTER — Inpatient Hospital Stay (HOSPITAL_COMMUNITY): Payer: Medicare Other | Attending: Hematology and Oncology

## 2018-07-31 ENCOUNTER — Encounter (HOSPITAL_COMMUNITY): Payer: Self-pay

## 2018-07-31 VITALS — BP 116/49 | HR 92 | Temp 98.7°F | Resp 18

## 2018-07-31 DIAGNOSIS — E538 Deficiency of other specified B group vitamins: Secondary | ICD-10-CM | POA: Diagnosis not present

## 2018-07-31 MED ORDER — CYANOCOBALAMIN 1000 MCG/ML IJ SOLN
1000.0000 ug | Freq: Once | INTRAMUSCULAR | Status: AC
  Administered 2018-07-31: 1000 ug via INTRAMUSCULAR

## 2018-07-31 NOTE — Progress Notes (Signed)
[name] tolerated Vit B12 injection well without complaints or incident. VSS Pt discharged self ambulatory using his walker in satisfactory condition accompanied by family member

## 2018-07-31 NOTE — Patient Instructions (Signed)
[facility] at [facility] Discharge Instructions  Received Vit B12 injection today. Follow-up as scheduled. Call clinic for any questions or concerns   Thank you for choosing [facility] at [facility] to provide your oncology and hematology care.  To afford each patient quality time with our provider, please arrive at least 15 minutes before your scheduled appointment time.   If you have a lab appointment with the Cancer Center please come in thru the  Main Entrance and check in at the main information desk  You need to re-schedule your appointment should you arrive 10 or more minutes late.  We strive to give you quality time with our providers, and arriving late affects you and other patients whose appointments are after yours.  Also, if you no show three or more times for appointments you may be dismissed from the clinic at the providers discretion.     Again, thank you for choosing [facility].  Our hope is that these requests will decrease the amount of time that you wait before being seen by our physicians.       _____________________________________________________________  Should you have questions after your visit to [facility], please contact our office at [phone] between the hours of 8:00 a.m. and 4:30 p.m.  Voicemails left after 4:00 p.m. will not be returned until the following business day.  For prescription refill requests, have your pharmacy contact our office and allow 72 hours.    Cancer Center Support Programs:   > Cancer Support Group  2nd Tuesday of the month 1pm-2pm, Journey Room   

## 2018-08-20 ENCOUNTER — Other Ambulatory Visit (HOSPITAL_COMMUNITY): Payer: Self-pay

## 2018-08-20 DIAGNOSIS — C3491 Malignant neoplasm of unspecified part of right bronchus or lung: Secondary | ICD-10-CM

## 2018-08-21 ENCOUNTER — Other Ambulatory Visit: Payer: Self-pay

## 2018-08-21 ENCOUNTER — Inpatient Hospital Stay (HOSPITAL_COMMUNITY): Payer: Medicare Other | Attending: Hematology

## 2018-08-21 DIAGNOSIS — D509 Iron deficiency anemia, unspecified: Secondary | ICD-10-CM | POA: Diagnosis not present

## 2018-08-21 DIAGNOSIS — Z85118 Personal history of other malignant neoplasm of bronchus and lung: Secondary | ICD-10-CM | POA: Diagnosis present

## 2018-08-21 DIAGNOSIS — C3491 Malignant neoplasm of unspecified part of right bronchus or lung: Secondary | ICD-10-CM

## 2018-08-21 LAB — COMPREHENSIVE METABOLIC PANEL
ALT: 23 U/L (ref 0–44)
AST: 24 U/L (ref 15–41)
Albumin: 3.4 g/dL — ABNORMAL LOW (ref 3.5–5.0)
Alkaline Phosphatase: 71 U/L (ref 38–126)
Anion gap: 8 (ref 5–15)
BUN: 13 mg/dL (ref 8–23)
CO2: 26 mmol/L (ref 22–32)
Calcium: 9.1 mg/dL (ref 8.9–10.3)
Chloride: 101 mmol/L (ref 98–111)
Creatinine, Ser: 1.08 mg/dL (ref 0.61–1.24)
GFR calc Af Amer: >60 mL/min (ref >60)
GFR calc non Af Amer: >60 mL/min (ref >60)
Glucose, Bld: 304 mg/dL — ABNORMAL HIGH (ref 70–99)
Potassium: 4.2 mmol/L (ref 3.5–5.1)
Sodium: 135 mmol/L (ref 135–145)
Total Bilirubin: 0.5 mg/dL (ref 0.3–1.2)
Total Protein: 6.4 g/dL — ABNORMAL LOW (ref 6.5–8.1)

## 2018-08-21 LAB — CBC WITH DIFFERENTIAL/PLATELET
Abs Immature Granulocytes: 0.03 10*3/uL (ref 0.00–0.07)
Basophils Absolute: 0.1 10*3/uL (ref 0.0–0.1)
Basophils Relative: 1 %
Eosinophils Absolute: 0.9 10*3/uL — ABNORMAL HIGH (ref 0.0–0.5)
Eosinophils Relative: 11 %
HCT: 41.2 % (ref 39.0–52.0)
Hemoglobin: 13.4 g/dL (ref 13.0–17.0)
Immature Granulocytes: 0 %
Lymphocytes Relative: 22 %
Lymphs Abs: 1.7 10*3/uL (ref 0.7–4.0)
MCH: 29.8 pg (ref 26.0–34.0)
MCHC: 32.5 g/dL (ref 30.0–36.0)
MCV: 91.6 fL (ref 80.0–100.0)
Monocytes Absolute: 0.6 10*3/uL (ref 0.1–1.0)
Monocytes Relative: 8 %
Neutro Abs: 4.5 10*3/uL (ref 1.7–7.7)
Neutrophils Relative %: 58 %
Platelets: 225 10*3/uL (ref 150–400)
RBC: 4.5 MIL/uL (ref 4.22–5.81)
RDW: 12.9 % (ref 11.5–15.5)
WBC: 7.8 10*3/uL (ref 4.0–10.5)
nRBC: 0 % (ref 0.0–0.2)

## 2018-08-21 LAB — LACTATE DEHYDROGENASE: LDH: 133 U/L (ref 98–192)

## 2018-08-27 ENCOUNTER — Other Ambulatory Visit (HOSPITAL_COMMUNITY): Payer: Self-pay | Admitting: Nurse Practitioner

## 2018-08-27 DIAGNOSIS — E538 Deficiency of other specified B group vitamins: Secondary | ICD-10-CM

## 2018-08-27 DIAGNOSIS — C3491 Malignant neoplasm of unspecified part of right bronchus or lung: Secondary | ICD-10-CM

## 2018-08-28 ENCOUNTER — Inpatient Hospital Stay (HOSPITAL_COMMUNITY): Payer: Medicare Other

## 2018-08-28 ENCOUNTER — Other Ambulatory Visit: Payer: Self-pay

## 2018-08-28 ENCOUNTER — Inpatient Hospital Stay (HOSPITAL_BASED_OUTPATIENT_CLINIC_OR_DEPARTMENT_OTHER): Payer: Medicare Other | Admitting: Hematology

## 2018-08-28 ENCOUNTER — Encounter (HOSPITAL_COMMUNITY): Payer: Self-pay | Admitting: Hematology

## 2018-08-28 VITALS — BP 134/55 | HR 91 | Temp 97.6°F | Resp 18 | Wt 208.4 lb

## 2018-08-28 DIAGNOSIS — D509 Iron deficiency anemia, unspecified: Secondary | ICD-10-CM | POA: Diagnosis not present

## 2018-08-28 DIAGNOSIS — J90 Pleural effusion, not elsewhere classified: Secondary | ICD-10-CM | POA: Diagnosis not present

## 2018-08-28 DIAGNOSIS — Z87891 Personal history of nicotine dependence: Secondary | ICD-10-CM

## 2018-08-28 DIAGNOSIS — Z923 Personal history of irradiation: Secondary | ICD-10-CM

## 2018-08-28 DIAGNOSIS — C3491 Malignant neoplasm of unspecified part of right bronchus or lung: Secondary | ICD-10-CM

## 2018-08-28 DIAGNOSIS — Z902 Acquired absence of lung [part of]: Secondary | ICD-10-CM | POA: Diagnosis not present

## 2018-08-28 DIAGNOSIS — Z85118 Personal history of other malignant neoplasm of bronchus and lung: Secondary | ICD-10-CM | POA: Diagnosis not present

## 2018-08-28 DIAGNOSIS — Z9221 Personal history of antineoplastic chemotherapy: Secondary | ICD-10-CM

## 2018-08-28 DIAGNOSIS — E538 Deficiency of other specified B group vitamins: Secondary | ICD-10-CM

## 2018-08-28 DIAGNOSIS — Z800 Family history of malignant neoplasm of digestive organs: Secondary | ICD-10-CM

## 2018-08-28 DIAGNOSIS — I639 Cerebral infarction, unspecified: Secondary | ICD-10-CM

## 2018-08-28 LAB — IRON AND TIBC
Iron: 117 ug/dL (ref 45–182)
Saturation Ratios: 48 % — ABNORMAL HIGH (ref 17.9–39.5)
TIBC: 246 ug/dL — ABNORMAL LOW (ref 250–450)
UIBC: 129 ug/dL

## 2018-08-28 LAB — FERRITIN: Ferritin: 183 ng/mL (ref 24–336)

## 2018-08-28 LAB — VITAMIN B12: Vitamin B-12: 376 pg/mL (ref 180–914)

## 2018-08-28 LAB — FOLATE: Folate: 15.2 ng/mL (ref >5.9)

## 2018-08-28 MED ORDER — CYANOCOBALAMIN 1000 MCG/ML IJ SOLN
1000.0000 ug | Freq: Once | INTRAMUSCULAR | Status: AC
  Administered 2018-08-28: 1000 ug via INTRAMUSCULAR

## 2018-08-28 NOTE — Patient Instructions (Signed)
[facility] at [facility] _______________________________________________________________  Thank you for choosing [facility] at [facility] to provide your oncology and hematology care.  To afford each patient quality time with our providers, please arrive at least 15 minutes before your scheduled appointment.  You need to re-schedule your appointment if you arrive 10 or more minutes late.  We strive to give you quality time with our providers, and arriving late affects you and other patients whose appointments are after yours.  Also, if you no show three or more times for appointments you may be dismissed from the clinic.  Again, thank you for choosing [facility] at [facility]. Our hope is that these requests will allow you access to exceptional care and in a timely manner. _______________________________________________________________  If you have questions after your visit, please contact our office at [phone] between the hours of 8:30 a.m. and 5:00 p.m. Voicemails left after 4:30 p.m. will not be returned until the following business day. _______________________________________________________________  For prescription refill requests, have your pharmacy contact our office. _______________________________________________________________  Recommendations made by the consultant and any test results will be sent to your referring physician. _______________________________________________________________ 

## 2018-08-28 NOTE — Assessment & Plan Note (Signed)
1.  Stage IIIa right lung adenocarcinoma: - Diagnosed in June 2017, status post right middle tubectomy, VATS with mediastinal node sampling, PDL 1 negative - Status post carboplatin and pemetrexed for 6 cycles completed on 05/31/2016 -Radiation therapy completed 08/01/2016 -Consolidation Durvalumab from 09/04/2016 through 08/28/2017 -CT scan dated 12/18/2017 shows small right pleural effusion, mildly increased.  No evidence of recurrence or metastatic disease.   - MRI of the brain on 12/18/2017 did not show any evidence of metastatic disease.  Showed chronic right cerebellar infarct which was small. -CT chest dated 05/20/2018 showed stable perihilar radiation change in the right lung.  Stable small right pleural effusion with volume loss.  No evidence of lung cancer recurrence. -His breathing is stable to mildly improved.  Denies any change in cough or hemoptysis. -We will see him back in 3 months with repeat CT scan of the chest with contrast.   2.  Iron deficiency anemia: -He received Feraheme on 05/30/2018 and 06/06/2018. -We reviewed his blood work.  His hemoglobin improved to 13.2.  He is breathing better. -I will plan to repeat his labs in 3 months.  We will also follow-up on his TSH.

## 2018-08-28 NOTE — Progress Notes (Signed)
[name], [name] [id number]   CLINIC:  Medical Oncology/Hematology  PCP:  [name], [place] [address] [phone]   REASON FOR VISIT:  Follow-up for stage 3 adenocarcinoma of the right lung  CURRENT THERAPY:Surveillance per NCCN guidlines    BRIEF ONCOLOGIC HISTORY:    Adenocarcinoma of right lung, stage 3 ([place])   11/10/2015 Imaging    13 mm spiculated lesion seen in right Gary lobe.    11/30/2015 PET scan    Spiculated right Gary lobe nodule is mildly hypermetabolic and most consistent with a stage IA adenocarcinoma.    12/30/2015 Surgery    Right Gary lobectomy and node dissection    12/30/2015 Procedure    Right video-assisted thoracoscopy, Thoracoscopic right Gary lobectomy, Mediastinal lymph node dissection, and On-Q local anesthetic catheter placement.    01/02/2016 Pathology Results    1. Lung, resection (segmental or lobe), Right Gary Lobe - INVASIVE ADENOCARCINOMA, WELL DIFFERENTIATED, SPANNING 1.2 CM. - THE SURGICAL RESECTION MARGINS ARE NEGATIVE FOR CARCINOMA. 2. Lymph node, biopsy, Level 12 - METASTATIC CARCINOMA IN 1 OF 1 LYMPH NODE (1/1). 3. Lymph node, biopsy, Level 12 #2 - METASTATIC CARCINOMA IN 1 OF 1 LYMPH NODE (1/1). 4. Lymph node, biopsy, Level 7 - METASTATIC CARCINOMA IN 1 OF 1 LYMPH NODE (1/1/). 5. Lymph node, biopsy, Level 7 #2 - METASTATIC CARCINOMA IN 1 OF 1 LYMPH NODE (1/1). 6. Lymph node, biopsy, Level 7 #3 - METASTATIC CARCINOMA IN 1 OF 1 LYMPH NODE (1/1). 7. Lymph node, biopsy, Level 7 #4 - METASTATIC CARCINOMA IN 1 OF 1 LYMPH NODE (1/1). 8. Lymph node, biopsy, 4R - THERE IS NO EVIDENCE OF CARCINOMA IN 1 OF 1 LYMPH NODE (0/1). 9. Lymph node, biopsy, 4R #2 - THERE IS NO EVIDENCE OF CARCINOMA IN 1 OF 1 LYMPH NODE (0/1).    01/05/2016 Pathology Results    PDL1 NEGATIVE- tumor proportion score of 0%.     01/05/2016 Pathology Results    Genomic alterations identified:  BRAF V600E, KIT amplification, PDGFRA amplification, CDKN2A/B loss, TP53 S80f*33.  Additional findings: MSI-STABLE.  No reportable alterations identified: EGFR, KRAS, ALK, MET, RET, ERBB2, ROS1    02/09/2016 Procedure    Port placed by IR.    02/16/2016 - 05/31/2016 Chemotherapy    Carboplatin/Pemetrexed x 6 cycles      - 08/01/2016 Radiation Therapy    [place]    08/28/2016 Imaging    CT CAP- No evidence of recurrent or metastatic carcinoma within the chest, abdomen, or pelvis.  Stable incidental findings include a absent, small benign right adrenal adenoma, sigmoid diverticulosis, and aortic atherosclerosis.    09/04/2016 -  Chemotherapy    Imfinzi immunotherapy up to 52 weeks.     12/07/2016 Imaging    CT C/A/P: New focal streaky opacities within the peripheral posterior right lower lobe and anteromedial left upper lobe- likely atelectasis with infection or other process is less likely. Recommend attention to these areas on future scans.  Small right pleural effusion, slightly increased from 08/28/2016. No evidence of pleural mass or enhancement.  No evidence of malignancy/ metastatic disease within the abdomen or pelvis.     02/12/2017 Imaging    CT chest: IMPRESSION: 1. Stable CT of the chest. No specific findings identified to suggest residual or recurrence of tumor. 2. Persistent right pleural effusion. 3. Paramediastinal radiation change predominantly involving the right lung is similar to previous study. 4. Stable right adrenal gland  nodule and low-density foci within the liver. 5. Aortic Atherosclerosis (ICD10-I70.0) and Emphysema (ICD10-J43.9). Multi vessel coronary artery calcifications noted.      [name] STAGING: [name] Staging Adenocarcinoma of right lung, stage 3 (HCC) Staging form: Lung, AJCC 7th Edition - Clinical stage from 12/09/2015: Stage IA (T1a, N0, M0) - Signed by Gary Cancer, PA-C on 12/09/2015 - Pathologic stage from 01/20/2016: Stage  IIIA (T1a, N2, cM0) - Signed by Gary Cancer, PA-C on 02/01/2016    INTERVAL HISTORY:  [name] 85 y.o. male returns for routine follow-up. He is here today with his wife. He states that he has felt better since his iron infusion. He has some cough and takes tessalon pearls. Denies any nausea, vomiting, or diarrhea. Denies any new pains. Had not noticed any recent bleeding such as epistaxis, hematuria or hematochezia. Denies recent chest pain on exertion, shortness of breath on minimal exertion, pre-syncopal episodes, or palpitations. Denies any numbness or tingling in hands or feet. Denies any recent fevers, infections, or recent hospitalizations. Patient reports appetite at 100% and energy level at 100%.    REVIEW OF SYSTEMS:  Review of Systems  Respiratory: Positive for cough.      PAST MEDICAL/SURGICAL HISTORY:  Past Medical History:  Diagnosis Date  . Adenocarcinoma of lung, stage 3 (HCC)    Stage IIIA  . Adenocarcinoma of right lung, stage 3 ([place]) 12/02/2015  . Arthritis   . Atrial fibrillation, transient ([place])    transient postop, < 24 hours  . B12 deficiency   . Cyst of scrotum   . Diverticulitis   . GERD (gastroesophageal reflux disease)   . Hernia   . History of pneumonia   . Hyperlipidemia   . Lung nodule    Spiculated right Gary lobe nodule, hypermetabolic  . Stroke ([place])   . Type 2 diabetes mellitus ([place])    Past Surgical History:  Procedure Laterality Date  . BACK SURGERY    . CHOLECYSTECTOMY    . COLONOSCOPY N/A 11/24/2015   Procedure: COLONOSCOPY;  Surgeon: Gary Houston, MD;  Location: [facility];  Service: Endoscopy;  Laterality: N/A;  730  . COLONOSCOPY W/ POLYPECTOMY     x 5  . CYST EXCISION     Scrotum  . EYE SURGERY Bilateral    Cataract with Lens  . HERNIA REPAIR Right    Inguinal  . IR GENERIC HISTORICAL  02/09/2016   IR US GUIDE VASC ACCESS RIGHT 02/09/2016 Gary Cleveland, MD [facility]  . IR GENERIC HISTORICAL  02/09/2016   IR  FLUORO GUIDE CV LINE RIGHT 02/09/2016 Gary Cleveland, MD [facility]  . LUMBAR DISC SURGERY     per patient report  . PORTACATH PLACEMENT    . VIDEO ASSISTED THORACOSCOPY (VATS)/ LOBECTOMY Right 12/30/2015   Procedure: VIDEO ASSISTED THORACOSCOPY (VATS)/RIGHT Gary LOBECTOMY;  Surgeon: Gary Nakayama, MD;  Location: [facility];  Service: Thoracic;  Laterality: Right;     SOCIAL HISTORY:  Social History   Socioeconomic History  . Marital status: Married    Spouse name: Not on file  . Number of children: Not on file  . Years of education: Not on file  . Highest education level: Not on file  Occupational History  . Not on file  Social Needs  . Financial resource strain: Not on file  . Food insecurity:    Worry: Not on file    Inability: Not on file  . Transportation needs:    Medical: Not on  file    Non-medical: Not on file  Tobacco Use  . Smoking status: Former Smoker    Types: Cigarettes    Last attempt to quit: 05/27/2004    Years since quitting: 14.2  . Smokeless tobacco: Never Used  Substance and Sexual Activity  . Alcohol use: No    Alcohol/week: 0.0 standard drinks  . Drug use: No  . Sexual activity: Not on file    Comment: married  Lifestyle  . Physical activity:    Days per week: Not on file    Minutes per session: Not on file  . Stress: Not on file  Relationships  . Social connections:    Talks on phone: Not on file    Gets together: Not on file    Attends religious service: Not on file    Active member of club or organization: Not on file    Attends meetings of clubs or organizations: Not on file    Relationship status: Not on file  . Intimate partner violence:    Fear of current or ex partner: Not on file    Emotionally abused: Not on file    Physically abused: Not on file    Forced sexual activity: Not on file  Other Topics Concern  . Not on file  Social History Narrative  . Not on file    FAMILY HISTORY:  Family History  Problem Relation  Age of Onset  . Colon [name] Brother     CURRENT MEDICATIONS:  Outpatient Encounter Medications as of 08/28/2018  Medication Sig  . ACCU-CHEK AVIVA PLUS test strip USE 5 STRIPS DAILY TO MONITOR FLUCTUATING BLOOD SUGAR  . ACCU-CHEK FASTCLIX LANCETS MISC USE 6 LANCETS DAILY  . ALPRAZolam (XANAX) 0.5 MG tablet Take 0.5 mg by mouth daily.  Marland Kitchen aspirin EC 81 MG tablet Take 81 mg by mouth daily.  . calcium carbonate (OS-CAL - DOSED IN MG OF ELEMENTAL CALCIUM) 1250 (500 Ca) MG tablet Take 1 tablet (500 mg of elemental calcium total) by mouth daily with breakfast.  . Cholecalciferol (VITAMIN D) 2000 UNITS CAPS Take 1 capsule by mouth daily.   . Cyanocobalamin (VITAMIN B-12 IJ) Inject 1 Dose as directed every 30 (thirty) days.  Marland Kitchen glipiZIDE (GLUCOTROL) 5 MG tablet Take 1 tablet (5 mg total) by mouth 2 (two) times daily before a meal. (Patient taking differently: Take 2.5 mg by mouth 2 (two) times daily before a meal. )  . lidocaine-prilocaine (EMLA) cream Apply a quarter size amount to port site 1 hour prior to chemo. Do not rub in. Cover with plastic wrap.  . lisinopril (PRINIVIL,ZESTRIL) 40 MG tablet Take 1 tablet (40 mg total) by mouth daily.  Marland Kitchen loratadine (CLARITIN) 10 MG tablet Take 10 mg by mouth daily.  . meclizine (ANTIVERT) 25 MG tablet Take 25 mg by mouth 3 (three) times daily.   . ondansetron (ZOFRAN) 8 MG tablet Take 1 tablet (8 mg total) by mouth every 8 (eight) hours as needed for nausea or vomiting.  . pantoprazole (PROTONIX) 40 MG tablet Take 40 mg by mouth 2 (two) times daily.   . prochlorperazine (COMPAZINE) 10 MG tablet TAKE ONE TABLET BY MOUTH EVERY 6 HOURS AS NEEDED FOR NAUSEA AND VOMITING  . simvastatin (ZOCOR) 40 MG tablet Take 40 mg by mouth at bedtime.   Marland Kitchen terazosin (HYTRIN) 5 MG capsule Take 5 mg by mouth at bedtime.  Marland Kitchen tiZANidine (ZANAFLEX) 4 MG tablet Take 4 mg by mouth at bedtime.  . potassium chloride  SA (K-DUR,KLOR-CON) 20 MEQ tablet Take 1 tablet (20 mEq total) by mouth 2  (two) times daily. (Patient not taking: Reported on 08/28/2018)   Facility-Administered Encounter Medications as of 08/28/2018  Medication  . cyanocobalamin ((VITAMIN B-12)) injection 1,000 mcg  . heparin lock flush 100 unit/mL  . sodium chloride flush (NS) 0.9 % injection 10 mL    ALLERGIES:  Allergies  Allergen Reactions  . Morphine And Related Other (See Comments)    confusion     PHYSICAL EXAM:  ECOG Performance status: 2  Vitals:   08/28/18 1305  BP: (!) 134/55  Pulse: 91  Resp: 18  Temp: 97.6 F (36.4 C)  SpO2: 97%   Filed Weights   08/28/18 1305  Weight: 208 lb 6.4 oz (94.5 kg)    Physical Exam Constitutional:      Appearance: Normal appearance.  Cardiovascular:     Rate and Rhythm: Normal rate and regular rhythm.  Pulmonary:     Effort: Pulmonary effort is normal.     Breath sounds: Normal breath sounds.  Abdominal:     General: Bowel sounds are normal.     Palpations: Abdomen is soft.  Musculoskeletal:        General: No swelling.  Skin:    General: Skin is warm.  Neurological:     General: No focal deficit present.     Mental Status: He is alert and oriented to person, place, and time.  Psychiatric:        Mood and Affect: Mood normal.        Behavior: Behavior normal.      LABORATORY DATA:  I have reviewed the labs as listed.  CBC    Component Value Date/Time   WBC 7.8 08/21/2018 1135   RBC 4.50 08/21/2018 1135   HGB 13.4 08/21/2018 1135   HCT 41.2 08/21/2018 1135   PLT 225 08/21/2018 1135   MCV 91.6 08/21/2018 1135   MCH 29.8 08/21/2018 1135   MCHC 32.5 08/21/2018 1135   RDW 12.9 08/21/2018 1135   LYMPHSABS 1.7 08/21/2018 1135   MONOABS 0.6 08/21/2018 1135   EOSABS 0.9 (H) 08/21/2018 1135   BASOSABS 0.1 08/21/2018 1135   CMP Latest Ref Rng & Units 08/21/2018 05/20/2018 04/24/2018  Glucose 70 - 99 mg/dL 304(H) 244(H) 92  BUN 8 - 23 mg/dL '13 12 10  ' Creatinine 0.61 - 1.24 mg/dL 1.08 1.20 1.16  Sodium 135 - 145 mmol/L 135 134(L)  135  Potassium 3.5 - 5.1 mmol/L 4.2 3.8 3.9  Chloride 98 - 111 mmol/L 101 104 103  CO2 22 - 32 mmol/L '26 24 25  ' Calcium 8.9 - 10.3 mg/dL 9.1 8.6(L) 8.6(L)  Total Protein 6.5 - 8.1 g/dL 6.4(L) 6.5 7.0  Total Bilirubin 0.3 - 1.2 mg/dL 0.5 0.6 0.6  Alkaline Phos 38 - 126 U/L 71 63 73  AST 15 - 41 U/L '24 24 25  ' ALT 0 - 44 U/L '23 18 27       ' DIAGNOSTIC IMAGING:  I have independently reviewed the scans and discussed with the patient.   I have reviewed Venita Lick LPN's note and agree with the documentation.  I personally performed a face-to-face visit, made revisions and my assessment and plan is as follows.    ASSESSMENT & PLAN:   Adenocarcinoma of right lung, stage 3 (HCC) 1.  Stage IIIa right lung adenocarcinoma: - Diagnosed in June 2017, status post right Gary tubectomy, VATS with mediastinal node sampling, PDL 1 negative - Status post  carboplatin and pemetrexed for 6 cycles completed on 05/31/2016 -Radiation therapy completed 08/01/2016 -Consolidation Durvalumab from 09/04/2016 through 08/28/2017 -CT scan dated 12/18/2017 shows small right pleural effusion, mildly increased.  No evidence of recurrence or metastatic disease.   - MRI of the brain on 12/18/2017 did not show any evidence of metastatic disease.  Showed chronic right cerebellar infarct which was small. -CT chest dated 05/20/2018 showed stable perihilar radiation change in the right lung.  Stable small right pleural effusion with volume loss.  No evidence of lung [name] recurrence. -His breathing is stable to mildly improved.  Denies any change in cough or hemoptysis. -We will see him back in 3 months with repeat CT scan of the chest with contrast.   2.  Iron deficiency anemia: -He received Feraheme on 05/30/2018 and 06/06/2018. -We reviewed his blood work.  His hemoglobin improved to 13.2.  He is breathing better. -I will plan to repeat his labs in 3 months.  We will also follow-up on his TSH.       Orders  placed this encounter:  Orders Placed This Encounter  Procedures  . CT Chest W Contrast  . CBC with Differential/Platelet  . Comprehensive metabolic panel  . TSH  . Ferritin  . Iron and TIBC      Derek Jack, MD [place] [phone]

## 2018-08-28 NOTE — Patient Instructions (Addendum)
[place] at [facility] Discharge Instructions  You were seen today by Dr. Delton Coombes. He went over your recent lab results, your labs look better. He will see you back in 3 months for labs and follow up.   Thank you for choosing [place] at [facility] to provide your oncology and hematology care.  To afford each patient quality time with our provider, please arrive at least 15 minutes before your scheduled appointment time.   If you have a lab appointment with the [place] please come in thru the  Main Entrance and check in at the main information desk  You need to re-schedule your appointment should you arrive 10 or more minutes late.  We strive to give you quality time with our providers, and arriving late affects you and other patients whose appointments are after yours.  Also, if you no show three or more times for appointments you may be dismissed from the clinic at the providers discretion.     Again, thank you for choosing [facility].  Our hope is that these requests will decrease the amount of time that you wait before being seen by our physicians.       _____________________________________________________________  Should you have questions after your visit to [facility], please contact our office at [phone] between the hours of 8:00 a.m. and 4:30 p.m.  Voicemails left after 4:00 p.m. will not be returned until the following business day.  For prescription refill requests, have your pharmacy contact our office and allow 72 hours.    Cancer Center Support Programs:   > Cancer Support Group  2nd Tuesday of the month 1pm-2pm, Journey Room

## 2018-09-26 ENCOUNTER — Ambulatory Visit (HOSPITAL_COMMUNITY): Payer: Medicare Other

## 2018-10-24 ENCOUNTER — Other Ambulatory Visit: Payer: Self-pay

## 2018-10-24 ENCOUNTER — Inpatient Hospital Stay (HOSPITAL_COMMUNITY): Payer: Medicare Other | Attending: Hematology

## 2018-10-24 ENCOUNTER — Encounter (HOSPITAL_COMMUNITY): Payer: Self-pay

## 2018-10-24 VITALS — BP 140/56 | HR 79 | Temp 97.6°F | Resp 18

## 2018-10-24 DIAGNOSIS — E538 Deficiency of other specified B group vitamins: Secondary | ICD-10-CM | POA: Diagnosis present

## 2018-10-24 MED ORDER — CYANOCOBALAMIN 1000 MCG/ML IJ SOLN
1000.0000 ug | Freq: Once | INTRAMUSCULAR | Status: AC
  Administered 2018-10-24: 1000 ug via INTRAMUSCULAR
  Filled 2018-10-24: qty 1

## 2018-10-24 NOTE — Patient Instructions (Signed)
[facility] at [facility]  Discharge Instructions:   _______________________________________________________________  Thank you for choosing [place] at [facility] to provide your oncology and hematology care.  To afford each patient quality time with our providers, please arrive at least 15 minutes before your scheduled appointment.  You need to re-schedule your appointment if you arrive 10 or more minutes late.  We strive to give you quality time with our providers, and arriving late affects you and other patients whose appointments are after yours.  Also, if you no show three or more times for appointments you may be dismissed from the clinic.  Again, thank you for choosing [facility] at [facility]. Our hope is that these requests will allow you access to exceptional care and in a timely manner. _______________________________________________________________  If you have questions after your visit, please contact our office at [phone] between the hours of 8:30 a.m. and 5:00 p.m. Voicemails left after 4:30 p.m. will not be returned until the following business day. _______________________________________________________________  For prescription refill requests, have your pharmacy contact our office. _______________________________________________________________  Recommendations made by the consultant and any test results will be sent to your referring physician. _______________________________________________________________ 

## 2018-10-24 NOTE — Progress Notes (Signed)
Patient tolerated injection with no complaints voiced.  Site clean and dry with no bruising or swelling noted at site.  Band aid applied.  Vss with discharge and left ambulatory with no s/s of distress noted.  

## 2018-11-21 ENCOUNTER — Inpatient Hospital Stay (HOSPITAL_COMMUNITY): Payer: Medicare Other | Attending: Hematology

## 2018-11-21 ENCOUNTER — Ambulatory Visit (HOSPITAL_COMMUNITY): Payer: Medicare Other

## 2018-11-21 ENCOUNTER — Other Ambulatory Visit: Payer: Self-pay

## 2018-11-21 ENCOUNTER — Ambulatory Visit (HOSPITAL_COMMUNITY)
Admission: RE | Admit: 2018-11-21 | Discharge: 2018-11-21 | Disposition: A | Discharge status: Home / Self Care | Payer: Medicare Other | Source: Ambulatory Visit | Attending: Hematology | Admitting: Hematology

## 2018-11-21 ENCOUNTER — Other Ambulatory Visit (HOSPITAL_COMMUNITY): Payer: Medicare Other

## 2018-11-21 DIAGNOSIS — C3491 Malignant neoplasm of unspecified part of right bronchus or lung: Secondary | ICD-10-CM

## 2018-11-21 DIAGNOSIS — Z87891 Personal history of nicotine dependence: Secondary | ICD-10-CM | POA: Insufficient documentation

## 2018-11-21 DIAGNOSIS — D509 Iron deficiency anemia, unspecified: Secondary | ICD-10-CM | POA: Diagnosis not present

## 2018-11-21 DIAGNOSIS — E119 Type 2 diabetes mellitus without complications: Secondary | ICD-10-CM | POA: Insufficient documentation

## 2018-11-21 DIAGNOSIS — G479 Sleep disorder, unspecified: Secondary | ICD-10-CM | POA: Insufficient documentation

## 2018-11-21 DIAGNOSIS — E538 Deficiency of other specified B group vitamins: Secondary | ICD-10-CM | POA: Diagnosis not present

## 2018-11-21 DIAGNOSIS — Z85118 Personal history of other malignant neoplasm of bronchus and lung: Secondary | ICD-10-CM | POA: Diagnosis not present

## 2018-11-21 LAB — CBC WITH DIFFERENTIAL/PLATELET
Abs Immature Granulocytes: 0.03 10*3/uL (ref 0.00–0.07)
Basophils Absolute: 0.1 10*3/uL (ref 0.0–0.1)
Basophils Relative: 1 %
Eosinophils Absolute: 0.5 10*3/uL (ref 0.0–0.5)
Eosinophils Relative: 6 %
HCT: 39.7 % (ref 39.0–52.0)
Hemoglobin: 13.2 g/dL (ref 13.0–17.0)
Immature Granulocytes: 0 %
Lymphocytes Relative: 22 %
Lymphs Abs: 2.1 10*3/uL (ref 0.7–4.0)
MCH: 30.8 pg (ref 26.0–34.0)
MCHC: 33.2 g/dL (ref 30.0–36.0)
MCV: 92.8 fL (ref 80.0–100.0)
Monocytes Absolute: 0.9 10*3/uL (ref 0.1–1.0)
Monocytes Relative: 10 %
Neutro Abs: 5.7 10*3/uL (ref 1.7–7.7)
Neutrophils Relative %: 61 %
Platelets: 244 10*3/uL (ref 150–400)
RBC: 4.28 MIL/uL (ref 4.22–5.81)
RDW: 12.8 % (ref 11.5–15.5)
WBC: 9.3 10*3/uL (ref 4.0–10.5)
nRBC: 0 % (ref 0.0–0.2)

## 2018-11-21 LAB — COMPREHENSIVE METABOLIC PANEL
ALT: 20 U/L (ref 0–44)
AST: 22 U/L (ref 15–41)
Albumin: 3.6 g/dL (ref 3.5–5.0)
Alkaline Phosphatase: 67 U/L (ref 38–126)
Anion gap: 8 (ref 5–15)
BUN: 11 mg/dL (ref 8–23)
CO2: 28 mmol/L (ref 22–32)
Calcium: 9.1 mg/dL (ref 8.9–10.3)
Chloride: 104 mmol/L (ref 98–111)
Creatinine, Ser: 1.13 mg/dL (ref 0.61–1.24)
GFR calc Af Amer: >60 mL/min (ref >60)
GFR calc non Af Amer: 59 mL/min — ABNORMAL LOW (ref >60)
Glucose, Bld: 99 mg/dL (ref 70–99)
Potassium: 4.2 mmol/L (ref 3.5–5.1)
Sodium: 140 mmol/L (ref 135–145)
Total Bilirubin: 0.6 mg/dL (ref 0.3–1.2)
Total Protein: 6.6 g/dL (ref 6.5–8.1)

## 2018-11-21 LAB — IRON AND TIBC
Iron: 70 ug/dL (ref 45–182)
Saturation Ratios: 25 % (ref 17.9–39.5)
TIBC: 275 ug/dL (ref 250–450)
UIBC: 205 ug/dL

## 2018-11-21 LAB — TSH: TSH: 6.313 u[IU]/mL — ABNORMAL HIGH (ref 0.350–4.500)

## 2018-11-21 LAB — FERRITIN: Ferritin: 160 ng/mL (ref 24–336)

## 2018-11-21 MED ORDER — IOHEXOL 300 MG/ML  SOLN
75.0000 mL | Freq: Once | INTRAMUSCULAR | Status: AC | PRN
  Administered 2018-11-21: 75 mL via INTRAVENOUS

## 2018-11-28 ENCOUNTER — Other Ambulatory Visit: Payer: Self-pay

## 2018-11-28 ENCOUNTER — Encounter (HOSPITAL_COMMUNITY): Payer: Self-pay | Admitting: Nurse Practitioner

## 2018-11-28 ENCOUNTER — Inpatient Hospital Stay (HOSPITAL_COMMUNITY): Payer: Medicare Other

## 2018-11-28 ENCOUNTER — Ambulatory Visit (HOSPITAL_COMMUNITY): Payer: Medicare Other

## 2018-11-28 ENCOUNTER — Inpatient Hospital Stay (HOSPITAL_BASED_OUTPATIENT_CLINIC_OR_DEPARTMENT_OTHER): Payer: Medicare Other | Admitting: Nurse Practitioner

## 2018-11-28 ENCOUNTER — Ambulatory Visit (HOSPITAL_COMMUNITY): Payer: Medicare Other | Admitting: Nurse Practitioner

## 2018-11-28 VITALS — BP 119/42 | HR 88 | Temp 97.8°F | Resp 16 | Wt 212.0 lb

## 2018-11-28 DIAGNOSIS — Z85118 Personal history of other malignant neoplasm of bronchus and lung: Secondary | ICD-10-CM | POA: Diagnosis not present

## 2018-11-28 DIAGNOSIS — E119 Type 2 diabetes mellitus without complications: Secondary | ICD-10-CM

## 2018-11-28 DIAGNOSIS — D509 Iron deficiency anemia, unspecified: Secondary | ICD-10-CM | POA: Diagnosis not present

## 2018-11-28 DIAGNOSIS — G479 Sleep disorder, unspecified: Secondary | ICD-10-CM | POA: Diagnosis not present

## 2018-11-28 DIAGNOSIS — E538 Deficiency of other specified B group vitamins: Secondary | ICD-10-CM

## 2018-11-28 DIAGNOSIS — Z87891 Personal history of nicotine dependence: Secondary | ICD-10-CM

## 2018-11-28 DIAGNOSIS — C3491 Malignant neoplasm of unspecified part of right bronchus or lung: Secondary | ICD-10-CM

## 2018-11-28 MED ORDER — HEPARIN SOD (PORK) LOCK FLUSH 100 UNIT/ML IV SOLN
500.0000 [IU] | Freq: Once | INTRAVENOUS | Status: AC
  Administered 2018-11-28: 500 [IU] via INTRAVENOUS

## 2018-11-28 MED ORDER — CYANOCOBALAMIN 1000 MCG/ML IJ SOLN
INTRAMUSCULAR | Status: AC
  Filled 2018-11-28: qty 1

## 2018-11-28 MED ORDER — SODIUM CHLORIDE 0.9% FLUSH
10.0000 mL | Freq: Once | INTRAVENOUS | Status: AC
  Administered 2018-11-28: 10 mL via INTRAVENOUS

## 2018-11-28 MED ORDER — CYANOCOBALAMIN 1000 MCG/ML IJ SOLN
1000.0000 ug | Freq: Once | INTRAMUSCULAR | Status: AC
  Administered 2018-11-28: 1000 ug via INTRAMUSCULAR

## 2018-11-28 MED ORDER — ZOLPIDEM TARTRATE 10 MG PO TABS: 5.0000 mg | ORAL_TABLET | Freq: Every evening | ORAL | 1 refills | Status: DC | PRN

## 2018-11-28 NOTE — Progress Notes (Signed)
Patient tolerated injection with no complaints voiced.  Site clean and dry with no bruising or swelling noted at site.  Band aid applied.   Patients port flushed without difficulty.  Good blood return noted before and after flush. No complaints of pain with flush and no bruising or swelling noted at site.  Band aid applied.  VSS with discharge and left by wheelchair with no s/s of distress noted.

## 2018-11-28 NOTE — Assessment & Plan Note (Addendum)
1.  Stage IIIa right lung adenocarcinoma: - Diagnosed in June 2017, status post right below lobectomy, VATS with mediastinal node sampling, PDL 1 negative. - Status post carboplatin and pemetrexed for 6 cycles completed on 05/31/2016. -Radiation therapy completed 08/01/2016. - Consolidation Durvalumab from 09/04/2016 through 08/28/2017. -CT scan on 12/18/2017 showed small right pleural effusions, mildly increased, no evidence of recurrent or metastatic disease. - MRI of the brain on 12/18/2017 did not show any evidence of metastatic disease.  Showed chronic right cerebellar infarct which was small. -CT chest dated on 05/20/2018 showed stable perihilar radiation changes in the right lung.  Stable small right pleural effusions with volume loss.  No evidence of lung cancer recurrence. -His breathing is stable to mildly improved.  Denies any changes in cough or hemoptysis. -Recent CT of the chest dated 11/21/2018 no findings to suggest recurrent tumor or metastatic disease.  5 mm left lower lobe lung nodule is stable from previous study.  Unchanged small right pleural effusion. -We will see him back in 3 months with repeat blood work.  2.  Iron deficiency anemia: - Last received Feraheme on 05/30/2018 and 06/06/2018. - Labs on labs on 11/21/2018 showed hemoglobin 13.2, platelets 244, WBC 9.3, ferritin 160, percent saturation 25, creatinine 1.13. -His TSH was repeated and was increased to 6.313 from 5.019.  Patient did not want to start on Synthroid at this time wishes to recheck it in 4 months. -We will repeat labs in 3 months with repeat labs.  3.  Dizziness: - He reports he has been dizzy for 3 months with occasional falls. - [facility] which he says [facility] slightly. - We will follow-up with him in 3 months if he is still having issues we will refer him to ENT and a CT of the head

## 2018-11-28 NOTE — Progress Notes (Signed)
[place], [place] [address]   CLINIC:  Medical Oncology/Hematology  PCP:  Monico Blitz, MD [place] [address] [phone]   REASON FOR VISIT: Follow-up for adenocarcinoma of the right lung and iron deficiency anemia  CURRENT THERAPY: Surveillance per NCCN guidelines and intermittent iron infusions  BRIEF ONCOLOGIC HISTORY:  Oncology History  Adenocarcinoma of right lung, stage 3 ([facility])  11/10/2015 Imaging   13 mm spiculated lesion seen in right middle lobe.   11/30/2015 PET scan   Spiculated right middle lobe nodule is mildly hypermetabolic and most consistent with a stage IA adenocarcinoma.   12/30/2015 Surgery   Right middle lobectomy and node dissection   12/30/2015 Procedure   Right video-assisted thoracoscopy, Thoracoscopic right middle lobectomy, Mediastinal lymph node dissection, and On-Q local anesthetic catheter placement.   01/02/2016 Pathology Results   1. Lung, resection (segmental or lobe), Right Middle Lobe - INVASIVE ADENOCARCINOMA, WELL DIFFERENTIATED, SPANNING 1.2 CM. - THE SURGICAL RESECTION MARGINS ARE NEGATIVE FOR CARCINOMA. 2. Lymph node, biopsy, Level 12 - METASTATIC CARCINOMA IN 1 OF 1 LYMPH NODE (1/1). 3. Lymph node, biopsy, Level 12 #2 - METASTATIC CARCINOMA IN 1 OF 1 LYMPH NODE (1/1). 4. Lymph node, biopsy, Level 7 - METASTATIC CARCINOMA IN 1 OF 1 LYMPH NODE (1/1/). 5. Lymph node, biopsy, Level 7 #2 - METASTATIC CARCINOMA IN 1 OF 1 LYMPH NODE (1/1). 6. Lymph node, biopsy, Level 7 #3 - METASTATIC CARCINOMA IN 1 OF 1 LYMPH NODE (1/1). 7. Lymph node, biopsy, Level 7 #4 - METASTATIC CARCINOMA IN 1 OF 1 LYMPH NODE (1/1). 8. Lymph node, biopsy, 4R - THERE IS NO EVIDENCE OF CARCINOMA IN 1 OF 1 LYMPH NODE (0/1). 9. Lymph node, biopsy, 4R #2 - THERE IS NO EVIDENCE OF CARCINOMA IN 1 OF 1 LYMPH NODE (0/1).   01/05/2016 Pathology Results   PDL1 NEGATIVE- tumor proportion score of 0%.    01/05/2016  Pathology Results   Genomic alterations identified: BRAF V600E, KIT amplification, PDGFRA amplification, CDKN2A/B loss, TP53 S54f*33.  Additional findings: MSI-STABLE.  No reportable alterations identified: EGFR, KRAS, ALK, MET, RET, ERBB2, ROS1   02/09/2016 Procedure   Port placed by IR.   02/16/2016 - 05/31/2016 Chemotherapy   Carboplatin/Pemetrexed x 6 cycles     - 08/01/2016 Radiation Therapy   [place], [place]   08/28/2016 Imaging   CT CAP- No evidence of recurrent or metastatic carcinoma within the chest, abdomen, or pelvis.  Stable incidental findings include a absent, small benign right adrenal adenoma, sigmoid diverticulosis, and aortic atherosclerosis.   09/04/2016 -  Chemotherapy   Imfinzi immunotherapy up to 52 weeks.    12/07/2016 Imaging   CT C/A/P: New focal streaky opacities within the peripheral posterior right lower lobe and anteromedial left upper lobe- likely atelectasis with infection or other process is less likely. Recommend attention to these areas on future scans.  Small right pleural effusion, slightly increased from 08/28/2016. No evidence of pleural mass or enhancement.  No evidence of malignancy/ metastatic disease within the abdomen or pelvis.    02/12/2017 Imaging   CT chest: IMPRESSION: 1. Stable CT of the chest. No specific findings identified to suggest residual or recurrence of tumor. 2. Persistent right pleural effusion. 3. Paramediastinal radiation change predominantly involving the right lung is similar to previous study. 4. Stable right adrenal gland nodule and low-density foci within the liver. 5. Aortic Atherosclerosis (ICD10-I70.0) and Emphysema (ICD10-J43.9). Multi vessel coronary artery calcifications noted.  CANCER STAGING: Cancer Staging Adenocarcinoma of right lung, stage 3 (HCC) Staging form: Lung, AJCC 7th Edition - Clinical stage from 12/09/2015: Stage IA (T1a, N0, M0) - Signed by Baird Cancer, PA-C on 12/09/2015 -  Pathologic stage from 01/20/2016: Stage IIIA (T1a, N2, cM0) - Signed by Baird Cancer, PA-C on 02/01/2016    INTERVAL HISTORY:  [name] 85 y.o. male returns for routine follow-up for stage III lung cancer and iron deficiency anemia.  He reports he is having dizziness with occasional falls.  He denies any bright red bleeding per rectum or melena.  Denies any fatigue. Denies any nausea, vomiting, or diarrhea. Denies any new pains. Had not noticed any recent bleeding such as epistaxis, hematuria or hematochezia. Denies recent chest pain on exertion, shortness of breath on minimal exertion, pre-syncopal episodes, or palpitations. Denies any numbness or tingling in hands or feet. Denies any recent fevers, infections, or recent hospitalizations. Patient reports appetite at 75 % and energy level at 75 %.  He is eating well and maintaining his weight at this time.     REVIEW OF SYSTEMS:  Review of Systems  Neurological: Positive for dizziness and light-headedness.  All other systems reviewed and are negative.    PAST MEDICAL/SURGICAL HISTORY:  Past Medical History:  Diagnosis Date  . Adenocarcinoma of lung, stage 3 (HCC)    Stage IIIA  . Adenocarcinoma of right lung, stage 3 ([facility]) 12/02/2015  . Arthritis   . Atrial fibrillation, transient ([facility])    transient postop, < 24 hours  . B12 deficiency   . Cyst of scrotum   . Diverticulitis   . GERD (gastroesophageal reflux disease)   . Hernia   . History of pneumonia   . Hyperlipidemia   . Lung nodule    Spiculated right middle lobe nodule, hypermetabolic  . Stroke ([facility])   . Type 2 diabetes mellitus ([facility])    Past Surgical History:  Procedure Laterality Date  . BACK SURGERY    . CHOLECYSTECTOMY    . COLONOSCOPY N/A 11/24/2015   Procedure: COLONOSCOPY;  Surgeon: Rogene Houston, MD;  Location[facility];  Service: Endoscopy;  Laterality: N/A;  730  . COLONOSCOPY W/ POLYPECTOMY     x 5  . CYST EXCISION     Scrotum  . EYE SURGERY  Bilateral    Cataract with Lens  . HERNIA REPAIR Right    Inguinal  . IR GENERIC HISTORICAL  02/09/2016   IR US GUIDE VASC ACCESS RIGHT 02/09/2016 Arne Cleveland, MD [facility]  . IR GENERIC HISTORICAL  02/09/2016   IR FLUORO GUIDE CV LINE RIGHT 02/09/2016 Arne Cleveland, MD [facility]  . LUMBAR DISC SURGERY     per patient report  . PORTACATH PLACEMENT    . VIDEO ASSISTED THORACOSCOPY (VATS)/ LOBECTOMY Right 12/30/2015   Procedure: VIDEO ASSISTED THORACOSCOPY (VATS)/RIGHT MIDDLE LOBECTOMY;  Surgeon: Melrose Nakayama, MD;  Location: [facility];  Service: Thoracic;  Laterality: Right;     SOCIAL HISTORY:  Social History   Socioeconomic History  . Marital status: Married    Spouse name: Not on file  . Number of children: Not on file  . Years of education: Not on file  . Highest education level: Not on file  Occupational History  . Not on file  Social Needs  . Financial resource strain: Not on file  . Food insecurity    Worry: Not on file    Inability: Not on file  . Transportation needs  Medical: Not on file    Non-medical: Not on file  Tobacco Use  . Smoking status: Former Smoker    Types: Cigarettes    Quit date: 05/27/2004    Years since quitting: 14.5  . Smokeless tobacco: Never Used  Substance and Sexual Activity  . Alcohol use: No    Alcohol/week: 0.0 standard drinks  . Drug use: No  . Sexual activity: Not on file    Comment: married  Lifestyle  . Physical activity    Days per week: Not on file    Minutes per session: Not on file  . Stress: Not on file  Relationships  . Social Herbalist on phone: Not on file    Gets together: Not on file    Attends religious service: Not on file    Active member of club or organization: Not on file    Attends meetings of clubs or organizations: Not on file    Relationship status: Not on file  . Intimate partner violence    Fear of current or ex partner: Not on file    Emotionally abused: Not on file     Physically abused: Not on file    Forced sexual activity: Not on file  Other Topics Concern  . Not on file  Social History Narrative  . Not on file    FAMILY HISTORY:  Family History  Problem Relation Age of Onset  . Colon cancer Brother     CURRENT MEDICATIONS:  Outpatient Encounter Medications as of 11/28/2018  Medication Sig  . ACCU-CHEK AVIVA PLUS test strip USE 5 STRIPS DAILY TO MONITOR FLUCTUATING BLOOD SUGAR  . ACCU-CHEK FASTCLIX LANCETS MISC USE 6 LANCETS DAILY  . ALPRAZolam (XANAX) 0.5 MG tablet Take 0.5 mg by mouth daily.  Marland Kitchen aspirin EC 81 MG tablet Take 81 mg by mouth daily.  . calcium carbonate (OS-CAL - DOSED IN MG OF ELEMENTAL CALCIUM) 1250 (500 Ca) MG tablet Take 1 tablet (500 mg of elemental calcium total) by mouth daily with breakfast.  . Cholecalciferol (VITAMIN D) 2000 UNITS CAPS Take 1 capsule by mouth daily.   . Cyanocobalamin (VITAMIN B-12 IJ) Inject 1 Dose as directed every 30 (thirty) days.  Marland Kitchen glipiZIDE (GLUCOTROL) 5 MG tablet Take 1 tablet (5 mg total) by mouth 2 (two) times daily before a meal. (Patient taking differently: Take 2.5 mg by mouth 2 (two) times daily before a meal. )  . lidocaine-prilocaine (EMLA) cream Apply a quarter size amount to port site 1 hour prior to chemo. Do not rub in. Cover with plastic wrap.  . lisinopril (PRINIVIL,ZESTRIL) 40 MG tablet Take 1 tablet (40 mg total) by mouth daily.  Marland Kitchen loratadine (CLARITIN) 10 MG tablet Take 10 mg by mouth daily.  . meclizine (ANTIVERT) 25 MG tablet Take 25 mg by mouth 3 (three) times daily.   . ondansetron (ZOFRAN) 8 MG tablet Take 1 tablet (8 mg total) by mouth every 8 (eight) hours as needed for nausea or vomiting.  . pantoprazole (PROTONIX) 40 MG tablet Take 40 mg by mouth 2 (two) times daily.   . potassium chloride SA (K-DUR,KLOR-CON) 20 MEQ tablet Take 1 tablet (20 mEq total) by mouth 2 (two) times daily.  . prochlorperazine (COMPAZINE) 10 MG tablet TAKE ONE TABLET BY MOUTH EVERY 6 HOURS AS NEEDED  FOR NAUSEA AND VOMITING  . simvastatin (ZOCOR) 40 MG tablet Take 40 mg by mouth at bedtime.   Marland Kitchen terazosin (HYTRIN) 5 MG capsule Take 5  mg by mouth at bedtime.  Marland Kitchen tiZANidine (ZANAFLEX) 4 MG tablet Take 4 mg by mouth at bedtime.  Marland Kitchen zolpidem (AMBIEN) 10 MG tablet Take 0.5 tablets (5 mg total) by mouth at bedtime as needed for sleep. For sleep   Facility-Administered Encounter Medications as of 11/28/2018  Medication  . cyanocobalamin ((VITAMIN B-12)) injection 1,000 mcg  . heparin lock flush 100 unit/mL  . sodium chloride flush (NS) 0.9 % injection 10 mL    ALLERGIES:  Allergies  Allergen Reactions  . Morphine And Related Other (See Comments)    confusion     PHYSICAL EXAM:  ECOG Performance status: 1  Vitals:   11/28/18 0900  BP: (!) 119/42  Pulse: 88  Resp: 16  Temp: 97.8 F (36.6 C)  SpO2: 96%   Filed Weights   11/28/18 0900  Weight: 212 lb (96.2 kg)    Physical Exam Constitutional:      Appearance: Normal appearance. He is normal weight.  Cardiovascular:     Rate and Rhythm: Normal rate and regular rhythm.     Heart sounds: Normal heart sounds.  Pulmonary:     Effort: Pulmonary effort is normal.     Breath sounds: Normal breath sounds.  Abdominal:     General: Bowel sounds are normal.     Palpations: Abdomen is soft.  Musculoskeletal: Normal range of motion.  Skin:    General: Skin is warm and dry.  Neurological:     Mental Status: He is alert and oriented to person, place, and time. Mental status is at baseline.  Psychiatric:        Mood and Affect: Mood normal.        Behavior: Behavior normal.        Thought Content: Thought content normal.        Judgment: Judgment normal.      LABORATORY DATA:  I have reviewed the labs as listed.  CBC    Component Value Date/Time   WBC 9.3 11/21/2018 0923   RBC 4.28 11/21/2018 0923   HGB 13.2 11/21/2018 0923   HCT 39.7 11/21/2018 0923   PLT 244 11/21/2018 0923   MCV 92.8 11/21/2018 0923   MCH 30.8  11/21/2018 0923   MCHC 33.2 11/21/2018 0923   RDW 12.8 11/21/2018 0923   LYMPHSABS 2.1 11/21/2018 0923   MONOABS 0.9 11/21/2018 0923   EOSABS 0.5 11/21/2018 0923   BASOSABS 0.1 11/21/2018 0923   CMP Latest Ref Rng & Units 11/21/2018 08/21/2018 05/20/2018  Glucose 70 - 99 mg/dL 99 304(H) 244(H)  BUN 8 - 23 mg/dL '11 13 12  ' Creatinine 0.61 - 1.24 mg/dL 1.13 1.08 1.20  Sodium 135 - 145 mmol/L 140 135 134(L)  Potassium 3.5 - 5.1 mmol/L 4.2 4.2 3.8  Chloride 98 - 111 mmol/L 104 101 104  CO2 22 - 32 mmol/L '28 26 24  ' Calcium 8.9 - 10.3 mg/dL 9.1 9.1 8.6(L)  Total Protein 6.5 - 8.1 g/dL 6.6 6.4(L) 6.5  Total Bilirubin 0.3 - 1.2 mg/dL 0.6 0.5 0.6  Alkaline Phos 38 - 126 U/L 67 71 63  AST 15 - 41 U/L '22 24 24  ' ALT 0 - 44 U/L '20 23 18       ' DIAGNOSTIC IMAGING:  I have independently reviewed the CT scans and discussed with the patient.   I personally performed a face-to-face visit.  All questions were answered to patient's stated satisfaction. Encouraged patient to call with any new concerns or questions before his next visit  to the cancer center and we can certain see him sooner, if needed.     ASSESSMENT & PLAN:   Adenocarcinoma of right lung, stage 3 (HCC) 1.  Stage IIIa right lung adenocarcinoma: - Diagnosed in June 2017, status post right below lobectomy, VATS with mediastinal node sampling, PDL 1 negative. - Status post carboplatin and pemetrexed for 6 cycles completed on 05/31/2016. -Radiation therapy completed 08/01/2016. - Consolidation Durvalumab from 09/04/2016 through 08/28/2017. -CT scan on 12/18/2017 showed small right pleural effusions, mildly increased, no evidence of recurrent or metastatic disease. - MRI of the brain on 12/18/2017 did not show any evidence of metastatic disease.  Showed chronic right cerebellar infarct which was small. -CT chest dated on 05/20/2018 showed stable perihilar radiation changes in the right lung.  Stable small right pleural effusions with  volume loss.  No evidence of lung cancer recurrence. -His breathing is stable to mildly improved.  Denies any changes in cough or hemoptysis. -Recent CT of the chest dated 11/21/2018 no findings to suggest recurrent tumor or metastatic disease.  5 mm left lower lobe lung nodule is stable from previous study.  Unchanged small right pleural effusion. -We will see him back in 3 months with repeat blood work.  2.  Iron deficiency anemia: - Last received Feraheme on 05/30/2018 and 06/06/2018. - Labs on labs on 11/21/2018 showed hemoglobin 13.2, platelets 244, WBC 9.3, ferritin 160, percent saturation 25, creatinine 1.13. -His TSH was repeated and was increased to 6.313 from 5.019.  Patient did not want to start on Synthroid at this time wishes to recheck it in 4 months. -We will repeat labs in 3 months with repeat labs.  3.  Dizziness: - He reports he has been dizzy for 3 months with occasional falls. - [facility] which he says [facility] slightly. - We will follow-up with him in 3 months if he is still having issues we will refer him to ENT and a CT of the head      Orders placed this encounter:  Orders Placed This Encounter  Procedures  . TSH  . Lactate dehydrogenase  . CBC with Differential/Platelet  . Comprehensive metabolic panel  . Ferritin  . Iron and TIBC  . Vitamin B12  . VITAMIN D 25 Hydroxy (Vit-D Deficiency, Fractures)  . Folate      Francene Finders, FNP-C [facility] [phone]

## 2018-11-28 NOTE — Patient Instructions (Signed)
[place] at [facility] Discharge Instructions  Follow-up in 4 months with repeat labs.   Thank you for choosing [place] at [facility] to provide your oncology and hematology care.  To afford each patient quality time with our provider, please arrive at least 15 minutes before your scheduled appointment time.   If you have a lab appointment with the [facility] please come in thru the  Main Entrance and check in at the main information desk  You need to re-schedule your appointment should you arrive 10 or more minutes late.  We strive to give you quality time with our providers, and arriving late affects you and other patients whose appointments are after yours.  Also, if you no show three or more times for appointments you may be dismissed from the clinic at the providers discretion.     Again, thank you for choosing [facility].  Our hope is that these requests will decrease the amount of time that you wait before being seen by our physicians.       _____________________________________________________________  Should you have questions after your visit to [facility], please contact our office at [phone] between the hours of 8:00 a.m. and 4:30 p.m.  Voicemails left after 4:00 p.m. will not be returned until the following business day.  For prescription refill requests, have your pharmacy contact our office and allow 72 hours.    Cancer Center Support Programs:   > Cancer Support Group  2nd Tuesday of the month 1pm-2pm, Journey Room

## 2018-12-29 ENCOUNTER — Other Ambulatory Visit: Payer: Self-pay

## 2018-12-29 ENCOUNTER — Inpatient Hospital Stay (HOSPITAL_COMMUNITY): Payer: Medicare Other | Attending: Hematology

## 2018-12-29 ENCOUNTER — Encounter (HOSPITAL_COMMUNITY): Payer: Self-pay

## 2018-12-29 ENCOUNTER — Other Ambulatory Visit: Payer: Medicare Other

## 2018-12-29 VITALS — BP 117/67 | HR 89 | Temp 97.7°F | Resp 18

## 2018-12-29 DIAGNOSIS — Z20822 Contact with and (suspected) exposure to covid-19: Secondary | ICD-10-CM

## 2018-12-29 DIAGNOSIS — E538 Deficiency of other specified B group vitamins: Secondary | ICD-10-CM

## 2018-12-29 MED ORDER — CYANOCOBALAMIN 1000 MCG/ML IJ SOLN
1000.0000 ug | Freq: Once | INTRAMUSCULAR | Status: AC
  Administered 2018-12-29: 1000 ug via INTRAMUSCULAR

## 2018-12-29 NOTE — Progress Notes (Signed)
To treatment area for B12 shot.  Per wifes note sent with the patient she is requesting the patient to be checked for COVID due to a low grade temp x 1, prn cough, generalized feeling bad, and dizziness, and color of skin.    Patient assessed and he stated his wife has been watching TV and thinks they both have COVID.  Bilateral lungs auscultated and clear.  Patient stated he has prn white/clear phlegm with cough that he takes medication for but no worsening of cough, had a low grade temp one day, and has a history of dizziness due to history of stroke and was told by his PCP they feel this is where the dizziness is coming from and takes medication.  He does not feel bad or any worsening of dizziness, and denied SOB.  Patients color pink and warm and dry to touch.  No s/s of distress noted with assessment.    Wife instructed over the telephone of the [facility] sites for testing if she feels they both need to be tested.   Patient tolerated injection with no complaints voiced.  Site clean and dry with no bruising or swelling noted at site.  Band aid applied.  Vss with discharge and left by wheelchair with no s/s of distress noted.

## 2019-01-02 LAB — NOVEL CORONAVIRUS, NAA: SARS-CoV-2, NAA: NOT DETECTED

## 2019-01-29 ENCOUNTER — Ambulatory Visit (HOSPITAL_COMMUNITY): Payer: Medicare Other

## 2019-02-02 ENCOUNTER — Other Ambulatory Visit: Payer: Self-pay

## 2019-02-02 ENCOUNTER — Inpatient Hospital Stay (HOSPITAL_COMMUNITY): Payer: Medicare Other | Attending: Hematology

## 2019-02-02 VITALS — BP 137/56 | HR 85 | Temp 97.1°F | Resp 18

## 2019-02-02 DIAGNOSIS — E538 Deficiency of other specified B group vitamins: Secondary | ICD-10-CM | POA: Diagnosis present

## 2019-02-02 MED ORDER — CYANOCOBALAMIN 1000 MCG/ML IJ SOLN
1000.0000 ug | Freq: Once | INTRAMUSCULAR | Status: AC
  Administered 2019-02-02: 1000 ug via INTRAMUSCULAR
  Filled 2019-02-02: qty 1

## 2019-02-02 NOTE — Patient Instructions (Signed)
[facility] at [facility]  Discharge Instructions:  B12 injection received today.  Cyanocobalamin, Vitamin B12 injection What is this medicine? CYANOCOBALAMIN (sye an oh koe BAL a min) is a man made form of vitamin B12. Vitamin B12 is used in the growth of healthy blood cells, nerve cells, and proteins in the body. It also helps with the metabolism of fats and carbohydrates. This medicine is used to treat people who can not absorb vitamin B12. This medicine may be used for other purposes; ask your health care provider or pharmacist if you have questions. COMMON BRAND NAME(S): B-12 Compliance Kit, B-12 Injection Kit, Cyomin, LA-12, Nutri-Twelve, Physicians EZ Use B-12, Primabalt What should I tell my health care provider before I take this medicine? They need to know if you have any of these conditions:  kidney disease  Leber's disease  megaloblastic anemia  an unusual or allergic reaction to cyanocobalamin, cobalt, other medicines, foods, dyes, or preservatives  pregnant or trying to get pregnant  breast-feeding How should I use this medicine? This medicine is injected into a muscle or deeply under the skin. It is usually given by a health care professional in a clinic or doctor's office. However, your doctor may teach you how to inject yourself. Follow all instructions. Talk to your pediatrician regarding the use of this medicine in children. Special care may be needed. Overdosage: If you think you have taken too much of this medicine contact a poison control center or emergency room at once. NOTE: This medicine is only for you. Do not share this medicine with others. What if I miss a dose? If you are given your dose at a clinic or doctor's office, call to reschedule your appointment. If you give your own injections and you miss a dose, take it as soon as you can. If it is almost time for your next dose, take only that dose. Do not take double or extra  doses. What may interact with this medicine?  colchicine  heavy alcohol intake This list may not describe all possible interactions. Give your health care provider a list of all the medicines, herbs, non-prescription drugs, or dietary supplements you use. Also tell them if you smoke, drink alcohol, or use illegal drugs. Some items may interact with your medicine. What should I watch for while using this medicine? Visit your doctor or health care professional regularly. You may need blood work done while you are taking this medicine. You may need to follow a special diet. Talk to your doctor. Limit your alcohol intake and avoid smoking to get the best benefit. What side effects may I notice from receiving this medicine? Side effects that you should report to your doctor or health care professional as soon as possible:  allergic reactions like skin rash, itching or hives, swelling of the face, lips, or tongue  blue tint to skin  chest tightness, pain  difficulty breathing, wheezing  dizziness  red, swollen painful area on the leg Side effects that usually do not require medical attention (report to your doctor or health care professional if they continue or are bothersome):  diarrhea  headache This list may not describe all possible side effects. Call your doctor for medical advice about side effects. You may report side effects to FDA at 1-800-FDA-1088. Where should I keep my medicine? Keep out of the reach of children. Store at room temperature between 15 and 30 degrees C (59 and 85 degrees F). Protect from light. Throw away   any unused medicine after the expiration date. NOTE: This sheet is a summary. It may not cover all possible information. If you have questions about this medicine, talk to your doctor, pharmacist, or health care provider.  2020 Elsevier/Gold Standard (2007-09-08 22:10:20)  _______________________________________________________________  Thank you for  choosing [facility] at [facility] to provide your oncology and hematology care.  To afford each patient quality time with our providers, please arrive at least 15 minutes before your scheduled appointment.  You need to re-schedule your appointment if you arrive 10 or more minutes late.  We strive to give you quality time with our providers, and arriving late affects you and other patients whose appointments are after yours.  Also, if you no show three or more times for appointments you may be dismissed from the clinic.  Again, thank you for choosing [facility] at [facility]. Our hope is that these requests will allow you access to exceptional care and in a timely manner. _______________________________________________________________  If you have questions after your visit, please contact our office at [phone] between the hours of 8:30 a.m. and 5:00 p.m. Voicemails left after 4:30 p.m. will not be returned until the following business day. _______________________________________________________________  For prescription refill requests, have your pharmacy contact our office. _______________________________________________________________  Recommendations made by the consultant and any test results will be sent to your referring physician. _______________________________________________________________ 

## 2019-02-02 NOTE — Progress Notes (Signed)
[name] presents today for B12 injection. VSS. Injection tolerated without incident or complaint. See MAR for details. Patient discharged in satisfactory condition with follow up instructions.

## 2019-03-02 ENCOUNTER — Inpatient Hospital Stay (HOSPITAL_COMMUNITY): Payer: Medicare Other | Attending: Hematology

## 2019-03-02 ENCOUNTER — Other Ambulatory Visit: Payer: Self-pay

## 2019-03-02 VITALS — BP 104/55 | HR 91 | Temp 97.3°F | Resp 18

## 2019-03-02 DIAGNOSIS — Z23 Encounter for immunization: Secondary | ICD-10-CM | POA: Insufficient documentation

## 2019-03-02 DIAGNOSIS — E538 Deficiency of other specified B group vitamins: Secondary | ICD-10-CM | POA: Diagnosis not present

## 2019-03-02 MED ORDER — INFLUENZA VAC A&B SA ADJ QUAD 0.5 ML IM PRSY
0.5000 mL | PREFILLED_SYRINGE | Freq: Once | INTRAMUSCULAR | Status: AC
  Administered 2019-03-02: 0.5 mL via INTRAMUSCULAR

## 2019-03-02 MED ORDER — CYANOCOBALAMIN 1000 MCG/ML IJ SOLN
INTRAMUSCULAR | Status: AC
  Filled 2019-03-02: qty 1

## 2019-03-02 MED ORDER — INFLUENZA VAC A&B SA ADJ QUAD 0.5 ML IM PRSY
PREFILLED_SYRINGE | INTRAMUSCULAR | Status: AC
  Filled 2019-03-02: qty 0.5

## 2019-03-02 MED ORDER — CYANOCOBALAMIN 1000 MCG/ML IJ SOLN
1000.0000 ug | Freq: Once | INTRAMUSCULAR | Status: AC
  Administered 2019-03-02: 1000 ug via INTRAMUSCULAR

## 2019-03-02 NOTE — Progress Notes (Signed)
[name] presents today for B12 injection. VSS. Injection tolerated without incident or complaint. See MAR for details. Patient discharged in satisfactory condition with follow up instructions.

## 2019-03-02 NOTE — Patient Instructions (Signed)
[place] at [facility]  Discharge Instructions:  Today you received your B12 injection and your flu shot. Follow up next month as scheduled. _______________________________________________________________  Thank you for choosing [place] at [facility] to provide your oncology and hematology care.  To afford each patient quality time with our providers, please arrive at least 15 minutes before your scheduled appointment.  You need to re-schedule your appointment if you arrive 10 or more minutes late.  We strive to give you quality time with our providers, and arriving late affects you and other patients whose appointments are after yours.  Also, if you no show three or more times for appointments you may be dismissed from the clinic.  Again, thank you for choosing [facility] at [place] hope is that these requests will allow you access to exceptional care and in a timely manner. _______________________________________________________________  If you have questions after your visit, please contact our office at [phone] between the hours of 8:30 a.m. and 5:00 p.m. Voicemails left after 4:30 p.m. will not be returned until the following business day. _______________________________________________________________  For prescription refill requests, have your pharmacy contact our office. _______________________________________________________________  Recommendations made by the consultant and any test results will be sent to your referring physician. _______________________________________________________________

## 2019-03-25 ENCOUNTER — Other Ambulatory Visit (HOSPITAL_COMMUNITY): Payer: Medicare Other

## 2019-03-26 ENCOUNTER — Inpatient Hospital Stay (HOSPITAL_COMMUNITY): Payer: Medicare Other | Attending: Hematology

## 2019-03-26 ENCOUNTER — Encounter (HOSPITAL_COMMUNITY): Payer: Self-pay

## 2019-03-26 ENCOUNTER — Other Ambulatory Visit: Payer: Self-pay

## 2019-03-26 DIAGNOSIS — R5383 Other fatigue: Secondary | ICD-10-CM | POA: Insufficient documentation

## 2019-03-26 DIAGNOSIS — C3491 Malignant neoplasm of unspecified part of right bronchus or lung: Secondary | ICD-10-CM

## 2019-03-26 DIAGNOSIS — Z85118 Personal history of other malignant neoplasm of bronchus and lung: Secondary | ICD-10-CM | POA: Diagnosis present

## 2019-03-26 DIAGNOSIS — D509 Iron deficiency anemia, unspecified: Secondary | ICD-10-CM | POA: Insufficient documentation

## 2019-03-26 LAB — CBC WITH DIFFERENTIAL/PLATELET
Abs Immature Granulocytes: 0.02 10*3/uL (ref 0.00–0.07)
Basophils Absolute: 0 10*3/uL (ref 0.0–0.1)
Basophils Relative: 1 %
Eosinophils Absolute: 0.5 10*3/uL (ref 0.0–0.5)
Eosinophils Relative: 7 %
HCT: 37.2 % — ABNORMAL LOW (ref 39.0–52.0)
Hemoglobin: 12.1 g/dL — ABNORMAL LOW (ref 13.0–17.0)
Immature Granulocytes: 0 %
Lymphocytes Relative: 21 %
Lymphs Abs: 1.6 10*3/uL (ref 0.7–4.0)
MCH: 30.4 pg (ref 26.0–34.0)
MCHC: 32.5 g/dL (ref 30.0–36.0)
MCV: 93.5 fL (ref 80.0–100.0)
Monocytes Absolute: 0.6 10*3/uL (ref 0.1–1.0)
Monocytes Relative: 9 %
Neutro Abs: 4.5 10*3/uL (ref 1.7–7.7)
Neutrophils Relative %: 62 %
Platelets: 213 10*3/uL (ref 150–400)
RBC: 3.98 MIL/uL — ABNORMAL LOW (ref 4.22–5.81)
RDW: 12.6 % (ref 11.5–15.5)
WBC: 7.3 10*3/uL (ref 4.0–10.5)
nRBC: 0 % (ref 0.0–0.2)

## 2019-03-26 LAB — FOLATE: Folate: 19.8 ng/mL (ref >5.9)

## 2019-03-26 LAB — IRON AND TIBC
Iron: 66 ug/dL (ref 45–182)
Saturation Ratios: 25 % (ref 17.9–39.5)
TIBC: 262 ug/dL (ref 250–450)
UIBC: 196 ug/dL

## 2019-03-26 LAB — VITAMIN D 25 HYDROXY (VIT D DEFICIENCY, FRACTURES): Vit D, 25-Hydroxy: 66.47 ng/mL (ref 30–100)

## 2019-03-26 LAB — COMPREHENSIVE METABOLIC PANEL
ALT: 19 U/L (ref 0–44)
AST: 23 U/L (ref 15–41)
Albumin: 3.2 g/dL — ABNORMAL LOW (ref 3.5–5.0)
Alkaline Phosphatase: 61 U/L (ref 38–126)
Anion gap: 5 (ref 5–15)
BUN: 13 mg/dL (ref 8–23)
CO2: 25 mmol/L (ref 22–32)
Calcium: 8.3 mg/dL — ABNORMAL LOW (ref 8.9–10.3)
Chloride: 103 mmol/L (ref 98–111)
Creatinine, Ser: 1.06 mg/dL (ref 0.61–1.24)
GFR calc Af Amer: >60 mL/min (ref >60)
GFR calc non Af Amer: >60 mL/min (ref >60)
Glucose, Bld: 165 mg/dL — ABNORMAL HIGH (ref 70–99)
Potassium: 3.7 mmol/L (ref 3.5–5.1)
Sodium: 133 mmol/L — ABNORMAL LOW (ref 135–145)
Total Bilirubin: 0.6 mg/dL (ref 0.3–1.2)
Total Protein: 6.2 g/dL — ABNORMAL LOW (ref 6.5–8.1)

## 2019-03-26 LAB — FERRITIN: Ferritin: 158 ng/mL (ref 24–336)

## 2019-03-26 LAB — TSH: TSH: 3.599 u[IU]/mL (ref 0.350–4.500)

## 2019-03-26 LAB — VITAMIN B12: Vitamin B-12: 439 pg/mL (ref 180–914)

## 2019-03-26 LAB — LACTATE DEHYDROGENASE: LDH: 143 U/L (ref 98–192)

## 2019-03-26 MED ORDER — SODIUM CHLORIDE 0.9% FLUSH
10.0000 mL | Freq: Once | INTRAVENOUS | Status: AC
  Administered 2019-03-26: 10 mL via INTRAVENOUS

## 2019-03-26 MED ORDER — HEPARIN SOD (PORK) LOCK FLUSH 100 UNIT/ML IV SOLN
500.0000 [IU] | Freq: Once | INTRAVENOUS | Status: AC
  Administered 2019-03-26: 500 [IU] via INTRAVENOUS

## 2019-03-26 NOTE — Progress Notes (Signed)
Patients port flushed without difficulty.  Good blood return noted with no bruising or swelling noted at site.  Band aid applied.  VSS with discharge and left in a wheelchair with no s/s of distress noted

## 2019-04-01 ENCOUNTER — Ambulatory Visit (HOSPITAL_COMMUNITY): Payer: Medicare Other | Admitting: Hematology

## 2019-04-01 ENCOUNTER — Ambulatory Visit (HOSPITAL_COMMUNITY): Payer: Medicare Other

## 2019-04-02 ENCOUNTER — Inpatient Hospital Stay (HOSPITAL_BASED_OUTPATIENT_CLINIC_OR_DEPARTMENT_OTHER): Payer: Medicare Other | Admitting: Hematology

## 2019-04-02 ENCOUNTER — Encounter (HOSPITAL_COMMUNITY): Payer: Self-pay | Admitting: Hematology

## 2019-04-02 ENCOUNTER — Other Ambulatory Visit: Payer: Self-pay

## 2019-04-02 ENCOUNTER — Inpatient Hospital Stay (HOSPITAL_COMMUNITY): Payer: Medicare Other

## 2019-04-02 VITALS — BP 105/44 | HR 78 | Temp 97.9°F | Resp 18 | Wt 211.2 lb

## 2019-04-02 VITALS — BP 116/58

## 2019-04-02 DIAGNOSIS — Z85118 Personal history of other malignant neoplasm of bronchus and lung: Secondary | ICD-10-CM | POA: Diagnosis not present

## 2019-04-02 DIAGNOSIS — E538 Deficiency of other specified B group vitamins: Secondary | ICD-10-CM

## 2019-04-02 DIAGNOSIS — C3491 Malignant neoplasm of unspecified part of right bronchus or lung: Secondary | ICD-10-CM

## 2019-04-02 MED ORDER — CYANOCOBALAMIN 1000 MCG/ML IJ SOLN
1000.0000 ug | Freq: Once | INTRAMUSCULAR | Status: AC
  Administered 2019-04-02: 1000 ug via INTRAMUSCULAR
  Filled 2019-04-02: qty 1

## 2019-04-02 NOTE — Progress Notes (Signed)
[name], [place] [address]   CLINIC:  Medical Oncology/Hematology  PCP:  [name], [place] [address] [phone]   REASON FOR VISIT:  Follow-up for Lung [name]    CURRENT THERAPY: Clinical surveillance  BRIEF ONCOLOGIC HISTORY:  Oncology History  Adenocarcinoma of right lung, stage 3 ([name])  11/10/2015 Imaging   13 mm spiculated lesion seen in right middle lobe.   11/30/2015 PET scan   Spiculated right middle lobe nodule is mildly hypermetabolic and most consistent with a stage IA adenocarcinoma.   12/30/2015 Surgery   Right middle lobectomy and node dissection   12/30/2015 Procedure   Right video-assisted thoracoscopy, Thoracoscopic right middle lobectomy, Mediastinal lymph node dissection, and On-Q local anesthetic catheter placement.   01/02/2016 Pathology Results   1. Lung, resection (segmental or lobe), Right Middle Lobe - INVASIVE ADENOCARCINOMA, WELL DIFFERENTIATED, SPANNING 1.2 CM. - THE SURGICAL RESECTION MARGINS ARE NEGATIVE FOR CARCINOMA. 2. Lymph node, biopsy, Level 12 - METASTATIC CARCINOMA IN 1 OF 1 LYMPH NODE (1/1). 3. Lymph node, biopsy, Level 12 #2 - METASTATIC CARCINOMA IN 1 OF 1 LYMPH NODE (1/1). 4. Lymph node, biopsy, Level 7 - METASTATIC CARCINOMA IN 1 OF 1 LYMPH NODE (1/1/). 5. Lymph node, biopsy, Level 7 #2 - METASTATIC CARCINOMA IN 1 OF 1 LYMPH NODE (1/1). 6. Lymph node, biopsy, Level 7 #3 - METASTATIC CARCINOMA IN 1 OF 1 LYMPH NODE (1/1). 7. Lymph node, biopsy, Level 7 #4 - METASTATIC CARCINOMA IN 1 OF 1 LYMPH NODE (1/1). 8. Lymph node, biopsy, 4R - THERE IS NO EVIDENCE OF CARCINOMA IN 1 OF 1 LYMPH NODE (0/1). 9. Lymph node, biopsy, 4R #2 - THERE IS NO EVIDENCE OF CARCINOMA IN 1 OF 1 LYMPH NODE (0/1).   01/05/2016 Pathology Results   PDL1 NEGATIVE- tumor proportion score of 0%.    01/05/2016 Pathology Results   Genomic alterations identified: BRAF V600E, KIT amplification, PDGFRA  amplification, CDKN2A/B loss, TP53 S74f*33.  Additional findings: MSI-STABLE.  No reportable alterations identified: EGFR, KRAS, ALK, MET, RET, ERBB2, ROS1   02/09/2016 Procedure   Port placed by IR.   02/16/2016 - 05/31/2016 Chemotherapy   Carboplatin/Pemetrexed x 6 cycles     - 08/01/2016 Radiation Therapy   [place], [place]   08/28/2016 Imaging   CT CAP- No evidence of recurrent or metastatic carcinoma within the chest, abdomen, or pelvis.  Stable incidental findings include a absent, small benign right adrenal adenoma, sigmoid diverticulosis, and aortic atherosclerosis.   09/04/2016 -  Chemotherapy   Imfinzi immunotherapy up to 52 weeks.    12/07/2016 Imaging   CT C/A/P: New focal streaky opacities within the peripheral posterior right lower lobe and anteromedial left upper lobe- likely atelectasis with infection or other process is less likely. Recommend attention to these areas on future scans.  Small right pleural effusion, slightly increased from 08/28/2016. No evidence of pleural mass or enhancement.  No evidence of malignancy/ metastatic disease within the abdomen or pelvis.    02/12/2017 Imaging   CT chest: IMPRESSION: 1. Stable CT of the chest. No specific findings identified to suggest residual or recurrence of tumor. 2. Persistent right pleural effusion. 3. Paramediastinal radiation change predominantly involving the right lung is similar to previous study. 4. Stable right adrenal gland nodule and low-density foci within the liver. 5. Aortic Atherosclerosis (ICD10-I70.0) and Emphysema (ICD10-J43.9). Multi vessel coronary artery calcifications noted.      [name] STAGING: [name] Staging Adenocarcinoma of right lung,  stage 3 (HCC) Staging form: Lung, AJCC 7th Edition - Clinical stage from 12/09/2015: Stage IA (T1a, N0, M0) - Signed by Gary Cancer, PA-C on 12/09/2015 - Pathologic stage from 01/20/2016: Stage IIIA (T1a, N2, cM0) - Signed by Gary Cancer,  PA-C on 02/01/2016    INTERVAL HISTORY:  [name] 85 y.o. male presents today for follow-up.  His wife accompanies him today.  Reports overall doing fair.  He does report mild to moderate fatigue.  He reports pain in the back of his head on the left side he denies any trauma to his head.  He states the pain has been there for a few months.  The pain has not gotten worse but it has not gotten better.  He does admit to some frequent falls over the last couple months.  He denies any headache or any vision change.  He denies any pulsating pain.  But just a constant dull pain in the back of the left side of his head.  He reports shortness of breath only with exertion that resolves with rest.  No chest pain.  Denies any fevers chills night sweats.  No weight loss.  No change in bowel habits.   REVIEW OF SYSTEMS:  Review of Systems  Constitutional: Positive for fatigue.  HENT:  Negative.   Eyes: Negative.   Respiratory: Negative.   Cardiovascular: Positive for leg swelling.  Gastrointestinal: Negative.   Endocrine: Negative.   Genitourinary: Negative.    Musculoskeletal: Positive for arthralgias and myalgias.  Skin: Negative.   Hematological: Negative.   Psychiatric/Behavioral: Negative.      PAST MEDICAL/SURGICAL HISTORY:  Past Medical History:  Diagnosis Date  . Adenocarcinoma of lung, stage 3 (HCC)    Stage IIIA  . Adenocarcinoma of right lung, stage 3 ([place]) 12/02/2015  . Arthritis   . Atrial fibrillation, transient ([place])    transient postop, < 24 hours  . B12 deficiency   . Cyst of scrotum   . Diverticulitis   . GERD (gastroesophageal reflux disease)   . Hernia   . History of pneumonia   . Hyperlipidemia   . Lung nodule    Spiculated right middle lobe nodule, hypermetabolic  . Stroke ([place])   . Type 2 diabetes mellitus ([place])    Past Surgical History:  Procedure Laterality Date  . BACK SURGERY    . CHOLECYSTECTOMY    . COLONOSCOPY N/A 11/24/2015   Procedure: COLONOSCOPY;   Surgeon: Rogene Houston, MD;  Location: [facility];  Service: Endoscopy;  Laterality: N/A;  730  . COLONOSCOPY W/ POLYPECTOMY     x 5  . CYST EXCISION     Scrotum  . EYE SURGERY Bilateral    Cataract with Lens  . HERNIA REPAIR Right    Inguinal  . IR GENERIC HISTORICAL  02/09/2016   IR US GUIDE VASC ACCESS RIGHT 02/09/2016 Arne Cleveland, MD [facility]-INTERV RAD  . IR GENERIC HISTORICAL  02/09/2016   IR FLUORO GUIDE CV LINE RIGHT 02/09/2016 Arne Cleveland, MD [facility]-INTERV RAD  . LUMBAR DISC SURGERY     per patient report  . PORTACATH PLACEMENT    . VIDEO ASSISTED THORACOSCOPY (VATS)/ LOBECTOMY Right 12/30/2015   Procedure: VIDEO ASSISTED THORACOSCOPY (VATS)/RIGHT MIDDLE LOBECTOMY;  Surgeon: Melrose Nakayama, MD;  Location: [facility];  Service: Thoracic;  Laterality: Right;     SOCIAL HISTORY:  Social History   Socioeconomic History  . Marital status: Married    Spouse name: Not on file  .  Number of children: Not on file  . Years of education: Not on file  . Highest education level: Not on file  Occupational History  . Not on file  Social Needs  . Financial resource strain: Not on file  . Food insecurity    Worry: Not on file    Inability: Not on file  . Transportation needs    Medical: Not on file    Non-medical: Not on file  Tobacco Use  . Smoking status: Former Smoker    Types: Cigarettes    Quit date: 05/27/2004    Years since quitting: 14.8  . Smokeless tobacco: Never Used  Substance and Sexual Activity  . Alcohol use: No    Alcohol/week: 0.0 standard drinks  . Drug use: No  . Sexual activity: Not on file    Comment: married  Lifestyle  . Physical activity    Days per week: Not on file    Minutes per session: Not on file  . Stress: Not on file  Relationships  . Social Herbalist on phone: Not on file    Gets together: Not on file    Attends religious service: Not on file    Active member of club or organization: Not on file    Attends meetings of  clubs or organizations: Not on file    Relationship status: Not on file  . Intimate partner violence    Fear of current or ex partner: Not on file    Emotionally abused: Not on file    Physically abused: Not on file    Forced sexual activity: Not on file  Other Topics Concern  . Not on file  Social History Narrative  . Not on file    FAMILY HISTORY:  Family History  Problem Relation Age of Onset  . Colon [name] Brother     CURRENT MEDICATIONS:  Outpatient Encounter Medications as of 04/02/2019  Medication Sig  . ACCU-CHEK AVIVA PLUS test strip USE 5 STRIPS DAILY TO MONITOR FLUCTUATING BLOOD SUGAR  . ACCU-CHEK FASTCLIX LANCETS MISC USE 6 LANCETS DAILY  . ALPRAZolam (XANAX) 0.5 MG tablet Take 0.5 mg by mouth daily.  Marland Kitchen aspirin EC 81 MG tablet Take 81 mg by mouth daily.  . calcium carbonate (OS-CAL - DOSED IN MG OF ELEMENTAL CALCIUM) 1250 (500 Ca) MG tablet Take 1 tablet (500 mg of elemental calcium total) by mouth daily with breakfast.  . Cholecalciferol (VITAMIN D) 2000 UNITS CAPS Take 1 capsule by mouth daily.   . Cyanocobalamin (VITAMIN B-12 IJ) Inject 1 Dose as directed every 30 (thirty) days.  Marland Kitchen glipiZIDE (GLUCOTROL) 5 MG tablet Take 1 tablet (5 mg total) by mouth 2 (two) times daily before a meal. (Patient taking differently: Take 2.5 mg by mouth 2 (two) times daily before a meal. )  . lidocaine-prilocaine (EMLA) cream Apply a quarter size amount to port site 1 hour prior to chemo. Do not rub in. Cover with plastic wrap.  . lisinopril (PRINIVIL,ZESTRIL) 40 MG tablet Take 1 tablet (40 mg total) by mouth daily.  Marland Kitchen loratadine (CLARITIN) 10 MG tablet Take 10 mg by mouth daily.  . meclizine (ANTIVERT) 25 MG tablet Take 25 mg by mouth 3 (three) times daily.   . ondansetron (ZOFRAN) 8 MG tablet Take 1 tablet (8 mg total) by mouth every 8 (eight) hours as needed for nausea or vomiting.  . pantoprazole (PROTONIX) 40 MG tablet Take 40 mg by mouth 2 (two) times daily.   Marland Kitchen  potassium  chloride SA (K-DUR,KLOR-CON) 20 MEQ tablet Take 1 tablet (20 mEq total) by mouth 2 (two) times daily.  . prochlorperazine (COMPAZINE) 10 MG tablet TAKE ONE TABLET BY MOUTH EVERY 6 HOURS AS NEEDED FOR NAUSEA AND VOMITING  . simvastatin (ZOCOR) 40 MG tablet Take 40 mg by mouth at bedtime.   Marland Kitchen terazosin (HYTRIN) 5 MG capsule Take 5 mg by mouth at bedtime.  Marland Kitchen tiZANidine (ZANAFLEX) 4 MG tablet Take 4 mg by mouth at bedtime.  Marland Kitchen zolpidem (AMBIEN) 10 MG tablet Take 0.5 tablets (5 mg total) by mouth at bedtime as needed for sleep. For sleep   Facility-Administered Encounter Medications as of 04/02/2019  Medication  . cyanocobalamin ((VITAMIN B-12)) injection 1,000 mcg  . [COMPLETED] cyanocobalamin ((VITAMIN B-12)) injection 1,000 mcg  . heparin lock flush 100 unit/mL  . sodium chloride flush (NS) 0.9 % injection 10 mL    ALLERGIES:  Allergies  Allergen Reactions  . Morphine And Related Other (See Comments)    confusion     PHYSICAL EXAM:  ECOG Performance status: 2  Vitals:   04/02/19 0859  BP: (!) 105/44  Pulse: 78  Resp: 18  Temp: 97.9 F (36.6 C)  SpO2: 95%   Filed Weights   04/02/19 0859  Weight: 211 lb 3.2 oz (95.8 kg)    Physical Exam Constitutional:      Appearance: Normal appearance. He is obese.  HENT:     Head: Normocephalic.     Right Ear: External ear normal.     Left Ear: External ear normal.     Nose: Nose normal.     Mouth/Throat:     Mouth: Mucous membranes are moist.     Pharynx: Oropharynx is clear.  Eyes:     Conjunctiva/sclera: Conjunctivae normal.  Neck:     Musculoskeletal: Normal range of motion.  Cardiovascular:     Rate and Rhythm: Normal rate and regular rhythm.     Pulses: Normal pulses.     Heart sounds: Normal heart sounds.  Pulmonary:     Effort: Pulmonary effort is normal.     Breath sounds: Normal breath sounds.  Abdominal:     General: Bowel sounds are normal.  Musculoskeletal: Normal range of motion.  Skin:    General: Skin is  warm.  Neurological:     General: No focal deficit present.     Mental Status: He is alert and oriented to person, place, and time.  Psychiatric:        Mood and Affect: Mood normal.        Behavior: Behavior normal.        Thought Content: Thought content normal.        Judgment: Judgment normal.      LABORATORY DATA:  I have reviewed the labs as listed.  CBC    Component Value Date/Time   WBC 7.3 03/26/2019 1436   RBC 3.98 (L) 03/26/2019 1436   HGB 12.1 (L) 03/26/2019 1436   HCT 37.2 (L) 03/26/2019 1436   PLT 213 03/26/2019 1436   MCV 93.5 03/26/2019 1436   MCH 30.4 03/26/2019 1436   MCHC 32.5 03/26/2019 1436   RDW 12.6 03/26/2019 1436   LYMPHSABS 1.6 03/26/2019 1436   MONOABS 0.6 03/26/2019 1436   EOSABS 0.5 03/26/2019 1436   BASOSABS 0.0 03/26/2019 1436   CMP Latest Ref Rng & Units 03/26/2019 11/21/2018 08/21/2018  Glucose 70 - 99 mg/dL 165(H) 99 304(H)  BUN 8 - 23 mg/dL 13 11 13  Creatinine 0.61 - 1.24 mg/dL 1.06 1.13 1.08  Sodium 135 - 145 mmol/L 133(L) 140 135  Potassium 3.5 - 5.1 mmol/L 3.7 4.2 4.2  Chloride 98 - 111 mmol/L 103 104 101  CO2 22 - 32 mmol/L _0 Calcium 8.9 - 10.3 mg/dL 8.3(L) 9.1 9.1  Total Protein 6.5 - 8.1 g/dL 6.2(L) 6.6 6.4(L)  Total Bilirubin 0.3 - 1.2 mg/dL 0.6 0.6 0.5  Alkaline Phos 38 - 126 U/L 61 67 71  AST 15 - 41 U/L _1 ALT 0 - 44 U/L _2 ASSESSMENT & PLAN:   Adenocarcinoma of right lung, stage 3 (HCC) 1.  Stage IIIa right lung adenocarcinoma: - Diagnosed in June 2017, status post right below lobectomy, VATS with mediastinal node sampling, PDL 1 negative. - Status post carboplatin and pemetrexed for 6 cycles completed on 05/31/2016. -Radiation therapy completed 08/01/2016. - Consolidation Durvalumab from 09/04/2016 through 08/28/2017. -CT scan on 12/18/2017 showed small right pleural effusions, mildly increased, no evidence of recurrent or metastatic disease. - MRI of the brain on 12/18/2017 did not  show any evidence of metastatic disease.  Showed chronic right cerebellar infarct which was small. -CT chest dated on 05/20/2018 showed stable perihilar radiation changes in the right lung.  Stable small right pleural effusions with volume loss.  No evidence of lung [name] recurrence. -His breathing is stable to mildly improved.  Denies any changes in cough or hemoptysis. -Recent CT of the chest dated 11/21/2018 no findings to suggest recurrent tumor or metastatic disease.  5 mm left lower lobe lung nodule is stable from previous study.  Unchanged small right pleural effusion. -Patient is clinical stable. Labs are acceptable. Plan to repeat CT Chest in 3 mths.   2.  Iron deficiency anemia: - Last received Feraheme on 05/30/2018 and 06/06/2018. -CBC is stable at 12.1, ferritin 158, iron 66, TIBC 262, saturation 25%.  There is no indication for parenteral iron at this time.   3.  Dizziness: - He reports he has been dizzy for 3 months with occasional falls. - [facility] which he says helped slightly. - MRI brain ordered- if negative will refer to ENT.       Orders placed this encounter:  Orders Placed This Encounter  Procedures  . CT Chest Wo Contrast  . MR [place] [phone]

## 2019-04-02 NOTE — Progress Notes (Signed)
[name] tolerated Vit B12 injection well without complaints or incident. VSS B/P 116/58 manually. Pt discharged via wheelchair in satisfactory condition accompanied by his wife

## 2019-04-02 NOTE — Assessment & Plan Note (Signed)
1.  Stage IIIa right lung adenocarcinoma: - Diagnosed in June 2017, status post right below lobectomy, VATS with mediastinal node sampling, PDL 1 negative. - Status post carboplatin and pemetrexed for 6 cycles completed on 05/31/2016. -Radiation therapy completed 08/01/2016. - Consolidation Durvalumab from 09/04/2016 through 08/28/2017. -CT scan on 12/18/2017 showed small right pleural effusions, mildly increased, no evidence of recurrent or metastatic disease. - MRI of the brain on 12/18/2017 did not show any evidence of metastatic disease.  Showed chronic right cerebellar infarct which was small. -CT chest dated on 05/20/2018 showed stable perihilar radiation changes in the right lung.  Stable small right pleural effusions with volume loss.  No evidence of lung cancer recurrence. -His breathing is stable to mildly improved.  Denies any changes in cough or hemoptysis. -Recent CT of the chest dated 11/21/2018 no findings to suggest recurrent tumor or metastatic disease.  5 mm left lower lobe lung nodule is stable from previous study.  Unchanged small right pleural effusion. -Patient is clinical stable. Labs are acceptable. Plan to repeat CT Chest in 3 mths.   2.  Iron deficiency anemia: - Last received Feraheme on 05/30/2018 and 06/06/2018. -CBC is stable at 12.1, ferritin 158, iron 66, TIBC 262, saturation 25%.  There is no indication for parenteral iron at this time.   3.  Dizziness: - He reports he has been dizzy for 3 months with occasional falls. - [facility] which he says helped slightly. - MRI brain ordered- if negative will refer to ENT.

## 2019-04-02 NOTE — Patient Instructions (Signed)
[facility] at [facility]  Discharge Instructions:  You saw Renee Nester, NP, today. _______________________________________________________________  Thank you for choosing [facility] at [facility] to provide your oncology and hematology care.  To afford each patient quality time with our providers, please arrive at least 15 minutes before your scheduled appointment.  You need to re-schedule your appointment if you arrive 10 or more minutes late.  We strive to give you quality time with our providers, and arriving late affects you and other patients whose appointments are after yours.  Also, if you no show three or more times for appointments you may be dismissed from the clinic.  Again, thank you for choosing [facility] at [facility]. Our hope is that these requests will allow you access to exceptional care and in a timely manner. _______________________________________________________________  If you have questions after your visit, please contact our office at [phone] between the hours of 8:30 a.m. and 5:00 p.m. Voicemails left after 4:30 p.m. will not be returned until the following business day. _______________________________________________________________  For prescription refill requests, have your pharmacy contact our office. _______________________________________________________________  Recommendations made by the consultant and any test results will be sent to your referring physician. _______________________________________________________________ 

## 2019-04-02 NOTE — Patient Instructions (Signed)
[facility] at [facility] Discharge Instructions  Received Vit B12 injection today. Follow-up as scheduled. Call clinic for any questions or concerns   Thank you for choosing [facility] at [facility] to provide your oncology and hematology care.  To afford each patient quality time with our provider, please arrive at least 15 minutes before your scheduled appointment time.   If you have a lab appointment with the Cancer Center please come in thru the Main Entrance and check in at the main information desk.  You need to re-schedule your appointment should you arrive 10 or more minutes late.  We strive to give you quality time with our providers, and arriving late affects you and other patients whose appointments are after yours.  Also, if you no show three or more times for appointments you may be dismissed from the clinic at the providers discretion.     Again, thank you for choosing [facility].  Our hope is that these requests will decrease the amount of time that you wait before being seen by our physicians.       _____________________________________________________________  Should you have questions after your visit to [facility], please contact our office at [phone] between the hours of 8:00 a.m. and 4:30 p.m.  Voicemails left after 4:00 p.m. will not be returned until the following business day.  For prescription refill requests, have your pharmacy contact our office and allow 72 hours.    Due to Covid, you will need to wear a mask upon entering the hospital. If you do not have a mask, a mask will be given to you at the Main Entrance upon arrival. For doctor visits, patients may have 1 support person with them. For treatment visits, patients can not have anyone with them due to social distancing guidelines and our immunocompromised population.     

## 2019-04-07 ENCOUNTER — Ambulatory Visit (HOSPITAL_COMMUNITY)
Admission: RE | Admit: 2019-04-07 | Discharge: 2019-04-07 | Disposition: A | Discharge status: Home / Self Care | Payer: Medicare Other | Source: Ambulatory Visit | Attending: Hematology | Admitting: Hematology

## 2019-04-07 ENCOUNTER — Other Ambulatory Visit: Payer: Self-pay

## 2019-04-07 DIAGNOSIS — C3491 Malignant neoplasm of unspecified part of right bronchus or lung: Secondary | ICD-10-CM | POA: Diagnosis present

## 2019-04-07 MED ORDER — GADOBUTROL 1 MMOL/ML IV SOLN
8.0000 mL | Freq: Once | INTRAVENOUS | Status: AC | PRN
  Administered 2019-04-07: 8 mL via INTRAVENOUS

## 2019-05-01 ENCOUNTER — Ambulatory Visit (HOSPITAL_COMMUNITY): Payer: Medicare Other

## 2019-05-01 ENCOUNTER — Other Ambulatory Visit: Payer: Self-pay

## 2019-05-01 ENCOUNTER — Encounter (HOSPITAL_COMMUNITY): Payer: Self-pay

## 2019-05-01 ENCOUNTER — Inpatient Hospital Stay (HOSPITAL_COMMUNITY): Payer: Medicare Other | Attending: Hematology

## 2019-05-01 VITALS — BP 133/51 | HR 83 | Temp 97.5°F

## 2019-05-01 DIAGNOSIS — C342 Malignant neoplasm of middle lobe, bronchus or lung: Secondary | ICD-10-CM | POA: Diagnosis present

## 2019-05-01 DIAGNOSIS — E538 Deficiency of other specified B group vitamins: Secondary | ICD-10-CM

## 2019-05-01 MED ORDER — CYANOCOBALAMIN 1000 MCG/ML IJ SOLN
1000.0000 ug | Freq: Once | INTRAMUSCULAR | Status: AC
  Administered 2019-05-01: 1000 ug via INTRAMUSCULAR
  Filled 2019-05-01: qty 1

## 2019-05-01 NOTE — Progress Notes (Signed)
B12 injection given today per orders.  Patient tolerated it well without problems. Vitals stable and discharged home from clinic via wheelchair. Follow up as scheduled.

## 2019-05-01 NOTE — Patient Instructions (Signed)
[facility] at [facility]  Discharge Instructions:   _______________________________________________________________  Thank you for choosing [facility] at [facility] to provide your oncology and hematology care.  To afford each patient quality time with our providers, please arrive at least 15 minutes before your scheduled appointment.  You need to re-schedule your appointment if you arrive 10 or more minutes late.  We strive to give you quality time with our providers, and arriving late affects you and other patients whose appointments are after yours.  Also, if you no show three or more times for appointments you may be dismissed from the clinic.  Again, thank you for choosing [facility] at [facility]. Our hope is that these requests will allow you access to exceptional care and in a timely manner. _______________________________________________________________  If you have questions after your visit, please contact our office at [phone] between the hours of 8:30 a.m. and 5:00 p.m. Voicemails left after 4:30 p.m. will not be returned until the following business day. _______________________________________________________________  For prescription refill requests, have your pharmacy contact our office. _______________________________________________________________  Recommendations made by the consultant and any test results will be sent to your referring physician. _______________________________________________________________ 

## 2019-05-29 ENCOUNTER — Inpatient Hospital Stay (HOSPITAL_COMMUNITY): Payer: Medicare Other | Attending: Hematology

## 2019-05-29 ENCOUNTER — Ambulatory Visit (HOSPITAL_COMMUNITY): Payer: Medicare Other

## 2019-05-29 ENCOUNTER — Other Ambulatory Visit: Payer: Self-pay

## 2019-05-29 VITALS — BP 121/48 | HR 80 | Temp 97.7°F | Resp 18

## 2019-05-29 DIAGNOSIS — E538 Deficiency of other specified B group vitamins: Secondary | ICD-10-CM | POA: Diagnosis present

## 2019-05-29 MED ORDER — CYANOCOBALAMIN 1000 MCG/ML IJ SOLN
1000.0000 ug | Freq: Once | INTRAMUSCULAR | Status: AC
  Administered 2019-05-29: 1000 ug via INTRAMUSCULAR
  Filled 2019-05-29: qty 1

## 2019-05-29 NOTE — Patient Instructions (Signed)
[facility] Discharge Instructions for Patients Receiving Chemotherapy  Today you received the following chemotherapy agents   To help prevent nausea and vomiting after your treatment, we encourage you to take your nausea medication   If you develop nausea and vomiting that is not controlled by your nausea medication, call the clinic.   BELOW ARE SYMPTOMS THAT SHOULD BE REPORTED IMMEDIATELY:  *FEVER GREATER THAN 100.5 F  *CHILLS WITH OR WITHOUT FEVER  NAUSEA AND VOMITING THAT IS NOT CONTROLLED WITH YOUR NAUSEA MEDICATION  *UNUSUAL SHORTNESS OF BREATH  *UNUSUAL BRUISING OR BLEEDING  TENDERNESS IN MOUTH AND THROAT WITH OR WITHOUT PRESENCE OF ULCERS  *URINARY PROBLEMS  *BOWEL PROBLEMS  UNUSUAL RASH Items with * indicate a potential emergency and should be followed up as soon as possible.  Feel free to call the clinic should you have any questions or concerns. The clinic phone number is [phone].  Please show the CHEMO ALERT CARD at check-in to the Emergency Department and triage nurse.   

## 2019-05-29 NOTE — Progress Notes (Signed)
B12 injection given per orders. Patient tolerated it well without problems. Vitals stable and discharged home from clinic via wheelchair. Follow up as scheduled.  

## 2019-06-29 ENCOUNTER — Other Ambulatory Visit: Payer: Self-pay

## 2019-06-29 ENCOUNTER — Inpatient Hospital Stay (HOSPITAL_COMMUNITY): Payer: Medicare Other | Attending: Hematology

## 2019-06-29 ENCOUNTER — Ambulatory Visit (HOSPITAL_COMMUNITY)
Admission: RE | Admit: 2019-06-29 | Discharge: 2019-06-29 | Disposition: A | Discharge status: Home / Self Care | Payer: Medicare Other | Source: Ambulatory Visit | Attending: Hematology | Admitting: Hematology

## 2019-06-29 DIAGNOSIS — Z923 Personal history of irradiation: Secondary | ICD-10-CM | POA: Insufficient documentation

## 2019-06-29 DIAGNOSIS — E119 Type 2 diabetes mellitus without complications: Secondary | ICD-10-CM | POA: Diagnosis not present

## 2019-06-29 DIAGNOSIS — C3491 Malignant neoplasm of unspecified part of right bronchus or lung: Secondary | ICD-10-CM | POA: Diagnosis present

## 2019-06-29 DIAGNOSIS — Z9221 Personal history of antineoplastic chemotherapy: Secondary | ICD-10-CM | POA: Insufficient documentation

## 2019-06-29 DIAGNOSIS — I4891 Unspecified atrial fibrillation: Secondary | ICD-10-CM | POA: Insufficient documentation

## 2019-06-29 DIAGNOSIS — E538 Deficiency of other specified B group vitamins: Secondary | ICD-10-CM | POA: Insufficient documentation

## 2019-06-29 DIAGNOSIS — I2510 Atherosclerotic heart disease of native coronary artery without angina pectoris: Secondary | ICD-10-CM | POA: Insufficient documentation

## 2019-06-29 LAB — FERRITIN: Ferritin: 115 ng/mL (ref 24–336)

## 2019-06-29 LAB — IRON AND TIBC
Iron: 58 ug/dL (ref 45–182)
Saturation Ratios: 22 % (ref 17.9–39.5)
TIBC: 262 ug/dL (ref 250–450)
UIBC: 204 ug/dL

## 2019-06-29 LAB — CBC WITH DIFFERENTIAL/PLATELET
Abs Immature Granulocytes: 0.02 10*3/uL (ref 0.00–0.07)
Basophils Absolute: 0.1 10*3/uL (ref 0.0–0.1)
Basophils Relative: 1 %
Eosinophils Absolute: 0.4 10*3/uL (ref 0.0–0.5)
Eosinophils Relative: 5 %
HCT: 39 % (ref 39.0–52.0)
Hemoglobin: 12.7 g/dL — ABNORMAL LOW (ref 13.0–17.0)
Immature Granulocytes: 0 %
Lymphocytes Relative: 20 %
Lymphs Abs: 1.6 10*3/uL (ref 0.7–4.0)
MCH: 30.2 pg (ref 26.0–34.0)
MCHC: 32.6 g/dL (ref 30.0–36.0)
MCV: 92.9 fL (ref 80.0–100.0)
Monocytes Absolute: 0.7 10*3/uL (ref 0.1–1.0)
Monocytes Relative: 10 %
Neutro Abs: 5 10*3/uL (ref 1.7–7.7)
Neutrophils Relative %: 64 %
Platelets: 246 10*3/uL (ref 150–400)
RBC: 4.2 MIL/uL — ABNORMAL LOW (ref 4.22–5.81)
RDW: 12.7 % (ref 11.5–15.5)
WBC: 7.8 10*3/uL (ref 4.0–10.5)
nRBC: 0 % (ref 0.0–0.2)

## 2019-06-29 LAB — COMPREHENSIVE METABOLIC PANEL
ALT: 20 U/L (ref 0–44)
AST: 21 U/L (ref 15–41)
Albumin: 3.2 g/dL — ABNORMAL LOW (ref 3.5–5.0)
Alkaline Phosphatase: 62 U/L (ref 38–126)
Anion gap: 7 (ref 5–15)
BUN: 11 mg/dL (ref 8–23)
CO2: 27 mmol/L (ref 22–32)
Calcium: 8.5 mg/dL — ABNORMAL LOW (ref 8.9–10.3)
Chloride: 106 mmol/L (ref 98–111)
Creatinine, Ser: 0.98 mg/dL (ref 0.61–1.24)
GFR calc Af Amer: >60 mL/min (ref >60)
GFR calc non Af Amer: >60 mL/min (ref >60)
Glucose, Bld: 106 mg/dL — ABNORMAL HIGH (ref 70–99)
Potassium: 3.5 mmol/L (ref 3.5–5.1)
Sodium: 140 mmol/L (ref 135–145)
Total Bilirubin: 0.5 mg/dL (ref 0.3–1.2)
Total Protein: 6 g/dL — ABNORMAL LOW (ref 6.5–8.1)

## 2019-06-29 LAB — VITAMIN B12: Vitamin B-12: 385 pg/mL (ref 180–914)

## 2019-06-29 LAB — FOLATE: Folate: 21.7 ng/mL (ref >5.9)

## 2019-07-03 ENCOUNTER — Other Ambulatory Visit: Payer: Self-pay

## 2019-07-03 ENCOUNTER — Ambulatory Visit (HOSPITAL_COMMUNITY)
Admission: RE | Admit: 2019-07-03 | Discharge: 2019-07-03 | Disposition: A | Discharge status: Home / Self Care | Payer: Medicare Other | Source: Ambulatory Visit | Attending: Nurse Practitioner | Admitting: Nurse Practitioner

## 2019-07-03 ENCOUNTER — Inpatient Hospital Stay (HOSPITAL_BASED_OUTPATIENT_CLINIC_OR_DEPARTMENT_OTHER): Payer: Medicare Other | Admitting: Nurse Practitioner

## 2019-07-03 ENCOUNTER — Inpatient Hospital Stay (HOSPITAL_COMMUNITY): Payer: Medicare Other

## 2019-07-03 VITALS — BP 114/50 | HR 66 | Temp 97.1°F | Resp 18 | Wt 198.2 lb

## 2019-07-03 DIAGNOSIS — M545 Low back pain, unspecified: Secondary | ICD-10-CM

## 2019-07-03 DIAGNOSIS — C3491 Malignant neoplasm of unspecified part of right bronchus or lung: Secondary | ICD-10-CM

## 2019-07-03 DIAGNOSIS — E538 Deficiency of other specified B group vitamins: Secondary | ICD-10-CM

## 2019-07-03 MED ORDER — HEPARIN SOD (PORK) LOCK FLUSH 100 UNIT/ML IV SOLN
500.0000 [IU] | Freq: Once | INTRAVENOUS | Status: AC
  Administered 2019-07-03: 500 [IU] via INTRAVENOUS

## 2019-07-03 MED ORDER — CYANOCOBALAMIN 1000 MCG/ML IJ SOLN
INTRAMUSCULAR | Status: AC
  Filled 2019-07-03: qty 1

## 2019-07-03 MED ORDER — SODIUM CHLORIDE 0.9% FLUSH
10.0000 mL | INTRAVENOUS | Status: DC | PRN
  Administered 2019-07-03: 10 mL via INTRAVENOUS

## 2019-07-03 MED ORDER — CYANOCOBALAMIN 1000 MCG/ML IJ SOLN
1000.0000 ug | Freq: Once | INTRAMUSCULAR | Status: AC
  Administered 2019-07-03: 1000 ug via INTRAMUSCULAR

## 2019-07-03 NOTE — Progress Notes (Signed)
[name] Public tolerated Vit B12 injection and port flush well without complaints or incident. Port accessed with 20 gauge needle with blood return noted then flushed easily with 10 ml NS and 5 ml Heparin and de-accessed. Pt discharged via wheelchair in stable condition accompanied by his wife

## 2019-07-03 NOTE — Patient Instructions (Signed)
[facility] at [facility] Discharge Instructions  Follow up in 3 months with lab s   Thank you for choosing [facility] at [facility] to provide your oncology and hematology care.  To afford each patient quality time with our provider, please arrive at least 15 minutes before your scheduled appointment time.   If you have a lab appointment with the Cancer Center please come in thru the Main Entrance and check in at the main information desk.  You need to re-schedule your appointment should you arrive 10 or more minutes late.  We strive to give you quality time with our providers, and arriving late affects you and other patients whose appointments are after yours.  Also, if you no show three or more times for appointments you may be dismissed from the clinic at the providers discretion.     Again, thank you for choosing [facility].  Our hope is that these requests will decrease the amount of time that you wait before being seen by our physicians.       _____________________________________________________________  Should you have questions after your visit to [facility], please contact our office at [phone] between the hours of 8:00 a.m. and 4:30 p.m.  Voicemails left after 4:00 p.m. will not be returned until the following business day.  For prescription refill requests, have your pharmacy contact our office and allow 72 hours.    Due to Covid, you will need to wear a mask upon entering the hospital. If you do not have a mask, a mask will be given to you at the Main Entrance upon arrival. For doctor visits, patients may have 1 support person with them. For treatment visits, patients can not have anyone with them due to social distancing guidelines and our immunocompromised population.      

## 2019-07-03 NOTE — Progress Notes (Signed)
[name], [place] [address]   CLINIC:  Medical Oncology/Hematology  PCP:  Monico Blitz, [place] [address] [phone]   REASON FOR VISIT: Follow-up for lung cancer  CURRENT THERAPY: Observation  BRIEF ONCOLOGIC HISTORY:  Oncology History  Adenocarcinoma of right lung, stage 3 ([place])  11/10/2015 Imaging   13 mm spiculated lesion seen in right middle lobe.   11/30/2015 PET scan   Spiculated right middle lobe nodule is mildly hypermetabolic and most consistent with a stage IA adenocarcinoma.   12/30/2015 Surgery   Right middle lobectomy and node dissection   12/30/2015 Procedure   Right video-assisted thoracoscopy, Thoracoscopic right middle lobectomy, Mediastinal lymph node dissection, and On-Q local anesthetic catheter placement.   01/02/2016 Pathology Results   1. Lung, resection (segmental or lobe), Right Middle Lobe - INVASIVE ADENOCARCINOMA, WELL DIFFERENTIATED, SPANNING 1.2 CM. - THE SURGICAL RESECTION MARGINS ARE NEGATIVE FOR CARCINOMA. 2. Lymph node, biopsy, Level 12 - METASTATIC CARCINOMA IN 1 OF 1 LYMPH NODE (1/1). 3. Lymph node, biopsy, Level 12 #2 - METASTATIC CARCINOMA IN 1 OF 1 LYMPH NODE (1/1). 4. Lymph node, biopsy, Level 7 - METASTATIC CARCINOMA IN 1 OF 1 LYMPH NODE (1/1/). 5. Lymph node, biopsy, Level 7 #2 - METASTATIC CARCINOMA IN 1 OF 1 LYMPH NODE (1/1). 6. Lymph node, biopsy, Level 7 #3 - METASTATIC CARCINOMA IN 1 OF 1 LYMPH NODE (1/1). 7. Lymph node, biopsy, Level 7 #4 - METASTATIC CARCINOMA IN 1 OF 1 LYMPH NODE (1/1). 8. Lymph node, biopsy, 4R - THERE IS NO EVIDENCE OF CARCINOMA IN 1 OF 1 LYMPH NODE (0/1). 9. Lymph node, biopsy, 4R #2 - THERE IS NO EVIDENCE OF CARCINOMA IN 1 OF 1 LYMPH NODE (0/1).   01/05/2016 Pathology Results   PDL1 NEGATIVE- tumor proportion score of 0%.    01/05/2016 Pathology Results   Genomic alterations identified: BRAF V600E, KIT amplification, PDGFRA amplification,  CDKN2A/B loss, TP53 S72f*33.  Additional findings: MSI-STABLE.  No reportable alterations identified: EGFR, KRAS, ALK, MET, RET, ERBB2, ROS1   02/09/2016 Procedure   Port placed by IR.   02/16/2016 - 05/31/2016 Chemotherapy   Carboplatin/Pemetrexed x 6 cycles     - 08/01/2016 Radiation Therapy   [place], [place]   08/28/2016 Imaging   CT CAP- No evidence of recurrent or metastatic carcinoma within the chest, abdomen, or pelvis.  Stable incidental findings include a absent, small benign right adrenal adenoma, sigmoid diverticulosis, and aortic atherosclerosis.   09/04/2016 -  Chemotherapy   Imfinzi immunotherapy up to 52 weeks.    12/07/2016 Imaging   CT C/A/P: New focal streaky opacities within the peripheral posterior right lower lobe and anteromedial left upper lobe- likely atelectasis with infection or other process is less likely. Recommend attention to these areas on future scans.  Small right pleural effusion, slightly increased from 08/28/2016. No evidence of pleural mass or enhancement.  No evidence of malignancy/ metastatic disease within the abdomen or pelvis.    02/12/2017 Imaging   CT chest: IMPRESSION: 1. Stable CT of the chest. No specific findings identified to suggest residual or recurrence of tumor. 2. Persistent right pleural effusion. 3. Paramediastinal radiation change predominantly involving the right lung is similar to previous study. 4. Stable right adrenal gland nodule and low-density foci within the liver. 5. Aortic Atherosclerosis (ICD10-I70.0) and Emphysema (ICD10-J43.9). Multi vessel coronary artery calcifications noted.      CANCER STAGING: Cancer Staging Adenocarcinoma of right lung, stage 3 (HCC) Staging  form: Lung, AJCC 7th Edition - Clinical stage from 12/09/2015: Stage IA (T1a, N0, M0) - Signed by Baird Cancer, PA-C on 12/09/2015 - Pathologic stage from 01/20/2016: Stage IIIA (T1a, N2, cM0) - Signed by Baird Cancer, PA-C on  02/01/2016    INTERVAL HISTORY:  [name] 86 y.o. male returns for routine follow-up for lung cancer.  Patient report he has been doing well since his last visit.  He denies any productive cough.  Denies any blood in his sputum.  Denies any chest pain.  Patient only complains of lower back pain thought to be his sciatic nerve. Denies any nausea, vomiting, or diarrhea. Denies any new pains. Had not noticed any recent bleeding such as epistaxis, hematuria or hematochezia. Denies recent chest pain on exertion, shortness of breath on minimal exertion, pre-syncopal episodes, or palpitations. Denies any numbness or tingling in hands or feet. Denies any recent fevers, infections, or recent hospitalizations. Patient reports appetite at 100% and energy level at 75%.  He is eating well maintained his weight at this time.     REVIEW OF SYSTEMS:  Review of Systems  Respiratory: Positive for shortness of breath.   Musculoskeletal: Positive for back pain.  Neurological: Positive for numbness.  Psychiatric/Behavioral: Positive for sleep disturbance.  All other systems reviewed and are negative.    PAST MEDICAL/SURGICAL HISTORY:  Past Medical History:  Diagnosis Date  . Adenocarcinoma of lung, stage 3 (HCC)    Stage IIIA  . Adenocarcinoma of right lung, stage 3 ([facility]) 12/02/2015  . Arthritis   . Atrial fibrillation, transient ([place])    transient postop, < 24 hours  . B12 deficiency   . Cyst of scrotum   . Diverticulitis   . GERD (gastroesophageal reflux disease)   . Hernia   . History of pneumonia   . Hyperlipidemia   . Lung nodule    Spiculated right middle lobe nodule, hypermetabolic  . Stroke ([facility])   . Type 2 diabetes mellitus ([place])    Past Surgical History:  Procedure Laterality Date  . BACK SURGERY    . CHOLECYSTECTOMY    . COLONOSCOPY N/A 11/24/2015   Procedure: COLONOSCOPY;  Surgeon: Rogene Houston, MD;  Location: [facility];  Service: Endoscopy;  Laterality: N/A;  730  .  COLONOSCOPY W/ POLYPECTOMY     x 5  . CYST EXCISION     Scrotum  . EYE SURGERY Bilateral    Cataract with Lens  . HERNIA REPAIR Right    Inguinal  . IR GENERIC HISTORICAL  02/09/2016   IR US GUIDE VASC ACCESS RIGHT 02/09/2016 Arne Cleveland, MD [facility]  . IR GENERIC HISTORICAL  02/09/2016   IR FLUORO GUIDE CV LINE RIGHT 02/09/2016 Arne Cleveland, MD [facility]  . LUMBAR DISC SURGERY     per patient report  . PORTACATH PLACEMENT    . VIDEO ASSISTED THORACOSCOPY (VATS)/ LOBECTOMY Right 12/30/2015   Procedure: VIDEO ASSISTED THORACOSCOPY (VATS)/RIGHT MIDDLE LOBECTOMY;  Surgeon: Melrose Nakayama, MD;  Location: [facility];  Service: Thoracic;  Laterality: Right;     SOCIAL HISTORY:  Social History   Socioeconomic History  . Marital status: Married    Spouse name: Not on file  . Number of children: Not on file  . Years of education: Not on file  . Highest education level: Not on file  Occupational History  . Not on file  Tobacco Use  . Smoking status: Former Smoker    Types: Cigarettes  Quit date: 05/27/2004    Years since quitting: 15.1  . Smokeless tobacco: Never Used  Substance and Sexual Activity  . Alcohol use: No    Alcohol/week: 0.0 standard drinks  . Drug use: No  . Sexual activity: Not on file    Comment: married  Other Topics Concern  . Not on file  Social History Narrative  . Not on file   Social Determinants of Health   Financial Resource Strain:   . Difficulty of Paying Living Expenses: Not on file  Food Insecurity:   . Worried About Charity fundraiser in the Last Year: Not on file  . Ran Out of Food in the Last Year: Not on file  Transportation Needs:   . Lack of Transportation (Medical): Not on file  . Lack of Transportation (Non-Medical): Not on file  Physical Activity:   . Days of Exercise per Week: Not on file  . Minutes of Exercise per Session: Not on file  Stress:   . Feeling of Stress : Not on file  Social Connections:   .  Frequency of Communication with Friends and Family: Not on file  . Frequency of Social Gatherings with Friends and Family: Not on file  . Attends Religious Services: Not on file  . Active Member of Clubs or Organizations: Not on file  . Attends Archivist Meetings: Not on file  . Marital Status: Not on file  Intimate Partner Violence:   . Fear of Current or Ex-Partner: Not on file  . Emotionally Abused: Not on file  . Physically Abused: Not on file  . Sexually Abused: Not on file    FAMILY HISTORY:  Family History  Problem Relation Age of Onset  . Colon cancer Brother     CURRENT MEDICATIONS:  Outpatient Encounter Medications as of 07/03/2019  Medication Sig  . ACCU-CHEK AVIVA PLUS test strip USE 5 STRIPS DAILY TO MONITOR FLUCTUATING BLOOD SUGAR  . ACCU-CHEK FASTCLIX LANCETS MISC USE 6 LANCETS DAILY  . ALPRAZolam (XANAX) 0.5 MG tablet Take 0.5 mg by mouth daily.  Marland Kitchen aspirin EC 81 MG tablet Take 81 mg by mouth daily.  . calcium carbonate (OS-CAL - DOSED IN MG OF ELEMENTAL CALCIUM) 1250 (500 Ca) MG tablet Take 1 tablet (500 mg of elemental calcium total) by mouth daily with breakfast.  . Cholecalciferol (VITAMIN D) 2000 UNITS CAPS Take 1 capsule by mouth daily.   . Cyanocobalamin (VITAMIN B-12 IJ) Inject 1 Dose as directed every 30 (thirty) days.  Marland Kitchen glipiZIDE (GLUCOTROL) 5 MG tablet Take 1 tablet (5 mg total) by mouth 2 (two) times daily before a meal. (Patient taking differently: Take 2.5 mg by mouth 2 (two) times daily before a meal. )  . lisinopril (PRINIVIL,ZESTRIL) 40 MG tablet Take 1 tablet (40 mg total) by mouth daily.  Marland Kitchen loratadine (CLARITIN) 10 MG tablet Take 10 mg by mouth daily.  . meclizine (ANTIVERT) 25 MG tablet Take 25 mg by mouth 3 (three) times daily.   . pantoprazole (PROTONIX) 40 MG tablet Take 40 mg by mouth 2 (two) times daily.   . potassium chloride SA (K-DUR,KLOR-CON) 20 MEQ tablet Take 1 tablet (20 mEq total) by mouth 2 (two) times daily.  .  simvastatin (ZOCOR) 40 MG tablet Take 40 mg by mouth at bedtime.   Marland Kitchen terazosin (HYTRIN) 5 MG capsule Take 5 mg by mouth at bedtime.  Marland Kitchen tiZANidine (ZANAFLEX) 4 MG tablet Take 4 mg by mouth at bedtime.  . lidocaine-prilocaine (EMLA)  cream Apply a quarter size amount to port site 1 hour prior to chemo. Do not rub in. Cover with plastic wrap. (Patient not taking: Reported on 07/03/2019)  . ondansetron (ZOFRAN) 8 MG tablet Take 1 tablet (8 mg total) by mouth every 8 (eight) hours as needed for nausea or vomiting. (Patient not taking: Reported on 07/03/2019)  . prochlorperazine (COMPAZINE) 10 MG tablet TAKE ONE TABLET BY MOUTH EVERY 6 HOURS AS NEEDED FOR NAUSEA AND VOMITING (Patient not taking: Reported on 07/03/2019)  . zolpidem (AMBIEN) 10 MG tablet Take 0.5 tablets (5 mg total) by mouth at bedtime as needed for sleep. For sleep (Patient not taking: Reported on 07/03/2019)   Facility-Administered Encounter Medications as of 07/03/2019  Medication  . cyanocobalamin ((VITAMIN B-12)) injection 1,000 mcg  . heparin lock flush 100 unit/mL  . sodium chloride flush (NS) 0.9 % injection 10 mL    ALLERGIES:  Allergies  Allergen Reactions  . Morphine And Related Other (See Comments)    confusion     PHYSICAL EXAM:  ECOG Performance status: 1  Vitals:   07/03/19 1142  BP: (!) 114/50  Pulse: 66  Resp: 18  Temp: (!) 97.1 F (36.2 C)  SpO2: 96%   Filed Weights   07/03/19 1142  Weight: 198 lb 3.2 oz (89.9 kg)    Physical Exam Constitutional:      Appearance: Normal appearance. He is normal weight.  Cardiovascular:     Rate and Rhythm: Normal rate and regular rhythm.     Heart sounds: Normal heart sounds.  Pulmonary:     Effort: Pulmonary effort is normal.     Breath sounds: Normal breath sounds.  Abdominal:     General: Bowel sounds are normal.     Palpations: Abdomen is soft.  Musculoskeletal:        General: Normal range of motion.  Skin:    General: Skin is warm.  Neurological:      Mental Status: He is alert and oriented to person, place, and time. Mental status is at baseline.  Psychiatric:        Mood and Affect: Mood normal.        Behavior: Behavior normal.        Thought Content: Thought content normal.        Judgment: Judgment normal.      LABORATORY DATA:  I have reviewed the labs as listed.  CBC    Component Value Date/Time   WBC 7.8 06/29/2019 1124   RBC 4.20 (L) 06/29/2019 1124   HGB 12.7 (L) 06/29/2019 1124   HCT 39.0 06/29/2019 1124   PLT 246 06/29/2019 1124   MCV 92.9 06/29/2019 1124   MCH 30.2 06/29/2019 1124   MCHC 32.6 06/29/2019 1124   RDW 12.7 06/29/2019 1124   LYMPHSABS 1.6 06/29/2019 1124   MONOABS 0.7 06/29/2019 1124   EOSABS 0.4 06/29/2019 1124   BASOSABS 0.1 06/29/2019 1124   CMP Latest Ref Rng & Units 06/29/2019 03/26/2019 11/21/2018  Glucose 70 - 99 mg/dL 106(H) 165(H) 99  BUN 8 - 23 mg/dL '11 13 11  ' Creatinine 0.61 - 1.24 mg/dL 0.98 1.06 1.13  Sodium 135 - 145 mmol/L 140 133(L) 140  Potassium 3.5 - 5.1 mmol/L 3.5 3.7 4.2  Chloride 98 - 111 mmol/L 106 103 104  CO2 22 - 32 mmol/L '27 25 28  ' Calcium 8.9 - 10.3 mg/dL 8.5(L) 8.3(L) 9.1  Total Protein 6.5 - 8.1 g/dL 6.0(L) 6.2(L) 6.6  Total Bilirubin 0.3 - 1.2  mg/dL 0.5 0.6 0.6  Alkaline Phos 38 - 126 U/L 62 61 67  AST 15 - 41 U/L '21 23 22  ' ALT 0 - 44 U/L '20 19 20     ' DIAGNOSTIC IMAGING:  I have independently reviewed the CT scans and discussed with the patient.    I personally performed a face-to-face visit.  All questions were answered to patient's stated satisfaction. Encouraged patient to call with any new concerns or questions before his next visit to the cancer center and we can certain see him sooner, if needed.      ASSESSMENT & PLAN:   Adenocarcinoma of right lung, stage 3 (HCC) 1.  Stage IIIa right lung adenocarcinoma: - Diagnosed in June 2017, status post right below lobectomy, VATS with mediastinal node sampling, PDL 1 negative. - Status post carboplatin  and pemetrexed for 6 cycles completed on 05/31/2016. -Radiation therapy completed 08/01/2016. - Consolidation Durvalumab from 09/04/2016 through 08/28/2017. -CT scan on 12/18/2017 showed small right pleural effusions, mildly increased, no evidence of recurrent or metastatic disease. - MRI of the brain on 12/18/2017 did not show any evidence of metastatic disease.  Showed chronic right cerebellar infarct which was small. -CT chest dated on 05/20/2018 showed stable perihilar radiation changes in the right lung.  Stable small right pleural effusions with volume loss.  No evidence of lung cancer recurrence. -His breathing is stable to mildly improved.  Denies any changes in cough or hemoptysis. -Recent CT of the chest dated 11/21/2018 no findings to suggest recurrent tumor or metastatic disease.  5 mm left lower lobe lung nodule is stable from previous study.  Unchanged small right pleural effusion. -Repeat CT scan on 06/29/2019 showed slight interval increase in small right pleural effusion.  Otherwise stable exam.  No change 5 mm left lower lobe pulmonary nodule. -Labs done on 06/29/2019 showed hemoglobin 12.7, platelets 246, WBC 7.1, creatinine 0.98. -We will see him back in 3 months with repeat blood work.  2.  Iron deficiency anemia: - Last received Feraheme on 05/30/2018 and 06/06/2018. - Labs on labs on 06/29/2019 showed hemoglobin 12.7, platelets 246, ferritin 115, percent saturation 22, creatinine 0.98 -His TSH was repeated and was increased to 6.313 from 5.019.  Patient did not want to start on Synthroid at this time wishes to recheck it in 3 months. -We will repeat labs in 3 months with repeat labs.  3.  Dizziness: - He reports he has been dizzy for 3 months with occasional falls. - [facility] which he says [facility] slightly. -Brain MRI done on 04/07/2019 was negative for any metastatic disease. -Patient reports the Antivert he is taking 3 times a day is helping his  dizziness.  4.  Back pain/sciatic nerve pain: -Patient saw PCP yesterday he ordered a lumbar x-ray.  Patient wanted it done at [facility]. -I placed orders for the lumbar spine x-ray to be done per PCP. -Patient will follow up with PCP      Orders placed this encounter:  Orders Placed This Encounter  Procedures  . DG Lumbar Spine Complete  . Lactate dehydrogenase  . CBC with Differential/Platelet  . Comprehensive metabolic panel  . Ferritin  . Iron and TIBC  . Vitamin B12  . VITAMIN D 25 Hydroxy (Vit-D Deficiency, Fractures)  . TSH  . Folate      Francene Finders, FNP-C [place] [phone]

## 2019-07-03 NOTE — Assessment & Plan Note (Addendum)
1.  Stage IIIa right lung adenocarcinoma: - Diagnosed in June 2017, status post right below lobectomy, VATS with mediastinal node sampling, PDL 1 negative. - Status post carboplatin and pemetrexed for 6 cycles completed on 05/31/2016. -Radiation therapy completed 08/01/2016. - Consolidation Durvalumab from 09/04/2016 through 08/28/2017. -CT scan on 12/18/2017 showed small right pleural effusions, mildly increased, no evidence of recurrent or metastatic disease. - MRI of the brain on 12/18/2017 did not show any evidence of metastatic disease.  Showed chronic right cerebellar infarct which was small. -CT chest dated on 05/20/2018 showed stable perihilar radiation changes in the right lung.  Stable small right pleural effusions with volume loss.  No evidence of lung cancer recurrence. -His breathing is stable to mildly improved.  Denies any changes in cough or hemoptysis. -Recent CT of the chest dated 11/21/2018 no findings to suggest recurrent tumor or metastatic disease.  5 mm left lower lobe lung nodule is stable from previous study.  Unchanged small right pleural effusion. -Repeat CT scan on 06/29/2019 showed slight interval increase in small right pleural effusion.  Otherwise stable exam.  No change 5 mm left lower lobe pulmonary nodule. -Labs done on 06/29/2019 showed hemoglobin 12.7, platelets 246, WBC 7.1, creatinine 0.98. -We will see him back in 3 months with repeat blood work.  2.  Iron deficiency anemia: - Last received Feraheme on 05/30/2018 and 06/06/2018. - Labs on labs on 06/29/2019 showed hemoglobin 12.7, platelets 246, ferritin 115, percent saturation 22, creatinine 0.98 -His TSH was repeated and was increased to 6.313 from 5.019.  Patient did not want to start on Synthroid at this time wishes to recheck it in 3 months. -We will repeat labs in 3 months with repeat labs.  3.  Dizziness: - He reports he has been dizzy for 3 months with occasional falls. - [facility]  which he says helped slightly. -Brain MRI done on 04/07/2019 was negative for any metastatic disease. -Patient reports the Antivert he is taking 3 times a day is helping his dizziness.  4.  Back pain/sciatic nerve pain: -Patient saw PCP yesterday he ordered a lumbar x-ray.  Patient wanted it done at [facility]. -I placed orders for the lumbar spine x-ray to be done per PCP. -Patient will follow up with PCP

## 2019-07-03 NOTE — Patient Instructions (Signed)
[place] at [facility] Discharge Instructions  Portacath flushed per protocol and Vit B12injection given today. Follow-up as scheduled. Call clinic for any questions or concerns   Thank you for choosing [place] at [facility] to provide your oncology and hematology care.  To afford each patient quality time with our provider, please arrive at least 15 minutes before your scheduled appointment time.   If you have a lab appointment with the [facility] please come in thru the Main Entrance and check in at the main information desk.  You need to re-schedule your appointment should you arrive 10 or more minutes late.  We strive to give you quality time with our providers, and arriving late affects you and other patients whose appointments are after yours.  Also, if you no show three or more times for appointments you may be dismissed from the clinic at the providers discretion.     Again, thank you for choosing [facility].  Our hope is that these requests will decrease the amount of time that you wait before being seen by our physicians.       _____________________________________________________________  Should you have questions after your visit to [facility], please contact our office at [phone] between the hours of 8:00 a.m. and 4:30 p.m.  Voicemails left after 4:00 p.m. will not be returned until the following business day.  For prescription refill requests, have your pharmacy contact our office and allow 72 hours.    Due to Covid, you will need to wear a mask upon entering the hospital. If you do not have a mask, a mask will be given to you at the Main Entrance upon arrival. For doctor visits, patients may have 1 support person with them. For treatment visits, patients can not have anyone with them due to social distancing guidelines and our immunocompromised population.

## 2019-08-03 ENCOUNTER — Inpatient Hospital Stay (HOSPITAL_COMMUNITY): Payer: Medicare Other | Attending: Hematology

## 2019-08-03 ENCOUNTER — Encounter (HOSPITAL_COMMUNITY): Payer: Self-pay

## 2019-08-03 ENCOUNTER — Other Ambulatory Visit: Payer: Self-pay

## 2019-08-03 VITALS — BP 105/57 | HR 78 | Temp 97.1°F | Resp 18

## 2019-08-03 DIAGNOSIS — E538 Deficiency of other specified B group vitamins: Secondary | ICD-10-CM | POA: Insufficient documentation

## 2019-08-03 MED ORDER — CYANOCOBALAMIN 1000 MCG/ML IJ SOLN
1000.0000 ug | Freq: Once | INTRAMUSCULAR | Status: AC
  Administered 2019-08-03: 1000 ug via INTRAMUSCULAR

## 2019-08-03 MED ORDER — CYANOCOBALAMIN 1000 MCG/ML IJ SOLN
INTRAMUSCULAR | Status: AC
  Filled 2019-08-03: qty 1

## 2019-08-03 NOTE — Progress Notes (Signed)
[name] tolerated Vit B12 injection well without complaints or incident. VSS Pt discharged via wheelchair in satisfactory condition accompanied by his wife

## 2019-08-03 NOTE — Patient Instructions (Signed)
[facility] at [facility] Discharge Instructions  Received Vit B12 injection today. Follow-up as scheduled. Call clinic for any questions or concerns   Thank you for choosing [facility] at [facility] to provide your oncology and hematology care.  To afford each patient quality time with our provider, please arrive at least 15 minutes before your scheduled appointment time.   If you have a lab appointment with the Cancer Center please come in thru the Main Entrance and check in at the main information desk.  You need to re-schedule your appointment should you arrive 10 or more minutes late.  We strive to give you quality time with our providers, and arriving late affects you and other patients whose appointments are after yours.  Also, if you no show three or more times for appointments you may be dismissed from the clinic at the providers discretion.     Again, thank you for choosing [facility].  Our hope is that these requests will decrease the amount of time that you wait before being seen by our physicians.       _____________________________________________________________  Should you have questions after your visit to [facility], please contact our office at [phone] between the hours of 8:00 a.m. and 4:30 p.m.  Voicemails left after 4:00 p.m. will not be returned until the following business day.  For prescription refill requests, have your pharmacy contact our office and allow 72 hours.    Due to Covid, you will need to wear a mask upon entering the hospital. If you do not have a mask, a mask will be given to you at the Main Entrance upon arrival. For doctor visits, patients may have 1 support person with them. For treatment visits, patients can not have anyone with them due to social distancing guidelines and our immunocompromised population.     

## 2019-08-31 ENCOUNTER — Encounter (HOSPITAL_COMMUNITY): Payer: Self-pay

## 2019-08-31 ENCOUNTER — Other Ambulatory Visit: Payer: Self-pay

## 2019-08-31 ENCOUNTER — Inpatient Hospital Stay (HOSPITAL_COMMUNITY): Payer: Medicare Other | Attending: Hematology

## 2019-08-31 VITALS — BP 123/63 | HR 88 | Temp 97.1°F | Resp 18

## 2019-08-31 DIAGNOSIS — E538 Deficiency of other specified B group vitamins: Secondary | ICD-10-CM | POA: Diagnosis present

## 2019-08-31 MED ORDER — CYANOCOBALAMIN 1000 MCG/ML IJ SOLN
1000.0000 ug | Freq: Once | INTRAMUSCULAR | Status: AC
  Administered 2019-08-31: 1000 ug via INTRAMUSCULAR

## 2019-08-31 NOTE — Patient Instructions (Signed)
[facility] at [facility] Discharge Instructions  Received Vit B12 injection today. Follow-up as scheduled. Call clinic for any questions or concerns   Thank you for choosing [facility] at [facility] to provide your oncology and hematology care.  To afford each patient quality time with our provider, please arrive at least 15 minutes before your scheduled appointment time.   If you have a lab appointment with the Cancer Center please come in thru the Main Entrance and check in at the main information desk.  You need to re-schedule your appointment should you arrive 10 or more minutes late.  We strive to give you quality time with our providers, and arriving late affects you and other patients whose appointments are after yours.  Also, if you no show three or more times for appointments you may be dismissed from the clinic at the providers discretion.     Again, thank you for choosing [facility].  Our hope is that these requests will decrease the amount of time that you wait before being seen by our physicians.       _____________________________________________________________  Should you have questions after your visit to [facility], please contact our office at [phone] between the hours of 8:00 a.m. and 4:30 p.m.  Voicemails left after 4:00 p.m. will not be returned until the following business day.  For prescription refill requests, have your pharmacy contact our office and allow 72 hours.    Due to Covid, you will need to wear a mask upon entering the hospital. If you do not have a mask, a mask will be given to you at the Main Entrance upon arrival. For doctor visits, patients may have 1 support person with them. For treatment visits, patients can not have anyone with them due to social distancing guidelines and our immunocompromised population.     

## 2019-08-31 NOTE — Progress Notes (Signed)
[name] tolerated Vit B12 injection well without complaints or incident. VSS Pt discharged via wheelchair in satisfactory condition accompanied by his wife

## 2019-09-24 ENCOUNTER — Other Ambulatory Visit (HOSPITAL_COMMUNITY): Payer: Medicare Other

## 2019-09-24 ENCOUNTER — Other Ambulatory Visit: Payer: Self-pay

## 2019-09-24 ENCOUNTER — Inpatient Hospital Stay (HOSPITAL_COMMUNITY): Payer: Medicare Other | Attending: Hematology

## 2019-09-24 DIAGNOSIS — C3491 Malignant neoplasm of unspecified part of right bronchus or lung: Secondary | ICD-10-CM

## 2019-09-24 DIAGNOSIS — Z79899 Other long term (current) drug therapy: Secondary | ICD-10-CM | POA: Insufficient documentation

## 2019-09-24 DIAGNOSIS — E538 Deficiency of other specified B group vitamins: Secondary | ICD-10-CM | POA: Diagnosis not present

## 2019-09-24 DIAGNOSIS — C342 Malignant neoplasm of middle lobe, bronchus or lung: Secondary | ICD-10-CM | POA: Insufficient documentation

## 2019-09-24 LAB — COMPREHENSIVE METABOLIC PANEL
ALT: 20 U/L (ref 0–44)
AST: 22 U/L (ref 15–41)
Albumin: 3.3 g/dL — ABNORMAL LOW (ref 3.5–5.0)
Alkaline Phosphatase: 62 U/L (ref 38–126)
Anion gap: 9 (ref 5–15)
BUN: 11 mg/dL (ref 8–23)
CO2: 26 mmol/L (ref 22–32)
Calcium: 8.9 mg/dL (ref 8.9–10.3)
Chloride: 101 mmol/L (ref 98–111)
Creatinine, Ser: 1.06 mg/dL (ref 0.61–1.24)
GFR calc Af Amer: >60 mL/min (ref >60)
GFR calc non Af Amer: >60 mL/min (ref >60)
Glucose, Bld: 240 mg/dL — ABNORMAL HIGH (ref 70–99)
Potassium: 3.9 mmol/L (ref 3.5–5.1)
Sodium: 136 mmol/L (ref 135–145)
Total Bilirubin: 0.7 mg/dL (ref 0.3–1.2)
Total Protein: 6.2 g/dL — ABNORMAL LOW (ref 6.5–8.1)

## 2019-09-24 LAB — IRON AND TIBC
Iron: 92 ug/dL (ref 45–182)
Saturation Ratios: 33 % (ref 17.9–39.5)
TIBC: 280 ug/dL (ref 250–450)
UIBC: 188 ug/dL

## 2019-09-24 LAB — CBC WITH DIFFERENTIAL/PLATELET
Abs Immature Granulocytes: 0.03 10*3/uL (ref 0.00–0.07)
Basophils Absolute: 0 10*3/uL (ref 0.0–0.1)
Basophils Relative: 1 %
Eosinophils Absolute: 0.4 10*3/uL (ref 0.0–0.5)
Eosinophils Relative: 6 %
HCT: 40.2 % (ref 39.0–52.0)
Hemoglobin: 13.1 g/dL (ref 13.0–17.0)
Immature Granulocytes: 0 %
Lymphocytes Relative: 22 %
Lymphs Abs: 1.6 10*3/uL (ref 0.7–4.0)
MCH: 30 pg (ref 26.0–34.0)
MCHC: 32.6 g/dL (ref 30.0–36.0)
MCV: 92 fL (ref 80.0–100.0)
Monocytes Absolute: 0.6 10*3/uL (ref 0.1–1.0)
Monocytes Relative: 8 %
Neutro Abs: 4.6 10*3/uL (ref 1.7–7.7)
Neutrophils Relative %: 63 %
Platelets: 255 10*3/uL (ref 150–400)
RBC: 4.37 MIL/uL (ref 4.22–5.81)
RDW: 12.6 % (ref 11.5–15.5)
WBC: 7.3 10*3/uL (ref 4.0–10.5)
nRBC: 0 % (ref 0.0–0.2)

## 2019-09-24 LAB — LACTATE DEHYDROGENASE: LDH: 121 U/L (ref 98–192)

## 2019-09-24 LAB — VITAMIN D 25 HYDROXY (VIT D DEFICIENCY, FRACTURES): Vit D, 25-Hydroxy: 46.15 ng/mL (ref 30–100)

## 2019-09-24 LAB — FOLATE: Folate: 19.6 ng/mL (ref >5.9)

## 2019-09-24 LAB — VITAMIN B12: Vitamin B-12: 450 pg/mL (ref 180–914)

## 2019-09-24 LAB — FERRITIN: Ferritin: 114 ng/mL (ref 24–336)

## 2019-09-24 LAB — TSH: TSH: 4.119 u[IU]/mL (ref 0.350–4.500)

## 2019-10-01 ENCOUNTER — Other Ambulatory Visit: Payer: Self-pay

## 2019-10-01 ENCOUNTER — Inpatient Hospital Stay (HOSPITAL_COMMUNITY): Payer: Medicare Other

## 2019-10-01 ENCOUNTER — Inpatient Hospital Stay (HOSPITAL_BASED_OUTPATIENT_CLINIC_OR_DEPARTMENT_OTHER): Payer: Medicare Other | Admitting: Hematology

## 2019-10-01 ENCOUNTER — Encounter (HOSPITAL_COMMUNITY): Payer: Self-pay | Admitting: Hematology

## 2019-10-01 VITALS — BP 118/56 | HR 76 | Temp 96.8°F | Resp 16 | Wt 212.4 lb

## 2019-10-01 DIAGNOSIS — E538 Deficiency of other specified B group vitamins: Secondary | ICD-10-CM

## 2019-10-01 DIAGNOSIS — C3491 Malignant neoplasm of unspecified part of right bronchus or lung: Secondary | ICD-10-CM | POA: Diagnosis not present

## 2019-10-01 DIAGNOSIS — C342 Malignant neoplasm of middle lobe, bronchus or lung: Secondary | ICD-10-CM | POA: Diagnosis not present

## 2019-10-01 MED ORDER — SODIUM CHLORIDE 0.9% FLUSH
10.0000 mL | INTRAVENOUS | Status: DC | PRN
  Administered 2019-10-01: 10 mL via INTRAVENOUS

## 2019-10-01 MED ORDER — HEPARIN SOD (PORK) LOCK FLUSH 100 UNIT/ML IV SOLN
500.0000 [IU] | Freq: Once | INTRAVENOUS | Status: AC
  Administered 2019-10-01: 500 [IU] via INTRAVENOUS

## 2019-10-01 MED ORDER — CYANOCOBALAMIN 1000 MCG/ML IJ SOLN
1000.0000 ug | Freq: Once | INTRAMUSCULAR | Status: AC
  Administered 2019-10-01: 1000 ug via INTRAMUSCULAR

## 2019-10-01 NOTE — Patient Instructions (Signed)
[place] at [facility] Discharge Instructions  Portacath flushed per protocol today and Vit B12 injection given. Follow-up as scheduled. Call clinic for any questions or concerns   Thank you for choosing [place] at [facility] to provide your oncology and hematology care.  To afford each patient quality time with our provider, please arrive at least 15 minutes before your scheduled appointment time.   If you have a lab appointment with the [place] please come in thru the Main Entrance and check in at the main information desk.  You need to re-schedule your appointment should you arrive 10 or more minutes late.  We strive to give you quality time with our providers, and arriving late affects you and other patients whose appointments are after yours.  Also, if you no show three or more times for appointments you may be dismissed from the clinic at the providers discretion.     Again, thank you for choosing [facility].  Our hope is that these requests will decrease the amount of time that you wait before being seen by our physicians.       _____________________________________________________________  Should you have questions after your visit to [facility], please contact our office at [phone] between the hours of 8:00 a.m. and 4:30 p.m.  Voicemails left after 4:00 p.m. will not be returned until the following business day.  For prescription refill requests, have your pharmacy contact our office and allow 72 hours.    Due to Covid, you will need to wear a mask upon entering the hospital. If you do not have a mask, a mask will be given to you at the Main Entrance upon arrival. For doctor visits, patients may have 1 support person with them. For treatment visits, patients can not have anyone with them due to social distancing guidelines and our immunocompromised population.

## 2019-10-01 NOTE — Assessment & Plan Note (Signed)
1.  Stage III right lung adenocarcinoma: -Diagnosed in June 2017, status post right lower lobectomy, VATS with mediastinal node sampling, PD-L1 negative. -6 cycles of carboplatin and pemetrexed completed on 05/31/2016, XRT completed on 08/01/2016. -Consolidation durvalumab from 09/04/2016 through 08/28/2017. -We reviewed CT chest without contrast from 06/29/2019 which showed slight interval increase in small right pleural effusion, otherwise no evidence of recurrence or metastatic disease.  No change in the 5 mm left lower lobe pulmonary nodule. -I plan to repeat scan in 2 to 3 months.  He does not have any clinical signs or symptoms of recurrence at this time.  2.  Iron deficiency anemia: -Last Feraheme infusion was on 06/06/2018. -We have reviewed labs from 09/24/2019.  Hemoglobin and ferritin were normal.  TSH was normal.  3.  B12 deficiency: -His B12 level was 450.  He will continue B12 monthly injections.

## 2019-10-01 NOTE — Progress Notes (Signed)
[name] Public tolerated Vit B12 injection and portacath flush well without complaints or incident. Port accessed with 20 gauge needle with blood return noted and flushed easily per protocol then de-accessed. Pt discharged via wheelchair in satisfactory condition accompanied by his wife

## 2019-10-01 NOTE — Patient Instructions (Addendum)
[place] at [facility] Discharge Instructions  You were seen today by Dr. Delton Coombes. He went over your recent lab results. He will see you back in 3 months for labs, scan and follow up.   Thank you for choosing [place] at [facility] to provide your oncology and hematology care.  To afford each patient quality time with our provider, please arrive at least 15 minutes before your scheduled appointment time.   If you have a lab appointment with the [facility] please come in thru the  Main Entrance and check in at the main information desk  You need to re-schedule your appointment should you arrive 10 or more minutes late.  We strive to give you quality time with our providers, and arriving late affects you and other patients whose appointments are after yours.  Also, if you no show three or more times for appointments you may be dismissed from the clinic at the providers discretion.     Again, thank you for choosing [facility].  Our hope is that these requests will decrease the amount of time that you wait before being seen by our physicians.       _____________________________________________________________  Should you have questions after your visit to [facility], please contact our office at [phone] between the hours of 8:00 a.m. and 4:30 p.m.  Voicemails left after 4:00 p.m. will not be returned until the following business day.  For prescription refill requests, have your pharmacy contact our office and allow 72 hours.    Cancer Center Support Programs:   > Cancer Support Group  2nd Tuesday of the month 1pm-2pm, Journey Room

## 2019-10-01 NOTE — Progress Notes (Signed)
[place] [place], [place] [address]   CLINIC:  Medical Oncology/Hematology  PCP:  Monico Blitz, [place] [address] [phone]   REASON FOR VISIT: Follow-up for lung cancer  CURRENT THERAPY: Observation  BRIEF ONCOLOGIC HISTORY:  Oncology History  Adenocarcinoma of right lung, stage 3 ([place])  11/10/2015 Imaging   13 mm spiculated lesion seen in right middle lobe.   11/30/2015 PET scan   Spiculated right middle lobe nodule is mildly hypermetabolic and most consistent with a stage IA adenocarcinoma.   12/30/2015 Surgery   Right middle lobectomy and node dissection   12/30/2015 Procedure   Right video-assisted thoracoscopy, Thoracoscopic right middle lobectomy, Mediastinal lymph node dissection, and On-Q local anesthetic catheter placement.   01/02/2016 Pathology Results   1. Lung, resection (segmental or lobe), Right Middle Lobe - INVASIVE ADENOCARCINOMA, WELL DIFFERENTIATED, SPANNING 1.2 CM. - THE SURGICAL RESECTION MARGINS ARE NEGATIVE FOR CARCINOMA. 2. Lymph node, biopsy, Level 12 - METASTATIC CARCINOMA IN 1 OF 1 LYMPH NODE (1/1). 3. Lymph node, biopsy, Level 12 #2 - METASTATIC CARCINOMA IN 1 OF 1 LYMPH NODE (1/1). 4. Lymph node, biopsy, Level 7 - METASTATIC CARCINOMA IN 1 OF 1 LYMPH NODE (1/1/). 5. Lymph node, biopsy, Level 7 #2 - METASTATIC CARCINOMA IN 1 OF 1 LYMPH NODE (1/1). 6. Lymph node, biopsy, Level 7 #3 - METASTATIC CARCINOMA IN 1 OF 1 LYMPH NODE (1/1). 7. Lymph node, biopsy, Level 7 #4 - METASTATIC CARCINOMA IN 1 OF 1 LYMPH NODE (1/1). 8. Lymph node, biopsy, 4R - THERE IS NO EVIDENCE OF CARCINOMA IN 1 OF 1 LYMPH NODE (0/1). 9. Lymph node, biopsy, 4R #2 - THERE IS NO EVIDENCE OF CARCINOMA IN 1 OF 1 LYMPH NODE (0/1).   01/05/2016 Pathology Results   PDL1 NEGATIVE- tumor proportion score of 0%.    01/05/2016 Pathology Results   Genomic alterations identified: BRAF V600E, KIT amplification, PDGFRA amplification,  CDKN2A/B loss, TP53 S26f*33.  Additional findings: MSI-STABLE.  No reportable alterations identified: EGFR, KRAS, ALK, MET, RET, ERBB2, ROS1   02/09/2016 Procedure   Port placed by IR.   02/16/2016 - 05/31/2016 Chemotherapy   Carboplatin/Pemetrexed x 6 cycles     - 08/01/2016 Radiation Therapy   [place], [place]   08/28/2016 Imaging   CT CAP- No evidence of recurrent or metastatic carcinoma within the chest, abdomen, or pelvis.  Stable incidental findings include a absent, small benign right adrenal adenoma, sigmoid diverticulosis, and aortic atherosclerosis.   09/04/2016 -  Chemotherapy   Imfinzi immunotherapy up to 52 weeks.    12/07/2016 Imaging   CT C/A/P: New focal streaky opacities within the peripheral posterior right lower lobe and anteromedial left upper lobe- likely atelectasis with infection or other process is less likely. Recommend attention to these areas on future scans.  Small right pleural effusion, slightly increased from 08/28/2016. No evidence of pleural mass or enhancement.  No evidence of malignancy/ metastatic disease within the abdomen or pelvis.    02/12/2017 Imaging   CT chest: IMPRESSION: 1. Stable CT of the chest. No specific findings identified to suggest residual or recurrence of tumor. 2. Persistent right pleural effusion. 3. Paramediastinal radiation change predominantly involving the right lung is similar to previous study. 4. Stable right adrenal gland nodule and low-density foci within the liver. 5. Aortic Atherosclerosis (ICD10-I70.0) and Emphysema (ICD10-J43.9). Multi vessel coronary artery calcifications noted.      CANCER STAGING: Cancer Staging Adenocarcinoma of right lung, stage 3 (HCC) Staging  form: Lung, AJCC 7th Edition - Clinical stage from 12/09/2015: Stage IA (T1a, N0, M0) - Signed by Baird Cancer, PA-C on 12/09/2015 - Pathologic stage from 01/20/2016: Stage IIIA (T1a, N2, cM0) - Signed by Baird Cancer, PA-C on  02/01/2016    INTERVAL HISTORY:  [name] 86 y.o. male seen for follow-up of lung cancer and B12 deficiency.  He is accompanied by his daughter.  Reports appetite 100%.  Energy levels are 50%.  No new pains reported.  Right ankle swelling is stable.  Occasional headaches and dizziness is also stable.  Tingling in feet and hands has not changed much.     REVIEW OF SYSTEMS:  Review of Systems  Cardiovascular: Positive for leg swelling.  Neurological: Positive for dizziness and numbness.  Psychiatric/Behavioral: The patient is nervous/anxious.   All other systems reviewed and are negative.    PAST MEDICAL/SURGICAL HISTORY:  Past Medical History:  Diagnosis Date  . Adenocarcinoma of lung, stage 3 (HCC)    Stage IIIA  . Adenocarcinoma of right lung, stage 3 ([place]) 12/02/2015  . Arthritis   . Atrial fibrillation, transient ([place])    transient postop, < 24 hours  . B12 deficiency   . Cyst of scrotum   . Diverticulitis   . GERD (gastroesophageal reflux disease)   . Hernia   . History of pneumonia   . Hyperlipidemia   . Lung nodule    Spiculated right middle lobe nodule, hypermetabolic  . Stroke ([place])   . Type 2 diabetes mellitus ([place])    Past Surgical History:  Procedure Laterality Date  . BACK SURGERY    . CHOLECYSTECTOMY    . COLONOSCOPY N/A 11/24/2015   Procedure: COLONOSCOPY;  Surgeon: Rogene Houston, MD;  Location: [facility];  Service: Endoscopy;  Laterality: N/A;  730  . COLONOSCOPY W/ POLYPECTOMY     x 5  . CYST EXCISION     Scrotum  . EYE SURGERY Bilateral    Cataract with Lens  . HERNIA REPAIR Right    Inguinal  . IR GENERIC HISTORICAL  02/09/2016   IR US GUIDE VASC ACCESS RIGHT 02/09/2016 Arne Cleveland, MD [facility]-INTERV RAD  . IR GENERIC HISTORICAL  02/09/2016   IR FLUORO GUIDE CV LINE RIGHT 02/09/2016 Arne Cleveland, MD [facility]INTERV RAD  . LUMBAR DISC SURGERY     per patient report  . PORTACATH PLACEMENT    . VIDEO ASSISTED THORACOSCOPY (VATS)/ LOBECTOMY Right  12/30/2015   Procedure: VIDEO ASSISTED THORACOSCOPY (VATS)/RIGHT MIDDLE LOBECTOMY;  Surgeon: Melrose Nakayama, MD;  Location: [facility];  Service: Thoracic;  Laterality: Right;     SOCIAL HISTORY:  Social History   Socioeconomic History  . Marital status: Married    Spouse name: Not on file  . Number of children: Not on file  . Years of education: Not on file  . Highest education level: Not on file  Occupational History  . Not on file  Tobacco Use  . Smoking status: Former Smoker    Types: Cigarettes    Quit date: 05/27/2004    Years since quitting: 15.3  . Smokeless tobacco: Never Used  Substance and Sexual Activity  . Alcohol use: No    Alcohol/week: 0.0 standard drinks  . Drug use: No  . Sexual activity: Not on file    Comment: married  Other Topics Concern  . Not on file  Social History Narrative  . Not on file   Social Determinants of Health   Financial Resource  Strain:   . Difficulty of Paying Living Expenses:   Food Insecurity:   . Worried About Charity fundraiser in the Last Year:   . Arboriculturist in the Last Year:   Transportation Needs:   . Film/video editor (Medical):   Marland Kitchen Lack of Transportation (Non-Medical):   Physical Activity:   . Days of Exercise per Week:   . Minutes of Exercise per Session:   Stress:   . Feeling of Stress :   Social Connections:   . Frequency of Communication with Friends and Family:   . Frequency of Social Gatherings with Friends and Family:   . Attends Religious Services:   . Active Member of Clubs or Organizations:   . Attends Archivist Meetings:   Marland Kitchen Marital Status:   Intimate Partner Violence:   . Fear of Current or Ex-Partner:   . Emotionally Abused:   Marland Kitchen Physically Abused:   . Sexually Abused:     FAMILY HISTORY:  Family History  Problem Relation Age of Onset  . Colon cancer Brother     CURRENT MEDICATIONS:  Outpatient Encounter Medications as of 10/01/2019  Medication Sig  . zolpidem  (AMBIEN) 10 MG tablet Take 0.5 tablets (5 mg total) by mouth at bedtime as needed for sleep. For sleep  . ACCU-CHEK AVIVA PLUS test strip USE 5 STRIPS DAILY TO MONITOR FLUCTUATING BLOOD SUGAR  . ACCU-CHEK FASTCLIX LANCETS MISC USE 6 LANCETS DAILY  . ALPRAZolam (XANAX) 0.5 MG tablet Take 0.5 mg by mouth daily.  Marland Kitchen aspirin EC 81 MG tablet Take 81 mg by mouth daily.  . calcium carbonate (OS-CAL - DOSED IN MG OF ELEMENTAL CALCIUM) 1250 (500 Ca) MG tablet Take 1 tablet (500 mg of elemental calcium total) by mouth daily with breakfast.  . Cholecalciferol (VITAMIN D) 2000 UNITS CAPS Take 1 capsule by mouth daily.   . Cyanocobalamin (VITAMIN B-12 IJ) Inject 1 Dose as directed every 30 (thirty) days.  Marland Kitchen glipiZIDE (GLUCOTROL) 5 MG tablet Take 1 tablet (5 mg total) by mouth 2 (two) times daily before a meal. (Patient taking differently: Take 2.5 mg by mouth 2 (two) times daily before a meal. )  . lidocaine-prilocaine (EMLA) cream Apply a quarter size amount to port site 1 hour prior to chemo. Do not rub in. Cover with plastic wrap. (Patient not taking: Reported on 07/03/2019)  . lisinopril (PRINIVIL,ZESTRIL) 40 MG tablet Take 1 tablet (40 mg total) by mouth daily.  Marland Kitchen loratadine (CLARITIN) 10 MG tablet Take 10 mg by mouth daily.  . meclizine (ANTIVERT) 25 MG tablet Take 25 mg by mouth 3 (three) times daily.   . ondansetron (ZOFRAN) 8 MG tablet Take 1 tablet (8 mg total) by mouth every 8 (eight) hours as needed for nausea or vomiting. (Patient not taking: Reported on 07/03/2019)  . pantoprazole (PROTONIX) 40 MG tablet Take 40 mg by mouth 2 (two) times daily.   . potassium chloride SA (K-DUR,KLOR-CON) 20 MEQ tablet Take 1 tablet (20 mEq total) by mouth 2 (two) times daily.  . prochlorperazine (COMPAZINE) 10 MG tablet TAKE ONE TABLET BY MOUTH EVERY 6 HOURS AS NEEDED FOR NAUSEA AND VOMITING (Patient not taking: Reported on 07/03/2019)  . simvastatin (ZOCOR) 40 MG tablet Take 40 mg by mouth at bedtime.   Marland Kitchen terazosin  (HYTRIN) 5 MG capsule Take 5 mg by mouth at bedtime.  Marland Kitchen tiZANidine (ZANAFLEX) 4 MG tablet Take 4 mg by mouth at bedtime.   Facility-Administered Encounter Medications  as of 10/01/2019  Medication  . cyanocobalamin ((VITAMIN B-12)) injection 1,000 mcg  . heparin lock flush 100 unit/mL  . sodium chloride flush (NS) 0.9 % injection 10 mL    ALLERGIES:  Allergies  Allergen Reactions  . Morphine And Related Other (See Comments)    confusion     PHYSICAL EXAM:  ECOG Performance status: 1  Vitals:   10/01/19 1036  BP: (!) 118/56  Pulse: 76  Resp: 16  Temp: (!) 96.8 F (36 C)  SpO2: 96%   Filed Weights   10/01/19 1036  Weight: 212 lb 6.4 oz (96.3 kg)    Physical Exam Constitutional:      Appearance: Normal appearance. He is normal weight.  Cardiovascular:     Rate and Rhythm: Normal rate and regular rhythm.     Heart sounds: Normal heart sounds.  Pulmonary:     Effort: Pulmonary effort is normal.     Breath sounds: Normal breath sounds.  Abdominal:     General: Bowel sounds are normal.     Palpations: Abdomen is soft.  Musculoskeletal:        General: Normal range of motion.  Skin:    General: Skin is warm.  Neurological:     Mental Status: He is alert and oriented to person, place, and time. Mental status is at baseline.  Psychiatric:        Mood and Affect: Mood normal.        Behavior: Behavior normal.        Thought Content: Thought content normal.        Judgment: Judgment normal.      LABORATORY DATA:  I have reviewed the labs as listed.  CBC    Component Value Date/Time   WBC 7.3 09/24/2019 0948   RBC 4.37 09/24/2019 0948   HGB 13.1 09/24/2019 0948   HCT 40.2 09/24/2019 0948   PLT 255 09/24/2019 0948   MCV 92.0 09/24/2019 0948   MCH 30.0 09/24/2019 0948   MCHC 32.6 09/24/2019 0948   RDW 12.6 09/24/2019 0948   LYMPHSABS 1.6 09/24/2019 0948   MONOABS 0.6 09/24/2019 0948   EOSABS 0.4 09/24/2019 0948   BASOSABS 0.0 09/24/2019 0948   CMP  Latest Ref Rng & Units 09/24/2019 06/29/2019 03/26/2019  Glucose 70 - 99 mg/dL 240(H) 106(H) 165(H)  BUN 8 - 23 mg/dL '11 11 13  ' Creatinine 0.61 - 1.24 mg/dL 1.06 0.98 1.06  Sodium 135 - 145 mmol/L 136 140 133(L)  Potassium 3.5 - 5.1 mmol/L 3.9 3.5 3.7  Chloride 98 - 111 mmol/L 101 106 103  CO2 22 - 32 mmol/L '26 27 25  ' Calcium 8.9 - 10.3 mg/dL 8.9 8.5(L) 8.3(L)  Total Protein 6.5 - 8.1 g/dL 6.2(L) 6.0(L) 6.2(L)  Total Bilirubin 0.3 - 1.2 mg/dL 0.7 0.5 0.6  Alkaline Phos 38 - 126 U/L 62 62 61  AST 15 - 41 U/L '22 21 23  ' ALT 0 - 44 U/L '20 20 19     ' DIAGNOSTIC IMAGING:  I have reviewed his scans.   ASSESSMENT & PLAN:   Adenocarcinoma of right lung, stage 3 (HCC) 1.  Stage III right lung adenocarcinoma: -Diagnosed in June 2017, status post right lower lobectomy, VATS with mediastinal node sampling, PD-L1 negative. -6 cycles of carboplatin and pemetrexed completed on 05/31/2016, XRT completed on 08/01/2016. -Consolidation durvalumab from 09/04/2016 through 08/28/2017. -We reviewed CT chest without contrast from 06/29/2019 which showed slight interval increase in small right pleural effusion, otherwise no evidence of  recurrence or metastatic disease.  No change in the 5 mm left lower lobe pulmonary nodule. -I plan to repeat scan in 2 to 3 months.  He does not have any clinical signs or symptoms of recurrence at this time.  2.  Iron deficiency anemia: -Last Feraheme infusion was on 06/06/2018. -We have reviewed labs from 09/24/2019.  Hemoglobin and ferritin were normal.  TSH was normal.  3.  B12 deficiency: -His B12 level was 450.  He will continue B12 monthly injections.      Orders placed this encounter:  Orders Placed This Encounter  Procedures  . CT Chest W Contrast  . CBC with Differential/Platelet  . Comprehensive metabolic panel  . Vitamin B12      Derek Jack, [place] [phone]

## 2019-11-02 ENCOUNTER — Encounter (HOSPITAL_COMMUNITY): Payer: Self-pay

## 2019-11-02 ENCOUNTER — Inpatient Hospital Stay (HOSPITAL_COMMUNITY): Payer: Medicare Other | Attending: Hematology

## 2019-11-02 ENCOUNTER — Other Ambulatory Visit: Payer: Self-pay

## 2019-11-02 VITALS — BP 150/71 | HR 84 | Temp 98.6°F | Resp 20

## 2019-11-02 DIAGNOSIS — E538 Deficiency of other specified B group vitamins: Secondary | ICD-10-CM

## 2019-11-02 MED ORDER — CYANOCOBALAMIN 1000 MCG/ML IJ SOLN
1000.0000 ug | Freq: Once | INTRAMUSCULAR | Status: AC
  Administered 2019-11-02: 1000 ug via INTRAMUSCULAR

## 2019-11-02 NOTE — Progress Notes (Signed)
Gary Doneta [name] tolerated Vit B12 injection well without complaints or incident. VSS Pt discharged self ambulatory using his cane in satisfactory condition

## 2019-11-02 NOTE — Patient Instructions (Signed)
[place] at [facility] Discharge Instructions  Received Vit b12 injection today. Follow-up as scheduled. Call clinic for any questions or concerns   Thank you for choosing [place] at [facility] to provide your oncology and hematology care.  To afford each patient quality time with our provider, please arrive at least 15 minutes before your scheduled appointment time.   If you have a lab appointment with the [facility] please come in thru the Main Entrance and check in at the main information desk.  You need to re-schedule your appointment should you arrive 10 or more minutes late.  We strive to give you quality time with our providers, and arriving late affects you and other patients whose appointments are after yours.  Also, if you no show three or more times for appointments you may be dismissed from the clinic at the providers discretion.     Again, thank you for choosing [facility].  Our hope is that these requests will decrease the amount of time that you wait before being seen by our physicians.       _____________________________________________________________  Should you have questions after your visit to [facility], please contact our office at [phone] between the hours of 8:00 a.m. and 4:30 p.m.  Voicemails left after 4:00 p.m. will not be returned until the following business day.  For prescription refill requests, have your pharmacy contact our office and allow 72 hours.    Due to Covid, you will need to wear a mask upon entering the hospital. If you do not have a mask, a mask will be given to you at the Main Entrance upon arrival. For doctor visits, patients may have 1 support person with them. For treatment visits, patients can not have anyone with them due to social distancing guidelines and our immunocompromised population.

## 2019-11-05 ENCOUNTER — Encounter: Payer: Self-pay | Admitting: Emergency Medicine

## 2019-11-05 ENCOUNTER — Emergency Department (INDEPENDENT_AMBULATORY_CARE_PROVIDER_SITE_OTHER)
Admission: EM | Admit: 2019-11-05 | Discharge: 2019-11-05 | Disposition: A | Discharge status: Home / Self Care | Payer: Medicare Other | Source: Home / Self Care | Attending: Family Medicine | Admitting: Family Medicine

## 2019-11-05 DIAGNOSIS — R350 Frequency of micturition: Secondary | ICD-10-CM | POA: Diagnosis not present

## 2019-11-05 DIAGNOSIS — R300 Dysuria: Secondary | ICD-10-CM | POA: Diagnosis not present

## 2019-11-05 DIAGNOSIS — E1165 Type 2 diabetes mellitus with hyperglycemia: Secondary | ICD-10-CM

## 2019-11-05 DIAGNOSIS — R81 Glycosuria: Secondary | ICD-10-CM | POA: Diagnosis not present

## 2019-11-05 LAB — POCT URINALYSIS DIP (MANUAL ENTRY)
Bilirubin, UA: NEGATIVE
Blood, UA: NEGATIVE
Glucose, UA: >=1000 mg/dL — AB
Ketones, POC UA: NEGATIVE mg/dL
Leukocytes, UA: NEGATIVE
Nitrite, UA: NEGATIVE
Protein Ur, POC: NEGATIVE mg/dL
Spec Grav, UA: 1.01 (ref 1.010–1.025)
Urobilinogen, UA: 0.2 E.U./dL
pH, UA: 5.5 (ref 5.0–8.0)

## 2019-11-05 NOTE — Discharge Instructions (Signed)
Please monitor your glucose several times daily, at different times of the day, record on a calendar with times of measurement and take this record to your Family Doctor.

## 2019-11-05 NOTE — ED Provider Notes (Signed)
[name] CARE    CSN: [id number] Arrival date & time: 11/05/19  4854      History   Chief Complaint Chief Complaint  Patient presents with  . Dysuria    HPI [name] is a 86 y.o. male.   Patient complains of urinary frequency and dysuria for about 4 weeks.  He denies urethral discharge, fever, flank pain.  He denies fevers, chills, and sweats.  He has diabetes, and checked his glucose at home this morning: 141.  He does not recall his last Hgb A1C.   The history is provided by the patient and a relative.    Past Medical History:  Diagnosis Date  . Adenocarcinoma of lung, stage 3 (HCC)    Stage IIIA  . Adenocarcinoma of right lung, stage 3 ([place]) 12/02/2015  . Arthritis   . Atrial fibrillation, transient ([place])    transient postop, < 24 hours  . B12 deficiency   . Cyst of scrotum   . Diverticulitis   . GERD (gastroesophageal reflux disease)   . Hernia   . History of pneumonia   . Hyperlipidemia   . Lung nodule    Spiculated right middle lobe nodule, hypermetabolic  . Stroke ([place])   . Type 2 diabetes mellitus [facility])     Patient Active Problem List   Diagnosis Date Noted  . Atrial fibrillation, transient ([place]) 01/20/2016  . S/P lobectomy of lung 12/30/2015  . History of tobacco abuse 12/14/2015  . B12 deficiency 12/08/2015  . History of colonic polyps 12/02/2015  . Adenocarcinoma of right lung, stage 3 ([place]) 12/02/2015  . High cholesterol 11/03/2015  . Dysphagia, pharyngoesophageal phase 07/30/2012  . Diabetes ([place]) 07/30/2012    Past Surgical History:  Procedure Laterality Date  . BACK SURGERY    . CHOLECYSTECTOMY    . COLONOSCOPY N/A 11/24/2015   Procedure: COLONOSCOPY;  Surgeon: Rogene Houston, MD;  Location[facility];  Service: Endoscopy;  Laterality: N/A;  730  . COLONOSCOPY W/ POLYPECTOMY     x 5  . CYST EXCISION     Scrotum  . EYE SURGERY Bilateral    Cataract with Lens  . HERNIA REPAIR Right    Inguinal  . IR GENERIC  HISTORICAL  02/09/2016   IR US GUIDE VASC ACCESS RIGHT 02/09/2016 Arne Cleveland, MD [facility]  . IR GENERIC HISTORICAL  02/09/2016   IR FLUORO GUIDE CV LINE RIGHT 02/09/2016 Arne Cleveland, MD [facility]  . LUMBAR DISC SURGERY     per patient report  . PORTACATH PLACEMENT    . VIDEO ASSISTED THORACOSCOPY (VATS)/ LOBECTOMY Right 12/30/2015   Procedure: VIDEO ASSISTED THORACOSCOPY (VATS)/RIGHT MIDDLE LOBECTOMY;  Surgeon: Melrose Nakayama, MD;  Location: [place];  Service: Thoracic;  Laterality: Right;       Home Medications    Prior to Admission medications   Medication Sig Start Date End Date Taking? Authorizing Provider  ACCU-CHEK AVIVA PLUS test strip USE 5 STRIPS DAILY TO MONITOR FLUCTUATING BLOOD SUGAR 11/13/17  Yes [provider]  ALPRAZolam (XANAX) 0.5 MG tablet Take 0.5 mg by mouth daily.   Yes [provider]  aspirin EC 81 MG tablet Take 81 mg by mouth daily.   Yes [provider]  Cholecalciferol (VITAMIN D) 2000 UNITS CAPS Take 1 capsule by mouth daily.    Yes [provider]  glipiZIDE (GLUCOTROL) 5 MG tablet Take 1 tablet (5 mg total) by mouth 2 (two) times daily before a meal. Patient  taking differently: Take 2.5 mg by mouth 2 (two) times daily before a meal.  01/05/16  Yes Gold, Wayne E, PA-C  loratadine (CLARITIN) 10 MG tablet Take 10 mg by mouth daily.   Yes [provider]  meclizine (ANTIVERT) 25 MG tablet Take 25 mg by mouth 3 (three) times daily.    Yes [provider]  pantoprazole (PROTONIX) 40 MG tablet Take 40 mg by mouth 2 (two) times daily.    Yes [provider]  potassium chloride SA (K-DUR,KLOR-CON) 20 MEQ tablet Take 1 tablet (20 mEq total) by mouth 2 (two) times daily. 04/11/18  Yes Virgel Manifold, MD  simvastatin (ZOCOR) 40 MG tablet Take 40 mg by mouth at bedtime.    Yes [provider]  terazosin (HYTRIN) 5 MG capsule Take 5 mg by mouth at bedtime.   Yes [provider]   ACCU-CHEK FASTCLIX LANCETS MISC USE 6 LANCETS DAILY 11/13/17   [provider]  calcium carbonate (OS-CAL - DOSED IN MG OF ELEMENTAL CALCIUM) 1250 (500 Ca) MG tablet Take 1 tablet (500 mg of elemental calcium total) by mouth daily with breakfast. 04/19/16   Kefalas, Manon Hilding, PA-C  Cyanocobalamin (VITAMIN B-12 IJ) Inject 1 Dose as directed every 30 (thirty) days.    [provider]  lidocaine-prilocaine (EMLA) cream Apply a quarter size amount to port site 1 hour prior to chemo. Do not rub in. Cover with plastic wrap. Patient not taking: Reported on 07/03/2019 02/01/16   Patrici Ranks, MD  lisinopril (PRINIVIL,ZESTRIL) 40 MG tablet Take 1 tablet (40 mg total) by mouth daily. 01/05/16   Gold, Wayne E, PA-C  ondansetron (ZOFRAN) 8 MG tablet Take 1 tablet (8 mg total) by mouth every 8 (eight) hours as needed for nausea or vomiting. Patient not taking: Reported on 07/03/2019 02/01/16   Patrici Ranks, MD  prochlorperazine (COMPAZINE) 10 MG tablet TAKE ONE TABLET BY MOUTH EVERY 6 HOURS AS NEEDED FOR NAUSEA AND VOMITING Patient not taking: Reported on 07/03/2019 01/31/17   Holley Bouche, NP  tiZANidine (ZANAFLEX) 4 MG tablet Take 4 mg by mouth at bedtime.    [provider]  zolpidem (AMBIEN) 10 MG tablet Take 0.5 tablets (5 mg total) by mouth at bedtime as needed for sleep. For sleep 11/28/18   Glennie Isle, NP-C    Family History Family History  Problem Relation Age of Onset  . Colon cancer Brother     Social History Social History   Tobacco Use  . Smoking status: Former Smoker    Types: Cigarettes    Quit date: 05/27/2004    Years since quitting: 15.4  . Smokeless tobacco: Never Used  Substance Use Topics  . Alcohol use: No    Alcohol/week: 0.0 standard drinks  . Drug use: No     Allergies   Morphine and related   Review of Systems Review of Systems No sore throat No cough No pleuritic pain No wheezing No nasal congestion No post-nasal  drainage No sinus pain/pressure No itchy/red eyes No earache No hemoptysis No SOB No fever/chills No nausea No vomiting No abdominal pain No diarrhea + urinary frequency and dysuria No skin rash + fatigue No myalgias No headache   Physical Exam Triage Vital Signs ED Triage Vitals  Enc Vitals Group     BP 11/05/19 0949 134/71     Pulse Rate 11/05/19 0949 93     Resp 11/05/19 0949 (!) 21     Temp 11/05/19 0949  97.8 F (36.6 C)     Temp Source 11/05/19 0949 Oral     SpO2 11/05/19 0949 95 %     Weight 11/05/19 0953 204 lb (92.5 kg)     Height 11/05/19 0953 5\' 11"  (1.803 m)     Head Circumference --      Peak Flow --      Pain Score 11/05/19 0953 3     Pain Loc --      Pain Edu? --      Excl. in [place]? --    No data found.  Updated Vital Signs BP 134/71 (BP Location: Left Arm)   Pulse 93   Temp 97.8 F (36.6 C) (Oral)   Resp (!) 21   Ht 5\' 11"  (1.803 m)   Wt 92.5 kg   SpO2 95%   BMI 28.45 kg/m   Visual Acuity Right Eye Distance:   Left Eye Distance:   Bilateral Distance:    Right Eye Near:   Left Eye Near:    Bilateral Near:     Physical Exam Nursing notes and Vital Signs reviewed. Appearance:  Patient appears stated age, and in no acute distress.    Eyes:  Pupils are equal, round, and reactive to light and accomodation.  Extraocular movement is intact.  Conjunctivae are not inflamed   Pharynx:  Normal; moist mucous membranes  Neck:  Supple.  No adenopathy Lungs:  Clear to auscultation.  Breath sounds are equal.  Moving air well. Heart:  Regular rate and rhythm without murmurs, rubs, or gallops.  Abdomen:  Nontender without masses or hepatosplenomegaly.  Bowel sounds are present.  No CVA or flank tenderness.  Extremities:  No edema.  Skin:  No rash present.     UC Treatments / Results  Labs (all labs ordered are listed, but only abnormal results are displayed) Labs Reviewed  POCT URINALYSIS DIP (MANUAL ENTRY) - Abnormal; Notable for the following  components:      Result Value   Glucose, UA >=1,000 (*)    All other components within normal limits  URINE CULTURE  HEMOGLOBIN A1C    EKG   Radiology No results found.  Procedures Procedures (including critical care time)  Medications Ordered in UC Medications - No data to display  Initial Impression / Assessment and Plan / UC Course  I have reviewed the triage vital signs and the nursing notes.  Pertinent labs & imaging results that were available during my care of the patient were reviewed by me and considered in my medical decision making (see chart for details).    Suspect poor diabetes control as cause of patient's frequency/dysuria. Urine culture and Hgb A1C pending. Followup with Family Doctor.   Final Clinical Impressions(s) / UC Diagnoses   Final diagnoses:  Dysuria  Frequency of micturition  Glucosuria  Type 2 diabetes mellitus with hyperglycemia, without long-term current use of insulin [facility])     Discharge Instructions     Please monitor your glucose several times daily, at different times of the day, record on a calendar with times of measurement and take this record to your Family Doctor.     ED Prescriptions    None        Kandra Nicolas, MD 11/07/19 1526

## 2019-11-05 NOTE — ED Triage Notes (Signed)
Dysuria x 4 weeks or more Pt is being treated for lung cancer Porta Cath on Right No OTC meds CBG at home was 141 this am Pt here w/ his son

## 2019-11-06 LAB — URINE CULTURE
MICRO NUMBER:: 10527162
Result:: NO GROWTH
SPECIMEN QUALITY:: ADEQUATE

## 2019-11-06 LAB — HEMOGLOBIN A1C
Hgb A1c MFr Bld: 7 % of total Hgb — ABNORMAL HIGH (ref <5.7)
Mean Plasma Glucose: 154 (calc)
eAG (mmol/L): 8.5 (calc)

## 2019-12-03 ENCOUNTER — Other Ambulatory Visit: Payer: Self-pay

## 2019-12-03 ENCOUNTER — Inpatient Hospital Stay (HOSPITAL_COMMUNITY): Payer: Medicare Other | Attending: Hematology

## 2019-12-03 ENCOUNTER — Encounter (HOSPITAL_COMMUNITY): Payer: Self-pay

## 2019-12-03 VITALS — BP 150/58 | HR 94 | Temp 97.5°F | Resp 18 | Wt 203.2 lb

## 2019-12-03 DIAGNOSIS — E538 Deficiency of other specified B group vitamins: Secondary | ICD-10-CM | POA: Diagnosis present

## 2019-12-03 MED ORDER — CYANOCOBALAMIN 1000 MCG/ML IJ SOLN
INTRAMUSCULAR | Status: AC
  Filled 2019-12-03: qty 1

## 2019-12-03 MED ORDER — CYANOCOBALAMIN 1000 MCG/ML IJ SOLN
1000.0000 ug | Freq: Once | INTRAMUSCULAR | Status: AC
  Administered 2019-12-03: 1000 ug via INTRAMUSCULAR

## 2019-12-03 NOTE — Progress Notes (Signed)
[name] tolerated Vit B12 injection well without complaints or incident. VSS Pt discharged self ambulatory using his cane in satisfactory condition accompanied by his son

## 2019-12-03 NOTE — Patient Instructions (Signed)
[place] at [facility] Discharge Instructions  Received Vit B12 injection today. Follow-up as scheduled   Thank you for choosing [place] at [facility] to provide your oncology and hematology care.  To afford each patient quality time with our provider, please arrive at least 15 minutes before your scheduled appointment time.   If you have a lab appointment with the [place] please come in thru the Main Entrance and check in at the main information desk.  You need to re-schedule your appointment should you arrive 10 or more minutes late.  We strive to give you quality time with our providers, and arriving late affects you and other patients whose appointments are after yours.  Also, if you no show three or more times for appointments you may be dismissed from the clinic at the providers discretion.     Again, thank you for choosing [facility].  Our hope is that these requests will decrease the amount of time that you wait before being seen by our physicians.       _____________________________________________________________  Should you have questions after your visit to [facility], please contact our office at [phone] between the hours of 8:00 a.m. and 4:30 p.m.  Voicemails left after 4:00 p.m. will not be returned until the following business day.  For prescription refill requests, have your pharmacy contact our office and allow 72 hours.    Due to Covid, you will need to wear a mask upon entering the hospital. If you do not have a mask, a mask will be given to you at the Main Entrance upon arrival. For doctor visits, patients may have 1 support person with them. For treatment visits, patients can not have anyone with them due to social distancing guidelines and our immunocompromised population.

## 2019-12-31 ENCOUNTER — Ambulatory Visit (HOSPITAL_COMMUNITY)
Admission: RE | Admit: 2019-12-31 | Discharge: 2019-12-31 | Disposition: A | Discharge status: Home / Self Care | Payer: Medicare Other | Source: Ambulatory Visit | Attending: Hematology | Admitting: Hematology

## 2019-12-31 ENCOUNTER — Encounter (HOSPITAL_COMMUNITY): Payer: Self-pay

## 2019-12-31 ENCOUNTER — Inpatient Hospital Stay (HOSPITAL_COMMUNITY): Payer: Medicare Other | Attending: Hematology

## 2019-12-31 ENCOUNTER — Other Ambulatory Visit: Payer: Self-pay

## 2019-12-31 ENCOUNTER — Other Ambulatory Visit (HOSPITAL_COMMUNITY): Payer: Medicare Other

## 2019-12-31 VITALS — BP 149/57 | HR 80 | Temp 97.1°F | Resp 18

## 2019-12-31 DIAGNOSIS — R937 Abnormal findings on diagnostic imaging of other parts of musculoskeletal system: Secondary | ICD-10-CM | POA: Diagnosis not present

## 2019-12-31 DIAGNOSIS — Z79899 Other long term (current) drug therapy: Secondary | ICD-10-CM | POA: Insufficient documentation

## 2019-12-31 DIAGNOSIS — K219 Gastro-esophageal reflux disease without esophagitis: Secondary | ICD-10-CM | POA: Insufficient documentation

## 2019-12-31 DIAGNOSIS — Z9221 Personal history of antineoplastic chemotherapy: Secondary | ICD-10-CM | POA: Diagnosis not present

## 2019-12-31 DIAGNOSIS — E785 Hyperlipidemia, unspecified: Secondary | ICD-10-CM | POA: Diagnosis not present

## 2019-12-31 DIAGNOSIS — Z7984 Long term (current) use of oral hypoglycemic drugs: Secondary | ICD-10-CM | POA: Insufficient documentation

## 2019-12-31 DIAGNOSIS — E119 Type 2 diabetes mellitus without complications: Secondary | ICD-10-CM | POA: Insufficient documentation

## 2019-12-31 DIAGNOSIS — Z85118 Personal history of other malignant neoplasm of bronchus and lung: Secondary | ICD-10-CM | POA: Insufficient documentation

## 2019-12-31 DIAGNOSIS — Z7982 Long term (current) use of aspirin: Secondary | ICD-10-CM | POA: Insufficient documentation

## 2019-12-31 DIAGNOSIS — C3491 Malignant neoplasm of unspecified part of right bronchus or lung: Secondary | ICD-10-CM

## 2019-12-31 DIAGNOSIS — Z125 Encounter for screening for malignant neoplasm of prostate: Secondary | ICD-10-CM | POA: Insufficient documentation

## 2019-12-31 DIAGNOSIS — J90 Pleural effusion, not elsewhere classified: Secondary | ICD-10-CM | POA: Insufficient documentation

## 2019-12-31 DIAGNOSIS — D509 Iron deficiency anemia, unspecified: Secondary | ICD-10-CM | POA: Insufficient documentation

## 2019-12-31 DIAGNOSIS — Z923 Personal history of irradiation: Secondary | ICD-10-CM | POA: Insufficient documentation

## 2019-12-31 DIAGNOSIS — Z87891 Personal history of nicotine dependence: Secondary | ICD-10-CM | POA: Diagnosis not present

## 2019-12-31 DIAGNOSIS — Z902 Acquired absence of lung [part of]: Secondary | ICD-10-CM | POA: Diagnosis not present

## 2019-12-31 DIAGNOSIS — I4891 Unspecified atrial fibrillation: Secondary | ICD-10-CM | POA: Insufficient documentation

## 2019-12-31 DIAGNOSIS — Z9049 Acquired absence of other specified parts of digestive tract: Secondary | ICD-10-CM | POA: Diagnosis not present

## 2019-12-31 DIAGNOSIS — Z8673 Personal history of transient ischemic attack (TIA), and cerebral infarction without residual deficits: Secondary | ICD-10-CM | POA: Insufficient documentation

## 2019-12-31 DIAGNOSIS — M899 Disorder of bone, unspecified: Secondary | ICD-10-CM | POA: Diagnosis not present

## 2019-12-31 DIAGNOSIS — D3501 Benign neoplasm of right adrenal gland: Secondary | ICD-10-CM | POA: Diagnosis not present

## 2019-12-31 DIAGNOSIS — I2510 Atherosclerotic heart disease of native coronary artery without angina pectoris: Secondary | ICD-10-CM | POA: Insufficient documentation

## 2019-12-31 DIAGNOSIS — E538 Deficiency of other specified B group vitamins: Secondary | ICD-10-CM | POA: Diagnosis present

## 2019-12-31 DIAGNOSIS — Z800 Family history of malignant neoplasm of digestive organs: Secondary | ICD-10-CM | POA: Insufficient documentation

## 2019-12-31 DIAGNOSIS — Z95828 Presence of other vascular implants and grafts: Secondary | ICD-10-CM

## 2019-12-31 LAB — COMPREHENSIVE METABOLIC PANEL
ALT: 23 U/L (ref 0–44)
AST: 25 U/L (ref 15–41)
Albumin: 3.5 g/dL (ref 3.5–5.0)
Alkaline Phosphatase: 70 U/L (ref 38–126)
Anion gap: 9 (ref 5–15)
BUN: 14 mg/dL (ref 8–23)
CO2: 23 mmol/L (ref 22–32)
Calcium: 8.6 mg/dL — ABNORMAL LOW (ref 8.9–10.3)
Chloride: 104 mmol/L (ref 98–111)
Creatinine, Ser: 1.04 mg/dL (ref 0.61–1.24)
GFR calc Af Amer: >60 mL/min (ref >60)
GFR calc non Af Amer: >60 mL/min (ref >60)
Glucose, Bld: 156 mg/dL — ABNORMAL HIGH (ref 70–99)
Potassium: 3.8 mmol/L (ref 3.5–5.1)
Sodium: 136 mmol/L (ref 135–145)
Total Bilirubin: 0.6 mg/dL (ref 0.3–1.2)
Total Protein: 6.7 g/dL (ref 6.5–8.1)

## 2019-12-31 LAB — CBC WITH DIFFERENTIAL/PLATELET
Abs Immature Granulocytes: 0.02 10*3/uL (ref 0.00–0.07)
Basophils Absolute: 0 10*3/uL (ref 0.0–0.1)
Basophils Relative: 1 %
Eosinophils Absolute: 0.3 10*3/uL (ref 0.0–0.5)
Eosinophils Relative: 3 %
HCT: 38.7 % — ABNORMAL LOW (ref 39.0–52.0)
Hemoglobin: 12.7 g/dL — ABNORMAL LOW (ref 13.0–17.0)
Immature Granulocytes: 0 %
Lymphocytes Relative: 24 %
Lymphs Abs: 2 10*3/uL (ref 0.7–4.0)
MCH: 30.2 pg (ref 26.0–34.0)
MCHC: 32.8 g/dL (ref 30.0–36.0)
MCV: 92.1 fL (ref 80.0–100.0)
Monocytes Absolute: 0.6 10*3/uL (ref 0.1–1.0)
Monocytes Relative: 8 %
Neutro Abs: 5.2 10*3/uL (ref 1.7–7.7)
Neutrophils Relative %: 64 %
Platelets: 256 10*3/uL (ref 150–400)
RBC: 4.2 MIL/uL — ABNORMAL LOW (ref 4.22–5.81)
RDW: 12.9 % (ref 11.5–15.5)
WBC: 8.1 10*3/uL (ref 4.0–10.5)
nRBC: 0 % (ref 0.0–0.2)

## 2019-12-31 LAB — VITAMIN B12: Vitamin B-12: 402 pg/mL (ref 180–914)

## 2019-12-31 MED ORDER — CYANOCOBALAMIN 1000 MCG/ML IJ SOLN
1000.0000 ug | Freq: Once | INTRAMUSCULAR | Status: AC
  Administered 2019-12-31: 1000 ug via INTRAMUSCULAR

## 2019-12-31 MED ORDER — CYANOCOBALAMIN 1000 MCG/ML IJ SOLN
INTRAMUSCULAR | Status: AC
  Filled 2019-12-31: qty 1

## 2019-12-31 MED ORDER — HEPARIN SOD (PORK) LOCK FLUSH 100 UNIT/ML IV SOLN
500.0000 [IU] | Freq: Once | INTRAVENOUS | Status: AC
  Administered 2019-12-31: 500 [IU] via INTRAVENOUS

## 2019-12-31 MED ORDER — SODIUM CHLORIDE 0.9% FLUSH
10.0000 mL | INTRAVENOUS | Status: DC | PRN
  Administered 2019-12-31: 10 mL via INTRAVENOUS

## 2019-12-31 MED ORDER — IOHEXOL 300 MG/ML  SOLN
75.0000 mL | Freq: Once | INTRAMUSCULAR | Status: AC | PRN
  Administered 2019-12-31: 75 mL via INTRAVENOUS

## 2019-12-31 NOTE — Progress Notes (Signed)
[name] presented for Portacath access and flush.  Portacath located right chest wall accessed with  H 20 needle.  Good blood return present. Portacath flushed with 15ml NS and 500U/54ml Heparin and needle removed intact.  Procedure tolerated well and without incident.

## 2020-01-07 ENCOUNTER — Inpatient Hospital Stay (HOSPITAL_COMMUNITY): Payer: Medicare Other

## 2020-01-07 ENCOUNTER — Inpatient Hospital Stay (HOSPITAL_BASED_OUTPATIENT_CLINIC_OR_DEPARTMENT_OTHER): Payer: Medicare Other | Admitting: Hematology

## 2020-01-07 ENCOUNTER — Other Ambulatory Visit: Payer: Self-pay

## 2020-01-07 VITALS — BP 141/56 | HR 96 | Temp 97.7°F | Resp 20 | Wt 204.7 lb

## 2020-01-07 DIAGNOSIS — C3491 Malignant neoplasm of unspecified part of right bronchus or lung: Secondary | ICD-10-CM | POA: Diagnosis not present

## 2020-01-07 DIAGNOSIS — E538 Deficiency of other specified B group vitamins: Secondary | ICD-10-CM | POA: Diagnosis not present

## 2020-01-07 LAB — IRON AND TIBC
Iron: 74 ug/dL (ref 45–182)
Saturation Ratios: 27 % (ref 17.9–39.5)
TIBC: 278 ug/dL (ref 250–450)
UIBC: 204 ug/dL

## 2020-01-07 LAB — FERRITIN: Ferritin: 108 ng/mL (ref 24–336)

## 2020-01-07 NOTE — Progress Notes (Signed)
[name], [place] [address]   CLINIC:  Medical Oncology/Hematology  PCP:  Monico Blitz, [place] [address] [phone]   REASON FOR VISIT:  Follow-up for lung cancer  CURRENT THERAPY: Observation  BRIEF ONCOLOGIC HISTORY:  Oncology History  Adenocarcinoma of right lung, stage 3 ([facility])  11/10/2015 Imaging   13 mm spiculated lesion seen in right middle lobe.   11/30/2015 PET scan   Spiculated right middle lobe nodule is mildly hypermetabolic and most consistent with a stage IA adenocarcinoma.   12/30/2015 Surgery   Right middle lobectomy and node dissection   12/30/2015 Procedure   Right video-assisted thoracoscopy, Thoracoscopic right middle lobectomy, Mediastinal lymph node dissection, and On-Q local anesthetic catheter placement.   01/02/2016 Pathology Results   1. Lung, resection (segmental or lobe), Right Middle Lobe - INVASIVE ADENOCARCINOMA, WELL DIFFERENTIATED, SPANNING 1.2 CM. - THE SURGICAL RESECTION MARGINS ARE NEGATIVE FOR CARCINOMA. 2. Lymph node, biopsy, Level 12 - METASTATIC CARCINOMA IN 1 OF 1 LYMPH NODE (1/1). 3. Lymph node, biopsy, Level 12 #2 - METASTATIC CARCINOMA IN 1 OF 1 LYMPH NODE (1/1). 4. Lymph node, biopsy, Level 7 - METASTATIC CARCINOMA IN 1 OF 1 LYMPH NODE (1/1/). 5. Lymph node, biopsy, Level 7 #2 - METASTATIC CARCINOMA IN 1 OF 1 LYMPH NODE (1/1). 6. Lymph node, biopsy, Level 7 #3 - METASTATIC CARCINOMA IN 1 OF 1 LYMPH NODE (1/1). 7. Lymph node, biopsy, Level 7 #4 - METASTATIC CARCINOMA IN 1 OF 1 LYMPH NODE (1/1). 8. Lymph node, biopsy, 4R - THERE IS NO EVIDENCE OF CARCINOMA IN 1 OF 1 LYMPH NODE (0/1). 9. Lymph node, biopsy, 4R #2 - THERE IS NO EVIDENCE OF CARCINOMA IN 1 OF 1 LYMPH NODE (0/1).   01/05/2016 Pathology Results   PDL1 NEGATIVE- tumor proportion score of 0%.    01/05/2016 Pathology Results   Genomic alterations identified: BRAF V600E, KIT amplification, PDGFRA  amplification, CDKN2A/B loss, TP53 S41f*33.  Additional findings: MSI-STABLE.  No reportable alterations identified: EGFR, KRAS, ALK, MET, RET, ERBB2, ROS1   02/09/2016 Procedure   Port placed by IR.   02/16/2016 - 05/31/2016 Chemotherapy   Carboplatin/Pemetrexed x 6 cycles     - 08/01/2016 Radiation Therapy   [place],    08/28/2016 Imaging   CT CAP- No evidence of recurrent or metastatic carcinoma within the chest, abdomen, or pelvis.  Stable incidental findings include a absent, small benign right adrenal adenoma, sigmoid diverticulosis, and aortic atherosclerosis.   09/04/2016 -  Chemotherapy   Imfinzi immunotherapy up to 52 weeks.    12/07/2016 Imaging   CT C/A/P: New focal streaky opacities within the peripheral posterior right lower lobe and anteromedial left upper lobe- likely atelectasis with infection or other process is less likely. Recommend attention to these areas on future scans.  Small right pleural effusion, slightly increased from 08/28/2016. No evidence of pleural mass or enhancement.  No evidence of malignancy/ metastatic disease within the abdomen or pelvis.    02/12/2017 Imaging   CT chest: IMPRESSION: 1. Stable CT of the chest. No specific findings identified to suggest residual or recurrence of tumor. 2. Persistent right pleural effusion. 3. Paramediastinal radiation change predominantly involving the right lung is similar to previous study. 4. Stable right adrenal gland nodule and low-density foci within the liver. 5. Aortic Atherosclerosis (ICD10-I70.0) and Emphysema (ICD10-J43.9). Multi vessel coronary artery calcifications noted.     CANCER STAGING: Cancer Staging Adenocarcinoma of right lung, stage 3 ([facility]  Staging form: Lung, AJCC 7th Edition - Clinical stage from 12/09/2015: Stage IA (T1a, N0, M0) - Signed by Baird Cancer, PA-C on 12/09/2015 - Pathologic stage from 01/20/2016: Stage IIIA (T1a, N2, cM0) - Signed by Baird Cancer,  PA-C on 02/01/2016   INTERVAL HISTORY:  [name], a 86 y.o. male, returns for routine follow-up of his lung cancer. [name] was last seen on 10/01/2019.   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 No evidence of local tumor recurrence. Enlarging sclerotic lesion in the lower sternum, cannot exclude a sclerotic bone metastasis. Suggest correlation with PSA level. Otherwise no evidence of metastatic disease in the chest. Trace dependent right pleural effusion, decreased. Three-vessel coronary atherosclerosis. Stable ectatic 4.1 cm ascending thoracic aorta. Recommend annual imaging followup by CTA or MRA. This recommendation follows 2010 ACCF/AHA/AATS/ACR/ASA/SCA/SCAI/SIR/STS/SVM Guidelines for the Diagnosis and Management of Patients with Thoracic Aortic Disease. Circulation. 2010; 121: J191-Y782. Aortic aneurysm NOS (ICD10-I71.9). Stable right adrenal adenoma.  He has been ok health wise. He does complain of some intermittent back pain through his upper lumbar region. He notes shortness of breath on exertion and some headaches. He also complains of difficulty chewing due to the quality of his teeth.    REVIEW OF SYSTEMS:  Review of Systems  Constitutional: Positive for fatigue.  HENT:   Positive for trouble swallowing.   Eyes: Negative.   Respiratory: Positive for shortness of breath.   Cardiovascular: Negative.   Gastrointestinal: Negative.   Endocrine: Negative.   Genitourinary: Negative.    Musculoskeletal: Positive for back pain.  Skin: Negative.   Neurological: Positive for headaches.  Hematological: Negative.   Psychiatric/Behavioral: Negative.   All other systems reviewed and are negative.   PAST MEDICAL/SURGICAL HISTORY:  Past Medical History:  Diagnosis Date  . Adenocarcinoma of lung, stage 3 ([facility])    Stage IIIA  . Adenocarcinoma of right lung, stage 3 ([facility]) 12/02/2015  . Arthritis   . Atrial fibrillation,  transient ([facility])    transient postop, < 24 hours  . B12 deficiency   . Cyst of scrotum   . Diverticulitis   . GERD (gastroesophageal reflux disease)   . Hernia   . History of pneumonia   . Hyperlipidemia   . Lung nodule    Spiculated right middle lobe nodule, hypermetabolic  . Stroke ([facility])   . Type 2 diabetes mellitus ([facility])    Past Surgical History:  Procedure Laterality Date  . BACK SURGERY    . CHOLECYSTECTOMY    . COLONOSCOPY N/A 11/24/2015   Procedure: COLONOSCOPY;  Surgeon: Rogene Houston, MD;  Location: [facility];  Service: Endoscopy;  Laterality: N/A;  730  . COLONOSCOPY W/ POLYPECTOMY     x 5  . CYST EXCISION     Scrotum  . EYE SURGERY Bilateral    Cataract with Lens  . HERNIA REPAIR Right    Inguinal  . IR GENERIC HISTORICAL  02/09/2016   IR US GUIDE VASC ACCESS RIGHT 02/09/2016 Arne Cleveland, MD [facility]  . IR GENERIC HISTORICAL  02/09/2016   IR FLUORO GUIDE CV LINE RIGHT 02/09/2016 Arne Cleveland, MD [facility]  . LUMBAR DISC SURGERY     per patient report  . PORTACATH PLACEMENT    . VIDEO ASSISTED THORACOSCOPY (VATS)/ LOBECTOMY Right 12/30/2015   Procedure: VIDEO ASSISTED THORACOSCOPY (VATS)/RIGHT MIDDLE LOBECTOMY;  Surgeon: Melrose Nakayama, MD;  Location: [facility];  Service: Thoracic;  Laterality: Right;  SOCIAL HISTORY:  Social History   Socioeconomic History  . Marital status: Married    Spouse name: Not on file  . Number of children: Not on file  . Years of education: Not on file  . Highest education level: Not on file  Occupational History  . Not on file  Tobacco Use  . Smoking status: Former Smoker    Types: Cigarettes    Quit date: 05/27/2004    Years since quitting: 15.6  . Smokeless tobacco: Never Used  Vaping Use  . Vaping Use: Never used  Substance and Sexual Activity  . Alcohol use: No    Alcohol/week: 0.0 standard drinks  . Drug use: No  . Sexual activity: Not on file    Comment: married  Other Topics Concern  . Not  on file  Social History Narrative  . Not on file   Social Determinants of Health   Financial Resource Strain:   . Difficulty of Paying Living Expenses:   Food Insecurity:   . Worried About Charity fundraiser in the Last Year:   . Arboriculturist in the Last Year:   Transportation Needs:   . Film/video editor (Medical):   Marland Kitchen Lack of Transportation (Non-Medical):   Physical Activity:   . Days of Exercise per Week:   . Minutes of Exercise per Session:   Stress:   . Feeling of Stress :   Social Connections:   . Frequency of Communication with Friends and Family:   . Frequency of Social Gatherings with Friends and Family:   . Attends Religious Services:   . Active Member of Clubs or Organizations:   . Attends Archivist Meetings:   Marland Kitchen Marital Status:   Intimate Partner Violence:   . Fear of Current or Ex-Partner:   . Emotionally Abused:   Marland Kitchen Physically Abused:   . Sexually Abused:     FAMILY HISTORY:  Family History  Problem Relation Age of Onset  . Colon cancer Brother     CURRENT MEDICATIONS:  Current Outpatient Medications  Medication Sig Dispense Refill  . ACCU-CHEK AVIVA PLUS test strip USE 5 STRIPS DAILY TO MONITOR FLUCTUATING BLOOD SUGAR  4  . ACCU-CHEK FASTCLIX LANCETS MISC USE 6 LANCETS DAILY  5  . aspirin EC 81 MG tablet Take 81 mg by mouth daily.    . calcium carbonate (OS-CAL - DOSED IN MG OF ELEMENTAL CALCIUM) 1250 (500 Ca) MG tablet Take 1 tablet (500 mg of elemental calcium total) by mouth daily with breakfast. 30 tablet 2  . Cholecalciferol (VITAMIN D) 2000 UNITS CAPS Take 1 capsule by mouth daily.     . Cyanocobalamin (VITAMIN B-12 IJ) Inject 1 Dose as directed every 30 (thirty) days.    Marland Kitchen glipiZIDE (GLUCOTROL) 5 MG tablet Take 1 tablet (5 mg total) by mouth 2 (two) times daily before a meal. (Patient taking differently: Take 2.5 mg by mouth 2 (two) times daily before a meal. ) 60 tablet 1  . lisinopril (PRINIVIL,ZESTRIL) 40 MG tablet Take 1  tablet (40 mg total) by mouth daily. 30 tablet 1  . loratadine (CLARITIN) 10 MG tablet Take 10 mg by mouth daily.    . meclizine (ANTIVERT) 25 MG tablet Take 25 mg by mouth 3 (three) times daily.     . pantoprazole (PROTONIX) 40 MG tablet Take 40 mg by mouth 2 (two) times daily.     . potassium chloride SA (K-DUR,KLOR-CON) 20 MEQ tablet Take 1 tablet (20  mEq total) by mouth 2 (two) times daily. 6 tablet 0  . simvastatin (ZOCOR) 40 MG tablet Take 40 mg by mouth at bedtime.     Marland Kitchen terazosin (HYTRIN) 5 MG capsule Take 5 mg by mouth at bedtime.    Marland Kitchen tiZANidine (ZANAFLEX) 4 MG tablet Take 4 mg by mouth at bedtime.    Marland Kitchen zolpidem (AMBIEN) 10 MG tablet Take 0.5 tablets (5 mg total) by mouth at bedtime as needed for sleep. For sleep 30 tablet 1  . lidocaine-prilocaine (EMLA) cream Apply a quarter size amount to port site 1 hour prior to chemo. Do not rub in. Cover with plastic wrap. (Patient not taking: Reported on 01/07/2020) 30 g 3  . ondansetron (ZOFRAN) 8 MG tablet Take 1 tablet (8 mg total) by mouth every 8 (eight) hours as needed for nausea or vomiting. (Patient not taking: Reported on 01/07/2020) 30 tablet 2  . prochlorperazine (COMPAZINE) 10 MG tablet TAKE ONE TABLET BY MOUTH EVERY 6 HOURS AS NEEDED FOR NAUSEA AND VOMITING (Patient not taking: Reported on 01/07/2020) 30 tablet 2   No current facility-administered medications for this visit.   Facility-Administered Medications Ordered in Other Visits  Medication Dose Route Frequency Provider Last Rate Last Admin  . cyanocobalamin ((VITAMIN B-12)) injection 1,000 mcg  1,000 mcg Intramuscular Once Higgs, Vetta, MD      . heparin lock flush 100 unit/mL  500 Units Intravenous Once Derek Jack, MD      . sodium chloride flush (NS) 0.9 % injection 10 mL  10 mL Intravenous PRN Derek Jack, MD        ALLERGIES:  Allergies  Allergen Reactions  . Morphine And Related Other (See Comments)    confusion    PHYSICAL EXAM:  Performance  status (ECOG): 1 - Symptomatic but completely ambulatory  Vitals:   01/07/20 1357  BP: (!) 141/56  Pulse: 96  Resp: 20  Temp: 97.7 F (36.5 C)  SpO2: 95%   Wt Readings from Last 3 Encounters:  01/07/20 (!) 204 lb 11.2 oz (92.9 kg)  12/03/19 203 lb 3.2 oz (92.2 kg)  11/05/19 204 lb (92.5 kg)   Physical Exam Vitals reviewed.  Constitutional:      Appearance: Normal appearance.  HENT:     Mouth/Throat:     Mouth: Mucous membranes are moist.  Eyes:     Pupils: Pupils are equal, round, and reactive to light.  Cardiovascular:     Rate and Rhythm: Normal rate and regular rhythm.     Pulses: Normal pulses.     Heart sounds: Normal heart sounds.  Pulmonary:     Effort: Pulmonary effort is normal.     Breath sounds: Normal breath sounds.  Abdominal:     Palpations: Abdomen is soft. There is no mass.     Tenderness: There is no abdominal tenderness.  Musculoskeletal:     Right lower leg: No edema.     Left lower leg: No edema.  Neurological:     Mental Status: He is alert and oriented to person, place, and time.  Psychiatric:        Mood and Affect: Mood normal.        Behavior: Behavior normal.      LABORATORY DATA:  I have reviewed the labs as listed.  CBC Latest Ref Rng & Units 12/31/2019 09/24/2019 06/29/2019  WBC 4.0 - 10.5 K/uL 8.1 7.3 7.8  Hemoglobin 13.0 - 17.0 g/dL 12.7(L) 13.1 12.7(L)  Hematocrit 39 - 52 % 38.7(L)  40.2 39.0  Platelets 150 - 400 K/uL 256 255 246   CMP Latest Ref Rng & Units 12/31/2019 09/24/2019 06/29/2019  Glucose 70 - 99 mg/dL 156(H) 240(H) 106(H)  BUN 8 - 23 mg/dL '14 11 11  ' Creatinine 0.61 - 1.24 mg/dL 1.04 1.06 0.98  Sodium 135 - 145 mmol/L 136 136 140  Potassium 3.5 - 5.1 mmol/L 3.8 3.9 3.5  Chloride 98 - 111 mmol/L 104 101 106  CO2 22 - 32 mmol/L '23 26 27  ' Calcium 8.9 - 10.3 mg/dL 8.6(L) 8.9 8.5(L)  Total Protein 6.5 - 8.1 g/dL 6.7 6.2(L) 6.0(L)  Total Bilirubin 0.3 - 1.2 mg/dL 0.6 0.7 0.5  Alkaline Phos 38 - 126 U/L 70 62 62  AST 15 -  41 U/L '25 22 21  ' ALT 0 - 44 U/L '23 20 20    ' DIAGNOSTIC IMAGING:  I have independently reviewed the scans and discussed with the patient. CT Chest W Contrast  Result Date: 12/31/2019 CLINICAL DATA:  Stage III right middle lobe lung cancer status post right middle lobectomy. Restaging. EXAM: CT CHEST WITH CONTRAST TECHNIQUE: Multidetector CT imaging of the chest was performed during intravenous contrast administration. CONTRAST:  24m OMNIPAQUE IOHEXOL 300 MG/ML  SOLN COMPARISON:  06/29/2019 chest CT. FINDINGS: Cardiovascular: Normal heart size. No significant pericardial effusion/thickening. Three-vessel coronary atherosclerosis. Atherosclerotic thoracic aorta with ectatic 4.1 cm ascending thoracic aorta, stable. Normal caliber pulmonary arteries. No central pulmonary emboli. Right internal jugular Port-A-Cath terminates at the cavoatrial junction. Mediastinum/Nodes: No discrete thyroid nodules. Unremarkable esophagus. No pathologically enlarged axillary, mediastinal or hilar lymph nodes. Lungs/Pleura: No pneumothorax. Trace dependent right pleural effusion, decreased. No left pleural effusion. Status post right middle lobectomy. Stable sharply marginated bandlike right perihilar consolidation with associated mild volume loss and distortion, compatible with radiation fibrosis. Moderate centrilobular and paraseptal emphysema with diffuse bronchial wall thickening. No acute consolidative airspace disease or lung masses. Posterior left lower lobe 5 mm solid pulmonary nodule (series 4/image 105), stable since at least 05/20/2018 CT and considered benign. No new significant pulmonary nodules. Upper abdomen: Cholecystectomy. Scattered subcentimeter hypodense left liver lesions are too small to characterize and are unchanged since at least 05/20/2018 CT, considered benign. Right adrenal 1.4 cm nodule is stable since at least 05/20/2018 CT compatible with a benign adenoma. Musculoskeletal: Sclerotic 1.3 cm inferior  sternal lesion (series 6/image 106), increased from 0.9 cm on 06/29/2019 chest CT and 0.6 cm on 11/21/2018 chest CT. Marked thoracic spondylosis. IMPRESSION: 1. 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 No evidence of local tumor recurrence. 2. Enlarging sclerotic lesion in the lower sternum, cannot exclude a sclerotic bone metastasis. Suggest correlation with PSA level. 3. Otherwise no evidence of metastatic disease in the chest. 4. Trace dependent right pleural effusion, decreased. 5. Three-vessel coronary atherosclerosis. 6. Stable ectatic 4.1 cm ascending thoracic aorta. Recommend annual imaging followup by CTA or MRA. This recommendation follows 2010 ACCF/AHA/AATS/ACR/ASA/SCA/SCAI/SIR/STS/SVM Guidelines for the Diagnosis and Management of Patients with Thoracic Aortic Disease. Circulation. 2010; 121:: N867-E720 Aortic aneurysm NOS (ICD10-I71.9). 7. Stable right adrenal adenoma. 8. Aortic Atherosclerosis (ICD10-I70.0) and Emphysema (ICD10-J43.9). Electronically Signed   By: JIlona SorrelM.D.   On: 12/31/2019 16:55     ASSESSMENT:  1.  Stage III right lung adenocarcinoma: -Diagnosed in June 2017, status post right lower lobectomy, VATS with mediastinal lymph node sampling, PD-L1 negative. -6 cycles of carboplatin and pemetrexed completed on 05/31/2016, XRT completed on 08/01/2016. -Consolidation durvalumab from 09/04/2016 through 08/28/2017. -CT chest on 12/31/2019 showed stable  radiation fibrosis in the right perihilar lung.  No evidence of local tumor recurrence.  Enlarging sclerotic lesion in the lower sternum, cannot exclude sclerotic bone metastasis.  No evidence of metastatic disease in the chest.  Trace dependent right pleural effusion has decreased.  Stable at static 4.1 cm ascending thoracic aorta.  Stable right adrenal adenoma.     PLAN:  1.  Stage III right lung adenocarcinoma: -I have discussed the findings on the chest CT scan with the patient in detail.  He does not have  any pain in the lower sternal region. -Have checked PSA which was within normal limits of 1.88. -I have reviewed his routine labs.  Hemoglobin decreased by 12.7.  Will get ferritin and iron panel.  B12 was normal. -LFTs were also within normal limits.  2.  Iron deficiency anemia: -Last Feraheme was on 06/06/2018. -Ferritin today was 108 with a saturation of 27.  3.  Vitamin B12 deficiency: -B12 level is normal.  Continue monthly B12 shots.    Orders placed this encounter:  No orders of the defined types were placed in this encounter.    Derek Jack, MD [facility] [phone]   I, Jacqualyn Posey, am acting as a scribe for Dr. Sanda Linger.  I, Derek Jack MD, have reviewed the above documentation for accuracy and completeness, and I agree with the above.

## 2020-01-08 LAB — PSA: Prostatic Specific Antigen: 1.88 ng/mL (ref 0.00–4.00)

## 2020-01-14 ENCOUNTER — Encounter (INDEPENDENT_AMBULATORY_CARE_PROVIDER_SITE_OTHER): Payer: Self-pay | Admitting: Gastroenterology

## 2020-02-07 ENCOUNTER — Encounter (INDEPENDENT_AMBULATORY_CARE_PROVIDER_SITE_OTHER): Payer: Self-pay

## 2020-02-08 ENCOUNTER — Ambulatory Visit (INDEPENDENT_AMBULATORY_CARE_PROVIDER_SITE_OTHER): Payer: Medicare Other | Admitting: Gastroenterology

## 2020-02-09 ENCOUNTER — Inpatient Hospital Stay (HOSPITAL_COMMUNITY): Payer: Medicare Other | Attending: Hematology

## 2020-02-09 ENCOUNTER — Other Ambulatory Visit: Payer: Self-pay

## 2020-02-09 ENCOUNTER — Ambulatory Visit (INDEPENDENT_AMBULATORY_CARE_PROVIDER_SITE_OTHER): Payer: Medicare Other | Admitting: Gastroenterology

## 2020-02-09 ENCOUNTER — Encounter (HOSPITAL_COMMUNITY): Payer: Self-pay

## 2020-02-09 VITALS — BP 138/62 | HR 86 | Temp 97.2°F | Resp 18

## 2020-02-09 DIAGNOSIS — E538 Deficiency of other specified B group vitamins: Secondary | ICD-10-CM | POA: Diagnosis present

## 2020-02-09 MED ORDER — CYANOCOBALAMIN 1000 MCG/ML IJ SOLN
1000.0000 ug | Freq: Once | INTRAMUSCULAR | Status: AC
  Administered 2020-02-09: 1000 ug via INTRAMUSCULAR

## 2020-02-09 NOTE — Progress Notes (Signed)
[name] tolerated Vit B12 injection well without complaints or incident. VSS Pt discharged self ambulatory in satisfactory condition

## 2020-02-09 NOTE — Patient Instructions (Signed)
[place] at [facility] Discharge Instructions  Received Vit B 12 injection today. Follow-up as sheduled   Thank you for choosing [place] at [facility] to provide your oncology and hematology care.  To afford each patient quality time with our provider, please arrive at least 15 minutes before your scheduled appointment time.   If you have a lab appointment with the [facility] please come in thru the Main Entrance and check in at the main information desk.  You need to re-schedule your appointment should you arrive 10 or more minutes late.  We strive to give you quality time with our providers, and arriving late affects you and other patients whose appointments are after yours.  Also, if you no show three or more times for appointments you may be dismissed from the clinic at the providers discretion.     Again, thank you for choosing [facility].  Our hope is that these requests will decrease the amount of time that you wait before being seen by our physicians.       _____________________________________________________________  Should you have questions after your visit to [facility], please contact our office at [phone] and follow the prompts.  Our office hours are 8:00 a.m. and 4:30 p.m. Monday - Friday.  Please note that voicemails left after 4:00 p.m. may not be returned until the following business day.  We are closed weekends and major holidays.  You do have access to a nurse 24-7, just call the main number to the clinic [phone] and do not press any options, hold on the line and a nurse will answer the phone.    For prescription refill requests, have your pharmacy contact our office and allow 72 hours.    Due to Covid, you will need to wear a mask upon entering the hospital. If you do not have a mask, a mask will be given to you at the Main Entrance upon arrival. For doctor visits, patients may have  1 support person age 37 or older with them. For treatment visits, patients can not have anyone with them due to social distancing guidelines and our immunocompromised population.

## 2020-02-11 ENCOUNTER — Encounter (INDEPENDENT_AMBULATORY_CARE_PROVIDER_SITE_OTHER): Payer: Self-pay

## 2020-02-22 ENCOUNTER — Ambulatory Visit (INDEPENDENT_AMBULATORY_CARE_PROVIDER_SITE_OTHER): Payer: Medicare Other | Admitting: Gastroenterology

## 2020-02-29 ENCOUNTER — Encounter (INDEPENDENT_AMBULATORY_CARE_PROVIDER_SITE_OTHER): Payer: Self-pay | Admitting: Gastroenterology

## 2020-02-29 ENCOUNTER — Ambulatory Visit (INDEPENDENT_AMBULATORY_CARE_PROVIDER_SITE_OTHER): Payer: Medicare Other | Admitting: Gastroenterology

## 2020-02-29 ENCOUNTER — Other Ambulatory Visit: Payer: Self-pay

## 2020-02-29 DIAGNOSIS — R195 Other fecal abnormalities: Secondary | ICD-10-CM | POA: Insufficient documentation

## 2020-02-29 DIAGNOSIS — K5900 Constipation, unspecified: Secondary | ICD-10-CM | POA: Diagnosis not present

## 2020-02-29 NOTE — Progress Notes (Signed)
[name], M.D. Gastroenterology & Hepatology [facility] [address] [place],  [address] Primary Care Physician: Monico Blitz, [place] [address]  Referring MD: PCP  I will communicate my assessment and recommendations to the referring MD via EMR. Note: Occasional unusual wording and randomly placed punctuation marks may result from the use of speech recognition technology to transcribe this document"  Chief Complaint: Positive fecal occult blood testing  History of Present Illness: [name] is a 86 y/o M with PMH stage 3 lung adenocarcinoma s/p lung resection and CTX, afib, DM, stroke, HLD, GERD, diverticulitis, who comes to clinic for evaluation of positive fecal occult blood testing.  The patient reports that he was referred to our clinic after he had positive FOBT recently in [facility] that at his PCPs office.  He reports that he noticed around 3 weeks ago some episodes of possible melena after he started eating ice cream with black color.  He reported that these episodes have been intermittent.  Otherwise, he only reports that he has had a history of longstanding constipation, which he describes as having 1 very small bowel movement every day.  Occasionally when he feels that he wants to have a larger bowel movement he takes some docusate but this is not used on a frequent basis.  He denies taking any laxatives or other medications for his constipation. The patient denies having any nausea, vomiting, fever, chills, hematochezia, melena, hematemesis, abdominal distention, abdominal pain, diarrhea, jaundice, pruritus or weight loss.  Most recent labs from 12/31/2019 showed Hb 12.7 with MCV 92 , normal wBC and PLY. He had iron stores checked on 01/07/2020 which showed iron 74, TIBC 278, ferritin 108 and 27% saturation all WNL.  Last EGD: Never Last Colonoscopy: 2017 -presence of melanosis coli, 4 polyps were  found at the explain flexure and hepatic flexure, diverticulosis.  Normal terminal ileum.  Pathology was positive for tubular adenomas x4.  FHx: neg for any gastrointestinal/liver disease, brother had colon cancer at age 36 Social: Smokes cigarettes until 35 years ago, also stopped drinking alcohol 50 years ago, denies illicit drug use  Past Medical History: Past Medical History:  Diagnosis Date  . Adenocarcinoma of lung, stage 3 (HCC)    Stage IIIA  . Adenocarcinoma of right lung, stage 3 ([place]) 12/02/2015  . Arthritis   . Atrial fibrillation, transient ([place])    transient postop, < 24 hours  . B12 deficiency   . Cyst of scrotum   . Diverticulitis   . GERD (gastroesophageal reflux disease)   . Hernia   . History of pneumonia   . Hyperlipidemia   . Lung nodule    Spiculated right middle lobe nodule, hypermetabolic  . Stroke ([place])   . Type 2 diabetes mellitus ([place])     Past Surgical History: Past Surgical History:  Procedure Laterality Date  . BACK SURGERY    . CHOLECYSTECTOMY    . COLONOSCOPY N/A 11/24/2015   Procedure: COLONOSCOPY;  Surgeon: Rogene Houston, MD;  Location[facility];  Service: Endoscopy;  Laterality: N/A;  730  . COLONOSCOPY W/ POLYPECTOMY     x 5  . CYST EXCISION     Scrotum  . EYE SURGERY Bilateral    Cataract with Lens  . HERNIA REPAIR Right    Inguinal  . IR GENERIC HISTORICAL  02/09/2016   IR US GUIDE VASC ACCESS RIGHT 02/09/2016 Arne Cleveland, MD [facility]-INTER[facility] RAD  . IR  GENERIC HISTORICAL  02/09/2016   IR FLUORO GUIDE CV LINE RIGHT 02/09/2016 Arne Cleveland, MD [facility]INTER[facility]  . LUMBAR DISC SURGERY     per patient report  . PORTACATH PLACEMENT    . VIDEO ASSISTED THORACOSCOPY (VATS)/ LOBECTOMY Right 12/30/2015   Procedure: VIDEO ASSISTED THORACOSCOPY (VATS)/RIGHT MIDDLE LOBECTOMY;  Surgeon: Melrose Nakayama, MD;  Location: [facility];  Service: Thoracic;  Laterality: Right;    Family History: Family History  Problem Relation Age of Onset  . Colon  cancer Brother     Social History: Social History   Tobacco Use  Smoking Status Former Smoker  . Types: Cigarettes  . Quit date: 05/27/2004  . Years since quitting: 15.7  Smokeless Tobacco Never Used   Social History   Substance and Sexual Activity  Alcohol Use No  . Alcohol/week: 0.0 standard drinks   Social History   Substance and Sexual Activity  Drug Use No    Allergies: Allergies  Allergen Reactions  . Morphine And Related Other (See Comments)    confusion    Medications: Current Outpatient Medications  Medication Sig Dispense Refill  . ACCU-CHEK AVIVA PLUS test strip USE 5 STRIPS DAILY TO MONITOR FLUCTUATING BLOOD SUGAR  4  . ACCU-CHEK FASTCLIX LANCETS MISC USE 6 LANCETS DAILY  5  . aspirin EC 81 MG tablet Take 81 mg by mouth daily.    . benzonatate (TESSALON) 100 MG capsule Take by mouth 3 (three) times daily as needed for cough.    . calcium acetate, Phos Binder, (PHOSLYRA) 667 MG/5ML SOLN Take 1,334 mg by mouth 3 (three) times daily with meals.    . calcium carbonate (OS-CAL - DOSED IN MG OF ELEMENTAL CALCIUM) 1250 (500 Ca) MG tablet Take 1 tablet (500 mg of elemental calcium total) by mouth daily with breakfast. 30 tablet 2  . Cholecalciferol (VITAMIN D) 2000 UNITS CAPS Take 1 capsule by mouth daily.     . cholecalciferol (VITAMIN D3) 10 MCG (400 UNIT) TABS tablet Take 400 Units by mouth.    . Cyanocobalamin (VITAMIN B-12 IJ) Inject 1 Dose as directed every 30 (thirty) days.    Marland Kitchen gabapentin (NEURONTIN) 100 MG capsule Take 100 mg by mouth at bedtime.    Marland Kitchen glipiZIDE (GLUCOTROL) 5 MG tablet Take 1 tablet (5 mg total) by mouth 2 (two) times daily before a meal. (Patient taking differently: Take 2.5 mg by mouth 2 (two) times daily before a meal. ) 60 tablet 1  . lisinopril (ZESTRIL) 5 MG tablet Take 5 mg by mouth daily.    Marland Kitchen loratadine (CLARITIN) 10 MG tablet Take 10 mg by mouth daily.    . meclizine (ANTIVERT) 25 MG tablet Take 25 mg by mouth 3 (three) times  daily.     . pantoprazole (PROTONIX) 40 MG tablet Take 40 mg by mouth 2 (two) times daily.     . polyethylene glycol (MIRALAX / GLYCOLAX) 17 g packet Take 17 g by mouth daily.    . potassium chloride SA (K-DUR,KLOR-CON) 20 MEQ tablet Take 1 tablet (20 mEq total) by mouth 2 (two) times daily. 6 tablet 0  . simvastatin (ZOCOR) 40 MG tablet Take 40 mg by mouth at bedtime.     Marland Kitchen terazosin (HYTRIN) 5 MG capsule Take 5 mg by mouth at bedtime.    . lidocaine-prilocaine (EMLA) cream Apply a quarter size amount to port site 1 hour prior to chemo. Do not rub in. Cover with plastic wrap. (Patient not taking: Reported on 01/07/2020) 30  g 3   No current facility-administered medications for this visit.   Facility-Administered Medications Ordered in Other Visits  Medication Dose Route Frequency Provider Last Rate Last Admin  . cyanocobalamin ((VITAMIN B-12)) injection 1,000 mcg  1,000 mcg Intramuscular Once Higgs, Vetta, MD      . heparin lock flush 100 unit/mL  500 Units Intravenous Once Derek Jack, MD      . sodium chloride flush (NS) 0.9 % injection 10 mL  10 mL Intravenous PRN Derek Jack, MD        Review of Systems: GENERAL: negative for malaise, night sweats HEENT: No changes in hearing or vision, no nose bleeds or other nasal problems. NECK: Negative for lumps, goiter, pain and significant neck swelling RESPIRATORY: Negative for cough, wheezing CARDIOVASCULAR: Negative for chest pain, leg swelling, palpitations, orthopnea GI: SEE HPI MUSCULOSKELETAL: Negative for joint pain or swelling, back pain, and muscle pain. SKIN: Negative for lesions, rash PSYCH: Negative for sleep disturbance, mood disorder and recent psychosocial stressors. HEMATOLOGY Negative for prolonged bleeding, bruising easily, and swollen nodes. ENDOCRINE: Negative for cold or heat intolerance, polyuria, polydipsia and goiter. NEURO: negative for tremor, gait imbalance, syncope and seizures. The remainder of  the review of systems is noncontributory.   Physical Exam: BP 117/71 (BP Location: Right Arm, Patient Position: Sitting, Cuff Size: Normal)   Pulse 90   Temp 97.9 F (36.6 C) (Oral)   Ht 5\' 10"  (1.778 m)   Wt 201 lb 11.2 oz (91.5 kg)   BMI 28.94 kg/m  GENERAL: The patient is AO x3, in no acute distress. Elder. HEENT: Head is normocephalic and atraumatic. EOMI are intact. Mouth is well hydrated and without lesions. NECK: Supple. No masses LUNGS: Clear to auscultation. No presence of rhonchi/wheezing/rales. Adequate chest expansion HEART: RRR, normal s1 and s2. ABDOMEN: Soft, nontender, no guarding, no peritoneal signs, and nondistended. BS +. No masses. EXTREMITIES: Without any cyanosis, clubbing, rash, lesions or edema. NEUROLOGIC: AOx3, no focal motor deficit. SKIN: no jaundice, no rashes   Imaging/Labs: as above  I personally reviewed and interpreted the available labs, imaging and endoscopic files.  Impression and Plan: YAMAN GRAUBERGER is a 86 y/o M with PMH stage 3 lung adenocarcinoma s/p lung resection and CTX, afib, DM, stroke, HLD, GERD, diverticulitis, who comes to clinic for evaluation of positive fecal occult blood testing.  The patient has presented some episodes of questionable melena, but otherwise he has been asymptomatic.  However, he had recent positive FOBT testing.  He had mild anemia in recent blood testing but no presence of iron deficiency at that time, although these labs were performed before the patient had his current symptoms.  At this moment, given the fact it is unclear if she had any episodes of upper gastrointestinal bleeding, will need to perform an EGD to evaluate this further.  Also, since he had positive FOBT, will need to proceed with a colonoscopy.  I explained to him that given the fact he has been having chronic constipation, this could result in positive FOB.  He will benefit from implementing bowel regimen with MiraLAX on a daily  basis.  - Schedule EGD and colonoscopy  - Start Miralax daily  All questions were answered.      Gary Peppers, MD Gastroenterology and Hepatology [facility]

## 2020-02-29 NOTE — Patient Instructions (Addendum)
-   Schedule EGD and colonoscopy  - Start Miralax 1 cap daily

## 2020-03-03 ENCOUNTER — Telehealth (INDEPENDENT_AMBULATORY_CARE_PROVIDER_SITE_OTHER): Payer: Self-pay | Admitting: *Deleted

## 2020-03-03 ENCOUNTER — Encounter (INDEPENDENT_AMBULATORY_CARE_PROVIDER_SITE_OTHER): Payer: Self-pay | Admitting: *Deleted

## 2020-03-03 NOTE — Telephone Encounter (Signed)
Patient was seen 02/29/20 and computers were down. I wasn't able to schedule TCS/EGD (room 3 melen/positive FOBT)- we did let patient's son know we would call once computers were back up. I have left several message for son to call me to schedule - letter being mailed asking them to call

## 2020-03-10 ENCOUNTER — Inpatient Hospital Stay (HOSPITAL_COMMUNITY): Payer: Medicare Other | Attending: Hematology

## 2020-03-10 ENCOUNTER — Other Ambulatory Visit: Payer: Self-pay

## 2020-03-10 ENCOUNTER — Encounter (HOSPITAL_COMMUNITY): Payer: Self-pay

## 2020-03-10 VITALS — BP 125/66 | HR 85 | Temp 96.9°F | Resp 18

## 2020-03-10 DIAGNOSIS — E538 Deficiency of other specified B group vitamins: Secondary | ICD-10-CM | POA: Diagnosis present

## 2020-03-10 MED ORDER — CYANOCOBALAMIN 1000 MCG/ML IJ SOLN
1000.0000 ug | Freq: Once | INTRAMUSCULAR | Status: AC
  Administered 2020-03-10: 1000 ug via INTRAMUSCULAR

## 2020-03-10 NOTE — Progress Notes (Signed)
[name] tolerated Vit B12 injection well without complaints or incident. VSS Pt discharged self ambulatory in satisfactory condition accompanied by family member

## 2020-03-10 NOTE — Patient Instructions (Signed)
[place] at [facility] Discharge Instructions  Received Vit B12 injection today. Follow-up as scheduled   Thank you for choosing [place] at [facility] to provide your oncology and hematology care.  To afford each patient quality time with our provider, please arrive at least 15 minutes before your scheduled appointment time.   If you have a lab appointment with the [facility] please come in thru the Main Entrance and check in at the main information desk.  You need to re-schedule your appointment should you arrive 10 or more minutes late.  We strive to give you quality time with our providers, and arriving late affects you and other patients whose appointments are after yours.  Also, if you no show three or more times for appointments you may be dismissed from the clinic at the providers discretion.     Again, thank you for choosing [facility].  Our hope is that these requests will decrease the amount of time that you wait before being seen by our physicians.       _____________________________________________________________  Should you have questions after your visit to [facility], please contact our office at [phone] and follow the prompts.  Our office hours are 8:00 a.m. and 4:30 p.m. Monday - Friday.  Please note that voicemails left after 4:00 p.m. may not be returned until the following business day.  We are closed weekends and major holidays.  You do have access to a nurse 24-7, just call the main number to the clinic [phone] and do not press any options, hold on the line and a nurse will answer the phone.    For prescription refill requests, have your pharmacy contact our office and allow 72 hours.    Due to Covid, you will need to wear a mask upon entering the hospital. If you do not have a mask, a mask will be given to you at the Main Entrance upon arrival. For doctor visits, patients may have  1 support person age 46 or older with them. For treatment visits, patients can not have anyone with them due to social distancing guidelines and our immunocompromised population.

## 2020-04-11 ENCOUNTER — Ambulatory Visit (HOSPITAL_COMMUNITY): Payer: Medicare Other

## 2020-04-12 ENCOUNTER — Inpatient Hospital Stay (HOSPITAL_COMMUNITY): Payer: Medicare Other

## 2020-04-12 ENCOUNTER — Encounter (HOSPITAL_COMMUNITY): Payer: Self-pay | Admitting: Hematology

## 2020-04-12 ENCOUNTER — Inpatient Hospital Stay (HOSPITAL_COMMUNITY): Payer: Medicare Other | Attending: Hematology | Admitting: Hematology

## 2020-04-12 ENCOUNTER — Other Ambulatory Visit: Payer: Self-pay

## 2020-04-12 ENCOUNTER — Other Ambulatory Visit (HOSPITAL_COMMUNITY): Payer: Self-pay | Admitting: Surgery

## 2020-04-12 ENCOUNTER — Ambulatory Visit (HOSPITAL_COMMUNITY): Payer: Medicare Other

## 2020-04-12 VITALS — BP 128/71 | HR 92 | Temp 96.9°F | Resp 18 | Wt 199.4 lb

## 2020-04-12 DIAGNOSIS — C3431 Malignant neoplasm of lower lobe, right bronchus or lung: Secondary | ICD-10-CM | POA: Diagnosis not present

## 2020-04-12 DIAGNOSIS — C3491 Malignant neoplasm of unspecified part of right bronchus or lung: Secondary | ICD-10-CM | POA: Diagnosis not present

## 2020-04-12 DIAGNOSIS — E538 Deficiency of other specified B group vitamins: Secondary | ICD-10-CM | POA: Diagnosis present

## 2020-04-12 LAB — CBC WITH DIFFERENTIAL/PLATELET
Abs Immature Granulocytes: 0.03 10*3/uL (ref 0.00–0.07)
Basophils Absolute: 0 10*3/uL (ref 0.0–0.1)
Basophils Relative: 1 %
Eosinophils Absolute: 0.2 10*3/uL (ref 0.0–0.5)
Eosinophils Relative: 2 %
HCT: 39.4 % (ref 39.0–52.0)
Hemoglobin: 12.9 g/dL — ABNORMAL LOW (ref 13.0–17.0)
Immature Granulocytes: 0 %
Lymphocytes Relative: 17 %
Lymphs Abs: 1.4 10*3/uL (ref 0.7–4.0)
MCH: 30 pg (ref 26.0–34.0)
MCHC: 32.7 g/dL (ref 30.0–36.0)
MCV: 91.6 fL (ref 80.0–100.0)
Monocytes Absolute: 0.6 10*3/uL (ref 0.1–1.0)
Monocytes Relative: 8 %
Neutro Abs: 6 10*3/uL (ref 1.7–7.7)
Neutrophils Relative %: 72 %
Platelets: 304 10*3/uL (ref 150–400)
RBC: 4.3 MIL/uL (ref 4.22–5.81)
RDW: 13 % (ref 11.5–15.5)
WBC: 8.3 10*3/uL (ref 4.0–10.5)
nRBC: 0 % (ref 0.0–0.2)

## 2020-04-12 LAB — COMPREHENSIVE METABOLIC PANEL
ALT: 17 U/L (ref 0–44)
AST: 25 U/L (ref 15–41)
Albumin: 3 g/dL — ABNORMAL LOW (ref 3.5–5.0)
Alkaline Phosphatase: 61 U/L (ref 38–126)
Anion gap: 9 (ref 5–15)
BUN: 16 mg/dL (ref 8–23)
CO2: 22 mmol/L (ref 22–32)
Calcium: 8.4 mg/dL — ABNORMAL LOW (ref 8.9–10.3)
Chloride: 102 mmol/L (ref 98–111)
Creatinine, Ser: 1.39 mg/dL — ABNORMAL HIGH (ref 0.61–1.24)
GFR, Estimated: 49 mL/min — ABNORMAL LOW (ref >60)
Glucose, Bld: 253 mg/dL — ABNORMAL HIGH (ref 70–99)
Potassium: 3.6 mmol/L (ref 3.5–5.1)
Sodium: 133 mmol/L — ABNORMAL LOW (ref 135–145)
Total Bilirubin: 0.7 mg/dL (ref 0.3–1.2)
Total Protein: 6.1 g/dL — ABNORMAL LOW (ref 6.5–8.1)

## 2020-04-12 MED ORDER — SODIUM CHLORIDE 0.9% FLUSH
10.0000 mL | Freq: Once | INTRAVENOUS | Status: AC
  Administered 2020-04-12: 10 mL via INTRAVENOUS

## 2020-04-12 MED ORDER — CYANOCOBALAMIN 1000 MCG/ML IJ SOLN
1000.0000 ug | INTRAMUSCULAR | 12 refills | Status: DC
Start: 2020-04-12 — End: 2021-09-01

## 2020-04-12 MED ORDER — MISC. DEVICES MISC: 99 refills | Status: DC

## 2020-04-12 MED ORDER — CYANOCOBALAMIN 1000 MCG/ML IJ SOLN
1000.0000 ug | Freq: Once | INTRAMUSCULAR | Status: AC
  Administered 2020-04-12: 1000 ug via INTRAMUSCULAR
  Filled 2020-04-12: qty 1

## 2020-04-12 MED ORDER — HEPARIN SOD (PORK) LOCK FLUSH 100 UNIT/ML IV SOLN
500.0000 [IU] | Freq: Once | INTRAVENOUS | Status: AC
  Administered 2020-04-12: 500 [IU] via INTRAVENOUS

## 2020-04-12 NOTE — Addendum Note (Signed)
Addended by: Thurnell Garbe on: 04/12/2020 02:42 PM   Modules accepted: Orders

## 2020-04-12 NOTE — Patient Instructions (Signed)
[place] at [facility] Discharge Instructions  You were seen today by Dr. Delton Coombes. He went over your recent results. You received your vitamin B12 injection today; you will be prescribed vitamin B12 injections to take at home every month. You will be scheduled for a CT scan of your chest before your next visit. Dr. Delton Coombes will see you back in 3 months for labs and follow up.   Thank you for choosing [place] at [facility] to provide your oncology and hematology care.  To afford each patient quality time with our provider, please arrive at least 15 minutes before your scheduled appointment time.   If you have a lab appointment with the [facility] please come in thru the Main Entrance and check in at the main information desk  You need to re-schedule your appointment should you arrive 10 or more minutes late.  We strive to give you quality time with our providers, and arriving late affects you and other patients whose appointments are after yours.  Also, if you no show three or more times for appointments you may be dismissed from the clinic at the providers discretion.     Again, thank you for choosing [facility].  Our hope is that these requests will decrease the amount of time that you wait before being seen by our physicians.       _____________________________________________________________  Should you have questions after your visit to [facility], please contact our office at [phone] between the hours of 8:00 a.m. and 4:30 p.m.  Voicemails left after 4:00 p.m. will not be returned until the following business day.  For prescription refill requests, have your pharmacy contact our office and allow 72 hours.    Cancer Center Support Programs:   > Cancer Support Group  2nd Tuesday of the month 1pm-2pm, Journey Room

## 2020-04-12 NOTE — Progress Notes (Signed)
[name], [place] [address]   CLINIC:  Medical Oncology/Hematology  PCP:  Monico Blitz, [place] [address] [phone]   REASON FOR VISIT:  Follow-up for right lung cancer  PRIOR THERAPY:  1. Right lower lobectomy on 12/30/2015. 2. Carboplatin and pemetrexed x 6 cycles to 05/31/2016. 3. XRT completed on 08/01/2016. 4. Consolidation durvalumab from 09/04/2016 to 08/28/2017.  NGS Results: Not done  CURRENT THERAPY: Observation  BRIEF ONCOLOGIC HISTORY:  Oncology History  Adenocarcinoma of right lung, stage 3 ([facility])  11/10/2015 Imaging   13 mm spiculated lesion seen in right middle lobe.   11/30/2015 PET scan   Spiculated right middle lobe nodule is mildly hypermetabolic and most consistent with a stage IA adenocarcinoma.   12/30/2015 Surgery   Right middle lobectomy and node dissection   12/30/2015 Procedure   Right video-assisted thoracoscopy, Thoracoscopic right middle lobectomy, Mediastinal lymph node dissection, and On-Q local anesthetic catheter placement.   01/02/2016 Pathology Results   1. Lung, resection (segmental or lobe), Right Middle Lobe - INVASIVE ADENOCARCINOMA, WELL DIFFERENTIATED, SPANNING 1.2 CM. - THE SURGICAL RESECTION MARGINS ARE NEGATIVE FOR CARCINOMA. 2. Lymph node, biopsy, Level 12 - METASTATIC CARCINOMA IN 1 OF 1 LYMPH NODE (1/1). 3. Lymph node, biopsy, Level 12 #2 - METASTATIC CARCINOMA IN 1 OF 1 LYMPH NODE (1/1). 4. Lymph node, biopsy, Level 7 - METASTATIC CARCINOMA IN 1 OF 1 LYMPH NODE (1/1/). 5. Lymph node, biopsy, Level 7 #2 - METASTATIC CARCINOMA IN 1 OF 1 LYMPH NODE (1/1). 6. Lymph node, biopsy, Level 7 #3 - METASTATIC CARCINOMA IN 1 OF 1 LYMPH NODE (1/1). 7. Lymph node, biopsy, Level 7 #4 - METASTATIC CARCINOMA IN 1 OF 1 LYMPH NODE (1/1). 8. Lymph node, biopsy, 4R - THERE IS NO EVIDENCE OF CARCINOMA IN 1 OF 1 LYMPH NODE (0/1). 9. Lymph node, biopsy, 4R #2 - THERE IS NO EVIDENCE  OF CARCINOMA IN 1 OF 1 LYMPH NODE (0/1).   01/05/2016 Pathology Results   PDL1 NEGATIVE- tumor proportion score of 0%.    01/05/2016 Pathology Results   Genomic alterations identified: BRAF V600E, KIT amplification, PDGFRA amplification, CDKN2A/B loss, TP53 S57f*33.  Additional findings: MSI-STABLE.  No reportable alterations identified: EGFR, KRAS, ALK, MET, RET, ERBB2, ROS1   02/09/2016 Procedure   Port placed by IR.   02/16/2016 - 05/31/2016 Chemotherapy   Carboplatin/Pemetrexed x 6 cycles     - 08/01/2016 Radiation Therapy   [place], [place]   08/28/2016 Imaging   CT CAP- No evidence of recurrent or metastatic carcinoma within the chest, abdomen, or pelvis.  Stable incidental findings include a absent, small benign right adrenal adenoma, sigmoid diverticulosis, and aortic atherosclerosis.   09/04/2016 -  Chemotherapy   Imfinzi immunotherapy up to 52 weeks.    12/07/2016 Imaging   CT C/A/P: New focal streaky opacities within the peripheral posterior right lower lobe and anteromedial left upper lobe- likely atelectasis with infection or other process is less likely. Recommend attention to these areas on future scans.  Small right pleural effusion, slightly increased from 08/28/2016. No evidence of pleural mass or enhancement.  No evidence of malignancy/ metastatic disease within the abdomen or pelvis.    02/12/2017 Imaging   CT chest: IMPRESSION: 1. Stable CT of the chest. No specific findings identified to suggest residual or recurrence of tumor. 2. Persistent right pleural effusion. 3. Paramediastinal radiation change predominantly involving the right lung is similar to previous study. 4. Stable right  adrenal gland nodule and low-density foci within the liver. 5. Aortic Atherosclerosis (ICD10-I70.0) and Emphysema (ICD10-J43.9). Multi vessel coronary artery calcifications noted.     CANCER STAGING: Cancer Staging Adenocarcinoma of right lung, stage 3 (HCC) Staging  form: Lung, AJCC 7th Edition - Clinical stage from 12/09/2015: Stage IA (T1a, N0, M0) - Signed by Baird Cancer, PA-C on 12/09/2015 - Pathologic stage from 01/20/2016: Stage IIIA (T1a, N2, cM0) - Signed by Baird Cancer, PA-C on 02/01/2016   INTERVAL HISTORY:  [name], a 86 y.o. male, returns for routine follow-up of his right lung cancer. [name] was last seen on 01/07/2020.   Today he is accompanied by his daughter-in-law. He reports feeling fair. He reports that he developed chest pain radiating to his arm and neck this morning after taking Mobic and lying back down, along with feeling clammy and palpitating which has never happened before. He also has runny nose and chest congestion and was taken to [place] and was started on antibiotics and guaifenesin. He continues having cough with yellow sputum which is improving. He is taking Ambien for sleep and has woken up for the past 3 days completely confused. He has a soft cast on his left foot for arthritis and has trouble ambulating. He complains of difficulty starting urination and reports incomplete emptying. He takes Protonix for GERD. He has never had an MI. He denies having melena, hematochezia or hematuria. He has not taken calcium or potassium for the past 4-5 months.   REVIEW OF SYSTEMS:  Review of Systems  Constitutional: Positive for appetite change (50%) and fatigue (depleted).  Respiratory: Positive for cough (thick sputum).   Cardiovascular: Positive for chest pain (in AM).  Gastrointestinal: Positive for constipation. Negative for blood in stool.  Genitourinary: Positive for difficulty urinating (hard to start stream) and dysuria. Negative for hematuria.   Neurological: Positive for dizziness and headaches.  Psychiatric/Behavioral: Positive for confusion (last few days) and depression. The patient is nervous/anxious.   All other systems reviewed and are negative.   PAST MEDICAL/SURGICAL HISTORY:  Past Medical History:   Diagnosis Date  . Adenocarcinoma of lung, stage 3 (HCC)    Stage IIIA  . Adenocarcinoma of right lung, stage 3 ([facility]) 12/02/2015  . Arthritis   . Atrial fibrillation, transient ([facility])    transient postop, < 24 hours  . B12 deficiency   . Cyst of scrotum   . Diverticulitis   . GERD (gastroesophageal reflux disease)   . Hernia   . History of pneumonia   . Hyperlipidemia   . Lung nodule    Spiculated right middle lobe nodule, hypermetabolic  . Stroke ([facility])   . Type 2 diabetes mellitus ([facility])    Past Surgical History:  Procedure Laterality Date  . BACK SURGERY    . CHOLECYSTECTOMY    . COLONOSCOPY N/A 11/24/2015   Procedure: COLONOSCOPY;  Surgeon: Rogene Houston, MD;  Location: [facility];  Service: Endoscopy;  Laterality: N/A;  730  . COLONOSCOPY W/ POLYPECTOMY     x 5  . CYST EXCISION     Scrotum  . EYE SURGERY Bilateral    Cataract with Lens  . HERNIA REPAIR Right    Inguinal  . IR GENERIC HISTORICAL  02/09/2016   IR US GUIDE VASC ACCESS RIGHT 02/09/2016 Arne Cleveland, MD [facility]  . IR GENERIC HISTORICAL  02/09/2016   IR FLUORO GUIDE CV LINE RIGHT 02/09/2016 Arne Cleveland, MD [facility]  . LUMBAR DISC SURGERY  per patient report  . PORTACATH PLACEMENT    . VIDEO ASSISTED THORACOSCOPY (VATS)/ LOBECTOMY Right 12/30/2015   Procedure: VIDEO ASSISTED THORACOSCOPY (VATS)/RIGHT MIDDLE LOBECTOMY;  Surgeon: Melrose Nakayama, MD;  Location: [facility];  Service: Thoracic;  Laterality: Right;    SOCIAL HISTORY:  Social History   Socioeconomic History  . Marital status: Married    Spouse name: Not on file  . Number of children: Not on file  . Years of education: Not on file  . Highest education level: Not on file  Occupational History  . Not on file  Tobacco Use  . Smoking status: Former Smoker    Types: Cigarettes    Quit date: 05/27/2004    Years since quitting: 15.8  . Smokeless tobacco: Never Used  Vaping Use  . Vaping Use: Never used  Substance and  Sexual Activity  . Alcohol use: No    Alcohol/week: 0.0 standard drinks  . Drug use: No  . Sexual activity: Not on file    Comment: married  Other Topics Concern  . Not on file  Social History Narrative  . Not on file   Social Determinants of Health   Financial Resource Strain:   . Difficulty of Paying Living Expenses: Not on file  Food Insecurity:   . Worried About Charity fundraiser in the Last Year: Not on file  . Ran Out of Food in the Last Year: Not on file  Transportation Needs:   . Lack of Transportation (Medical): Not on file  . Lack of Transportation (Non-Medical): Not on file  Physical Activity:   . Days of Exercise per Week: Not on file  . Minutes of Exercise per Session: Not on file  Stress:   . Feeling of Stress : Not on file  Social Connections:   . Frequency of Communication with Friends and Family: Not on file  . Frequency of Social Gatherings with Friends and Family: Not on file  . Attends Religious Services: Not on file  . Active Member of Clubs or Organizations: Not on file  . Attends Archivist Meetings: Not on file  . Marital Status: Not on file  Intimate Partner Violence:   . Fear of Current or Ex-Partner: Not on file  . Emotionally Abused: Not on file  . Physically Abused: Not on file  . Sexually Abused: Not on file    FAMILY HISTORY:  Family History  Problem Relation Age of Onset  . Colon cancer Brother     CURRENT MEDICATIONS:  Current Outpatient Medications  Medication Sig Dispense Refill  . ACCU-CHEK AVIVA PLUS test strip USE 5 STRIPS DAILY TO MONITOR FLUCTUATING BLOOD SUGAR  4  . ACCU-CHEK FASTCLIX LANCETS MISC USE 6 LANCETS DAILY  5  . aspirin EC 81 MG tablet Take 81 mg by mouth daily.    . benzonatate (TESSALON) 100 MG capsule Take by mouth 3 (three) times daily as needed for cough.    . calcium acetate, Phos Binder, (PHOSLYRA) 667 MG/5ML SOLN Take 1,334 mg by mouth 3 (three) times daily with meals. (Patient not taking:  Reported on 04/12/2020)    . calcium carbonate (OS-CAL - DOSED IN MG OF ELEMENTAL CALCIUM) 1250 (500 Ca) MG tablet Take 1 tablet (500 mg of elemental calcium total) by mouth daily with breakfast. (Patient not taking: Reported on 04/12/2020) 30 tablet 2  . Cholecalciferol (VITAMIN D) 2000 UNITS CAPS Take 1 capsule by mouth daily.     . cyanocobalamin (,VITAMIN B-12,) 1000  MCG/ML injection Inject 1 mL (1,000 mcg total) into the muscle every 30 (thirty) days. 1 mL 12  . gabapentin (NEURONTIN) 100 MG capsule Take 100 mg by mouth at bedtime.    Marland Kitchen glipiZIDE (GLUCOTROL) 5 MG tablet Take 1 tablet (5 mg total) by mouth 2 (two) times daily before a meal. (Patient taking differently: Take 2.5 mg by mouth 2 (two) times daily before a meal. ) 60 tablet 1  . lidocaine-prilocaine (EMLA) cream Apply a quarter size amount to port site 1 hour prior to chemo. Do not rub in. Cover with plastic wrap. (Patient not taking: Reported on 01/07/2020) 30 g 3  . lisinopril (ZESTRIL) 5 MG tablet Take 5 mg by mouth daily.    Marland Kitchen loratadine (CLARITIN) 10 MG tablet Take 10 mg by mouth daily.    . meclizine (ANTIVERT) 25 MG tablet Take 25 mg by mouth 3 (three) times daily.     . meloxicam (MOBIC) 15 MG tablet Take 15 mg by mouth daily.    . pantoprazole (PROTONIX) 40 MG tablet Take 40 mg by mouth 2 (two) times daily.     . polyethylene glycol (MIRALAX / GLYCOLAX) 17 g packet Take 17 g by mouth daily.    . potassium chloride SA (K-DUR,KLOR-CON) 20 MEQ tablet Take 1 tablet (20 mEq total) by mouth 2 (two) times daily. (Patient not taking: Reported on 04/12/2020) 6 tablet 0  . simvastatin (ZOCOR) 40 MG tablet Take 40 mg by mouth at bedtime.     Marland Kitchen terazosin (HYTRIN) 5 MG capsule Take 5 mg by mouth at bedtime.     No current facility-administered medications for this visit.   Facility-Administered Medications Ordered in Other Visits  Medication Dose Route Frequency Provider Last Rate Last Admin  . cyanocobalamin ((VITAMIN B-12)) injection  1,000 mcg  1,000 mcg Intramuscular Once Higgs, Vetta, MD      . heparin lock flush 100 unit/mL  500 Units Intravenous Once Derek Jack, MD      . sodium chloride flush (NS) 0.9 % injection 10 mL  10 mL Intravenous PRN Derek Jack, MD        ALLERGIES:  Allergies  Allergen Reactions  . Morphine And Related Other (See Comments)    confusion    PHYSICAL EXAM:  Performance status (ECOG): 1 - Symptomatic but completely ambulatory  Vitals:   04/12/20 1358  BP: 128/71  Pulse: 92  Resp: 18  Temp: (!) 96.9 F (36.1 C)  SpO2: 93%   Wt Readings from Last 3 Encounters:  04/12/20 199 lb 6.4 oz (90.4 kg)  02/29/20 201 lb 11.2 oz (91.5 kg)  01/07/20 (!) 204 lb 11.2 oz (92.9 kg)   Physical Exam Vitals reviewed.  Constitutional:      Appearance: Normal appearance.  Cardiovascular:     Rate and Rhythm: Normal rate and regular rhythm.     Pulses: Normal pulses.     Heart sounds: Normal heart sounds.  Pulmonary:     Effort: Pulmonary effort is normal.     Breath sounds: Normal breath sounds.  Musculoskeletal:     Right lower leg: No edema.     Left lower leg: No edema.  Neurological:     General: No focal deficit present.     Mental Status: He is alert and oriented to person, place, and time.  Psychiatric:        Mood and Affect: Mood normal.        Behavior: Behavior normal.  LABORATORY DATA:  I have reviewed the labs as listed.  CBC Latest Ref Rng & Units 04/12/2020 12/31/2019 09/24/2019  WBC 4.0 - 10.5 K/uL 8.3 8.1 7.3  Hemoglobin 13.0 - 17.0 g/dL 12.9(L) 12.7(L) 13.1  Hematocrit 39 - 52 % 39.4 38.7(L) 40.2  Platelets 150 - 400 K/uL 304 256 255   CMP Latest Ref Rng & Units 12/31/2019 09/24/2019 06/29/2019  Glucose 70 - 99 mg/dL 156(H) 240(H) 106(H)  BUN 8 - 23 mg/dL _0 Creatinine 0.61 - 1.24 mg/dL 1.04 1.06 0.98  Sodium 135 - 145 mmol/L 136 136 140  Potassium 3.5 - 5.1 mmol/L 3.8 3.9 3.5  Chloride 98 - 111 mmol/L 104 101 106  CO2 22 - 32 mmol/L  _1 Calcium 8.9 - 10.3 mg/dL 8.6(L) 8.9 8.5(L)  Total Protein 6.5 - 8.1 g/dL 6.7 6.2(L) 6.0(L)  Total Bilirubin 0.3 - 1.2 mg/dL 0.6 0.7 0.5  Alkaline Phos 38 - 126 U/L 70 62 62  AST 15 - 41 U/L _2 ALT 0 - 44 U/L _3 DIAGNOSTIC IMAGING:  I have independently reviewed the scans and discussed with the patient. No results found.   ASSESSMENT:  1.  Stage III right lung adenocarcinoma: -Diagnosed in June 2017, status post right lower lobectomy, VATS with mediastinal lymph node sampling, PD-L1 negative. -6 cycles of carboplatin and pemetrexed completed on 05/31/2016, XRT completed on 08/01/2016. -Consolidation durvalumab from 09/04/2016 through 08/28/2017. -CT chest on 12/31/2019 showed stable radiation fibrosis in the right perihilar lung.  No evidence of local tumor recurrence.  Enlarging sclerotic lesion in the lower sternum, cannot exclude sclerotic bone metastasis.  No evidence of metastatic disease in the chest.  Trace dependent right pleural effusion has decreased.  Stable at static 4.1 cm ascending thoracic aorta.  Stable right adrenal adenoma.   PLAN:  1.  Stage III right lung adenocarcinoma: -He is accompanied by his daughter-in-law who is a Marine scientist in [employer] health system. -He is reportedly recovering from a respiratory infection.  Shortness of breath is improving. -I have recommended repeating CT chest with contrast in January. -He stopped taking potassium and calcium supplements.  Potassium today 3.6 and calcium is 8.4, albumin 3.0.  Last PSA was 1.88 on 01/07/2020. -He reportedly had one episode of chest pain few days ago when he was lying in the bed.  Not clear if it is acid reflux related.  If it happens again, he was told to go to the ER to get evaluated.  2.  Iron deficiency anemia: -He received Feraheme in the past. -Last ferritin was 108 on 01/07/2020.  Hemoglobin today is 12.9.  3.  Vitamin B12 deficiency: -He is continuing B12 shots in our clinic.  We  will send prescription for syringes so that his daughter-in-law can give it to him at home.   Orders placed this encounter:  No orders of the defined types were placed in this encounter.    Derek Jack, MD [place] [phone]   I, Milinda Antis, am acting as a scribe for Dr. Sanda Linger.  I, Derek Jack MD, have reviewed the above documentation for accuracy and completeness, and I agree with the above.

## 2020-04-12 NOTE — Progress Notes (Signed)
Patient tolerated B12 injection with no complaints voiced.  Site clean and dry with no bruising or swelling noted at site.  Band aid applied.  VSS with discharge and left ambulatory with no s/s of distress noted.

## 2020-07-13 ENCOUNTER — Ambulatory Visit (HOSPITAL_COMMUNITY)
Admission: RE | Admit: 2020-07-13 | Discharge: 2020-07-13 | Disposition: A | Discharge status: Home / Self Care | Payer: Medicare Other | Source: Ambulatory Visit | Attending: Hematology | Admitting: Hematology

## 2020-07-13 ENCOUNTER — Inpatient Hospital Stay (HOSPITAL_COMMUNITY): Payer: Medicare Other | Attending: Hematology

## 2020-07-13 ENCOUNTER — Other Ambulatory Visit: Payer: Self-pay

## 2020-07-13 VITALS — BP 133/60 | HR 81 | Temp 97.6°F | Resp 20

## 2020-07-13 DIAGNOSIS — I4891 Unspecified atrial fibrillation: Secondary | ICD-10-CM | POA: Diagnosis not present

## 2020-07-13 DIAGNOSIS — C779 Secondary and unspecified malignant neoplasm of lymph node, unspecified: Secondary | ICD-10-CM | POA: Insufficient documentation

## 2020-07-13 DIAGNOSIS — C3431 Malignant neoplasm of lower lobe, right bronchus or lung: Secondary | ICD-10-CM | POA: Insufficient documentation

## 2020-07-13 DIAGNOSIS — E538 Deficiency of other specified B group vitamins: Secondary | ICD-10-CM | POA: Diagnosis not present

## 2020-07-13 DIAGNOSIS — E119 Type 2 diabetes mellitus without complications: Secondary | ICD-10-CM | POA: Diagnosis not present

## 2020-07-13 DIAGNOSIS — C3491 Malignant neoplasm of unspecified part of right bronchus or lung: Secondary | ICD-10-CM

## 2020-07-13 DIAGNOSIS — D509 Iron deficiency anemia, unspecified: Secondary | ICD-10-CM | POA: Insufficient documentation

## 2020-07-13 DIAGNOSIS — R519 Headache, unspecified: Secondary | ICD-10-CM | POA: Insufficient documentation

## 2020-07-13 DIAGNOSIS — R3912 Poor urinary stream: Secondary | ICD-10-CM | POA: Diagnosis not present

## 2020-07-13 DIAGNOSIS — Z95828 Presence of other vascular implants and grafts: Secondary | ICD-10-CM

## 2020-07-13 DIAGNOSIS — Z87891 Personal history of nicotine dependence: Secondary | ICD-10-CM | POA: Insufficient documentation

## 2020-07-13 LAB — COMPREHENSIVE METABOLIC PANEL
ALT: 27 U/L (ref 0–44)
AST: 26 U/L (ref 15–41)
Albumin: 3 g/dL — ABNORMAL LOW (ref 3.5–5.0)
Alkaline Phosphatase: 67 U/L (ref 38–126)
Anion gap: 10 (ref 5–15)
BUN: 22 mg/dL (ref 8–23)
CO2: 25 mmol/L (ref 22–32)
Calcium: 8.3 mg/dL — ABNORMAL LOW (ref 8.9–10.3)
Chloride: 104 mmol/L (ref 98–111)
Creatinine, Ser: 1.22 mg/dL (ref 0.61–1.24)
GFR, Estimated: 57 mL/min — ABNORMAL LOW (ref >60)
Glucose, Bld: 162 mg/dL — ABNORMAL HIGH (ref 70–99)
Potassium: 3.7 mmol/L (ref 3.5–5.1)
Sodium: 139 mmol/L (ref 135–145)
Total Bilirubin: 0.5 mg/dL (ref 0.3–1.2)
Total Protein: 6 g/dL — ABNORMAL LOW (ref 6.5–8.1)

## 2020-07-13 LAB — IRON AND TIBC
Iron: 89 ug/dL (ref 45–182)
Saturation Ratios: 35 % (ref 17.9–39.5)
TIBC: 255 ug/dL (ref 250–450)
UIBC: 166 ug/dL

## 2020-07-13 LAB — CBC WITH DIFFERENTIAL/PLATELET
Abs Immature Granulocytes: 0.02 10*3/uL (ref 0.00–0.07)
Basophils Absolute: 0 10*3/uL (ref 0.0–0.1)
Basophils Relative: 0 %
Eosinophils Absolute: 0.2 10*3/uL (ref 0.0–0.5)
Eosinophils Relative: 3 %
HCT: 37.9 % — ABNORMAL LOW (ref 39.0–52.0)
Hemoglobin: 12.6 g/dL — ABNORMAL LOW (ref 13.0–17.0)
Immature Granulocytes: 0 %
Lymphocytes Relative: 24 %
Lymphs Abs: 1.6 10*3/uL (ref 0.7–4.0)
MCH: 31.3 pg (ref 26.0–34.0)
MCHC: 33.2 g/dL (ref 30.0–36.0)
MCV: 94.3 fL (ref 80.0–100.0)
Monocytes Absolute: 0.6 10*3/uL (ref 0.1–1.0)
Monocytes Relative: 9 %
Neutro Abs: 4.3 10*3/uL (ref 1.7–7.7)
Neutrophils Relative %: 64 %
Platelets: 239 10*3/uL (ref 150–400)
RBC: 4.02 MIL/uL — ABNORMAL LOW (ref 4.22–5.81)
RDW: 13.2 % (ref 11.5–15.5)
WBC: 6.8 10*3/uL (ref 4.0–10.5)
nRBC: 0 % (ref 0.0–0.2)

## 2020-07-13 LAB — PSA: Prostatic Specific Antigen: 1.41 ng/mL (ref 0.00–4.00)

## 2020-07-13 LAB — FERRITIN: Ferritin: 96 ng/mL (ref 24–336)

## 2020-07-13 MED ORDER — SODIUM CHLORIDE 0.9% FLUSH
10.0000 mL | INTRAVENOUS | Status: DC | PRN
  Administered 2020-07-13: 10 mL via INTRAVENOUS

## 2020-07-13 MED ORDER — HEPARIN SOD (PORK) LOCK FLUSH 100 UNIT/ML IV SOLN
500.0000 [IU] | Freq: Once | INTRAVENOUS | Status: AC
  Administered 2020-07-13: 500 [IU] via INTRAVENOUS

## 2020-07-13 NOTE — Progress Notes (Signed)
Patient presented to the clinic today for port flush and labs.  Patient tolerated procedure well.  Patient discharged in stable condition.  Return for follow up.

## 2020-07-20 ENCOUNTER — Other Ambulatory Visit: Payer: Self-pay

## 2020-07-20 ENCOUNTER — Inpatient Hospital Stay (HOSPITAL_BASED_OUTPATIENT_CLINIC_OR_DEPARTMENT_OTHER): Payer: Medicare Other | Admitting: Hematology

## 2020-07-20 VITALS — BP 124/53 | HR 84 | Temp 97.8°F | Resp 16 | Wt 193.8 lb

## 2020-07-20 DIAGNOSIS — C3431 Malignant neoplasm of lower lobe, right bronchus or lung: Secondary | ICD-10-CM | POA: Diagnosis not present

## 2020-07-20 DIAGNOSIS — C3491 Malignant neoplasm of unspecified part of right bronchus or lung: Secondary | ICD-10-CM

## 2020-07-20 NOTE — Patient Instructions (Signed)
[employer] [facility] Discharge Instructions  You were seen today by Dr. Delton Coombes. He went over your recent results and scans. You will be scheduled for a CT scan of your chest before your next visit. Dr. Delton Coombes will see you back in 6 months for labs and follow up.   Thank you for choosing [facility] at [facility] to provide your oncology and hematology care.  To afford each patient quality time with our provider, please arrive at least 15 minutes before your scheduled appointment time.   If you have a lab appointment with the [facility] please come in thru the Main Entrance and check in at the main information desk  You need to re-schedule your appointment should you arrive 10 or more minutes late.  We strive to give you quality time with our providers, and arriving late affects you and other patients whose appointments are after yours.  Also, if you no show three or more times for appointments you may be dismissed from the clinic at the providers discretion.     Again, thank you for choosing [facility].  Our hope is that these requests will decrease the amount of time that you wait before being seen by our physicians.       _____________________________________________________________  Should you have questions after your visit to [facility], please contact our office at [phone] between the hours of 8:00 a.m. and 4:30 p.m.  Voicemails left after 4:00 p.m. will not be returned until the following business day.  For prescription refill requests, have your pharmacy contact our office and allow 72 hours.    Cancer Center Support Programs:   > Cancer Support Group  2nd Tuesday of the month 1pm-2pm, Journey Room

## 2020-07-20 NOTE — Progress Notes (Signed)
[name], [place] [id number]   CLINIC:  Medical Oncology/Hematology  PCP:  Monico Blitz, [address] [place] [address] [phone]   REASON FOR VISIT:  Follow-up for right lung cancer  PRIOR THERAPY:  1. Right lower lobectomy on 12/30/2015. 2. Carboplatin and pemetrexed x 6 cycles from 02/16/2016 to 05/31/2016. 3. XRT completed on 08/01/2016. 4. Consolidation durvalumab from 09/04/2016 to 08/28/2017.  NGS Results: Not done  CURRENT THERAPY: Observation  BRIEF ONCOLOGIC HISTORY:  Oncology History  Adenocarcinoma of right lung, stage 3 ([facility])  11/10/2015 Imaging   13 mm spiculated lesion seen in right middle lobe.   11/30/2015 PET scan   Spiculated right middle lobe nodule is mildly hypermetabolic and most consistent with a stage IA adenocarcinoma.   12/30/2015 Surgery   Right middle lobectomy and node dissection   12/30/2015 Procedure   Right video-assisted thoracoscopy, Thoracoscopic right middle lobectomy, Mediastinal lymph node dissection, and On-Q local anesthetic catheter placement.   01/02/2016 Pathology Results   1. Lung, resection (segmental or lobe), Right Middle Lobe - INVASIVE ADENOCARCINOMA, WELL DIFFERENTIATED, SPANNING 1.2 CM. - THE SURGICAL RESECTION MARGINS ARE NEGATIVE FOR CARCINOMA. 2. Lymph node, biopsy, Level 12 - METASTATIC CARCINOMA IN 1 OF 1 LYMPH NODE (1/1). 3. Lymph node, biopsy, Level 12 #2 - METASTATIC CARCINOMA IN 1 OF 1 LYMPH NODE (1/1). 4. Lymph node, biopsy, Level 7 - METASTATIC CARCINOMA IN 1 OF 1 LYMPH NODE (1/1/). 5. Lymph node, biopsy, Level 7 #2 - METASTATIC CARCINOMA IN 1 OF 1 LYMPH NODE (1/1). 6. Lymph node, biopsy, Level 7 #3 - METASTATIC CARCINOMA IN 1 OF 1 LYMPH NODE (1/1). 7. Lymph node, biopsy, Level 7 #4 - METASTATIC CARCINOMA IN 1 OF 1 LYMPH NODE (1/1). 8. Lymph node, biopsy, 4R - THERE IS NO EVIDENCE OF CARCINOMA IN 1 OF 1 LYMPH NODE (0/1). 9. Lymph node, biopsy, 4R #2 - THERE  IS NO EVIDENCE OF CARCINOMA IN 1 OF 1 LYMPH NODE (0/1).   01/05/2016 Pathology Results   PDL1 NEGATIVE- tumor proportion score of 0%.    01/05/2016 Pathology Results   Genomic alterations identified: BRAF V600E, KIT amplification, PDGFRA amplification, CDKN2A/B loss, TP53 S65f*33.  Additional findings: MSI-STABLE.  No reportable alterations identified: EGFR, KRAS, ALK, MET, RET, ERBB2, ROS1   02/09/2016 Procedure   Port placed by IR.   02/16/2016 - 05/31/2016 Chemotherapy   Carboplatin/Pemetrexed x 6 cycles     - 08/01/2016 Radiation Therapy   [place],    08/28/2016 Imaging   CT CAP- No evidence of recurrent or metastatic carcinoma within the chest, abdomen, or pelvis.  Stable incidental findings include a absent, small benign right adrenal adenoma, sigmoid diverticulosis, and aortic atherosclerosis.   09/04/2016 -  Chemotherapy   Imfinzi immunotherapy up to 52 weeks.    12/07/2016 Imaging   CT C/A/P: New focal streaky opacities within the peripheral posterior right lower lobe and anteromedial left upper lobe- likely atelectasis with infection or other process is less likely. Recommend attention to these areas on future scans.  Small right pleural effusion, slightly increased from 08/28/2016. No evidence of pleural mass or enhancement.  No evidence of malignancy/ metastatic disease within the abdomen or pelvis.    02/12/2017 Imaging   CT chest: IMPRESSION: 1. Stable CT of the chest. No specific findings identified to suggest residual or recurrence of tumor. 2. Persistent right pleural effusion. 3. Paramediastinal radiation change predominantly involving the right lung is similar to previous study. 4.  Stable right adrenal gland nodule and low-density foci within the liver. 5. Aortic Atherosclerosis (ICD10-I70.0) and Emphysema (ICD10-J43.9). Multi vessel coronary artery calcifications noted.     CANCER STAGING: Cancer Staging Adenocarcinoma of right lung, stage 3  (HCC) Staging form: Lung, AJCC 7th Edition - Clinical stage from 12/09/2015: Stage IA (T1a, N0, M0) - Signed by Baird Cancer, PA-C on 12/09/2015 - Pathologic stage from 01/20/2016: Stage IIIA (T1a, N2, cM0) - Signed by Baird Cancer, PA-C on 02/01/2016   INTERVAL HISTORY:  [name], a 87 y.o. male, returns for routine follow-up of his right lung cancer. Xiong was last seen on 04/12/2020.   Today he is accompanied by his son and he reports feeling fair. On evening of 02/06 he was complaining of having a headache/upper neck and elevated blood pressure and was taken to [facility] in [place]. He has been having headaches for the past 6 weeks; his daughter-in-law checks his BP at home regularly. He is taking lisinopril daily. He has also been complaining of right axillary and right shoulder pain for the past 2-3 weeks since falling in his tub.   REVIEW OF SYSTEMS:  Review of Systems  Constitutional: Positive for fatigue (25%). Negative for appetite change.  Gastrointestinal: Positive for constipation.  Musculoskeletal: Positive for arthralgias (5/10 R shoulder and axillary pain), back pain (R upper back pain) and neck pain (upper neck pain).  Neurological: Positive for dizziness.  All other systems reviewed and are negative.   PAST MEDICAL/SURGICAL HISTORY:  Past Medical History:  Diagnosis Date  . Adenocarcinoma of lung, stage 3 (HCC)    Stage IIIA  . Adenocarcinoma of right lung, stage 3 ([facility]) 12/02/2015  . Arthritis   . Atrial fibrillation, transient ([facility])    transient postop, < 24 hours  . B12 deficiency   . Cyst of scrotum   . Diverticulitis   . GERD (gastroesophageal reflux disease)   . Hernia   . History of pneumonia   . Hyperlipidemia   . Lung nodule    Spiculated right middle lobe nodule, hypermetabolic  . Stroke ([facility])   . Type 2 diabetes mellitus ([facility])    Past Surgical History:  Procedure Laterality Date  . BACK SURGERY    . CHOLECYSTECTOMY     . COLONOSCOPY N/A 11/24/2015   Procedure: COLONOSCOPY;  Surgeon: Rogene Houston, MD;  Location[facility];  Service: Endoscopy;  Laterality: N/A;  730  . COLONOSCOPY W/ POLYPECTOMY     x 5  . CYST EXCISION     Scrotum  . EYE SURGERY Bilateral    Cataract with Lens  . HERNIA REPAIR Right    Inguinal  . IR GENERIC HISTORICAL  02/09/2016   IR US GUIDE VASC ACCESS RIGHT 02/09/2016 Arne Cleveland, MD [facility]  . IR GENERIC HISTORICAL  02/09/2016   IR FLUORO GUIDE CV LINE RIGHT 02/09/2016 Arne Cleveland, MD [facility]  . LUMBAR DISC SURGERY     per patient report  . PORTACATH PLACEMENT    . VIDEO ASSISTED THORACOSCOPY (VATS)/ LOBECTOMY Right 12/30/2015   Procedure: VIDEO ASSISTED THORACOSCOPY (VATS)/RIGHT MIDDLE LOBECTOMY;  Surgeon: Melrose Nakayama, MD;  Location: [facility];  Service: Thoracic;  Laterality: Right;    SOCIAL HISTORY:  Social History   Socioeconomic History  . Marital status: Married    Spouse name: Not on file  . Number of children: Not on file  . Years of education: Not on file  . Highest education level: Not  on file  Occupational History  . Not on file  Tobacco Use  . Smoking status: Former Smoker    Types: Cigarettes    Quit date: 05/27/2004    Years since quitting: 16.1  . Smokeless tobacco: Never Used  Vaping Use  . Vaping Use: Never used  Substance and Sexual Activity  . Alcohol use: No    Alcohol/week: 0.0 standard drinks  . Drug use: No  . Sexual activity: Not on file    Comment: married  Other Topics Concern  . Not on file  Social History Narrative  . Not on file   Social Determinants of Health   Financial Resource Strain: Low Risk   . Difficulty of Paying Living Expenses: Not hard at all  Food Insecurity: No Food Insecurity  . Worried About Charity fundraiser in the Last Year: Never true  . Ran Out of Food in the Last Year: Never true  Transportation Needs: No Transportation Needs  . Lack of Transportation (Medical): No  .  Lack of Transportation (Non-Medical): No  Physical Activity: Inactive  . Days of Exercise per Week: 0 days  . Minutes of Exercise per Session: 0 min  Stress: No Stress Concern Present  . Feeling of Stress : Only a little  Social Connections: Unknown  . Frequency of Communication with Friends and Family: Three times a week  . Frequency of Social Gatherings with Friends and Family: Three times a week  . Attends Religious Services: Not on file  . Active Member of Clubs or Organizations: No  . Attends Archivist Meetings: Not on file  . Marital Status: Separated  Intimate Partner Violence: Not At Risk  . Fear of Current or Ex-Partner: No  . Emotionally Abused: No  . Physically Abused: No  . Sexually Abused: No    FAMILY HISTORY:  Family History  Problem Relation Age of Onset  . Colon cancer Brother     CURRENT MEDICATIONS:  Current Outpatient Medications  Medication Sig Dispense Refill  . ACCU-CHEK AVIVA PLUS test strip USE 5 STRIPS DAILY TO MONITOR FLUCTUATING BLOOD SUGAR  4  . ACCU-CHEK FASTCLIX LANCETS MISC USE 6 LANCETS DAILY  5  . acetaminophen (TYLENOL) 500 MG tablet Take by mouth.    Marland Kitchen aspirin EC 81 MG tablet Take 81 mg by mouth daily.    . B-D 3CC LUER-LOK SYR 25GX1" 25G X 1" 3 ML MISC Inject into the muscle.    . benzonatate (TESSALON) 100 MG capsule Take by mouth 3 (three) times daily as needed for cough.    . Bromfenac Sodium 0.07 % SOLN Place 1 drop into both eyes daily as needed (eye pain).    . calcium acetate, Phos Binder, (PHOSLYRA) 667 MG/5ML SOLN Take 1,334 mg by mouth 3 (three) times daily with meals.    . calcium carbonate (OS-CAL - DOSED IN MG OF ELEMENTAL CALCIUM) 1250 (500 Ca) MG tablet Take 1 tablet (500 mg of elemental calcium total) by mouth daily with breakfast. 30 tablet 2  . Carboxymethylcellulose Sodium 0.25 % SOLN Place 1 drop into both eyes daily as needed (dry eyes).    . Cholecalciferol (VITAMIN D) 2000 UNITS CAPS Take 1 capsule by mouth  daily.     . cyanocobalamin (,VITAMIN B-12,) 1000 MCG/ML injection Inject 1 mL (1,000 mcg total) into the muscle every 30 (thirty) days. 1 mL 12  . gabapentin (NEURONTIN) 100 MG capsule Take 100 mg by mouth at bedtime.    Marland Kitchen glipiZIDE (  GLUCOTROL) 5 MG tablet Take 1 tablet (5 mg total) by mouth 2 (two) times daily before a meal. (Patient taking differently: Take 2.5 mg by mouth 2 (two) times daily before a meal.) 60 tablet 1  . lidocaine-prilocaine (EMLA) cream Apply a quarter size amount to port site 1 hour prior to chemo. Do not rub in. Cover with plastic wrap. 30 g 3  . lisinopril (ZESTRIL) 5 MG tablet Take 5 mg by mouth daily.    Marland Kitchen loratadine (CLARITIN) 10 MG tablet Take 10 mg by mouth daily.    . meclizine (ANTIVERT) 25 MG tablet Take 25 mg by mouth 3 (three) times daily.    . meloxicam (MOBIC) 15 MG tablet Take 15 mg by mouth daily.    . Misc. Devices MISC Please provide syringe and IM needle to draw up and inject his vitamin B-12 monthly. 1 each 99  . ondansetron (ZOFRAN) 8 MG tablet Take 1 tablet by mouth 3 (three) times daily.    . pantoprazole (PROTONIX) 40 MG tablet Take 40 mg by mouth 2 (two) times daily.     . potassium chloride (KLOR-CON) 10 MEQ tablet Take 2 tablets by mouth daily.    . potassium chloride SA (K-DUR,KLOR-CON) 20 MEQ tablet Take 1 tablet (20 mEq total) by mouth 2 (two) times daily. 6 tablet 0  . prochlorperazine (COMPAZINE) 10 MG tablet Take 1 tablet by mouth every 6 (six) hours as needed.    . simvastatin (ZOCOR) 40 MG tablet Take 40 mg by mouth at bedtime.     Marland Kitchen terazosin (HYTRIN) 5 MG capsule Take 5 mg by mouth at bedtime.    Marland Kitchen zolpidem (AMBIEN) 10 MG tablet TAKE ONE TABLET BY MOUTH AT BEDTIME DO NOT TAKE WITH ALPRAZOLAM     No current facility-administered medications for this visit.   Facility-Administered Medications Ordered in Other Visits  Medication Dose Route Frequency Provider Last Rate Last Admin  . heparin lock flush 100 unit/mL  500 Units Intravenous  Once Derek Jack, MD      . sodium chloride flush (NS) 0.9 % injection 10 mL  10 mL Intravenous PRN Derek Jack, MD        ALLERGIES:  Allergies  Allergen Reactions  . Morphine And Related Other (See Comments)    confusion  . Amitriptyline   . Omeprazole     Other reaction(s): Delirium  . Temazepam     Other reaction(s): Nightmares  . Zantac [Ranitidine]     Other reaction(s): Delirium    PHYSICAL EXAM:  Performance status (ECOG): 1 - Symptomatic but completely ambulatory  Vitals:   07/20/20 1546  BP: (!) 124/53  Pulse: 84  Resp: 16  Temp: 97.8 F (36.6 C)  SpO2: 95%   Wt Readings from Last 3 Encounters:  07/20/20 193 lb 12.8 oz (87.9 kg)  04/12/20 199 lb 6.4 oz (90.4 kg)  02/29/20 201 lb 11.2 oz (91.5 kg)   Physical Exam Vitals reviewed.  Constitutional:      Appearance: Normal appearance.  Cardiovascular:     Rate and Rhythm: Normal rate and regular rhythm.     Pulses: Normal pulses.     Heart sounds: Normal heart sounds.  Pulmonary:     Effort: Pulmonary effort is normal.     Breath sounds: Normal breath sounds.  Chest:  Breasts:     Right: No supraclavicular adenopathy.     Left: No supraclavicular adenopathy.    Abdominal:     Palpations: Abdomen is soft. There  is no mass.     Tenderness: There is no abdominal tenderness.  Musculoskeletal:     Cervical back: No pain with movement, spinous process tenderness or muscular tenderness.  Lymphadenopathy:     Cervical: No cervical adenopathy.     Upper Body:     Right upper body: No supraclavicular adenopathy.     Left upper body: No supraclavicular adenopathy.  Neurological:     General: No focal deficit present.     Mental Status: He is alert and oriented to person, place, and time.  Psychiatric:        Mood and Affect: Mood normal.        Behavior: Behavior normal.      LABORATORY DATA:  I have reviewed the labs as listed.  CBC Latest Ref Rng & Units 07/13/2020 04/12/2020  12/31/2019  WBC 4.0 - 10.5 K/uL 6.8 8.3 8.1  Hemoglobin 13.0 - 17.0 g/dL 12.6(L) 12.9(L) 12.7(L)  Hematocrit 39.0 - 52.0 % 37.9(L) 39.4 38.7(L)  Platelets 150 - 400 K/uL 239 304 256   CMP Latest Ref Rng & Units 07/13/2020 04/12/2020 12/31/2019  Glucose 70 - 99 mg/dL 162(H) 253(H) 156(H)  BUN 8 - 23 mg/dL '22 16 14  ' Creatinine 0.61 - 1.24 mg/dL 1.22 1.39(H) 1.04  Sodium 135 - 145 mmol/L 139 133(L) 136  Potassium 3.5 - 5.1 mmol/L 3.7 3.6 3.8  Chloride 98 - 111 mmol/L 104 102 104  CO2 22 - 32 mmol/L '25 22 23  ' Calcium 8.9 - 10.3 mg/dL 8.3(L) 8.4(L) 8.6(L)  Total Protein 6.5 - 8.1 g/dL 6.0(L) 6.1(L) 6.7  Total Bilirubin 0.3 - 1.2 mg/dL 0.5 0.7 0.6  Alkaline Phos 38 - 126 U/L 67 61 70  AST 15 - 41 U/L '26 25 25  ' ALT 0 - 44 U/L '27 17 23   ' Lab Results  Component Value Date   TIBC 255 07/13/2020   TIBC 278 01/07/2020   TIBC 280 09/24/2019   FERRITIN 96 07/13/2020   FERRITIN 108 01/07/2020   FERRITIN 114 09/24/2019   IRONPCTSAT 35 07/13/2020   IRONPCTSAT 27 01/07/2020   IRONPCTSAT 33 09/24/2019    DIAGNOSTIC IMAGING:  I have independently reviewed the scans and discussed with the patient. CT Chest Wo Contrast  Result Date: 07/13/2020 CLINICAL DATA:  Lung cancer status post surgery, chemotherapy and radiation therapy. EXAM: CT CHEST WITHOUT CONTRAST TECHNIQUE: Multidetector CT imaging of the chest was performed following the standard protocol without IV contrast. COMPARISON:  12/31/2019. FINDINGS: Cardiovascular: Right IJ Port-A-Cath terminates in low SVC. Atherosclerotic calcification of the aorta, aortic valve and coronary arteries. Ascending aorta measures 4.2 cm, stable. Heart size normal. No pericardial effusion. Mediastinum/Nodes: No pathologically enlarged mediastinal or axillary lymph nodes. Hilar regions are difficult to definitively evaluate without IV contrast. Esophagus is grossly unremarkable. Lungs/Pleura: Centrilobular and paraseptal emphysema. Postoperative and post treatment  changes in the right hemithorax with scarring and volume loss, similar. Small right pleural effusion. 7 mm subpleural nodule in the peripheral left lower lobe (4/112), stable. No left pleural fluid. Airway is otherwise unremarkable. Upper Abdomen: Visualized portion of the liver is unremarkable. Cholecystectomy. Low-density 1.8 cm nodule in the body of the right adrenal gland. Thickening of the lateral limb right adrenal gland. Visualized portions of the left adrenal gland, right kidney, spleen, pancreas, stomach and bowel are otherwise grossly unremarkable. Musculoskeletal: Degenerative changes in the spine. Sclerosis in the lower sternum is unchanged. Flowing anterior osteophytosis in the thoracic spine. IMPRESSION: 1. Postsurgical and post treatment changes in  the right hemithorax, stable. 2. Stable sclerotic lesion in the inferior sternum. Difficult to exclude metastatic disease. 3. 7 mm subpleural left lower lobe nodule, similar. Continued attention on follow-up is warranted. 4. Small right pleural effusion. 5. Right adrenal adenoma. 6. Aortic atherosclerosis (ICD10-I70.0). Coronary artery calcification. 7. Ascending aortic aneurysm, stable. Recommend annual imaging followup by CTA or MRA. This recommendation follows 2010 ACCF/AHA/AATS/ACR/ASA/SCA/SCAI/SIR/STS/SVM Guidelines for the Diagnosis and Management of Patients with Thoracic Aortic Disease. Circulation. 2010; 121: U272-Z366. Aortic aneurysm NOS (ICD10-I71.9). 8.  Emphysema (ICD10-J43.9). Electronically Signed   By: Lorin Picket M.D.   On: 07/13/2020 13:00     ASSESSMENT:  1. Stage III right lung adenocarcinoma: -Diagnosed in June 2017, status post right lower lobectomy, VATS with mediastinal lymph node sampling, PD-L1 negative. -6 cycles of carboplatin and pemetrexed completed on 05/31/2016, XRT completed on 08/01/2016. -Consolidation durvalumab from 09/04/2016 through 08/28/2017. -CT chest on 12/31/2019 showed stable radiation fibrosis in the  right perihilar lung. No evidence of local tumor recurrence. Enlarging sclerotic lesion in the lower sternum, cannot exclude sclerotic bone metastasis. No evidence of metastatic disease in the chest. Trace dependent right pleural effusion has decreased. Stable at static 4.1 cm ascending thoracic aorta. Stable right adrenal adenoma.   PLAN:  1. Stage III right lung adenocarcinoma: -He reportedly had severe headache and confusion, evaluated at [facility] health ER. -I have reviewed his CT and MRI of the brain which were negative.  It was thought to be hypertensive urgency causing headache. -He reports some pain on and off in the right posterior neck region.  Likely musculoskeletal. -Reviewed CT scan of the chest without contrast from 07/13/2020 which did not show any evidence of recurrence. -Recommend follow-up in 6 months with CT scan and labs.  2. Iron deficiency anemia: -His ferritin is 96 and hemoglobin is 12.6.  No Feraheme needed.  3. Vitamin B12 deficiency: -He is continuing B12 shots at home.   Orders placed this encounter:  No orders of the defined types were placed in this encounter.    Derek Jack, MD [place] [phone]   I, Milinda Antis, am acting as a scribe for Dr. Sanda Linger.  I, Derek Jack MD, have reviewed the above documentation for accuracy and completeness, and I agree with the above.

## 2020-08-24 ENCOUNTER — Emergency Department (HOSPITAL_COMMUNITY)
Admission: EM | Admit: 2020-08-24 | Discharge: 2020-08-24 | Disposition: A | Discharge status: Home / Self Care | Payer: Medicare Other | Attending: Emergency Medicine | Admitting: Emergency Medicine

## 2020-08-24 ENCOUNTER — Other Ambulatory Visit: Payer: Self-pay

## 2020-08-24 ENCOUNTER — Encounter (HOSPITAL_COMMUNITY): Payer: Self-pay

## 2020-08-24 DIAGNOSIS — Z5321 Procedure and treatment not carried out due to patient leaving prior to being seen by health care provider: Secondary | ICD-10-CM | POA: Diagnosis not present

## 2020-08-24 DIAGNOSIS — X58XXXA Exposure to other specified factors, initial encounter: Secondary | ICD-10-CM | POA: Insufficient documentation

## 2020-08-24 DIAGNOSIS — T17208A Unspecified foreign body in pharynx causing other injury, initial encounter: Secondary | ICD-10-CM | POA: Insufficient documentation

## 2020-08-24 NOTE — ED Triage Notes (Signed)
Pt to er, pt states that he thinks that he has a milkdud stuck in his throat, states that about two hours ago it happened and he doesn't feel like he can get it down any further, states that he ate a banana but that didn't help, pt is managing his secretions.  resps even and unlabored, talking in full sentences.

## 2020-09-15 ENCOUNTER — Inpatient Hospital Stay (HOSPITAL_COMMUNITY): Payer: Medicare Other | Attending: Hematology

## 2020-09-15 ENCOUNTER — Encounter (HOSPITAL_COMMUNITY): Payer: Self-pay

## 2020-09-15 ENCOUNTER — Other Ambulatory Visit: Payer: Self-pay

## 2020-09-15 VITALS — BP 158/39 | HR 96 | Temp 97.1°F | Resp 16

## 2020-09-15 DIAGNOSIS — E538 Deficiency of other specified B group vitamins: Secondary | ICD-10-CM

## 2020-09-15 DIAGNOSIS — C3431 Malignant neoplasm of lower lobe, right bronchus or lung: Secondary | ICD-10-CM | POA: Diagnosis present

## 2020-09-15 MED ORDER — CYANOCOBALAMIN 1000 MCG/ML IJ SOLN
1000.0000 ug | Freq: Once | INTRAMUSCULAR | Status: AC
  Administered 2020-09-15: 1000 ug via INTRAMUSCULAR
  Filled 2020-09-15: qty 1

## 2020-09-15 NOTE — Progress Notes (Signed)
Patient's friend reports Patient's son was not giving the patient his B12 injections at home, and had them rescheduled to get them at the [facility]. Dr. Delton Coombes made aware with no orders received.  Patient tolerated B12 injection with no complaints voiced. Site clean and dry with no bruising or swelling noted at site. See MAR for details. Band aid applied.  Patient stable during and after injection. VSS with discharge and left in satisfactory condition with no s/s of distress noted.

## 2020-09-15 NOTE — Patient Instructions (Signed)
[place] at [facility]  Discharge Instructions:  You received your B12 injection today. Return as scheduled. _______________________________________________________________  Thank you for choosing [place] at [facility] to provide your oncology and hematology care.  To afford each patient quality time with our providers, please arrive at least 15 minutes before your scheduled appointment.  You need to re-schedule your appointment if you arrive 10 or more minutes late.  We strive to give you quality time with our providers, and arriving late affects you and other patients whose appointments are after yours.  Also, if you no show three or more times for appointments you may be dismissed from the clinic.  Again, thank you for choosing [place] at [place] hope is that these requests will allow you access to exceptional care and in a timely manner. _______________________________________________________________  If you have questions after your visit, please contact our office at [phone] between the hours of 8:30 a.m. and 5:00 p.m. Voicemails left after 4:30 p.m. will not be returned until the following business day. _______________________________________________________________  For prescription refill requests, have your pharmacy contact our office. _______________________________________________________________  Recommendations made by the consultant and any test results will be sent to your referring physician. _______________________________________________________________

## 2020-10-17 ENCOUNTER — Other Ambulatory Visit: Payer: Self-pay

## 2020-10-17 ENCOUNTER — Inpatient Hospital Stay (HOSPITAL_COMMUNITY): Payer: Medicare Other | Attending: Hematology

## 2020-10-17 VITALS — BP 121/38 | HR 88 | Resp 19

## 2020-10-17 DIAGNOSIS — E538 Deficiency of other specified B group vitamins: Secondary | ICD-10-CM | POA: Diagnosis not present

## 2020-10-17 DIAGNOSIS — Z95828 Presence of other vascular implants and grafts: Secondary | ICD-10-CM

## 2020-10-17 MED ORDER — HEPARIN SOD (PORK) LOCK FLUSH 100 UNIT/ML IV SOLN
500.0000 [IU] | Freq: Once | INTRAVENOUS | Status: AC
Start: 2020-10-17 — End: 2020-10-17
  Administered 2020-10-17: 500 [IU] via INTRAVENOUS

## 2020-10-17 MED ORDER — CYANOCOBALAMIN 1000 MCG/ML IJ SOLN
1000.0000 ug | Freq: Once | INTRAMUSCULAR | Status: AC
  Administered 2020-10-17: 1000 ug via INTRAMUSCULAR
  Filled 2020-10-17: qty 1

## 2020-10-17 MED ORDER — SODIUM CHLORIDE 0.9% FLUSH
10.0000 mL | INTRAVENOUS | Status: DC | PRN
  Administered 2020-10-17: 10 mL via INTRAVENOUS

## 2020-10-17 NOTE — Progress Notes (Signed)
Patients port flushed without difficulty.  Good blood return noted with no bruising or swelling noted at site.  Band aid applied.  VSS with discharge and left in satisfactory condition with no s/s of distress noted.   Patient tolerated Vitamin B12 injection with no complaints voiced.  Site clean and dry with no bruising or swelling noted.  No complaints of pain.  Discharged with vital signs stable and no signs or symptoms of distress noted.

## 2020-11-14 ENCOUNTER — Inpatient Hospital Stay (HOSPITAL_COMMUNITY): Payer: Medicare Other | Attending: Hematology

## 2020-11-14 ENCOUNTER — Other Ambulatory Visit: Payer: Self-pay

## 2020-11-14 VITALS — BP 137/50 | HR 88 | Temp 97.2°F | Resp 18 | Wt 205.4 lb

## 2020-11-14 DIAGNOSIS — E538 Deficiency of other specified B group vitamins: Secondary | ICD-10-CM | POA: Diagnosis not present

## 2020-11-14 MED ORDER — CYANOCOBALAMIN 1000 MCG/ML IJ SOLN
1000.0000 ug | Freq: Once | INTRAMUSCULAR | Status: AC
  Administered 2020-11-14: 1000 ug via INTRAMUSCULAR

## 2020-11-14 NOTE — Patient Instructions (Signed)
[facility]  Discharge Instructions: Thank you for choosing [facility] to provide your oncology and hematology care.  If you have a lab appointment with the Cancer Center, please come in thru the Main Entrance and check in at the main information desk.  Wear comfortable clothing and clothing appropriate for easy access to any Portacath or PICC line.   We strive to give you quality time with your provider. You may need to reschedule your appointment if you arrive late (15 or more minutes).  Arriving late affects you and other patients whose appointments are after yours.  Also, if you miss three or more appointments without notifying the office, you may be dismissed from the clinic at the provider's discretion.      For prescription refill requests, have your pharmacy contact our office and allow 72 hours for refills to be completed.        To help prevent nausea and vomiting after your treatment, we encourage you to take your nausea medication as directed.  BELOW ARE SYMPTOMS THAT SHOULD BE REPORTED IMMEDIATELY: *FEVER GREATER THAN 100.4 F (38 C) OR HIGHER *CHILLS OR SWEATING *NAUSEA AND VOMITING THAT IS NOT CONTROLLED WITH YOUR NAUSEA MEDICATION *UNUSUAL SHORTNESS OF BREATH *UNUSUAL BRUISING OR BLEEDING *URINARY PROBLEMS (pain or burning when urinating, or frequent urination) *BOWEL PROBLEMS (unusual diarrhea, constipation, pain near the anus) TENDERNESS IN MOUTH AND THROAT WITH OR WITHOUT PRESENCE OF ULCERS (sore throat, sores in mouth, or a toothache) UNUSUAL RASH, SWELLING OR PAIN  UNUSUAL VAGINAL DISCHARGE OR ITCHING   Items with * indicate a potential emergency and should be followed up as soon as possible or go to the Emergency Department if any problems should occur.  Please show the CHEMOTHERAPY ALERT CARD or IMMUNOTHERAPY ALERT CARD at check-in to the Emergency Department and triage nurse.  Should you have questions after your visit or need to cancel  or reschedule your appointment, please contact [facility] [phone]  and follow the prompts.  Office hours are 8:00 a.m. to 4:30 p.m. Monday - Friday. Please note that voicemails left after 4:00 p.m. may not be returned until the following business day.  We are closed weekends and major holidays. You have access to a nurse at all times for urgent questions. Please call the main number to the clinic [phone] and follow the prompts.  For any non-urgent questions, you may also contact your provider using MyChart. We now offer e-Visits for anyone 18 and older to request care online for non-urgent symptoms. For details visit [email or web address].   Also download the MyChart app! Go to the app store, search "MyChart", open the app, select [facility], and log in with your MyChart username and password.  Due to Covid, a mask is required upon entering the hospital/clinic. If you do not have a mask, one will be given to you upon arrival. For doctor visits, patients may have 1 support person aged 18 or older with them. For treatment visits, patients cannot have anyone with them due to current Covid guidelines and our immunocompromised population.  

## 2020-11-14 NOTE — Progress Notes (Signed)
[name] presents today for B12 injection per the provider's orders.  Stable during administration without incident; injection site WNL; see MAR for injection details.  Patient tolerated procedure well and without incident.  No questions or complaints noted at this time.  Vital signs stable.  No complaints at this time.  Discharge from clinic ambulatory in stable condition.  Alert and oriented X 3.  Follow up with [facility] as scheduled.

## 2020-12-13 ENCOUNTER — Other Ambulatory Visit: Payer: Self-pay

## 2020-12-13 ENCOUNTER — Inpatient Hospital Stay (HOSPITAL_COMMUNITY): Payer: Medicare Other | Attending: Hematology

## 2020-12-13 VITALS — BP 122/50 | HR 87 | Temp 96.8°F | Resp 18

## 2020-12-13 DIAGNOSIS — E538 Deficiency of other specified B group vitamins: Secondary | ICD-10-CM | POA: Diagnosis present

## 2020-12-13 DIAGNOSIS — C3431 Malignant neoplasm of lower lobe, right bronchus or lung: Secondary | ICD-10-CM | POA: Diagnosis not present

## 2020-12-13 MED ORDER — CYANOCOBALAMIN 1000 MCG/ML IJ SOLN
INTRAMUSCULAR | Status: AC
  Filled 2020-12-13: qty 1

## 2020-12-13 MED ORDER — CYANOCOBALAMIN 1000 MCG/ML IJ SOLN
1000.0000 ug | Freq: Once | INTRAMUSCULAR | Status: AC
  Administered 2020-12-13: 1000 ug via INTRAMUSCULAR

## 2020-12-13 NOTE — Progress Notes (Signed)
[name] presents today for B12 injection per the provider's orders.  Stable during administration without incident; injection site WNL; see MAR for injection details.  Patient tolerated procedure well and without incident.  No questions or complaints noted at this time. Discharge from clinic ambulatory in stable condition.  Alert and oriented X 3.  Follow up with [facility] as scheduled.

## 2020-12-13 NOTE — Patient Instructions (Signed)
[facility]  Discharge Instructions: Thank you for choosing [facility] to provide your oncology and hematology care.  If you have a lab appointment with the [facility], please come in thru the Main Entrance and check in at the main information desk.  Wear comfortable clothing and clothing appropriate for easy access to any Portacath or PICC line.   We strive to give you quality time with your provider. You may need to reschedule your appointment if you arrive late (15 or more minutes).  Arriving late affects you and other patients whose appointments are after yours.  Also, if you miss three or more appointments without notifying the office, you may be dismissed from the clinic at the provider's discretion.      For prescription refill requests, have your pharmacy contact our office and allow 72 hours for refills to be completed.    Today you received the following chemotherapy and/or immunotherapy agents B12 injection      To help prevent nausea and vomiting after your treatment, we encourage you to take your nausea medication as directed.  BELOW ARE SYMPTOMS THAT SHOULD BE REPORTED IMMEDIATELY: *FEVER GREATER THAN 100.4 F (38 C) OR HIGHER *CHILLS OR SWEATING *NAUSEA AND VOMITING THAT IS NOT CONTROLLED WITH YOUR NAUSEA MEDICATION *UNUSUAL SHORTNESS OF BREATH *UNUSUAL BRUISING OR BLEEDING *URINARY PROBLEMS (pain or burning when urinating, or frequent urination) *BOWEL PROBLEMS (unusual diarrhea, constipation, pain near the anus) TENDERNESS IN MOUTH AND THROAT WITH OR WITHOUT PRESENCE OF ULCERS (sore throat, sores in mouth, or a toothache) UNUSUAL RASH, SWELLING OR PAIN  UNUSUAL VAGINAL DISCHARGE OR ITCHING   Items with * indicate a potential emergency and should be followed up as soon as possible or go to the Emergency Department if any problems should occur.  Please show the CHEMOTHERAPY ALERT CARD or IMMUNOTHERAPY ALERT CARD at check-in to the Emergency  Department and triage nurse.  Should you have questions after your visit or need to cancel or reschedule your appointment, please contact [facility] [phone]  and follow the prompts.  Office hours are 8:00 a.m. to 4:30 p.m. Monday - Friday. Please note that voicemails left after 4:00 p.m. may not be returned until the following business day.  We are closed weekends and major holidays. You have access to a nurse at all times for urgent questions. Please call the main number to the clinic [phone] and follow the prompts.  For any non-urgent questions, you may also contact your provider using MyChart. We now offer e-Visits for anyone 37 and older to request care online for non-urgent symptoms. For details visit [email or web address].   Also download the MyChart app! Go to the app store, search "MyChart", open the app, select [facility], and log in with your MyChart username and password.  Due to Covid, a mask is required upon entering the hospital/clinic. If you do not have a mask, one will be given to you upon arrival. For doctor visits, patients may have 1 support person aged 22 or older with them. For treatment visits, patients cannot have anyone with them due to current Covid guidelines and our immunocompromised population.

## 2020-12-22 ENCOUNTER — Ambulatory Visit (INDEPENDENT_AMBULATORY_CARE_PROVIDER_SITE_OTHER): Payer: Medicare Other | Admitting: Gastroenterology

## 2020-12-22 ENCOUNTER — Encounter: Payer: Medicare Other | Admitting: Dermatology

## 2020-12-22 ENCOUNTER — Telehealth (INDEPENDENT_AMBULATORY_CARE_PROVIDER_SITE_OTHER): Payer: Self-pay

## 2020-12-22 ENCOUNTER — Encounter (INDEPENDENT_AMBULATORY_CARE_PROVIDER_SITE_OTHER): Payer: Self-pay

## 2020-12-22 ENCOUNTER — Encounter (INDEPENDENT_AMBULATORY_CARE_PROVIDER_SITE_OTHER): Payer: Self-pay | Admitting: Gastroenterology

## 2020-12-22 ENCOUNTER — Other Ambulatory Visit: Payer: Self-pay

## 2020-12-22 ENCOUNTER — Other Ambulatory Visit (INDEPENDENT_AMBULATORY_CARE_PROVIDER_SITE_OTHER): Payer: Self-pay

## 2020-12-22 VITALS — BP 136/74 | HR 82 | Temp 98.6°F | Ht 70.0 in | Wt 204.0 lb

## 2020-12-22 DIAGNOSIS — R195 Other fecal abnormalities: Secondary | ICD-10-CM

## 2020-12-22 DIAGNOSIS — R1314 Dysphagia, pharyngoesophageal phase: Secondary | ICD-10-CM | POA: Diagnosis not present

## 2020-12-22 MED ORDER — PEG 3350-KCL-NA BICARB-NACL 420 G PO SOLR: 4000.0000 mL | ORAL | 0 refills | Status: DC

## 2020-12-22 NOTE — H&P (View-Only) (Signed)
Maylon Peppers, M.D. Gastroenterology & Hepatology [facility] [address] [place], [place] [address]  Primary Care Physician: Monico Blitz, [place] [address]  I will communicate my assessment and recommendations to the referring MD via EMR.  Problems: Positive fecal occult blood test Intermittent dysphagia  History of Present Illness: Gary OSHITA is a 87 y.o. male with PMH stage 3 lung adenocarcinoma s/p lung resection and CTX, afib, DM, stroke, HLD, GERD, diverticulitis, who presents for follow up of positive fecal occult blood test and intermittent dysphagia.  The patient was last seen on 02/29/2020. At that time, the patient was scheduled to undergo an EGD and colonoscopy.  He was also advised to start taking MiraLAX for constipation..  The patient could not proceed with his procedures as he developed some pain in his toe which was considered to be secondary to gout and he did not not reschedule these procedures afterwards.  The patient was sent to our office as he had a repeat fecal occult blood test check at his PCPs office and it was positive again. Has noticed some dark stools possibly, states possibly after starting some diabetes medications.  However, he does not think the stools are black.  Patient is currently taking Miralax and Metamucil every day and has a BM every day. Occasionally has some bloating in his mid abdomen but this is infrequent.  He is very concerned about the reason for his positive fecal occult blood test and would like to proceed with any testing that is needed to rule out any major alteration in his gastrointestinal tract.  The patient also reports having some episodes of choking with any kind of solid food, but is not every time he eats.  The patient denies having any nausea, vomiting, fever, chills, hematochezia, melena, hematemesis, , abdominal pain, diarrhea, jaundice, pruritus or  weight loss.  Last EGD: Never Last Colonoscopy: 2017 -presence of melanosis coli, 4 polyps were found at the explain flexure and hepatic flexure, diverticulosis.  Normal terminal ileum.  Pathology was positive for tubular adenomas x4.  Past Medical History: Past Medical History:  Diagnosis Date   Adenocarcinoma of lung, stage 3 (HCC)    Stage IIIA   Adenocarcinoma of right lung, stage 3 ([place]) 12/02/2015   Arthritis    Atrial fibrillation, transient (HCC)    transient postop, < 24 hours   B12 deficiency    Cyst of scrotum    Diverticulitis    GERD (gastroesophageal reflux disease)    Hernia    History of pneumonia    Hyperlipidemia    Lung nodule    Spiculated right middle lobe nodule, hypermetabolic   Stroke ([place])    Type 2 diabetes mellitus ([place])     Past Surgical History: Past Surgical History:  Procedure Laterality Date   BACK SURGERY     CHOLECYSTECTOMY     COLONOSCOPY N/A 11/24/2015   Procedure: COLONOSCOPY;  Surgeon: Rogene Houston, MD;  [facility];  Service: Endoscopy;  Laterality: N/A;  730   COLONOSCOPY W/ POLYPECTOMY     x 5   CYST EXCISION     Scrotum   EYE SURGERY Bilateral    Cataract with Lens   HERNIA REPAIR Right    Inguinal   IR GENERIC HISTORICAL  02/09/2016   IR US GUIDE VASC ACCESS RIGHT 02/09/2016 Arne Cleveland, MD [facility]   IR GENERIC HISTORICAL  02/09/2016   IR FLUORO GUIDE CV  LINE RIGHT 02/09/2016 Arne Cleveland, MD [facility]   LUMBAR DISC SURGERY     per patient report   [place] (VATS)/ LOBECTOMY Right 12/30/2015   Procedure: VIDEO ASSISTED THORACOSCOPY (VATS)/RIGHT MIDDLE LOBECTOMY;  Surgeon: Melrose Nakayama, MD;  Location: [facility];  Service: Thoracic;  Laterality: Right;    Family History: Family History  Problem Relation Age of Onset   Colon cancer Brother     Social History: Social History   Tobacco Use  Smoking Status Former   Types: Cigarettes   Quit date:  05/27/2004   Years since quitting: 16.5  Smokeless Tobacco Never   Social History   Substance and Sexual Activity  Alcohol Use No   Alcohol/week: 0.0 standard drinks   Social History   Substance and Sexual Activity  Drug Use No    Allergies: Allergies  Allergen Reactions   Morphine And Related Other (See Comments)    confusion   Amitriptyline    Omeprazole     Other reaction(s): Delirium   Temazepam     Other reaction(s): Nightmares   Zantac [Ranitidine]     Other reaction(s): Delirium    Medications: Current Outpatient Medications  Medication Sig Dispense Refill   ACCU-CHEK AVIVA PLUS test strip USE 5 STRIPS DAILY TO MONITOR FLUCTUATING BLOOD SUGAR  4   ACCU-CHEK FASTCLIX LANCETS MISC USE 6 LANCETS DAILY  5   acetaminophen (TYLENOL) 500 MG tablet Take by mouth.     ALPRAZolam (XANAX) 0.5 MG tablet TAKE ONE TABLET BY MOUTH AT BEDTIME AS NEEDED - DO NOT TAKE WITH AMBIEN (ZOLPIDEM)     aspirin EC 81 MG tablet Take 81 mg by mouth daily.     B-D 3CC LUER-LOK SYR 25GX1" 25G X 1" 3 ML MISC Inject into the muscle.     benzonatate (TESSALON) 100 MG capsule Take by mouth 3 (three) times daily as needed for cough.     Bromfenac Sodium 0.07 % SOLN Place 1 drop into both eyes daily as needed (eye pain).     calcium acetate, Phos Binder, (PHOSLYRA) 667 MG/5ML SOLN Take 1,334 mg by mouth 3 (three) times daily with meals.     calcium carbonate (OS-CAL - DOSED IN MG OF ELEMENTAL CALCIUM) 1250 (500 Ca) MG tablet Take 1 tablet (500 mg of elemental calcium total) by mouth daily with breakfast. 30 tablet 2   Carboxymethylcellulose Sodium 0.25 % SOLN Place 1 drop into both eyes daily as needed (dry eyes).     Cholecalciferol (VITAMIN D) 2000 UNITS CAPS Take 1 capsule by mouth daily.      cyanocobalamin (,VITAMIN B-12,) 1000 MCG/ML injection Inject 1 mL (1,000 mcg total) into the muscle every 30 (thirty) days. 1 mL 12   gabapentin (NEURONTIN) 100 MG capsule Take 100 mg by mouth at bedtime.      glipiZIDE (GLUCOTROL) 5 MG tablet Take 1 tablet (5 mg total) by mouth 2 (two) times daily before a meal. (Patient taking differently: Take 2.5 mg by mouth 2 (two) times daily before a meal.) 60 tablet 1   lidocaine-prilocaine (EMLA) cream Apply a quarter size amount to port site 1 hour prior to chemo. Do not rub in. Cover with plastic wrap. 30 g 3   lisinopril (ZESTRIL) 5 MG tablet Take 5 mg by mouth daily.     loratadine (CLARITIN) 10 MG tablet Take 10 mg by mouth daily.     Meclizine HCl 25 MG CHEW CHEW ONE TABLET  BY MOUTH THREE TIMES A DAY AS NEEDED FOR VERTIGO     Misc. Devices MISC Please provide syringe and IM needle to draw up and inject his vitamin B-12 monthly. 1 each 99   pantoprazole (PROTONIX) 40 MG tablet Take 40 mg by mouth 2 (two) times daily.      potassium chloride SA (K-DUR,KLOR-CON) 20 MEQ tablet Take 1 tablet (20 mEq total) by mouth 2 (two) times daily. 6 tablet 0   simvastatin (ZOCOR) 40 MG tablet Take 40 mg by mouth at bedtime.      terazosin (HYTRIN) 5 MG capsule Take 5 mg by mouth at bedtime.     zolpidem (AMBIEN) 10 MG tablet 1/2 prn     ondansetron (ZOFRAN) 8 MG tablet Take 1 tablet by mouth 3 (three) times daily. (Patient not taking: Reported on 12/22/2020)     prochlorperazine (COMPAZINE) 10 MG tablet Take 1 tablet by mouth every 6 (six) hours as needed.     No current facility-administered medications for this visit.   Facility-Administered Medications Ordered in Other Visits  Medication Dose Route Frequency Provider Last Rate Last Admin   heparin lock flush 100 unit/mL  500 Units Intravenous Once Derek Jack, MD       sodium chloride flush (NS) 0.9 % injection 10 mL  10 mL Intravenous PRN Derek Jack, MD        Review of Systems: GENERAL: negative for malaise, night sweats HEENT: No changes in hearing or vision, no nose bleeds or other nasal problems. NECK: Negative for lumps, goiter, pain and significant neck swelling RESPIRATORY: Negative  for cough, wheezing CARDIOVASCULAR: Negative for chest pain, leg swelling, palpitations, orthopnea GI: SEE HPI MUSCULOSKELETAL: Negative for joint pain or swelling, back pain, and muscle pain. SKIN: Negative for lesions, rash PSYCH: Negative for sleep disturbance, mood disorder and recent psychosocial stressors. HEMATOLOGY Negative for prolonged bleeding, bruising easily, and swollen nodes. ENDOCRINE: Negative for cold or heat intolerance, polyuria, polydipsia and goiter. NEURO: negative for tremor, gait imbalance, syncope and seizures. The remainder of the review of systems is noncontributory.   Physical Exam: BP 136/74 (BP Location: Right Arm, Patient Position: Sitting, Cuff Size: Large)   Pulse 82   Temp 98.6 F (37 C) (Oral)   Ht 5\' 10"  (1.778 m)   Wt 204 lb (92.5 kg)   BMI 29.27 kg/m  GENERAL: The patient is AO x3, in no acute distress. HEENT: Head is normocephalic and atraumatic. EOMI are intact. Mouth is well hydrated and without lesions. NECK: Supple. No masses LUNGS: Clear to auscultation. No presence of rhonchi/wheezing/rales. Adequate chest expansion HEART: RRR, normal s1 and s2. ABDOMEN: Soft, nontender, no guarding, no peritoneal signs, and nondistended. BS +. No masses. EXTREMITIES: Without any cyanosis, clubbing, rash, lesions or edema. NEUROLOGIC: AOx3, no focal motor deficit. SKIN: no jaundice, no rashes  Imaging/Labs: as above  I personally reviewed and interpreted the available labs, imaging and endoscopic files.  Impression and Plan: SAYGE SALVATO is a 87 y.o. male with PMH stage 3 lung adenocarcinoma s/p lung resection and CTX, afib, DM, stroke, HLD, GERD, diverticulitis, who presents for follow up of positive fecal occult blood test and intermittent dysphagia.  The patient.  He came to our clinic for evaluation of possible fecal occult blood test but due to other ongoing medical issues he could not proceed with his previous EGD and colonoscopy.  However, he  recently tested positive again for fecal occult blood for which he will like to proceed with his  previous testing.  He has not presented any major gastrointestinal bleeding or other complaints.  I consider undergoing an EGD and colonoscopy is adequate but I advised the patient that if this testing is unremarkable I would not recommend having more stool testing for occult blood as this is a test that is performed for screening purposes.  The esophagogastroduodenospy will also help [place] evaluate his dysphagia/choking episodes.  We will proceed with these investigations.  The patient understood and agreed.  - Schedule EGD and colonoscopy  All questions were answered.      Harvel Quale, MD Gastroenterology and Hepatology [facility]

## 2020-12-22 NOTE — Patient Instructions (Signed)
Schedule EGD and colonoscopy

## 2020-12-22 NOTE — Telephone Encounter (Signed)
LeighAnn Dejha King, CMA  

## 2020-12-22 NOTE — Progress Notes (Signed)
Maylon Peppers, M.D. Gastroenterology & Hepatology [facility] [address] [place], [place] [address]  Primary Care Physician: Monico Blitz, [place] [address]  I will communicate my assessment and recommendations to the referring MD via EMR.  Problems: Positive fecal occult blood test Intermittent dysphagia  History of Present Illness: KELLYN MANSFIELD is a 87 y.o. male with PMH stage 3 lung adenocarcinoma s/p lung resection and CTX, afib, DM, stroke, HLD, GERD, diverticulitis, who presents for follow up of positive fecal occult blood test and intermittent dysphagia.  The patient was last seen on 02/29/2020. At that time, the patient was scheduled to undergo an EGD and colonoscopy.  He was also advised to start taking MiraLAX for constipation..  The patient could not proceed with his procedures as he developed some pain in his toe which was considered to be secondary to gout and he did not not reschedule these procedures afterwards.  The patient was sent to our office as he had a repeat fecal occult blood test check at his PCPs office and it was positive again. Has noticed some dark stools possibly, states possibly after starting some diabetes medications.  However, he does not think the stools are black.  Patient is currently taking Miralax and Metamucil every day and has a BM every day. Occasionally has some bloating in his mid abdomen but this is infrequent.  He is very concerned about the reason for his positive fecal occult blood test and would like to proceed with any testing that is needed to rule out any major alteration in his gastrointestinal tract.  The patient also reports having some episodes of choking with any kind of solid food, but is not every time he eats.  The patient denies having any nausea, vomiting, fever, chills, hematochezia, melena, hematemesis, , abdominal pain, diarrhea, jaundice, pruritus or  weight loss.  Last EGD: Never Last Colonoscopy: 2017 -presence of melanosis coli, 4 polyps were found at the explain flexure and hepatic flexure, diverticulosis.  Normal terminal ileum.  Pathology was positive for tubular adenomas x4.  Past Medical History: Past Medical History:  Diagnosis Date   Adenocarcinoma of lung, stage 3 (HCC)    Stage IIIA   Adenocarcinoma of right lung, stage 3 ([place]) 12/02/2015   Arthritis    Atrial fibrillation, transient (HCC)    transient postop, < 24 hours   B12 deficiency    Cyst of scrotum    Diverticulitis    GERD (gastroesophageal reflux disease)    Hernia    History of pneumonia    Hyperlipidemia    Lung nodule    Spiculated right middle lobe nodule, hypermetabolic   Stroke ([place])    Type 2 diabetes mellitus ([place])     Past Surgical History: Past Surgical History:  Procedure Laterality Date   BACK SURGERY     CHOLECYSTECTOMY     COLONOSCOPY N/A 11/24/2015   Procedure: COLONOSCOPY;  Surgeon: Rogene Houston, MD;  Location: [facility];  Service: Endoscopy;  Laterality: N/A;  730   COLONOSCOPY W/ POLYPECTOMY     x 5   CYST EXCISION     Scrotum   EYE SURGERY Bilateral    Cataract with Lens   HERNIA REPAIR Right    Inguinal   IR GENERIC HISTORICAL  02/09/2016   IR US GUIDE VASC ACCESS RIGHT 02/09/2016 Arne Cleveland, MD [facility]   IR GENERIC HISTORICAL  02/09/2016   IR FLUORO GUIDE CV  LINE RIGHT 02/09/2016 Arne Cleveland, MD [facility]   LUMBAR DISC SURGERY     per patient report   [place] (VATS)/ LOBECTOMY Right 12/30/2015   Procedure: VIDEO ASSISTED THORACOSCOPY (VATS)/RIGHT MIDDLE LOBECTOMY;  Surgeon: Melrose Nakayama, MD;  Location: [facility];  Service: Thoracic;  Laterality: Right;    Family History: Family History  Problem Relation Age of Onset   Colon cancer Brother     Social History: Social History   Tobacco Use  Smoking Status Former   Types: Cigarettes   Quit date:  05/27/2004   Years since quitting: 16.5  Smokeless Tobacco Never   Social History   Substance and Sexual Activity  Alcohol Use No   Alcohol/week: 0.0 standard drinks   Social History   Substance and Sexual Activity  Drug Use No    Allergies: Allergies  Allergen Reactions   Morphine And Related Other (See Comments)    confusion   Amitriptyline    Omeprazole     Other reaction(s): Delirium   Temazepam     Other reaction(s): Nightmares   Zantac [Ranitidine]     Other reaction(s): Delirium    Medications: Current Outpatient Medications  Medication Sig Dispense Refill   ACCU-CHEK AVIVA PLUS test strip USE 5 STRIPS DAILY TO MONITOR FLUCTUATING BLOOD SUGAR  4   ACCU-CHEK FASTCLIX LANCETS MISC USE 6 LANCETS DAILY  5   acetaminophen (TYLENOL) 500 MG tablet Take by mouth.     ALPRAZolam (XANAX) 0.5 MG tablet TAKE ONE TABLET BY MOUTH AT BEDTIME AS NEEDED - DO NOT TAKE WITH AMBIEN (ZOLPIDEM)     aspirin EC 81 MG tablet Take 81 mg by mouth daily.     B-D 3CC LUER-LOK SYR 25GX1" 25G X 1" 3 ML MISC Inject into the muscle.     benzonatate (TESSALON) 100 MG capsule Take by mouth 3 (three) times daily as needed for cough.     Bromfenac Sodium 0.07 % SOLN Place 1 drop into both eyes daily as needed (eye pain).     calcium acetate, Phos Binder, (PHOSLYRA) 667 MG/5ML SOLN Take 1,334 mg by mouth 3 (three) times daily with meals.     calcium carbonate (OS-CAL - DOSED IN MG OF ELEMENTAL CALCIUM) 1250 (500 Ca) MG tablet Take 1 tablet (500 mg of elemental calcium total) by mouth daily with breakfast. 30 tablet 2   Carboxymethylcellulose Sodium 0.25 % SOLN Place 1 drop into both eyes daily as needed (dry eyes).     Cholecalciferol (VITAMIN D) 2000 UNITS CAPS Take 1 capsule by mouth daily.      cyanocobalamin (,VITAMIN B-12,) 1000 MCG/ML injection Inject 1 mL (1,000 mcg total) into the muscle every 30 (thirty) days. 1 mL 12   gabapentin (NEURONTIN) 100 MG capsule Take 100 mg by mouth at bedtime.      glipiZIDE (GLUCOTROL) 5 MG tablet Take 1 tablet (5 mg total) by mouth 2 (two) times daily before a meal. (Patient taking differently: Take 2.5 mg by mouth 2 (two) times daily before a meal.) 60 tablet 1   lidocaine-prilocaine (EMLA) cream Apply a quarter size amount to port site 1 hour prior to chemo. Do not rub in. Cover with plastic wrap. 30 g 3   lisinopril (ZESTRIL) 5 MG tablet Take 5 mg by mouth daily.     loratadine (CLARITIN) 10 MG tablet Take 10 mg by mouth daily.     Meclizine HCl 25 MG CHEW CHEW ONE TABLET  BY MOUTH THREE TIMES A DAY AS NEEDED FOR VERTIGO     Misc. Devices MISC Please provide syringe and IM needle to draw up and inject his vitamin B-12 monthly. 1 each 99   pantoprazole (PROTONIX) 40 MG tablet Take 40 mg by mouth 2 (two) times daily.      potassium chloride SA (K-DUR,KLOR-CON) 20 MEQ tablet Take 1 tablet (20 mEq total) by mouth 2 (two) times daily. 6 tablet 0   simvastatin (ZOCOR) 40 MG tablet Take 40 mg by mouth at bedtime.      terazosin (HYTRIN) 5 MG capsule Take 5 mg by mouth at bedtime.     zolpidem (AMBIEN) 10 MG tablet 1/2 prn     ondansetron (ZOFRAN) 8 MG tablet Take 1 tablet by mouth 3 (three) times daily. (Patient not taking: Reported on 12/22/2020)     prochlorperazine (COMPAZINE) 10 MG tablet Take 1 tablet by mouth every 6 (six) hours as needed.     No current facility-administered medications for this visit.   Facility-Administered Medications Ordered in Other Visits  Medication Dose Route Frequency Provider Last Rate Last Admin   heparin lock flush 100 unit/mL  500 Units Intravenous Once Derek Jack, MD       sodium chloride flush (NS) 0.9 % injection 10 mL  10 mL Intravenous PRN Derek Jack, MD        Review of Systems: GENERAL: negative for malaise, night sweats HEENT: No changes in hearing or vision, no nose bleeds or other nasal problems. NECK: Negative for lumps, goiter, pain and significant neck swelling RESPIRATORY: Negative  for cough, wheezing CARDIOVASCULAR: Negative for chest pain, leg swelling, palpitations, orthopnea GI: SEE HPI MUSCULOSKELETAL: Negative for joint pain or swelling, back pain, and muscle pain. SKIN: Negative for lesions, rash PSYCH: Negative for sleep disturbance, mood disorder and recent psychosocial stressors. HEMATOLOGY Negative for prolonged bleeding, bruising easily, and swollen nodes. ENDOCRINE: Negative for cold or heat intolerance, polyuria, polydipsia and goiter. NEURO: negative for tremor, gait imbalance, syncope and seizures. The remainder of the review of systems is noncontributory.   Physical Exam: BP 136/74 (BP Location: Right Arm, Patient Position: Sitting, Cuff Size: Large)   Pulse 82   Temp 98.6 F (37 C) (Oral)   Ht 5\' 10"  (1.778 m)   Wt 204 lb (92.5 kg)   BMI 29.27 kg/m  GENERAL: The patient is AO x3, in no acute distress. HEENT: Head is normocephalic and atraumatic. EOMI are intact. Mouth is well hydrated and without lesions. NECK: Supple. No masses LUNGS: Clear to auscultation. No presence of rhonchi/wheezing/rales. Adequate chest expansion HEART: RRR, normal s1 and s2. ABDOMEN: Soft, nontender, no guarding, no peritoneal signs, and nondistended. BS +. No masses. EXTREMITIES: Without any cyanosis, clubbing, rash, lesions or edema. NEUROLOGIC: AOx3, no focal motor deficit. SKIN: no jaundice, no rashes  Imaging/Labs: as above  I personally reviewed and interpreted the available labs, imaging and endoscopic files.  Impression and Plan: DEVARIOUS PAVEK is a 87 y.o. male with PMH stage 3 lung adenocarcinoma s/p lung resection and CTX, afib, DM, stroke, HLD, GERD, diverticulitis, who presents for follow up of positive fecal occult blood test and intermittent dysphagia.  The patient.  He came to our clinic for evaluation of possible fecal occult blood test but due to other ongoing medical issues he could not proceed with his previous EGD and colonoscopy.  However, he  recently tested positive again for fecal occult blood for which he will like to proceed with his  previous testing.  He has not presented any major gastrointestinal bleeding or other complaints.  I consider undergoing an EGD and colonoscopy is adequate but I advised the patient that if this testing is unremarkable I would not recommend having more stool testing for occult blood as this is a test that is performed for screening purposes.  The esophagogastroduodenospy will also help [place] evaluate his dysphagia/choking episodes.  We will proceed with these investigations.  The patient understood and agreed.  - Schedule EGD and colonoscopy  All questions were answered.      Harvel Quale, MD Gastroenterology and Hepatology [facility]

## 2020-12-23 NOTE — Patient Instructions (Addendum)
[name]  12/23/2020     @PREFPERIOPPHARMACY @   Your procedure is scheduled on  12/30/2020.   Report to [facility] at  1030  A.M.   Call this number if you have problems the morning of surgery:  [phone]   Remember:  Follow the diet and prep instructions given to you by the office.  DO NOT take any medications for diabetes the morning of your procedure.    Take these medicines the morning of surgery with A SIP OF WATER     claritin, meclizine, protonix.     Do not wear jewelry, make-up or nail polish.  Do not wear lotions, powders, or perfumes, or deodorant.  Do not shave 48 hours prior to surgery.  Men may shave face and neck.  Do not bring valuables to the hospital.  [facility] is not responsible for any belongings or valuables.  Contacts, dentures or bridgework may not be worn into surgery.  Leave your suitcase in the car.  After surgery it may be brought to your room.  For patients admitted to the hospital, discharge time will be determined by your treatment team.  Patients discharged the day of surgery will not be allowed to drive home and must have someone with them for 24 hours.    Special instructions:     DO NOT smoke tobacco or vape for 24 hours before your procedure.  Please read over the following fact sheets that you were given. Anesthesia Post-op Instructions and Care and Recovery After Surgery      Upper Endoscopy, Adult, Care After This sheet gives you information about how to care for yourself after your procedure. Your health care provider may also give you more specific instructions. If you have problems or questions, contact your health careprovider. What can I expect after the procedure? After the procedure, it is common to have: A sore throat. Mild stomach pain or discomfort. Bloating. Nausea. Follow these instructions at home:  Follow instructions from your health care provider about what to eat or drink after your  procedure. Return to your normal activities as told by your health care provider. Ask your health care provider what activities are safe for you. Take over-the-counter and prescription medicines only as told by your health care provider. If you were given a sedative during the procedure, it can affect you for several hours. Do not drive or operate machinery until your health care provider says that it is safe. Keep all follow-up visits as told by your health care provider. This is important. Contact a health care provider if you have: A sore throat that lasts longer than one day. Trouble swallowing. Get help right away if: You vomit blood or your vomit looks like coffee grounds. You have: A fever. Bloody, black, or tarry stools. A severe sore throat or you cannot swallow. Difficulty breathing. Severe pain in your chest or abdomen. Summary After the procedure, it is common to have a sore throat, mild stomach discomfort, bloating, and nausea. If you were given a sedative during the procedure, it can affect you for several hours. Do not drive or operate machinery until your health care provider says that it is safe. Follow instructions from your health care provider about what to eat or drink after your procedure. Return to your normal activities as told by your health care provider. This information is not intended to replace advice given to you by your health care provider.  Make sure you discuss any questions you have with your healthcare provider. Document Revised: 05/26/2019 Document Reviewed: 10/28/2017 Elsevier Patient Education  2022 [place]. https://www.asge.org/home/for-patients/patient-information/understanding-eso-dilation-updated">  Esophageal Dilatation Esophageal dilatation, also called esophageal dilation, is a procedure to widen or open a blocked or narrowed part of the esophagus. The esophagus is the part of the body that moves food and liquid from the mouth to the  stomach. You may need this procedure if: You have a buildup of scar tissue in your esophagus that makes it difficult, painful, or impossible to swallow. This can be caused by gastroesophageal reflux disease (GERD). You have cancer of the esophagus. There is a problem with how food moves through your esophagus. In some cases, you may need this procedure repeated at a later time to dilatethe esophagus gradually. Tell a health care provider about: Any allergies you have. All medicines you are taking, including vitamins, herbs, eye drops, creams, and over-the-counter medicines. Any problems you or family members have had with anesthetic medicines. Any blood disorders you have. Any surgeries you have had. Any medical conditions you have. Any antibiotic medicines you are required to take before dental procedures. Whether you are pregnant or may be pregnant. What are the risks? Generally, this is a safe procedure. However, problems may occur, including: Bleeding due to a tear in the lining of the esophagus. A hole, or perforation, in the esophagus. What happens before the procedure? Ask your health care provider about: Changing or stopping your regular medicines. This is especially important if you are taking diabetes medicines or blood thinners. Taking medicines such as aspirin and ibuprofen. These medicines can thin your blood. Do not take these medicines unless your health care provider tells you to take them. Taking over-the-counter medicines, vitamins, herbs, and supplements. Follow instructions from your health care provider about eating or drinking restrictions. Plan to have a responsible adult take you home from the hospital or clinic. Plan to have a responsible adult care for you for the time you are told after you leave the hospital or clinic. This is important. What happens during the procedure? You may be given a medicine to help you relax (sedative). A numbing medicine may be  sprayed into the back of your throat, or you may gargle the medicine. Your health care provider may perform the dilatation using various surgical instruments, such as: Simple dilators. This instrument is carefully placed in the esophagus to stretch it. Guided wire bougies. This involves using an endoscope to insert a wire into the esophagus. A dilator is passed over this wire to enlarge the esophagus. Then the wire is removed. Balloon dilators. An endoscope with a small balloon is inserted into the esophagus. The balloon is inflated to stretch the esophagus and open it up. The procedure may vary among health care providers and hospitals. What can I expect after the procedure? Your blood pressure, heart rate, breathing rate, and blood oxygen level will be monitored until you leave the hospital or clinic. Your throat may feel slightly sore and numb. This will get better over time. You will not be allowed to eat or drink until your throat is no longer numb. When you are able to drink, urinate, and sit on the edge of the bed without nausea or dizziness, you may be able to return home. Follow these instructions at home: Take over-the-counter and prescription medicines only as told by your health care provider. If you were given a sedative during the procedure, it can affect you for  several hours. Do not drive or operate machinery until your health care provider says that it is safe. Plan to have a responsible adult care for you for the time you are told. This is important. Follow instructions from your health care provider about any eating or drinking restrictions. Do not use any products that contain nicotine or tobacco, such as cigarettes, e-cigarettes, and chewing tobacco. If you need help quitting, ask your health care provider. Keep all follow-up visits. This is important. Contact a health care provider if: You have a fever. You have pain that is not relieved by medicine. Get help right away  if: You have chest pain. You have trouble breathing. You have trouble swallowing. You vomit blood. You have black, tarry, or bloody stools. These symptoms may represent a serious problem that is an emergency. Do not wait to see if the symptoms will go away. Get medical help right away. Call your local emergency services (911 in the [place] Do not drive yourself to the hospital. Summary Esophageal dilatation, also called esophageal dilation, is a procedure to widen or open a blocked or narrowed part of the esophagus. Plan to have a responsible adult take you home from the hospital or clinic. For this procedure, a numbing medicine may be sprayed into the back of your throat, or you may gargle the medicine. Do not drive or operate machinery until your health care provider says that it is safe. This information is not intended to replace advice given to you by your health care provider. Make sure you discuss any questions you have with your healthcare provider. Document Revised: 10/14/2019 Document Reviewed: 10/14/2019 Elsevier Patient Education  [place]. Colonoscopy, Adult, Care After This sheet gives you information about how to care for yourself after your procedure. Your health care provider may also give you more specific instructions. If you have problems or questions, contact your health careprovider. What can I expect after the procedure? After the procedure, it is common to have: A small amount of blood in your stool for 24 hours after the procedure. Some gas. Mild cramping or bloating of your abdomen. Follow these instructions at home: Eating and drinking  Drink enough fluid to keep your urine pale yellow. Follow instructions from your health care provider about eating or drinking restrictions. Resume your normal diet as instructed by your health care provider. Avoid heavy or fried foods that are hard to digest.  Activity Rest as told by your health care provider. Avoid  sitting for a long time without moving. Get up to take short walks every 1-2 hours. This is important to improve blood flow and breathing. Ask for help if you feel weak or unsteady. Return to your normal activities as told by your health care provider. Ask your health care provider what activities are safe for you. Managing cramping and bloating  Try walking around when you have cramps or feel bloated. Apply heat to your abdomen as told by your health care provider. Use the heat source that your health care provider recommends, such as a moist heat pack or a heating pad. Place a towel between your skin and the heat source. Leave the heat on for 20-30 minutes. Remove the heat if your skin turns bright red. This is especially important if you are unable to feel pain, heat, or cold. You may have a greater risk of getting burned.  General instructions If you were given a sedative during the procedure, it can affect you for several hours. Do  not drive or operate machinery until your health care provider says that it is safe. For the first 24 hours after the procedure: Do not sign important documents. Do not drink alcohol. Do your regular daily activities at a slower pace than normal. Eat soft foods that are easy to digest. Take over-the-counter and prescription medicines only as told by your health care provider. Keep all follow-up visits as told by your health care provider. This is important. Contact a health care provider if: You have blood in your stool 2-3 days after the procedure. Get help right away if you have: More than a small spotting of blood in your stool. Large blood clots in your stool. Swelling of your abdomen. Nausea or vomiting. A fever. Increasing pain in your abdomen that is not relieved with medicine. Summary After the procedure, it is common to have a small amount of blood in your stool. You may also have mild cramping and bloating of your abdomen. If you were given a  sedative during the procedure, it can affect you for several hours. Do not drive or operate machinery until your health care provider says that it is safe. Get help right away if you have a lot of blood in your stool, nausea or vomiting, a fever, or increased pain in your abdomen. This information is not intended to replace advice given to you by your health care provider. Make sure you discuss any questions you have with your healthcare provider. Document Revised: 05/22/2019 Document Reviewed: 12/22/2018 Elsevier Patient Education  [place] After This sheet gives you information about how to care for yourself after your procedure. Your health care provider may also give you more specific instructions. If you have problems or questions, contact your health careprovider. What can I expect after the procedure? After the procedure, it is common to have: Tiredness. Forgetfulness about what happened after the procedure. Impaired judgment for important decisions. Nausea or vomiting. Some difficulty with balance. Follow these instructions at home: For the time period you were told by your health care provider:     Rest as needed. Do not participate in activities where you could fall or become injured. Do not drive or use machinery. Do not drink alcohol. Do not take sleeping pills or medicines that cause drowsiness. Do not make important decisions or sign legal documents. Do not take care of children on your own. Eating and drinking Follow the diet that is recommended by your health care provider. Drink enough fluid to keep your urine pale yellow. If you vomit: Drink water, juice, or soup when you can drink without vomiting. Make sure you have little or no nausea before eating solid foods. General instructions Have a responsible adult stay with you for the time you are told. It is important to have someone help care for you until you are awake and  alert. Take over-the-counter and prescription medicines only as told by your health care provider. If you have sleep apnea, surgery and certain medicines can increase your risk for breathing problems. Follow instructions from your health care provider about wearing your sleep device: Anytime you are sleeping, including during daytime naps. While taking prescription pain medicines, sleeping medicines, or medicines that make you drowsy. Avoid smoking. Keep all follow-up visits as told by your health care provider. This is important. Contact a health care provider if: You keep feeling nauseous or you keep vomiting. You feel light-headed. You are still sleepy or having trouble with balance after  24 hours. You develop a rash. You have a fever. You have redness or swelling around the IV site. Get help right away if: You have trouble breathing. You have new-onset confusion at home. Summary For several hours after your procedure, you may feel tired. You may also be forgetful and have poor judgment. Have a responsible adult stay with you for the time you are told. It is important to have someone help care for you until you are awake and alert. Rest as told. Do not drive or operate machinery. Do not drink alcohol or take sleeping pills. Get help right away if you have trouble breathing, or if you suddenly become confused. This information is not intended to replace advice given to you by your health care provider. Make sure you discuss any questions you have with your healthcare provider. Document Revised: 02/11/2020 Document Reviewed: 04/30/2019 Elsevier Patient Education  2022 [employer].

## 2020-12-27 ENCOUNTER — Encounter (HOSPITAL_COMMUNITY)
Admission: RE | Admit: 2020-12-27 | Discharge: 2020-12-27 | Disposition: A | Discharge status: Home / Self Care | Payer: Medicare Other | Source: Ambulatory Visit | Attending: Gastroenterology | Admitting: Gastroenterology

## 2020-12-27 ENCOUNTER — Other Ambulatory Visit: Payer: Self-pay

## 2020-12-27 DIAGNOSIS — Z01818 Encounter for other preprocedural examination: Secondary | ICD-10-CM | POA: Diagnosis present

## 2020-12-27 LAB — BASIC METABOLIC PANEL
Anion gap: 7 (ref 5–15)
BUN: 12 mg/dL (ref 8–23)
CO2: 28 mmol/L (ref 22–32)
Calcium: 8.9 mg/dL (ref 8.9–10.3)
Chloride: 97 mmol/L — ABNORMAL LOW (ref 98–111)
Creatinine, Ser: 1.07 mg/dL (ref 0.61–1.24)
GFR, Estimated: >60 mL/min (ref >60)
Glucose, Bld: 105 mg/dL — ABNORMAL HIGH (ref 70–99)
Potassium: 3.9 mmol/L (ref 3.5–5.1)
Sodium: 132 mmol/L — ABNORMAL LOW (ref 135–145)

## 2020-12-30 ENCOUNTER — Ambulatory Visit (HOSPITAL_COMMUNITY)
Admission: RE | Admit: 2020-12-30 | Discharge: 2020-12-30 | Disposition: A | Discharge status: Home / Self Care | Payer: Medicare Other | Attending: Gastroenterology | Admitting: Gastroenterology

## 2020-12-30 ENCOUNTER — Ambulatory Visit (HOSPITAL_COMMUNITY): Payer: Medicare Other | Admitting: Anesthesiology

## 2020-12-30 ENCOUNTER — Encounter (HOSPITAL_COMMUNITY)
Admission: RE | Disposition: A | Discharge status: Home / Self Care | Payer: Self-pay | Source: Home / Self Care | Attending: Gastroenterology

## 2020-12-30 ENCOUNTER — Encounter (HOSPITAL_COMMUNITY): Payer: Self-pay | Admitting: Gastroenterology

## 2020-12-30 ENCOUNTER — Other Ambulatory Visit: Payer: Self-pay

## 2020-12-30 DIAGNOSIS — K2970 Gastritis, unspecified, without bleeding: Secondary | ICD-10-CM

## 2020-12-30 DIAGNOSIS — E119 Type 2 diabetes mellitus without complications: Secondary | ICD-10-CM | POA: Insufficient documentation

## 2020-12-30 DIAGNOSIS — D123 Benign neoplasm of transverse colon: Secondary | ICD-10-CM

## 2020-12-30 DIAGNOSIS — D124 Benign neoplasm of descending colon: Secondary | ICD-10-CM | POA: Diagnosis not present

## 2020-12-30 DIAGNOSIS — Z8673 Personal history of transient ischemic attack (TIA), and cerebral infarction without residual deficits: Secondary | ICD-10-CM | POA: Diagnosis not present

## 2020-12-30 DIAGNOSIS — E785 Hyperlipidemia, unspecified: Secondary | ICD-10-CM | POA: Insufficient documentation

## 2020-12-30 DIAGNOSIS — Z79899 Other long term (current) drug therapy: Secondary | ICD-10-CM | POA: Diagnosis not present

## 2020-12-30 DIAGNOSIS — D120 Benign neoplasm of cecum: Secondary | ICD-10-CM

## 2020-12-30 DIAGNOSIS — K5730 Diverticulosis of large intestine without perforation or abscess without bleeding: Secondary | ICD-10-CM | POA: Diagnosis not present

## 2020-12-30 DIAGNOSIS — Z885 Allergy status to narcotic agent status: Secondary | ICD-10-CM | POA: Diagnosis not present

## 2020-12-30 DIAGNOSIS — Z902 Acquired absence of lung [part of]: Secondary | ICD-10-CM | POA: Diagnosis not present

## 2020-12-30 DIAGNOSIS — Z7984 Long term (current) use of oral hypoglycemic drugs: Secondary | ICD-10-CM | POA: Insufficient documentation

## 2020-12-30 DIAGNOSIS — Z888 Allergy status to other drugs, medicaments and biological substances status: Secondary | ICD-10-CM | POA: Insufficient documentation

## 2020-12-30 DIAGNOSIS — Z85118 Personal history of other malignant neoplasm of bronchus and lung: Secondary | ICD-10-CM | POA: Diagnosis not present

## 2020-12-30 DIAGNOSIS — R195 Other fecal abnormalities: Secondary | ICD-10-CM

## 2020-12-30 DIAGNOSIS — R1310 Dysphagia, unspecified: Secondary | ICD-10-CM

## 2020-12-30 DIAGNOSIS — K5900 Constipation, unspecified: Secondary | ICD-10-CM | POA: Diagnosis not present

## 2020-12-30 DIAGNOSIS — K219 Gastro-esophageal reflux disease without esophagitis: Secondary | ICD-10-CM | POA: Diagnosis not present

## 2020-12-30 DIAGNOSIS — Z7982 Long term (current) use of aspirin: Secondary | ICD-10-CM | POA: Diagnosis not present

## 2020-12-30 DIAGNOSIS — D122 Benign neoplasm of ascending colon: Secondary | ICD-10-CM | POA: Diagnosis not present

## 2020-12-30 DIAGNOSIS — K222 Esophageal obstruction: Secondary | ICD-10-CM | POA: Insufficient documentation

## 2020-12-30 DIAGNOSIS — K5520 Angiodysplasia of colon without hemorrhage: Secondary | ICD-10-CM | POA: Diagnosis not present

## 2020-12-30 HISTORY — PX: BIOPSY: SHX5522

## 2020-12-30 HISTORY — PX: ESOPHAGEAL DILATION: SHX303

## 2020-12-30 HISTORY — PX: ESOPHAGOGASTRODUODENOSCOPY (EGD) WITH PROPOFOL: SHX5813

## 2020-12-30 HISTORY — PX: COLONOSCOPY WITH PROPOFOL: SHX5780

## 2020-12-30 HISTORY — PX: POLYPECTOMY: SHX5525

## 2020-12-30 LAB — HM COLONOSCOPY

## 2020-12-30 LAB — GLUCOSE, CAPILLARY: Glucose-Capillary: 153 mg/dL — ABNORMAL HIGH (ref 70–99)

## 2020-12-30 SURGERY — COLONOSCOPY WITH PROPOFOL
Anesthesia: General

## 2020-12-30 MED ORDER — PROPOFOL 10 MG/ML IV BOLUS
INTRAVENOUS | Status: AC
  Filled 2020-12-30: qty 100

## 2020-12-30 MED ORDER — PROPOFOL 10 MG/ML IV BOLUS
INTRAVENOUS | Status: DC | PRN
  Administered 2020-12-30 (×4): 30 mg via INTRAVENOUS
  Administered 2020-12-30: 40 mg via INTRAVENOUS
  Administered 2020-12-30: 50 mg via INTRAVENOUS
  Administered 2020-12-30: 80 mg via INTRAVENOUS
  Administered 2020-12-30 (×2): 30 mg via INTRAVENOUS

## 2020-12-30 MED ORDER — LACTATED RINGERS IV SOLN: INTRAVENOUS | Status: DC

## 2020-12-30 MED ORDER — LIDOCAINE HCL (PF) 2 % IJ SOLN
INTRAMUSCULAR | Status: AC
  Filled 2020-12-30: qty 15

## 2020-12-30 NOTE — Op Note (Addendum)
[facility] Patient Name: [name] Procedure Date: 12/30/2020 11:15 AM MRN: [id number] Date of Birth: 09/04/1930 Attending MD: Maylon Peppers ,  CSN: [id number] Age: 87 Admit Type: Outpatient Procedure:                Upper GI endoscopy Indications:              Dysphagia Providers:                Maylon Peppers, Lambert Mody, Kristine L.                            Risa Grill, Technician Referring MD:              Medicines:                Monitored Anesthesia Care Complications:            No immediate complications. Estimated Blood Loss:     Estimated blood loss: none. Procedure:                Pre-Anesthesia Assessment:                           - Prior to the procedure, a History and Physical                            was performed, and patient medications, allergies                            and sensitivities were reviewed. The patient's                            tolerance of previous anesthesia was reviewed.                           - The risks and benefits of the procedure and the                            sedation options and risks were discussed with the                            patient. All questions were answered and informed                            consent was obtained.                           - ASA Grade Assessment: III - A patient with severe                            systemic disease.                           After obtaining informed consent, the endoscope was                            passed under direct vision. Throughout the  procedure, the patient's blood pressure, pulse, and                            oxygen saturations were monitored continuously. The                            GIF-H190 ([id number]) scope was introduced through the                            mouth, and advanced to the second part of duodenum.                            The upper GI endoscopy was accomplished without                             difficulty. The patient tolerated the procedure                            well. Scope In: 11:29:42 AM Scope Out: 11:40:25 AM Total Procedure Duration: 0 hours 10 minutes 43 seconds  Findings:      A non-obstructing Schatzki ring was found at the gastroesophageal       junction. Initial disruption was achieved with use of a cold forceps. A       TTS dilator was passed through the scope. Dilation with a 15-16.5-18 mm       balloon dilator was performed to 18 mm.      Diffuse mild inflammation characterized by erythema was found in the       entire examined stomach.      The examined duodenum was normal. Impression:               - Non-obstructing Schatzki ring. Dilated.                           - Gastritis.                           - Normal examined duodenum.                           - No specimens collected. Moderate Sedation:      Per Anesthesia Care Recommendation:           - Discharge patient to home (ambulatory).                           - Resume previous diet.                           - Continue present medications. Procedure Code(s):        --- Professional ---                           [phone], Esophagogastroduodenoscopy, flexible,                            transoral; with transendoscopic balloon dilation of  esophagus (less than 30 mm diameter) Diagnosis Code(s):        --- Professional ---                           K22.2, Esophageal obstruction                           K29.70, Gastritis, unspecified, without bleeding                           R13.10, Dysphagia, unspecified CPT copyright 2019 American [employer]. All rights reserved. The codes documented in this report are preliminary and upon coder review may  be revised to meet current compliance requirements. Maylon Peppers, MD Maylon Peppers,  12/30/2020 12:26:24 PM This report has been signed electronically. Number of Addenda: 0

## 2020-12-30 NOTE — Op Note (Signed)
[facility] Patient Name: [name] Procedure Date: 12/30/2020 11:41 AM MRN: [id number] Date of Birth: 03-13-1931 Attending MD: Maylon Peppers ,  CSN: [id number] Age: 87 Admit Type: Outpatient Procedure:                Colonoscopy Indications:              Positive FOB test Providers:                Maylon Peppers, Lambert Mody, Janeece Riggers,                            RN, Kristine L. Risa Grill, Technician, Casimer Bilis, [employer] Referring MD:              Medicines:                Monitored Anesthesia Care Complications:            No immediate complications. Estimated Blood Loss:     Estimated blood loss: none. Procedure:                Pre-Anesthesia Assessment:                           - Prior to the procedure, a History and Physical                            was performed, and patient medications, allergies                            and sensitivities were reviewed. The patient's                            tolerance of previous anesthesia was reviewed.                           - The risks and benefits of the procedure and the                            sedation options and risks were discussed with the                            patient. All questions were answered and informed                            consent was obtained.                           - ASA Grade Assessment: III - A patient with severe                            systemic disease.                           After obtaining informed consent, the colonoscope  was passed under direct vision. Throughout the                            procedure, the patient's blood pressure, pulse, and                            oxygen saturations were monitored continuously. The                            [id number]-HQ190L ([id number]) scope was introduced through                            the anus and advanced to the the terminal ileum.                             The colonoscopy was performed without difficulty.                            The patient tolerated the procedure well. The                            quality of the bowel preparation was adequate. Scope In: 11:44:07 AM Scope Out: 12:22:04 PM Scope Withdrawal Time: 0 hours 24 minutes 3 seconds  Total Procedure Duration: 0 hours 37 minutes 57 seconds  Findings:      The perianal and digital rectal examinations were normal.      The terminal ileum appeared normal.      A single small angiodysplastic lesion without bleeding was found in the       cecum.      Seven sessile polyps were found in the descending colon, transverse       colon, ascending colon and cecum. The polyps were 3 to 6 mm in size.       These polyps were removed with a cold snare. Resection and retrieval       were complete.      Three sessile polyps were found in the transverse colon and cecum. The       polyps were 1 to 2 mm in size. These polyps were removed with a cold       biopsy forceps. Resection and retrieval were complete.      Multiple small and large-mouthed diverticula were found in the sigmoid       colon and descending colon.      The retroflexed view of the distal rectum and anal verge was normal and       showed no anal or rectal abnormalities. Impression:               - The examined portion of the ileum was normal.                           - A single non-bleeding colonic angiodysplastic                            lesion.                           - Seven 3 to  6 mm polyps in the descending colon,                            in the transverse colon, in the ascending colon and                            in the cecum, removed with a cold snare. Resected                            and retrieved.                           - Three 1 to 2 mm polyps in the transverse colon                            and in the cecum, removed with a cold biopsy                            forceps. Resected and retrieved.                            - Diverticulosis in the sigmoid colon and in the                            descending colon.                           - The distal rectum and anal verge are normal on                            retroflexion view. Moderate Sedation:      Per Anesthesia Care Recommendation:           - Discharge patient to home (ambulatory).                           - Resume previous diet.                           - Await pathology results.                           - Repeat colonoscopy is not recommended due to                            current age (31 years or older) for screening                            purposes. Procedure Code(s):        --- Professional ---                           [id number], Colonoscopy, flexible; with removal of                            tumor(s), polyp(s), or  other lesion(s) by snare                            technique                           45380, 59, Colonoscopy, flexible; with biopsy,                            single or multiple Diagnosis Code(s):        --- Professional ---                           K55.20, Angiodysplasia of colon without hemorrhage                           K63.5, Polyp of colon                           R19.5, Other fecal abnormalities                           K57.30, Diverticulosis of large intestine without                            perforation or abscess without bleeding CPT copyright 2019 American Medical Association. All rights reserved. The codes documented in this report are preliminary and upon coder review may  be revised to meet current compliance requirements. Maylon Peppers, MD Maylon Peppers,  12/30/2020 12:30:53 PM This report has been signed electronically. Number of Addenda: 0

## 2020-12-30 NOTE — Anesthesia Postprocedure Evaluation (Signed)
Anesthesia Post Note  Patient: [name]  Procedure(s) Performed: COLONOSCOPY WITH PROPOFOL ESOPHAGOGASTRODUODENOSCOPY (EGD) WITH PROPOFOL ESOPHAGEAL DILATION BIOPSY POLYPECTOMY  Patient location during evaluation: Phase II Anesthesia Type: General Level of consciousness: awake and alert and oriented Pain management: pain level controlled Vital Signs Assessment: post-procedure vital signs reviewed and stable Respiratory status: spontaneous breathing and respiratory function stable Cardiovascular status: blood pressure returned to baseline and stable Postop Assessment: no apparent nausea or vomiting Anesthetic complications: no   No notable events documented.   Last Vitals:  Vitals:   12/30/20 1028 12/30/20 1227  BP: (!) 147/65 (!) 124/56  Pulse:  78  Resp: (!) 22 (!) 21  Temp: (!) 36.4 C 36.6 C  SpO2: 95% 96%    Last Pain:  Vitals:   12/30/20 1227  TempSrc: Axillary  PainSc: Asleep                 Mechille Varghese C Kason Benak

## 2020-12-30 NOTE — Anesthesia Preprocedure Evaluation (Signed)
Anesthesia Evaluation  Patient identified by MRN, date of birth, ID band Patient awake    Reviewed: Allergy & Precautions, NPO status , Patient's Chart, lab work & pertinent test results  History of Anesthesia Complications Negative for: history of anesthetic complications  Airway Mallampati: II  TM Distance: >3 FB Neck ROM: Full    Dental  (+) Edentulous Upper, Edentulous Lower   Pulmonary shortness of breath and with exertion, former smoker,  Adenocarcinoma of right lung, lobectomy   Pulmonary exam normal breath sounds clear to auscultation       Cardiovascular + dysrhythmias (transient after postop) Atrial Fibrillation  Rhythm:Irregular Rate:Abnormal - Systolic murmurs, - Diastolic murmurs, - Friction Rub, - Carotid Bruit, - Peripheral Edema and - Systolic Click    Neuro/Psych CVA negative psych ROS   GI/Hepatic Neg liver ROS, GERD  Medicated,  Endo/Other  diabetes, Well Controlled, Type 2, Oral Hypoglycemic Agents  Renal/GU negative Renal ROS     Musculoskeletal  (+) Arthritis , Back sx   Abdominal   Peds  Hematology negative hematology ROS (+)   Anesthesia Other Findings   Reproductive/Obstetrics                           Anesthesia Physical Anesthesia Plan  ASA: 3  Anesthesia Plan: General   Post-op Pain Management:    Induction: Intravenous  PONV Risk Score and Plan: Propofol infusion  Airway Management Planned: Nasal Cannula and Natural Airway  Additional Equipment:   Intra-op Plan:   Post-operative Plan:   Informed Consent: I have reviewed the patients History and Physical, chart, labs and discussed the procedure including the risks, benefits and alternatives for the proposed anesthesia with the patient or authorized representative who has indicated his/her understanding and acceptance.     Dental advisory given  Plan Discussed with: CRNA and Surgeon  Anesthesia  Plan Comments:         Anesthesia Quick Evaluation

## 2020-12-30 NOTE — Transfer of Care (Signed)
Immediate Anesthesia Transfer of Care Note  Patient: [name]  Procedure(s) Performed: COLONOSCOPY WITH PROPOFOL ESOPHAGOGASTRODUODENOSCOPY (EGD) WITH PROPOFOL ESOPHAGEAL DILATION BIOPSY POLYPECTOMY  Patient Location: Short Stay  Anesthesia Type:General  Level of Consciousness: awake, alert  and oriented  Airway & Oxygen Therapy: Patient Spontanous Breathing  Post-op Assessment: Report given to RN and Post -op Vital signs reviewed and stable  Post vital signs: Reviewed and stable  Last Vitals:  Vitals Value Taken Time  BP    Temp    Pulse 71 12/30/20  1227  Resp    SpO2 95 12/30/20  1227    Last Pain:  Vitals:   12/30/20 1124  TempSrc:   PainSc: 0-No pain      Patients Stated Pain Goal: 7 (93/96/88 6484)  Complications: No notable events documented.

## 2020-12-30 NOTE — Discharge Instructions (Addendum)
You are being discharged to home.  Resume your previous diet.  Continue your present medications. We are waiting for your pathology results.  Your physician has indicated that a repeat colonoscopy is not recommended due to your current age (57 years or older) for screening purposes.

## 2020-12-30 NOTE — Interval H&P Note (Signed)
History and Physical Interval Note:  12/30/2020 11:27 AM [name] is a 87 y.o. male with PMH stage 3 lung adenocarcinoma s/p lung resection and CTX, afib, DM, stroke, HLD, GERD, diverticulitis, who presents for follow up of positive fecal occult blood test and intermittent dysphagia.  Patient states he has mild dysphagia to solids intermittently.  Denies any nausea, vomiting, fever, chills, melena, hematochezia, heartburn or any other complaints.  BP (!) 147/65   Temp (!) 97.5 F (36.4 C) (Oral)   Resp (!) 22   SpO2 95%  GENERAL: The patient is AO x3, in no acute distress. HEENT: Head is normocephalic and atraumatic. EOMI are intact. Mouth is well hydrated and without lesions. NECK: Supple. No masses LUNGS: Clear to auscultation. No presence of rhonchi/wheezing/rales. Adequate chest expansion HEART: RRR, normal s1 and s2. ABDOMEN: Soft, nontender, no guarding, no peritoneal signs, and nondistended. BS +. No masses. EXTREMITIES: Without any cyanosis, clubbing, rash, lesions or edema. NEUROLOGIC: AOx3, no focal motor deficit. SKIN: no jaundice, no rashes   [name]  has presented today for surgery, with the diagnosis of positive FOBT Dysphagia.  The various methods of treatment have been discussed with the patient and family. After consideration of risks, benefits and other options for treatment, the patient has consented to  Procedure(s) with comments: COLONOSCOPY WITH PROPOFOL (N/A) - 12:15 ESOPHAGOGASTRODUODENOSCOPY (EGD) WITH PROPOFOL (N/A) ESOPHAGEAL DILATION (N/A) as a surgical intervention.  The patient's history has been reviewed, patient examined, no change in status, stable for surgery.  I have reviewed the patient's chart and labs.  Questions were answered to the patient's satisfaction.     [name]

## 2021-01-02 ENCOUNTER — Encounter (INDEPENDENT_AMBULATORY_CARE_PROVIDER_SITE_OTHER): Payer: Self-pay | Admitting: *Deleted

## 2021-01-02 LAB — SURGICAL PATHOLOGY

## 2021-01-04 ENCOUNTER — Encounter (HOSPITAL_COMMUNITY): Payer: Self-pay | Admitting: Gastroenterology

## 2021-01-10 ENCOUNTER — Ambulatory Visit (HOSPITAL_COMMUNITY)
Admission: RE | Admit: 2021-01-10 | Discharge: 2021-01-10 | Disposition: A | Discharge status: Home / Self Care | Payer: Medicare Other | Source: Ambulatory Visit | Attending: Hematology | Admitting: Hematology

## 2021-01-10 ENCOUNTER — Other Ambulatory Visit: Payer: Self-pay

## 2021-01-10 ENCOUNTER — Inpatient Hospital Stay (HOSPITAL_COMMUNITY): Payer: Medicare Other | Attending: Hematology

## 2021-01-10 VITALS — BP 118/57 | HR 73 | Temp 96.8°F | Resp 16

## 2021-01-10 DIAGNOSIS — Z79899 Other long term (current) drug therapy: Secondary | ICD-10-CM | POA: Diagnosis not present

## 2021-01-10 DIAGNOSIS — C3491 Malignant neoplasm of unspecified part of right bronchus or lung: Secondary | ICD-10-CM | POA: Insufficient documentation

## 2021-01-10 DIAGNOSIS — E538 Deficiency of other specified B group vitamins: Secondary | ICD-10-CM | POA: Insufficient documentation

## 2021-01-10 DIAGNOSIS — C3431 Malignant neoplasm of lower lobe, right bronchus or lung: Secondary | ICD-10-CM | POA: Diagnosis not present

## 2021-01-10 DIAGNOSIS — D509 Iron deficiency anemia, unspecified: Secondary | ICD-10-CM | POA: Diagnosis not present

## 2021-01-10 LAB — CBC WITH DIFFERENTIAL/PLATELET
Abs Immature Granulocytes: 0.04 10*3/uL (ref 0.00–0.07)
Basophils Absolute: 0.1 10*3/uL (ref 0.0–0.1)
Basophils Relative: 1 %
Eosinophils Absolute: 1.4 10*3/uL — ABNORMAL HIGH (ref 0.0–0.5)
Eosinophils Relative: 17 %
HCT: 37.5 % — ABNORMAL LOW (ref 39.0–52.0)
Hemoglobin: 12.5 g/dL — ABNORMAL LOW (ref 13.0–17.0)
Immature Granulocytes: 1 %
Lymphocytes Relative: 18 %
Lymphs Abs: 1.6 10*3/uL (ref 0.7–4.0)
MCH: 31 pg (ref 26.0–34.0)
MCHC: 33.3 g/dL (ref 30.0–36.0)
MCV: 93.1 fL (ref 80.0–100.0)
Monocytes Absolute: 0.7 10*3/uL (ref 0.1–1.0)
Monocytes Relative: 8 %
Neutro Abs: 4.7 10*3/uL (ref 1.7–7.7)
Neutrophils Relative %: 55 %
Platelets: 224 10*3/uL (ref 150–400)
RBC: 4.03 MIL/uL — ABNORMAL LOW (ref 4.22–5.81)
RDW: 12.2 % (ref 11.5–15.5)
WBC: 8.4 10*3/uL (ref 4.0–10.5)
nRBC: 0 % (ref 0.0–0.2)

## 2021-01-10 LAB — FERRITIN: Ferritin: 82 ng/mL (ref 24–336)

## 2021-01-10 LAB — COMPREHENSIVE METABOLIC PANEL
ALT: 16 U/L (ref 0–44)
AST: 22 U/L (ref 15–41)
Albumin: 3.2 g/dL — ABNORMAL LOW (ref 3.5–5.0)
Alkaline Phosphatase: 69 U/L (ref 38–126)
Anion gap: 4 — ABNORMAL LOW (ref 5–15)
BUN: 16 mg/dL (ref 8–23)
CO2: 27 mmol/L (ref 22–32)
Calcium: 8.7 mg/dL — ABNORMAL LOW (ref 8.9–10.3)
Chloride: 105 mmol/L (ref 98–111)
Creatinine, Ser: 1.01 mg/dL (ref 0.61–1.24)
GFR, Estimated: >60 mL/min (ref >60)
Glucose, Bld: 134 mg/dL — ABNORMAL HIGH (ref 70–99)
Potassium: 3.6 mmol/L (ref 3.5–5.1)
Sodium: 136 mmol/L (ref 135–145)
Total Bilirubin: 0.6 mg/dL (ref 0.3–1.2)
Total Protein: 6.1 g/dL — ABNORMAL LOW (ref 6.5–8.1)

## 2021-01-10 LAB — IRON AND TIBC
Iron: 67 ug/dL (ref 45–182)
Saturation Ratios: 24 % (ref 17.9–39.5)
TIBC: 276 ug/dL (ref 250–450)
UIBC: 209 ug/dL

## 2021-01-10 LAB — PSA: Prostatic Specific Antigen: 2.91 ng/mL (ref 0.00–4.00)

## 2021-01-10 MED ORDER — SODIUM CHLORIDE 0.9% FLUSH
10.0000 mL | Freq: Once | INTRAVENOUS | Status: AC
  Administered 2021-01-10: 10 mL via INTRAVENOUS

## 2021-01-10 MED ORDER — CYANOCOBALAMIN 1000 MCG/ML IJ SOLN
INTRAMUSCULAR | Status: AC
  Filled 2021-01-10: qty 1

## 2021-01-10 MED ORDER — CYANOCOBALAMIN 1000 MCG/ML IJ SOLN
1000.0000 ug | Freq: Once | INTRAMUSCULAR | Status: AC
  Administered 2021-01-10: 1000 ug via INTRAMUSCULAR

## 2021-01-10 MED ORDER — HEPARIN SOD (PORK) LOCK FLUSH 100 UNIT/ML IV SOLN
500.0000 [IU] | Freq: Once | INTRAVENOUS | Status: AC
  Administered 2021-01-10: 500 [IU] via INTRAVENOUS

## 2021-01-10 NOTE — Patient Instructions (Signed)
[facility]  Discharge Instructions: Thank you for choosing [facility] to provide your oncology and hematology care.  If you have a lab appointment with the Cancer Center, please come in thru the Main Entrance and check in at the main information desk.  Wear comfortable clothing and clothing appropriate for easy access to any Portacath or PICC line.   We strive to give you quality time with your provider. You may need to reschedule your appointment if you arrive late (15 or more minutes).  Arriving late affects you and other patients whose appointments are after yours.  Also, if you miss three or more appointments without notifying the office, you may be dismissed from the clinic at the provider's discretion.      For prescription refill requests, have your pharmacy contact our office and allow 72 hours for refills to be completed.    Today you received the following chemotherapy and/or immunotherapy agents B12      To help prevent nausea and vomiting after your treatment, we encourage you to take your nausea medication as directed.  BELOW ARE SYMPTOMS THAT SHOULD BE REPORTED IMMEDIATELY: *FEVER GREATER THAN 100.4 F (38 C) OR HIGHER *CHILLS OR SWEATING *NAUSEA AND VOMITING THAT IS NOT CONTROLLED WITH YOUR NAUSEA MEDICATION *UNUSUAL SHORTNESS OF BREATH *UNUSUAL BRUISING OR BLEEDING *URINARY PROBLEMS (pain or burning when urinating, or frequent urination) *BOWEL PROBLEMS (unusual diarrhea, constipation, pain near the anus) TENDERNESS IN MOUTH AND THROAT WITH OR WITHOUT PRESENCE OF ULCERS (sore throat, sores in mouth, or a toothache) UNUSUAL RASH, SWELLING OR PAIN  UNUSUAL VAGINAL DISCHARGE OR ITCHING   Items with * indicate a potential emergency and should be followed up as soon as possible or go to the Emergency Department if any problems should occur.  Please show the CHEMOTHERAPY ALERT CARD or IMMUNOTHERAPY ALERT CARD at check-in to the Emergency Department  and triage nurse.  Should you have questions after your visit or need to cancel or reschedule your appointment, please contact [facility] [phone]  and follow the prompts.  Office hours are 8:00 a.m. to 4:30 p.m. Monday - Friday. Please note that voicemails left after 4:00 p.m. may not be returned until the following business day.  We are closed weekends and major holidays. You have access to a nurse at all times for urgent questions. Please call the main number to the clinic [phone] and follow the prompts.  For any non-urgent questions, you may also contact your provider using MyChart. We now offer e-Visits for anyone 18 and older to request care online for non-urgent symptoms. For details visit [email or web address].   Also download the MyChart app! Go to the app store, search "MyChart", open the app, select [place], and log in with your MyChart username and password.  Due to Covid, a mask is required upon entering the hospital/clinic. If you do not have a mask, one will be given to you upon arrival. For doctor visits, patients may have 1 support person aged 18 or older with them. For treatment visits, patients cannot have anyone with them due to current Covid guidelines and our immunocompromised population.  

## 2021-01-10 NOTE — Progress Notes (Signed)
[name] presented for Portacath access and flush.  Portacath located right chest wall accessed with  H 20 needle.  Good blood return present. Portacath flushed with 59ml NS and 500U/32ml Heparin and needle removed intact.  Procedure tolerated well and without incident.     B12 administration without incident; injection site WNL; see MAR for injection details.  Patient tolerated procedure well and without incident.  No questions or complaints noted at this time.   Discharge from clinic ambulatory in stable condition.  Alert and oriented X 3.  Follow up with [facility] as scheduled.

## 2021-01-17 ENCOUNTER — Other Ambulatory Visit: Payer: Self-pay

## 2021-01-17 ENCOUNTER — Inpatient Hospital Stay (HOSPITAL_BASED_OUTPATIENT_CLINIC_OR_DEPARTMENT_OTHER): Payer: Medicare Other | Admitting: Hematology and Oncology

## 2021-01-17 VITALS — BP 124/62 | HR 81 | Temp 97.3°F | Resp 18 | Wt 203.7 lb

## 2021-01-17 DIAGNOSIS — C3491 Malignant neoplasm of unspecified part of right bronchus or lung: Secondary | ICD-10-CM

## 2021-01-17 DIAGNOSIS — E538 Deficiency of other specified B group vitamins: Secondary | ICD-10-CM

## 2021-01-17 DIAGNOSIS — Z95828 Presence of other vascular implants and grafts: Secondary | ICD-10-CM

## 2021-01-17 DIAGNOSIS — C3431 Malignant neoplasm of lower lobe, right bronchus or lung: Secondary | ICD-10-CM | POA: Diagnosis not present

## 2021-01-17 NOTE — Progress Notes (Signed)
[name], [place] [address]   CLINIC:  Medical Oncology/Hematology  PCP:  Monico Blitz, [place] [address] [phone]   REASON FOR VISIT:  Follow-up for right lung cancer  PRIOR THERAPY:  1. Right lower lobectomy on 12/30/2015. 2. Carboplatin and pemetrexed x 6 cycles from 02/16/2016 to 05/31/2016. 3. XRT completed on 08/01/2016. 4. Consolidation durvalumab from 09/04/2016 to 08/28/2017.  NGS Results: Not done  CURRENT THERAPY: Observation  BRIEF ONCOLOGIC HISTORY:  Oncology History  Adenocarcinoma of right lung, stage 3 ([place])  11/10/2015 Imaging   13 mm spiculated lesion seen in right middle lobe.    11/30/2015 PET scan   Spiculated right middle lobe nodule is mildly hypermetabolic and most consistent with a stage IA adenocarcinoma.    12/30/2015 Surgery   Right middle lobectomy and node dissection    12/30/2015 Procedure   Right video-assisted thoracoscopy, Thoracoscopic right middle lobectomy, Mediastinal lymph node dissection, and On-Q local anesthetic catheter placement.    01/02/2016 Pathology Results   1. Lung, resection (segmental or lobe), Right Middle Lobe - INVASIVE ADENOCARCINOMA, WELL DIFFERENTIATED, SPANNING 1.2 CM. - THE SURGICAL RESECTION MARGINS ARE NEGATIVE FOR CARCINOMA. 2. Lymph node, biopsy, Level 12 - METASTATIC CARCINOMA IN 1 OF 1 LYMPH NODE (1/1). 3. Lymph node, biopsy, Level 12 #2 - METASTATIC CARCINOMA IN 1 OF 1 LYMPH NODE (1/1). 4. Lymph node, biopsy, Level 7 - METASTATIC CARCINOMA IN 1 OF 1 LYMPH NODE (1/1/). 5. Lymph node, biopsy, Level 7 #2 - METASTATIC CARCINOMA IN 1 OF 1 LYMPH NODE (1/1). 6. Lymph node, biopsy, Level 7 #3 - METASTATIC CARCINOMA IN 1 OF 1 LYMPH NODE (1/1). 7. Lymph node, biopsy, Level 7 #4 - METASTATIC CARCINOMA IN 1 OF 1 LYMPH NODE (1/1). 8. Lymph node, biopsy, 4R - THERE IS NO EVIDENCE OF CARCINOMA IN 1 OF 1 LYMPH NODE (0/1). 9. Lymph node, biopsy, 4R  #2 - THERE IS NO EVIDENCE OF CARCINOMA IN 1 OF 1 LYMPH NODE (0/1).    01/05/2016 Pathology Results   PDL1 NEGATIVE- tumor proportion score of 0%.     01/05/2016 Pathology Results   Genomic alterations identified: BRAF V600E, KIT amplification, PDGFRA amplification, CDKN2A/B loss, TP53 S1f*33.  Additional findings: MSI-STABLE.  No reportable alterations identified: EGFR, KRAS, ALK, MET, RET, ERBB2, ROS1    02/09/2016 Procedure   Port placed by IR.    02/16/2016 - 05/31/2016 Chemotherapy   Carboplatin/Pemetrexed x 6 cycles      - 08/01/2016 Radiation Therapy   [place], [place]    08/28/2016 Imaging   CT CAP- No evidence of recurrent or metastatic carcinoma within the chest, abdomen, or pelvis.  Stable incidental findings include a absent, small benign right adrenal adenoma, sigmoid diverticulosis, and aortic atherosclerosis.    09/04/2016 -  Chemotherapy   Imfinzi immunotherapy up to 52 weeks.     12/07/2016 Imaging   CT C/A/P: New focal streaky opacities within the peripheral posterior right lower lobe and anteromedial left upper lobe- likely atelectasis with infection or other process is less likely. Recommend attention to these areas on future scans.   Small right pleural effusion, slightly increased from 08/28/2016. No evidence of pleural mass or enhancement.   No evidence of malignancy/ metastatic disease within the abdomen or pelvis.      02/12/2017 Imaging   CT chest: IMPRESSION: 1. Stable CT of the chest. No specific findings identified to suggest residual or recurrence of tumor. 2. Persistent right pleural  effusion. 3. Paramediastinal radiation change predominantly involving the right lung is similar to previous study. 4. Stable right adrenal gland nodule and low-density foci within the liver. 5. Aortic Atherosclerosis (ICD10-I70.0) and Emphysema (ICD10-J43.9). Multi vessel coronary artery calcifications noted.      CANCER STAGING: Cancer  Staging Adenocarcinoma of right lung, stage 3 (HCC) Staging form: Lung, AJCC 7th Edition - Clinical stage from 12/09/2015: Stage IA (T1a, N0, M0) - Signed by Baird Cancer, PA-C on 12/09/2015 - Pathologic stage from 01/20/2016: Stage IIIA (T1a, N2, cM0) - Signed by Baird Cancer, PA-C on 02/01/2016   INTERVAL HISTORY:  [name], a 87 y.o. male, returns for routine follow-up of his right lung cancer. [name] was last seen on 07/20/2020.   On exam today [name] is accompanied by his wife.  He reports that the main issues he been having is with balance and dizziness.  This occurred after he had a small stroke.  He does endorse having some shortness of breath still but that he does well with an incentive spirometer.  He also recently had a colonoscopy which showed some minor polyps.  He was told this will be the last colonoscopy he would have.  Otherwise he is not having any issues with fevers, chills, sweats, nausea, vomiting or diarrhea.  A full 10 point ROS is listed below.   REVIEW OF SYSTEMS:  Review of Systems  Constitutional:  Positive for fatigue (25%). Negative for appetite change.  Gastrointestinal:  Positive for constipation.  Musculoskeletal:  Positive for arthralgias (5/10 R shoulder and axillary pain), back pain (R upper back pain) and neck pain (upper neck pain).  Neurological:  Positive for dizziness.  All other systems reviewed and are negative.  PAST MEDICAL/SURGICAL HISTORY:  Past Medical History:  Diagnosis Date   Adenocarcinoma of lung, stage 3 (HCC)    Stage IIIA   Adenocarcinoma of right lung, stage 3 ([place]) 12/02/2015   Arthritis    Atrial fibrillation, transient (HCC)    transient postop, < 24 hours   B12 deficiency    Cyst of scrotum    Diverticulitis    GERD (gastroesophageal reflux disease)    Hernia    History of pneumonia    Hyperlipidemia    Lung nodule    Spiculated right middle lobe nodule, hypermetabolic   Stroke ([place])    Type 2  diabetes mellitus ([place])    Past Surgical History:  Procedure Laterality Date   BACK SURGERY     BIOPSY  12/30/2020   Procedure: BIOPSY;  Surgeon: Harvel Quale, MD;  [facility];  Service: Gastroenterology;;   CHOLECYSTECTOMY     COLONOSCOPY N/A 11/24/2015   Procedure: COLONOSCOPY;  Surgeon: Rogene Houston, MD;  [facility];  Service: Endoscopy;  Laterality: N/A;  730   COLONOSCOPY W/ POLYPECTOMY     x 5   COLONOSCOPY WITH PROPOFOL N/A 12/30/2020   Procedure: COLONOSCOPY WITH PROPOFOL;  Surgeon: Harvel Quale, MD;  [facility];  Service: Gastroenterology;  Laterality: N/A;  12:15   CYST EXCISION     Scrotum   ESOPHAGEAL DILATION N/A 12/30/2020   Procedure: ESOPHAGEAL DILATION;  Surgeon: Harvel Quale, MD;  [facility];  Service: Gastroenterology;  Laterality: N/A;   ESOPHAGOGASTRODUODENOSCOPY (EGD) WITH PROPOFOL N/A 12/30/2020   Procedure: ESOPHAGOGASTRODUODENOSCOPY (EGD) WITH PROPOFOL;  Surgeon: Harvel Quale, MD;  [facility];  Service: Gastroenterology;  Laterality: N/A;   EYE SURGERY  Bilateral    Cataract with Lens   HERNIA REPAIR Right    Inguinal   IR GENERIC HISTORICAL  02/09/2016   IR US GUIDE VASC ACCESS RIGHT 02/09/2016 Arne Cleveland, MD [facility]   IR GENERIC HISTORICAL  02/09/2016   IR FLUORO GUIDE CV LINE RIGHT 02/09/2016 Arne Cleveland, MD [facility]   LUMBAR DISC SURGERY     per patient report   POLYPECTOMY  12/30/2020   Procedure: POLYPECTOMY;  Surgeon: Harvel Quale, MD;  [facility];  Service: Gastroenterology;;   [place] (VATS)/ LOBECTOMY Right 12/30/2015   Procedure: VIDEO ASSISTED THORACOSCOPY (VATS)/RIGHT MIDDLE LOBECTOMY;  Surgeon: Melrose Nakayama, MD;  Location: [facility];  Service: Thoracic;  Laterality: Right;    SOCIAL HISTORY:  Social History   Socioeconomic History   Marital  status: Married    Spouse name: Not on file   Number of children: Not on file   Years of education: Not on file   Highest education level: Not on file  Occupational History   Not on file  Tobacco Use   Smoking status: Former    Types: Cigarettes    Quit date: 05/27/2004    Years since quitting: 16.6   Smokeless tobacco: Never  Vaping Use   Vaping Use: Never used  Substance and Sexual Activity   Alcohol use: No    Alcohol/week: 0.0 standard drinks   Drug use: No   Sexual activity: Not on file    Comment: married  Other Topics Concern   Not on file  Social History Narrative   Not on file   Social Determinants of Health   Financial Resource Strain: Low Risk    Difficulty of Paying Living Expenses: Not hard at all  Food Insecurity: No Food Insecurity   Worried About Charity fundraiser in the Last Year: Never true   [place] in the Last Year: Never true  Transportation Needs: No Transportation Needs   Lack of Transportation (Medical): No   Lack of Transportation (Non-Medical): No  Physical Activity: Inactive   Days of Exercise per Week: 0 days   Minutes of Exercise per Session: 0 min  Stress: No Stress Concern Present   Feeling of Stress : Only a little  Social Connections: Unknown   Frequency of Communication with Friends and Family: Three times a week   Frequency of Social Gatherings with Friends and Family: Three times a week   Attends Religious Services: Not on file   Active Member of Clubs or Organizations: No   Attends Archivist Meetings: Not on file   Marital Status: Separated  Intimate Partner Violence: Not At Risk   Fear of Current or Ex-Partner: No   Emotionally Abused: No   Physically Abused: No   Sexually Abused: No    FAMILY HISTORY:  Family History  Problem Relation Age of Onset   Colon cancer Brother     CURRENT MEDICATIONS:  Current Outpatient Medications  Medication Sig Dispense Refill   ACCU-CHEK AVIVA PLUS test strip USE  5 STRIPS DAILY TO MONITOR FLUCTUATING BLOOD SUGAR  4   ACCU-CHEK FASTCLIX LANCETS MISC USE 6 LANCETS DAILY  5   ALPRAZolam (XANAX) 0.5 MG tablet Take 0.5 mg by mouth at bedtime.     aspirin EC 81 MG tablet Take 81 mg by mouth daily.     benzonatate (TESSALON) 100 MG capsule Take 100 mg by mouth 3 (three)  times daily as needed for cough.     calcium carbonate (OS-CAL - DOSED IN MG OF ELEMENTAL CALCIUM) 1250 (500 Ca) MG tablet Take 1 tablet (500 mg of elemental calcium total) by mouth daily with breakfast. 30 tablet 2   Carboxymethylcellulose Sodium 0.25 % SOLN Place 1 drop into both eyes daily as needed (Dry eye).     Cholecalciferol (VITAMIN D) 2000 UNITS CAPS Take 4,000 Units by mouth 2 (two) times daily.     cyanocobalamin (,VITAMIN B-12,) 1000 MCG/ML injection Inject 1 mL (1,000 mcg total) into the muscle every 30 (thirty) days. 1 mL 12   gabapentin (NEURONTIN) 100 MG capsule Take 100 mg by mouth at bedtime.     glipiZIDE (GLUCOTROL) 5 MG tablet Take 1 tablet (5 mg total) by mouth 2 (two) times daily before a meal. (Patient taking differently: Take 2.5 mg by mouth 2 (two) times daily before a meal.) 60 tablet 1   lidocaine-prilocaine (EMLA) cream Apply a quarter size amount to port site 1 hour prior to chemo. Do not rub in. Cover with plastic wrap. (Patient taking differently: Apply 1 application topically See admin instructions. Apply a quarter size amount to port site 1 hour prior to chemo. Do not rub in. Cover with plastic wrap.) 30 g 3   lisinopril (ZESTRIL) 5 MG tablet Take 5 mg by mouth daily.     loratadine (CLARITIN) 10 MG tablet Take 10 mg by mouth daily.     Meclizine HCl 25 MG CHEW Chew 25 mg by mouth 3 (three) times daily.     pantoprazole (PROTONIX) 40 MG tablet Take 40 mg by mouth 2 (two) times daily.      simvastatin (ZOCOR) 40 MG tablet Take 40 mg by mouth at bedtime.      terazosin (HYTRIN) 5 MG capsule Take 5 mg by mouth at bedtime.     acetaminophen (TYLENOL) 500 MG tablet  Take 500 mg by mouth every 6 (six) hours as needed for moderate pain, mild pain or headache. (Patient not taking: Reported on 01/17/2021)     zolpidem (AMBIEN) 10 MG tablet Take 5 mg by mouth at bedtime as needed for sleep. (Patient not taking: Reported on 01/17/2021)     No current facility-administered medications for this visit.   Facility-Administered Medications Ordered in Other Visits  Medication Dose Route Frequency Provider Last Rate Last Admin   heparin lock flush 100 unit/mL  500 Units Intravenous Once Derek Jack, MD       sodium chloride flush (NS) 0.9 % injection 10 mL  10 mL Intravenous PRN Derek Jack, MD        ALLERGIES:  Allergies  Allergen Reactions   Morphine And Related Other (See Comments)    confusion   Amitriptyline Other (See Comments)    Confusion   Omeprazole Other (See Comments)    Other reaction(s): Delirium   Temazepam Other (See Comments)    Other reaction(s): Nightmares   Zantac [Ranitidine] Other (See Comments)    Other reaction(s): Delirium    PHYSICAL EXAM:  Performance status (ECOG): 1 - Symptomatic but completely ambulatory  Vitals:   01/17/21 1414  BP: 124/62  Pulse: 81  Resp: 18  Temp: (!) 97.3 F (36.3 C)  SpO2: 93%   Wt Readings from Last 3 Encounters:  01/17/21 203 lb 11.2 oz (92.4 kg)  12/27/20 203 lb 14.8 oz (92.5 kg)  12/22/20 204 lb (92.5 kg)   Physical Exam Vitals reviewed.  Constitutional:  Appearance: Normal appearance.  Cardiovascular:     Rate and Rhythm: Normal rate and regular rhythm.     Pulses: Normal pulses.     Heart sounds: Normal heart sounds.  Pulmonary:     Effort: Pulmonary effort is normal.     Breath sounds: Normal breath sounds.  Chest:  Breasts:    Right: No supraclavicular adenopathy.     Left: No supraclavicular adenopathy.  Abdominal:     Palpations: Abdomen is soft. There is no mass.     Tenderness: There is no abdominal tenderness.  Musculoskeletal:     Cervical back:  No pain with movement, spinous process tenderness or muscular tenderness.  Lymphadenopathy:     Cervical: No cervical adenopathy.     Upper Body:     Right upper body: No supraclavicular adenopathy.     Left upper body: No supraclavicular adenopathy.  Neurological:     General: No focal deficit present.     Mental Status: He is alert and oriented to person, place, and time.  Psychiatric:        Mood and Affect: Mood normal.        Behavior: Behavior normal.     LABORATORY DATA:  I have reviewed the labs as listed.  CBC Latest Ref Rng & Units 01/10/2021 07/13/2020 04/12/2020  WBC 4.0 - 10.5 K/uL 8.4 6.8 8.3  Hemoglobin 13.0 - 17.0 g/dL 12.5(L) 12.6(L) 12.9(L)  Hematocrit 39.0 - 52.0 % 37.5(L) 37.9(L) 39.4  Platelets 150 - 400 K/uL 224 239 304   CMP Latest Ref Rng & Units 01/10/2021 12/27/2020 07/13/2020  Glucose 70 - 99 mg/dL 134(H) 105(H) 162(H)  BUN 8 - 23 mg/dL '16 12 22  ' Creatinine 0.61 - 1.24 mg/dL 1.01 1.07 1.22  Sodium 135 - 145 mmol/L 136 132(L) 139  Potassium 3.5 - 5.1 mmol/L 3.6 3.9 3.7  Chloride 98 - 111 mmol/L 105 97(L) 104  CO2 22 - 32 mmol/L '27 28 25  ' Calcium 8.9 - 10.3 mg/dL 8.7(L) 8.9 8.3(L)  Total Protein 6.5 - 8.1 g/dL 6.1(L) - 6.0(L)  Total Bilirubin 0.3 - 1.2 mg/dL 0.6 - 0.5  Alkaline Phos 38 - 126 U/L 69 - 67  AST 15 - 41 U/L 22 - 26  ALT 0 - 44 U/L 16 - 27   Lab Results  Component Value Date   TIBC 276 01/10/2021   TIBC 255 07/13/2020   TIBC 278 01/07/2020   FERRITIN 82 01/10/2021   FERRITIN 96 07/13/2020   FERRITIN 108 01/07/2020   IRONPCTSAT 24 01/10/2021   IRONPCTSAT 35 07/13/2020   IRONPCTSAT 27 01/07/2020    DIAGNOSTIC IMAGING:  I have independently reviewed the scans and discussed with the patient. CT Chest Wo Contrast  Result Date: 01/11/2021 CLINICAL DATA:  Follow-up right lung cancer, status post right lower lobectomy, chemotherapy, and radiation, follow-up TAA EXAM: CT CHEST WITHOUT CONTRAST TECHNIQUE: Multidetector CT imaging of the chest was  performed following the standard protocol without IV contrast. COMPARISON:  07/13/2020 FINDINGS: Cardiovascular: Right chest port catheter. Aortic atherosclerosis. Normal heart size. Three-vessel coronary artery calcifications. No pericardial effusion. Mediastinum/Nodes: No enlarged mediastinal, hilar, or axillary lymph nodes. Thyroid gland, trachea, and esophagus demonstrate no significant findings. Lungs/Pleura: Small right pleural effusion and associated atelectasis or consolidation. Moderate centrilobular emphysema. Redemonstrated postoperative findings of right middle lobectomy and wedge resection of the posterior right upper lobe. Unchanged perihilar post treatment fibrosis and consolidation. Unchanged 6 mm subpleural subsolid nodule of the dependent left lower lobe (series 4, image  100). Upper Abdomen: No acute abnormality. Stable, definitively benign, fat containing right adrenal adenoma (series 2, image 142). Musculoskeletal: No chest wall mass. Unchanged sclerotic lesion of the inferior sternal body (series 6, image 99). Disc degenerative disease and ankylosis of the thoracic spine. IMPRESSION: 1. Redemonstrated postoperative findings of right middle lobectomy and wedge resection of the posterior right upper lobe. Unchanged perihilar post treatment fibrosis and consolidation. No evidence of malignant recurrence in the chest. 2. Small right pleural effusion and associated atelectasis or consolidation. 3. Unchanged 6 mm subpleural subsolid nodule of the dependent left lower lobe, nonspecific although likely benign and incidental. Attention on follow-up. 4. Unchanged sclerotic lesion of the inferior sternal body, consistent with treated metastasis. 5. Emphysema. 6. Coronary artery disease. Aortic Atherosclerosis (ICD10-I70.0) and Emphysema (ICD10-J43.9). Electronically Signed   By: Eddie Candle M.D.   On: 01/11/2021 15:30      ASSESSMENT:  1.  Stage III right lung adenocarcinoma: -Diagnosed in June 2017,  status post right lower lobectomy, VATS with mediastinal lymph node sampling, PD-L1 negative. -6 cycles of carboplatin and pemetrexed completed on 05/31/2016, XRT completed on 08/01/2016. -Consolidation durvalumab from 09/04/2016 through 08/28/2017. -CT chest on 12/31/2019 showed stable radiation fibrosis in the right perihilar lung.  No evidence of local tumor recurrence.  Enlarging sclerotic lesion in the lower sternum, cannot exclude sclerotic bone metastasis.  No evidence of metastatic disease in the chest.  Trace dependent right pleural effusion has decreased.  Stable at static 4.1 cm ascending thoracic aorta.  Stable right adrenal adenoma.  PLAN:  1.  Stage III right lung adenocarcinoma: -Reviewed CT scan of the chest without contrast from 01/10/2021 which did not show any evidence of recurrence. -Recommend follow-up in 6 months with CT scan and labs.   2.  Iron deficiency anemia: -His ferritin is 82 and hemoglobin is 12.5.  No Feraheme needed.   3.  Vitamin B12 deficiency: -He is continuing B12 shots at home.   Orders placed this encounter:  Orders Placed This Encounter  Procedures   CT Chest Wo Contrast   Ledell Peoples, MD Department of Hematology/Oncology [facility] Phone: [phone] Pager: [phone] Email: [email or web address]

## 2021-01-19 ENCOUNTER — Other Ambulatory Visit (HOSPITAL_COMMUNITY): Payer: Self-pay

## 2021-01-19 DIAGNOSIS — N429 Disorder of prostate, unspecified: Secondary | ICD-10-CM

## 2021-01-19 DIAGNOSIS — E538 Deficiency of other specified B group vitamins: Secondary | ICD-10-CM

## 2021-01-19 DIAGNOSIS — M545 Low back pain, unspecified: Secondary | ICD-10-CM

## 2021-01-19 DIAGNOSIS — C3491 Malignant neoplasm of unspecified part of right bronchus or lung: Secondary | ICD-10-CM

## 2021-02-07 ENCOUNTER — Inpatient Hospital Stay (HOSPITAL_COMMUNITY): Payer: Medicare Other

## 2021-02-07 ENCOUNTER — Other Ambulatory Visit: Payer: Self-pay

## 2021-02-07 VITALS — BP 135/62 | HR 90 | Temp 96.7°F | Resp 18

## 2021-02-07 DIAGNOSIS — C3431 Malignant neoplasm of lower lobe, right bronchus or lung: Secondary | ICD-10-CM | POA: Diagnosis not present

## 2021-02-07 DIAGNOSIS — E538 Deficiency of other specified B group vitamins: Secondary | ICD-10-CM

## 2021-02-07 MED ORDER — CYANOCOBALAMIN 1000 MCG/ML IJ SOLN
1000.0000 ug | Freq: Once | INTRAMUSCULAR | Status: AC
  Administered 2021-02-07: 1000 ug via INTRAMUSCULAR
  Filled 2021-02-07: qty 1

## 2021-02-07 NOTE — Progress Notes (Signed)
Patient presents today for B12 injection per providers order.  Stable during administration without incident; injection site WNL; see MAR for injection details.  Patient tolerated procedure well and without incident.  No questions or complaints noted at this time.  Discharge from clinic ambulatory in stable condition.  Alert and oriented X 3.  Follow up with [facility] as scheduled.

## 2021-02-07 NOTE — Patient Instructions (Signed)
[facility]  Discharge Instructions: Thank you for choosing [facility] to provide your oncology and hematology care.  If you have a lab appointment with the Cancer Center, please come in thru the Main Entrance and check in at the main information desk.  Wear comfortable clothing and clothing appropriate for easy access to any Portacath or PICC line.   We strive to give you quality time with your provider. You may need to reschedule your appointment if you arrive late (15 or more minutes).  Arriving late affects you and other patients whose appointments are after yours.  Also, if you miss three or more appointments without notifying the office, you may be dismissed from the clinic at the provider's discretion.      For prescription refill requests, have your pharmacy contact our office and allow 72 hours for refills to be completed.    Today you received the following chemotherapy and/or immunotherapy agents B12      To help prevent nausea and vomiting after your treatment, we encourage you to take your nausea medication as directed.  BELOW ARE SYMPTOMS THAT SHOULD BE REPORTED IMMEDIATELY: *FEVER GREATER THAN 100.4 F (38 C) OR HIGHER *CHILLS OR SWEATING *NAUSEA AND VOMITING THAT IS NOT CONTROLLED WITH YOUR NAUSEA MEDICATION *UNUSUAL SHORTNESS OF BREATH *UNUSUAL BRUISING OR BLEEDING *URINARY PROBLEMS (pain or burning when urinating, or frequent urination) *BOWEL PROBLEMS (unusual diarrhea, constipation, pain near the anus) TENDERNESS IN MOUTH AND THROAT WITH OR WITHOUT PRESENCE OF ULCERS (sore throat, sores in mouth, or a toothache) UNUSUAL RASH, SWELLING OR PAIN  UNUSUAL VAGINAL DISCHARGE OR ITCHING   Items with * indicate a potential emergency and should be followed up as soon as possible or go to the Emergency Department if any problems should occur.  Please show the CHEMOTHERAPY ALERT CARD or IMMUNOTHERAPY ALERT CARD at check-in to the Emergency Department  and triage nurse.  Should you have questions after your visit or need to cancel or reschedule your appointment, please contact [facility] [phone]  and follow the prompts.  Office hours are 8:00 a.m. to 4:30 p.m. Monday - Friday. Please note that voicemails left after 4:00 p.m. may not be returned until the following business day.  We are closed weekends and major holidays. You have access to a nurse at all times for urgent questions. Please call the main number to the clinic [phone] and follow the prompts.  For any non-urgent questions, you may also contact your provider using MyChart. We now offer e-Visits for anyone 18 and older to request care online for non-urgent symptoms. For details visit [email or web address].   Also download the MyChart app! Go to the app store, search "MyChart", open the app, select [facility], and log in with your MyChart username and password.  Due to Covid, a mask is required upon entering the hospital/clinic. If you do not have a mask, one will be given to you upon arrival. For doctor visits, patients may have 1 support person aged 18 or older with them. For treatment visits, patients cannot have anyone with them due to current Covid guidelines and our immunocompromised population.  

## 2021-03-10 ENCOUNTER — Inpatient Hospital Stay (HOSPITAL_COMMUNITY): Payer: Medicare Other | Attending: Hematology

## 2021-03-10 ENCOUNTER — Other Ambulatory Visit: Payer: Self-pay

## 2021-03-10 VITALS — BP 125/55 | HR 89 | Temp 97.1°F | Resp 18

## 2021-03-10 DIAGNOSIS — C3431 Malignant neoplasm of lower lobe, right bronchus or lung: Secondary | ICD-10-CM | POA: Diagnosis present

## 2021-03-10 DIAGNOSIS — E538 Deficiency of other specified B group vitamins: Secondary | ICD-10-CM

## 2021-03-10 MED ORDER — SODIUM CHLORIDE 0.9% FLUSH
10.0000 mL | Freq: Once | INTRAVENOUS | Status: AC
  Administered 2021-03-10: 10 mL via INTRAVENOUS

## 2021-03-10 MED ORDER — CYANOCOBALAMIN 1000 MCG/ML IJ SOLN
1000.0000 ug | Freq: Once | INTRAMUSCULAR | Status: AC
  Administered 2021-03-10: 1000 ug via INTRAMUSCULAR
  Filled 2021-03-10: qty 1

## 2021-03-10 MED ORDER — HEPARIN SOD (PORK) LOCK FLUSH 100 UNIT/ML IV SOLN
500.0000 [IU] | Freq: Once | INTRAVENOUS | Status: AC
  Administered 2021-03-10: 500 [IU] via INTRAVENOUS

## 2021-03-10 NOTE — Patient Instructions (Signed)
[facility]  Discharge Instructions: Thank you for choosing [facility] to provide your oncology and hematology care.  If you have a lab appointment with the [facility], please come in thru the Main Entrance and check in at the main information desk.  Wear comfortable clothing and clothing appropriate for easy access to any Portacath or PICC line.   We strive to give you quality time with your provider. You may need to reschedule your appointment if you arrive late (15 or more minutes).  Arriving late affects you and other patients whose appointments are after yours.  Also, if you miss three or more appointments without notifying the office, you may be dismissed from the clinic at the provider's discretion.      For prescription refill requests, have your pharmacy contact our office and allow 72 hours for refills to be completed.    Today you received the following chemotherapy and/or immunotherapy agents B12 and port flush      To help prevent nausea and vomiting after your treatment, we encourage you to take your nausea medication as directed.  BELOW ARE SYMPTOMS THAT SHOULD BE REPORTED IMMEDIATELY: *FEVER GREATER THAN 100.4 F (38 C) OR HIGHER *CHILLS OR SWEATING *NAUSEA AND VOMITING THAT IS NOT CONTROLLED WITH YOUR NAUSEA MEDICATION *UNUSUAL SHORTNESS OF BREATH *UNUSUAL BRUISING OR BLEEDING *URINARY PROBLEMS (pain or burning when urinating, or frequent urination) *BOWEL PROBLEMS (unusual diarrhea, constipation, pain near the anus) TENDERNESS IN MOUTH AND THROAT WITH OR WITHOUT PRESENCE OF ULCERS (sore throat, sores in mouth, or a toothache) UNUSUAL RASH, SWELLING OR PAIN  UNUSUAL VAGINAL DISCHARGE OR ITCHING   Items with * indicate a potential emergency and should be followed up as soon as possible or go to the Emergency Department if any problems should occur.  Please show the CHEMOTHERAPY ALERT CARD or IMMUNOTHERAPY ALERT CARD at check-in to the  Emergency Department and triage nurse.  Should you have questions after your visit or need to cancel or reschedule your appointment, please contact [facility] [phone]  and follow the prompts.  Office hours are 8:00 a.m. to 4:30 p.m. Monday - Friday. Please note that voicemails left after 4:00 p.m. may not be returned until the following business day.  We are closed weekends and major holidays. You have access to a nurse at all times for urgent questions. Please call the main number to the clinic [phone] and follow the prompts.  For any non-urgent questions, you may also contact your provider using MyChart. We now offer e-Visits for anyone 72 and older to request care online for non-urgent symptoms. For details visit [email or web address].   Also download the MyChart app! Go to the app store, search "MyChart", open the app, select [facility], and log in with your MyChart username and password.  Due to Covid, a mask is required upon entering the hospital/clinic. If you do not have a mask, one will be given to you upon arrival. For doctor visits, patients may have 1 support person aged 20 or older with them. For treatment visits, patients cannot have anyone with them due to current Covid guidelines and our immunocompromised population.

## 2021-03-10 NOTE — Progress Notes (Signed)
Patients port flushed without difficulty.  Good blood return noted with no bruising or swelling noted at site.  Band aid applied.  VSS with discharge and left in satisfactory condition with no s/s of distress noted.    [name] presents today for B12 injection per the provider's orders.  Stable during administration without incident; injection site WNL; see MAR for injection details.  Patient tolerated procedure well and without incident.  No questions or complaints noted at this time.

## 2021-04-10 ENCOUNTER — Inpatient Hospital Stay (HOSPITAL_COMMUNITY): Payer: Medicare Other | Attending: Hematology

## 2021-04-10 ENCOUNTER — Other Ambulatory Visit: Payer: Self-pay

## 2021-04-10 VITALS — BP 129/67 | HR 84 | Temp 97.2°F | Resp 18

## 2021-04-10 DIAGNOSIS — C342 Malignant neoplasm of middle lobe, bronchus or lung: Secondary | ICD-10-CM | POA: Insufficient documentation

## 2021-04-10 DIAGNOSIS — E538 Deficiency of other specified B group vitamins: Secondary | ICD-10-CM

## 2021-04-10 MED ORDER — CYANOCOBALAMIN 1000 MCG/ML IJ SOLN
1000.0000 ug | Freq: Once | INTRAMUSCULAR | Status: AC
  Administered 2021-04-10: 1000 ug via INTRAMUSCULAR
  Filled 2021-04-10: qty 1

## 2021-04-10 NOTE — Progress Notes (Signed)
Patient presents today for Vitamin B12 infusion.  Patient is in satisfactory condition with no complaints voiced.  Vital signs are stable.  We will proceed with injection per MD orders.   Patient tolerated injection with no complaints voiced.  Site clean and dry with no bruising or swelling noted.  No complaints of pain.  Discharged with vital signs stable and no signs or symptoms of distress noted.

## 2021-04-10 NOTE — Patient Instructions (Signed)
[facility]  Discharge Instructions: Thank you for choosing [facility] to provide your oncology and hematology care.  If you have a lab appointment with the Cancer Center, please come in thru the Main Entrance and check in at the main information desk.  Wear comfortable clothing and clothing appropriate for easy access to any Portacath or PICC line.   We strive to give you quality time with your provider. You may need to reschedule your appointment if you arrive late (15 or more minutes).  Arriving late affects you and other patients whose appointments are after yours.  Also, if you miss three or more appointments without notifying the office, you may be dismissed from the clinic at the provider's discretion.      For prescription refill requests, have your pharmacy contact our office and allow 72 hours for refills to be completed.        To help prevent nausea and vomiting after your treatment, we encourage you to take your nausea medication as directed.  BELOW ARE SYMPTOMS THAT SHOULD BE REPORTED IMMEDIATELY: *FEVER GREATER THAN 100.4 F (38 C) OR HIGHER *CHILLS OR SWEATING *NAUSEA AND VOMITING THAT IS NOT CONTROLLED WITH YOUR NAUSEA MEDICATION *UNUSUAL SHORTNESS OF BREATH *UNUSUAL BRUISING OR BLEEDING *URINARY PROBLEMS (pain or burning when urinating, or frequent urination) *BOWEL PROBLEMS (unusual diarrhea, constipation, pain near the anus) TENDERNESS IN MOUTH AND THROAT WITH OR WITHOUT PRESENCE OF ULCERS (sore throat, sores in mouth, or a toothache) UNUSUAL RASH, SWELLING OR PAIN  UNUSUAL VAGINAL DISCHARGE OR ITCHING   Items with * indicate a potential emergency and should be followed up as soon as possible or go to the Emergency Department if any problems should occur.  Please show the CHEMOTHERAPY ALERT CARD or IMMUNOTHERAPY ALERT CARD at check-in to the Emergency Department and triage nurse.  Should you have questions after your visit or need to cancel  or reschedule your appointment, please contact [facility] [phone]  and follow the prompts.  Office hours are 8:00 a.m. to 4:30 p.m. Monday - Friday. Please note that voicemails left after 4:00 p.m. may not be returned until the following business day.  We are closed weekends and major holidays. You have access to a nurse at all times for urgent questions. Please call the main number to the clinic [phone] and follow the prompts.  For any non-urgent questions, you may also contact your provider using MyChart. We now offer e-Visits for anyone 18 and older to request care online for non-urgent symptoms. For details visit [email or web address].   Also download the MyChart app! Go to the app store, search "MyChart", open the app, select [facility], and log in with your MyChart username and password.  Due to Covid, a mask is required upon entering the hospital/clinic. If you do not have a mask, one will be given to you upon arrival. For doctor visits, patients may have 1 support person aged 18 or older with them. For treatment visits, patients cannot have anyone with them due to current Covid guidelines and our immunocompromised population.  

## 2021-05-10 ENCOUNTER — Other Ambulatory Visit: Payer: Self-pay

## 2021-05-10 ENCOUNTER — Inpatient Hospital Stay (HOSPITAL_COMMUNITY): Payer: Medicare Other | Attending: Hematology

## 2021-05-10 VITALS — BP 128/64 | HR 94 | Temp 96.7°F | Resp 18 | Wt 209.6 lb

## 2021-05-10 DIAGNOSIS — E538 Deficiency of other specified B group vitamins: Secondary | ICD-10-CM

## 2021-05-10 DIAGNOSIS — C342 Malignant neoplasm of middle lobe, bronchus or lung: Secondary | ICD-10-CM | POA: Insufficient documentation

## 2021-05-10 MED ORDER — CYANOCOBALAMIN 1000 MCG/ML IJ SOLN
1000.0000 ug | Freq: Once | INTRAMUSCULAR | Status: AC
  Administered 2021-05-10: 1000 ug via INTRAMUSCULAR
  Filled 2021-05-10: qty 1

## 2021-05-10 NOTE — Patient Instructions (Signed)
[facility]  Discharge Instructions: Thank you for choosing [facility] to provide your oncology and hematology care.  If you have a lab appointment with the Cancer Center, please come in thru the Main Entrance and check in at the main information desk.  Wear comfortable clothing and clothing appropriate for easy access to any Portacath or PICC line.   We strive to give you quality time with your provider. You may need to reschedule your appointment if you arrive late (15 or more minutes).  Arriving late affects you and other patients whose appointments are after yours.  Also, if you miss three or more appointments without notifying the office, you may be dismissed from the clinic at the provider's discretion.      For prescription refill requests, have your pharmacy contact our office and allow 72 hours for refills to be completed.    Today you received the following chemotherapy and/or immunotherapy agents B12      To help prevent nausea and vomiting after your treatment, we encourage you to take your nausea medication as directed.  BELOW ARE SYMPTOMS THAT SHOULD BE REPORTED IMMEDIATELY: *FEVER GREATER THAN 100.4 F (38 C) OR HIGHER *CHILLS OR SWEATING *NAUSEA AND VOMITING THAT IS NOT CONTROLLED WITH YOUR NAUSEA MEDICATION *UNUSUAL SHORTNESS OF BREATH *UNUSUAL BRUISING OR BLEEDING *URINARY PROBLEMS (pain or burning when urinating, or frequent urination) *BOWEL PROBLEMS (unusual diarrhea, constipation, pain near the anus) TENDERNESS IN MOUTH AND THROAT WITH OR WITHOUT PRESENCE OF ULCERS (sore throat, sores in mouth, or a toothache) UNUSUAL RASH, SWELLING OR PAIN  UNUSUAL VAGINAL DISCHARGE OR ITCHING   Items with * indicate a potential emergency and should be followed up as soon as possible or go to the Emergency Department if any problems should occur.  Please show the CHEMOTHERAPY ALERT CARD or IMMUNOTHERAPY ALERT CARD at check-in to the Emergency Department  and triage nurse.  Should you have questions after your visit or need to cancel or reschedule your appointment, please contact [facility] [phone]  and follow the prompts.  Office hours are 8:00 a.m. to 4:30 p.m. Monday - Friday. Please note that voicemails left after 4:00 p.m. may not be returned until the following business day.  We are closed weekends and major holidays. You have access to a nurse at all times for urgent questions. Please call the main number to the clinic [phone] and follow the prompts.  For any non-urgent questions, you may also contact your provider using MyChart. We now offer e-Visits for anyone 18 and older to request care online for non-urgent symptoms. For details visit [email or web address].   Also download the MyChart app! Go to the app store, search "MyChart", open the app, select [facility], and log in with your MyChart username and password.  Due to Covid, a mask is required upon entering the hospital/clinic. If you do not have a mask, one will be given to you upon arrival. For doctor visits, patients may have 1 support person aged 18 or older with them. For treatment visits, patients cannot have anyone with them due to current Covid guidelines and our immunocompromised population.  

## 2021-05-10 NOTE — Progress Notes (Signed)
[name] presents today for B12 injection per the provider's orders.  Stable during administration without incident; injection site WNL; see MAR for injection details.  Patient tolerated procedure well and without incident.  No questions or complaints noted at this time.  Discharge from clinic via wheelchair in stable condition.  Alert and oriented X 3.  Follow up with [facility] as scheduled.

## 2021-06-09 ENCOUNTER — Other Ambulatory Visit: Payer: Self-pay

## 2021-06-09 ENCOUNTER — Inpatient Hospital Stay (HOSPITAL_COMMUNITY): Payer: Medicare Other | Attending: Hematology

## 2021-06-09 VITALS — BP 149/67 | HR 85 | Temp 98.3°F | Resp 19 | Wt 211.8 lb

## 2021-06-09 DIAGNOSIS — E538 Deficiency of other specified B group vitamins: Secondary | ICD-10-CM

## 2021-06-09 DIAGNOSIS — C342 Malignant neoplasm of middle lobe, bronchus or lung: Secondary | ICD-10-CM | POA: Insufficient documentation

## 2021-06-09 DIAGNOSIS — Z95828 Presence of other vascular implants and grafts: Secondary | ICD-10-CM

## 2021-06-09 MED ORDER — HEPARIN SOD (PORK) LOCK FLUSH 100 UNIT/ML IV SOLN
500.0000 [IU] | Freq: Once | INTRAVENOUS | Status: AC
  Administered 2021-06-09: 13:00:00 500 [IU] via INTRAVENOUS

## 2021-06-09 MED ORDER — CYANOCOBALAMIN 1000 MCG/ML IJ SOLN
1000.0000 ug | Freq: Once | INTRAMUSCULAR | Status: AC
  Administered 2021-06-09: 13:00:00 1000 ug via INTRAMUSCULAR
  Filled 2021-06-09: qty 1

## 2021-06-09 MED ORDER — SODIUM CHLORIDE 0.9% FLUSH
10.0000 mL | INTRAVENOUS | Status: DC | PRN
  Administered 2021-06-09: 13:00:00 10 mL via INTRAVENOUS

## 2021-06-09 NOTE — Progress Notes (Signed)
Patient presents today for port flush and Vitamin B12 injection.  Patient is in satisfactory condition with no new complaints voiced.  Vital signs are stable.  We will proceed with treatment per MD orders.  Patient tolerated Vitamin B12 injection with no complaints voiced.  Site clean and dry with no bruising or swelling noted.  No complaints of pain.  Patients port flushed without difficulty.  Good blood return noted with no bruising or swelling noted at site.  Band aid applied.  VSS with discharge and left in satisfactory condition with no s/s of distress noted.

## 2021-06-09 NOTE — Patient Instructions (Signed)
[facility]  Discharge Instructions: Thank you for choosing [facility] to provide your oncology and hematology care.  If you have a lab appointment with the Cancer Center, please come in thru the Main Entrance and check in at the main information desk.  Wear comfortable clothing and clothing appropriate for easy access to any Portacath or PICC line.   We strive to give you quality time with your provider. You may need to reschedule your appointment if you arrive late (15 or more minutes).  Arriving late affects you and other patients whose appointments are after yours.  Also, if you miss three or more appointments without notifying the office, you may be dismissed from the clinic at the provider's discretion.      For prescription refill requests, have your pharmacy contact our office and allow 72 hours for refills to be completed.        To help prevent nausea and vomiting after your treatment, we encourage you to take your nausea medication as directed.  BELOW ARE SYMPTOMS THAT SHOULD BE REPORTED IMMEDIATELY: *FEVER GREATER THAN 100.4 F (38 C) OR HIGHER *CHILLS OR SWEATING *NAUSEA AND VOMITING THAT IS NOT CONTROLLED WITH YOUR NAUSEA MEDICATION *UNUSUAL SHORTNESS OF BREATH *UNUSUAL BRUISING OR BLEEDING *URINARY PROBLEMS (pain or burning when urinating, or frequent urination) *BOWEL PROBLEMS (unusual diarrhea, constipation, pain near the anus) TENDERNESS IN MOUTH AND THROAT WITH OR WITHOUT PRESENCE OF ULCERS (sore throat, sores in mouth, or a toothache) UNUSUAL RASH, SWELLING OR PAIN  UNUSUAL VAGINAL DISCHARGE OR ITCHING   Items with * indicate a potential emergency and should be followed up as soon as possible or go to the Emergency Department if any problems should occur.  Please show the CHEMOTHERAPY ALERT CARD or IMMUNOTHERAPY ALERT CARD at check-in to the Emergency Department and triage nurse.  Should you have questions after your visit or need to cancel  or reschedule your appointment, please contact [facility] [phone]  and follow the prompts.  Office hours are 8:00 a.m. to 4:30 p.m. Monday - Friday. Please note that voicemails left after 4:00 p.m. may not be returned until the following business day.  We are closed weekends and major holidays. You have access to a nurse at all times for urgent questions. Please call the main number to the clinic [phone] and follow the prompts.  For any non-urgent questions, you may also contact your provider using MyChart. We now offer e-Visits for anyone 18 and older to request care online for non-urgent symptoms. For details visit [email or web address].   Also download the MyChart app! Go to the app store, search "MyChart", open the app, select [facility], and log in with your MyChart username and password.  Due to Covid, a mask is required upon entering the hospital/clinic. If you do not have a mask, one will be given to you upon arrival. For doctor visits, patients may have 1 support person aged 18 or older with them. For treatment visits, patients cannot have anyone with them due to current Covid guidelines and our immunocompromised population.  

## 2021-07-10 ENCOUNTER — Other Ambulatory Visit: Payer: Self-pay

## 2021-07-10 ENCOUNTER — Inpatient Hospital Stay (HOSPITAL_COMMUNITY): Payer: Medicare Other | Attending: Hematology

## 2021-07-10 VITALS — BP 113/63 | HR 106 | Temp 96.9°F | Resp 18

## 2021-07-10 DIAGNOSIS — E538 Deficiency of other specified B group vitamins: Secondary | ICD-10-CM | POA: Insufficient documentation

## 2021-07-10 DIAGNOSIS — C342 Malignant neoplasm of middle lobe, bronchus or lung: Secondary | ICD-10-CM | POA: Diagnosis present

## 2021-07-10 MED ORDER — CYANOCOBALAMIN 1000 MCG/ML IJ SOLN
1000.0000 ug | Freq: Once | INTRAMUSCULAR | Status: AC
  Administered 2021-07-10: 1000 ug via INTRAMUSCULAR
  Filled 2021-07-10: qty 1

## 2021-07-10 NOTE — Patient Instructions (Signed)
[facility]  Discharge Instructions: ?Thank you for choosing [facility] to provide your oncology and hematology care.  ?If you have a lab appointment with the Cancer Center, please come in thru the Main Entrance and check in at the main information desk. ? ?Wear comfortable clothing and clothing appropriate for easy access to any Portacath or PICC line.  ? ?We strive to give you quality time with your provider. You may need to reschedule your appointment if you arrive late (15 or more minutes).  Arriving late affects you and other patients whose appointments are after yours.  Also, if you miss three or more appointments without notifying the office, you may be dismissed from the clinic at the provider?s discretion.    ?  ?For prescription refill requests, have your pharmacy contact our office and allow 72 hours for refills to be completed.   ? ?Today you received the following chemotherapy and/or immunotherapy agents Vitamin B12 ?  ? ?BELOW ARE SYMPTOMS THAT SHOULD BE REPORTED IMMEDIATELY: ?*FEVER GREATER THAN 100.4 F (38 ?C) OR HIGHER ?*CHILLS OR SWEATING ?*NAUSEA AND VOMITING THAT IS NOT CONTROLLED WITH YOUR NAUSEA MEDICATION ?*UNUSUAL SHORTNESS OF BREATH ?*UNUSUAL BRUISING OR BLEEDING ?*URINARY PROBLEMS (pain or burning when urinating, or frequent urination) ?*BOWEL PROBLEMS (unusual diarrhea, constipation, pain near the anus) ?TENDERNESS IN MOUTH AND THROAT WITH OR WITHOUT PRESENCE OF ULCERS (sore throat, sores in mouth, or a toothache) ?UNUSUAL RASH, SWELLING OR PAIN  ?UNUSUAL VAGINAL DISCHARGE OR ITCHING  ? ?Items with * indicate a potential emergency and should be followed up as soon as possible or go to the Emergency Department if any problems should occur. ? ?Please show the CHEMOTHERAPY ALERT CARD or IMMUNOTHERAPY ALERT CARD at check-in to the Emergency Department and triage nurse. ? ?Should you have questions after your visit or need to cancel or reschedule your appointment,  please contact [facility] [phone]  and follow the prompts.  Office hours are 8:00 a.m. to 4:30 p.m. Monday - Friday. Please note that voicemails left after 4:00 p.m. may not be returned until the following business day.  We are closed weekends and major holidays. You have access to a nurse at all times for urgent questions. Please call the main number to the clinic [phone] and follow the prompts. ? ?For any non-urgent questions, you may also contact your provider using MyChart. We now offer e-Visits for anyone 18 and older to request care online for non-urgent symptoms. For details visit [email or web address]. ?  ?Also download the MyChart app! Go to the app store, search "MyChart", open the app, select [facility], and log in with your MyChart username and password. ? ?Due to Covid, a mask is required upon entering the hospital/clinic. If you do not have a mask, one will be given to you upon arrival. For doctor visits, patients may have 1 support person aged 18 or older with them. For treatment visits, patients cannot have anyone with them due to current Covid guidelines and our immunocompromised population.  ?

## 2021-07-10 NOTE — Progress Notes (Signed)
[name] presents today for injection per the provider's orders.  B12 administration without incident; injection site WNL; see MAR for injection details.  Patient tolerated procedure well and without incident.  No questions or complaints noted at this time.  Discharged from clinic via wheelchair in stable condition. Alert and oriented x 3. F/U with [facility] as scheduled.

## 2021-07-18 NOTE — Progress Notes (Signed)
Reviewed at 01/17/21 visit by Dr. Lorenso Courier

## 2021-08-01 ENCOUNTER — Inpatient Hospital Stay (HOSPITAL_COMMUNITY): Payer: Medicare Other | Attending: Hematology

## 2021-08-01 ENCOUNTER — Ambulatory Visit (HOSPITAL_COMMUNITY)
Admission: RE | Admit: 2021-08-01 | Discharge: 2021-08-01 | Disposition: A | Discharge status: Home / Self Care | Payer: Medicare Other | Source: Ambulatory Visit | Attending: Hematology and Oncology | Admitting: Hematology and Oncology

## 2021-08-01 ENCOUNTER — Other Ambulatory Visit: Payer: Self-pay

## 2021-08-01 DIAGNOSIS — Z79899 Other long term (current) drug therapy: Secondary | ICD-10-CM | POA: Diagnosis not present

## 2021-08-01 DIAGNOSIS — C3491 Malignant neoplasm of unspecified part of right bronchus or lung: Secondary | ICD-10-CM | POA: Diagnosis not present

## 2021-08-01 DIAGNOSIS — C7951 Secondary malignant neoplasm of bone: Secondary | ICD-10-CM | POA: Diagnosis not present

## 2021-08-01 DIAGNOSIS — D509 Iron deficiency anemia, unspecified: Secondary | ICD-10-CM | POA: Diagnosis not present

## 2021-08-01 DIAGNOSIS — E538 Deficiency of other specified B group vitamins: Secondary | ICD-10-CM

## 2021-08-01 DIAGNOSIS — N429 Disorder of prostate, unspecified: Secondary | ICD-10-CM

## 2021-08-01 LAB — CBC WITH DIFFERENTIAL/PLATELET
Abs Immature Granulocytes: 0.02 10*3/uL (ref 0.00–0.07)
Basophils Absolute: 0.1 10*3/uL (ref 0.0–0.1)
Basophils Relative: 1 %
Eosinophils Absolute: 0.3 10*3/uL (ref 0.0–0.5)
Eosinophils Relative: 4 %
HCT: 39.1 % (ref 39.0–52.0)
Hemoglobin: 13.3 g/dL (ref 13.0–17.0)
Immature Granulocytes: 0 %
Lymphocytes Relative: 20 %
Lymphs Abs: 1.7 10*3/uL (ref 0.7–4.0)
MCH: 31.1 pg (ref 26.0–34.0)
MCHC: 34 g/dL (ref 30.0–36.0)
MCV: 91.4 fL (ref 80.0–100.0)
Monocytes Absolute: 0.9 10*3/uL (ref 0.1–1.0)
Monocytes Relative: 10 %
Neutro Abs: 5.9 10*3/uL (ref 1.7–7.7)
Neutrophils Relative %: 65 %
Platelets: 277 10*3/uL (ref 150–400)
RBC: 4.28 MIL/uL (ref 4.22–5.81)
RDW: 12.7 % (ref 11.5–15.5)
WBC: 8.9 10*3/uL (ref 4.0–10.5)
nRBC: 0 % (ref 0.0–0.2)

## 2021-08-01 LAB — IRON AND TIBC
Iron: 83 ug/dL (ref 45–182)
Saturation Ratios: 29 % (ref 17.9–39.5)
TIBC: 282 ug/dL (ref 250–450)
UIBC: 199 ug/dL

## 2021-08-01 LAB — COMPREHENSIVE METABOLIC PANEL
ALT: 18 U/L (ref 0–44)
AST: 26 U/L (ref 15–41)
Albumin: 3.4 g/dL — ABNORMAL LOW (ref 3.5–5.0)
Alkaline Phosphatase: 73 U/L (ref 38–126)
Anion gap: 4 — ABNORMAL LOW (ref 5–15)
BUN: 10 mg/dL (ref 8–23)
CO2: 24 mmol/L (ref 22–32)
Calcium: 8.7 mg/dL — ABNORMAL LOW (ref 8.9–10.3)
Chloride: 103 mmol/L (ref 98–111)
Creatinine, Ser: 1.08 mg/dL (ref 0.61–1.24)
GFR, Estimated: >60 mL/min (ref >60)
Glucose, Bld: 153 mg/dL — ABNORMAL HIGH (ref 70–99)
Potassium: 3.9 mmol/L (ref 3.5–5.1)
Sodium: 131 mmol/L — ABNORMAL LOW (ref 135–145)
Total Bilirubin: 0.5 mg/dL (ref 0.3–1.2)
Total Protein: 6.7 g/dL (ref 6.5–8.1)

## 2021-08-01 LAB — FERRITIN: Ferritin: 86 ng/mL (ref 24–336)

## 2021-08-02 LAB — PSA: Prostatic Specific Antigen: 2.04 ng/mL (ref 0.00–4.00)

## 2021-08-08 ENCOUNTER — Inpatient Hospital Stay (HOSPITAL_COMMUNITY): Payer: Medicare Other

## 2021-08-08 ENCOUNTER — Inpatient Hospital Stay (HOSPITAL_BASED_OUTPATIENT_CLINIC_OR_DEPARTMENT_OTHER): Payer: Medicare Other | Admitting: Hematology

## 2021-08-08 ENCOUNTER — Other Ambulatory Visit: Payer: Self-pay

## 2021-08-08 VITALS — BP 132/70 | HR 96 | Temp 96.8°F | Resp 18 | Wt 206.6 lb

## 2021-08-08 DIAGNOSIS — E538 Deficiency of other specified B group vitamins: Secondary | ICD-10-CM

## 2021-08-08 DIAGNOSIS — C3491 Malignant neoplasm of unspecified part of right bronchus or lung: Secondary | ICD-10-CM

## 2021-08-08 MED ORDER — CYANOCOBALAMIN 1000 MCG/ML IJ SOLN
1000.0000 ug | Freq: Once | INTRAMUSCULAR | Status: AC
  Administered 2021-08-08: 1000 ug via INTRAMUSCULAR
  Filled 2021-08-08: qty 1

## 2021-08-08 NOTE — Patient Instructions (Addendum)
[facility] at [facility] Discharge Instructions  You were seen and examined today by Dr. Delton Coombes.   Dr. Delton Coombes reviewed your recent CT scan which revealed changes concerning for cancer recurrence.   Dr. Delton Coombes has recommended a PET scan, a specialized CT scan that illuminates where there is cancer present in the body. Dr. Delton Coombes has also recommended a brain MRI to ensure there is no spread to the brain.  If the PET scan shows any concern for cancer, the next step is a biopsy. The biopsy would be to see if it is the same type of cancer from 2017 or if it has changed.  Follow-up with Dr. Delton Coombes following your scans.   Thank you for choosing [facility] at [facility] to provide your oncology and hematology care.  To afford each patient quality time with our provider, please arrive at least 15 minutes before your scheduled appointment time.   If you have a lab appointment with the [facility] please come in thru the Main Entrance and check in at the main information desk.  You need to re-schedule your appointment should you arrive 10 or more minutes late.  We strive to give you quality time with our providers, and arriving late affects you and other patients whose appointments are after yours.  Also, if you no show three or more times for appointments you may be dismissed from the clinic at the providers discretion.     Again, thank you for choosing [facility]  Our hope is that these requests will decrease the amount of time that you wait before being seen by our physicians.       _____________________________________________________________  Should you have questions after your visit to [facility], please contact our office at [phone] and follow the prompts.  Our office hours are 8:00 a.m. and 4:30 p.m. Monday - Friday.  Please note that voicemails left after 4:00 p.m. may not be returned until  the following business day.  We are closed weekends and major holidays.  You do have access to a nurse 24-7, just call the main number to the clinic [phone] and do not press any options, hold on the line and a nurse will answer the phone.    For prescription refill requests, have your pharmacy contact our office and allow 72 hours.    Due to Covid, you will need to wear a mask upon entering the hospital. If you do not have a mask, a mask will be given to you at the Main Entrance upon arrival. For doctor visits, patients may have 1 support person age 37 or older with them. For treatment visits, patients can not have anyone with them due to social distancing guidelines and our immunocompromised population.

## 2021-08-08 NOTE — Progress Notes (Signed)
[name] presents today for injection per the provider's orders.  B12 administration without incident; injection site WNL; see MAR for injection details.  Patient tolerated procedure well and without incident.  No questions or complaints noted at this time.   Discharged from clinic ambulatory in stable condition. Alert and oriented x 3. F/U with [facility] as scheduled.

## 2021-08-08 NOTE — Patient Instructions (Signed)
[facility]  Discharge Instructions: ?Thank you for choosing [facility] to provide your oncology and hematology care.  ?If you have a lab appointment with the Cancer Center, please come in thru the Main Entrance and check in at the main information desk. ? ?Wear comfortable clothing and clothing appropriate for easy access to any Portacath or PICC line.  ? ?We strive to give you quality time with your provider. You may need to reschedule your appointment if you arrive late (15 or more minutes).  Arriving late affects you and other patients whose appointments are after yours.  Also, if you miss three or more appointments without notifying the office, you may be dismissed from the clinic at the provider?s discretion.    ?  ?For prescription refill requests, have your pharmacy contact our office and allow 72 hours for refills to be completed.   ? ?Today you received B12 injection ?  ? ? ?BELOW ARE SYMPTOMS THAT SHOULD BE REPORTED IMMEDIATELY: ?*FEVER GREATER THAN 100.4 F (38 ?C) OR HIGHER ?*CHILLS OR SWEATING ?*NAUSEA AND VOMITING THAT IS NOT CONTROLLED WITH YOUR NAUSEA MEDICATION ?*UNUSUAL SHORTNESS OF BREATH ?*UNUSUAL BRUISING OR BLEEDING ?*URINARY PROBLEMS (pain or burning when urinating, or frequent urination) ?*BOWEL PROBLEMS (unusual diarrhea, constipation, pain near the anus) ?TENDERNESS IN MOUTH AND THROAT WITH OR WITHOUT PRESENCE OF ULCERS (sore throat, sores in mouth, or a toothache) ?UNUSUAL RASH, SWELLING OR PAIN  ?UNUSUAL VAGINAL DISCHARGE OR ITCHING  ? ?Items with * indicate a potential emergency and should be followed up as soon as possible or go to the Emergency Department if any problems should occur. ? ?Please show the CHEMOTHERAPY ALERT CARD or IMMUNOTHERAPY ALERT CARD at check-in to the Emergency Department and triage nurse. ? ?Should you have questions after your visit or need to cancel or reschedule your appointment, please contact [facility] [phone]   and follow the prompts.  Office hours are 8:00 a.m. to 4:30 p.m. Monday - Friday. Please note that voicemails left after 4:00 p.m. may not be returned until the following business day.  We are closed weekends and major holidays. You have access to a nurse at all times for urgent questions. Please call the main number to the clinic [phone] and follow the prompts. ? ?For any non-urgent questions, you may also contact your provider using MyChart. We now offer e-Visits for anyone 18 and older to request care online for non-urgent symptoms. For details visit [email or web address]. ?  ?Also download the MyChart app! Go to the app store, search "MyChart", open the app, select [facility], and log in with your MyChart username and password. ? ?Due to Covid, a mask is required upon entering the hospital/clinic. If you do not have a mask, one will be given to you upon arrival. For doctor visits, patients may have 1 support person aged 18 or older with them. For treatment visits, patients cannot have anyone with them due to current Covid guidelines and our immunocompromised population.  ?

## 2021-08-08 NOTE — Progress Notes (Signed)
[name], [name]   CLINIC:  Medical Oncology/Hematology  PCP:  [name], [place] [address] [phone]   REASON FOR VISIT:  Follow-up for right lung [name]  PRIOR THERAPY:  1. Right lower lobectomy on 12/30/2015. 2. Carboplatin and pemetrexed x 6 cycles from 02/16/2016 to 05/31/2016. 3. XRT completed on 08/01/2016. 4. Consolidation durvalumab from 09/04/2016 to 08/28/2017.  NGS Results: not done  CURRENT THERAPY: surveillance  BRIEF ONCOLOGIC HISTORY:  Oncology History  Adenocarcinoma of right lung, stage 3 ([place])  11/10/2015 Imaging   13 mm spiculated lesion seen in right middle lobe.   11/30/2015 PET scan   Spiculated right middle lobe nodule is mildly hypermetabolic and most consistent with a stage IA adenocarcinoma.   12/30/2015 Surgery   Right middle lobectomy and node dissection   12/30/2015 Procedure   Right video-assisted thoracoscopy, Thoracoscopic right middle lobectomy, Mediastinal lymph node dissection, and On-Q local anesthetic catheter placement.   01/02/2016 Pathology Results   1. Lung, resection (segmental or lobe), Right Middle Lobe - INVASIVE ADENOCARCINOMA, WELL DIFFERENTIATED, SPANNING 1.2 CM. - THE SURGICAL RESECTION MARGINS ARE NEGATIVE FOR CARCINOMA. 2. Lymph node, biopsy, Level 12 - METASTATIC CARCINOMA IN 1 OF 1 LYMPH NODE (1/1). 3. Lymph node, biopsy, Level 12 #2 - METASTATIC CARCINOMA IN 1 OF 1 LYMPH NODE (1/1). 4. Lymph node, biopsy, Level 7 - METASTATIC CARCINOMA IN 1 OF 1 LYMPH NODE (1/1/). 5. Lymph node, biopsy, Level 7 #2 - METASTATIC CARCINOMA IN 1 OF 1 LYMPH NODE (1/1). 6. Lymph node, biopsy, Level 7 #3 - METASTATIC CARCINOMA IN 1 OF 1 LYMPH NODE (1/1). 7. Lymph node, biopsy, Level 7 #4 - METASTATIC CARCINOMA IN 1 OF 1 LYMPH NODE (1/1). 8. Lymph node, biopsy, 4R - THERE IS NO EVIDENCE OF CARCINOMA IN 1 OF 1 LYMPH NODE (0/1). 9. Lymph node, biopsy, 4R #2 -  THERE IS NO EVIDENCE OF CARCINOMA IN 1 OF 1 LYMPH NODE (0/1).   01/05/2016 Pathology Results   PDL1 NEGATIVE- tumor proportion score of 0%.    01/05/2016 Pathology Results   Genomic alterations identified: BRAF V600E, KIT amplification, PDGFRA amplification, CDKN2A/B loss, TP53 S38f*33.  Additional findings: MSI-STABLE.  No reportable alterations identified: EGFR, KRAS, ALK, MET, RET, ERBB2, ROS1   02/09/2016 Procedure   Port placed by IR.   02/16/2016 - 05/31/2016 Chemotherapy   Carboplatin/Pemetrexed x 6 cycles     - 08/01/2016 Radiation Therapy   [name], [place]   08/28/2016 Imaging   CT CAP- No evidence of recurrent or metastatic carcinoma within the chest, abdomen, or pelvis.  Stable incidental findings include a absent, small benign right adrenal adenoma, sigmoid diverticulosis, and aortic atherosclerosis.   09/04/2016 -  Chemotherapy   Imfinzi immunotherapy up to 52 weeks.    12/07/2016 Imaging   CT C/A/P: New focal streaky opacities within the peripheral posterior right lower lobe and anteromedial left upper lobe- likely atelectasis with infection or other process is less likely. Recommend attention to these areas on future scans.   Small right pleural effusion, slightly increased from 08/28/2016. No evidence of pleural mass or enhancement.   No evidence of malignancy/ metastatic disease within the abdomen or pelvis.     02/12/2017 Imaging   CT chest: IMPRESSION: 1. Stable CT of the chest. No specific findings identified to suggest residual or recurrence of tumor. 2. Persistent right pleural effusion. 3. Paramediastinal radiation change predominantly involving the right lung is similar to  previous study. 4. Stable right adrenal gland nodule and low-density foci within the liver. 5. Aortic Atherosclerosis (ICD10-I70.0) and Emphysema (ICD10-J43.9). Multi vessel coronary artery calcifications noted.     [name] STAGING: [name] Staging  Adenocarcinoma of right lung,  stage 3 (HCC) Staging form: Lung, AJCC 7th Edition - Clinical stage from 12/09/2015: Stage IA (T1a, N0, M0) - Signed by Gary Cancer, PA-C on 12/09/2015 - Pathologic stage from 01/20/2016: Stage IIIA (T1a, N2, cM0) - Signed by Gary Cancer, PA-C on 02/01/2016   INTERVAL HISTORY:  [name], a 88 y.o. male, returns for routine follow-up of his right lung [name]. [name] was last seen on 07/20/2020.   Today he reports feeling well. He reports increased coughing which is disrupting his sleep; the cough is productive of white sputum. He denies hemoptysis. He reports headache in the back of his head and pain his neck. He has lost 5 lbs in 2 months. He walks with the assistance of a cane as he reports he is frequently off balance. He reports occasional numbness in his fingers, and he denies numbness/tingling in his feet.   REVIEW OF SYSTEMS:  Review of Systems  Constitutional:  Positive for unexpected weight change (-5 lbs). Negative for appetite change and fatigue.  HENT:   Positive for trouble swallowing.   Respiratory:  Positive for cough. Negative for hemoptysis.   Gastrointestinal:  Positive for diarrhea and nausea.  Musculoskeletal:  Positive for gait problem (off balance) and neck pain.  Neurological:  Positive for dizziness, gait problem (off balance), headaches and numbness.  Psychiatric/Behavioral:  Positive for sleep disturbance.   All other systems reviewed and are negative.  PAST MEDICAL/SURGICAL HISTORY:  Past Medical History:  Diagnosis Date   Adenocarcinoma of lung, stage 3 (HCC)    Stage IIIA   Adenocarcinoma of right lung, stage 3 ([place]) 12/02/2015   Arthritis    Atrial fibrillation, transient (HCC)    transient postop, < 24 hours   B12 deficiency    Cyst of scrotum    Diverticulitis    GERD (gastroesophageal reflux disease)    Hernia    History of pneumonia    Hyperlipidemia    Lung nodule    Spiculated right middle lobe nodule, hypermetabolic   Stroke  ([name])    Type 2 diabetes mellitus ([name])    Past Surgical History:  Procedure Laterality Date   BACK SURGERY     BIOPSY  12/30/2020   Procedure: BIOPSY;  Surgeon: Gary Quale, MD;  [facility];  Service: Gastroenterology;;   CHOLECYSTECTOMY     COLONOSCOPY N/A 11/24/2015   Procedure: COLONOSCOPY;  Surgeon: Rogene Houston, MD;  [facility];  Service: Endoscopy;  Laterality: N/A;  730   COLONOSCOPY W/ POLYPECTOMY     x 5   COLONOSCOPY WITH PROPOFOL N/A 12/30/2020   Procedure: COLONOSCOPY WITH PROPOFOL;  Surgeon: Gary Quale, MD;  [facility];  Service: Gastroenterology;  Laterality: N/A;  12:15   CYST EXCISION     Scrotum   ESOPHAGEAL DILATION N/A 12/30/2020   Procedure: ESOPHAGEAL DILATION;  Surgeon: Gary Quale, MD;  [facility];  Service: Gastroenterology;  Laterality: N/A;   ESOPHAGOGASTRODUODENOSCOPY (EGD) WITH PROPOFOL N/A 12/30/2020   Procedure: ESOPHAGOGASTRODUODENOSCOPY (EGD) WITH PROPOFOL;  Surgeon: Gary Quale, MD;  [facility];  Service: Gastroenterology;  Laterality: N/A;   EYE SURGERY Bilateral    Cataract with Lens   HERNIA REPAIR Right  Inguinal   IR GENERIC HISTORICAL  02/09/2016   IR US GUIDE VASC ACCESS RIGHT 02/09/2016 Arne Cleveland, MD [facility]   IR GENERIC HISTORICAL  02/09/2016   IR FLUORO GUIDE CV LINE RIGHT 02/09/2016 Arne Cleveland, MD [facility]   LUMBAR DISC SURGERY     per patient report   POLYPECTOMY  12/30/2020   Procedure: POLYPECTOMY;  Surgeon: Gary Quale, MD;  [facility];  Service: Gastroenterology;;   [place] (VATS)/ LOBECTOMY Right 12/30/2015   Procedure: VIDEO ASSISTED THORACOSCOPY (VATS)/RIGHT MIDDLE LOBECTOMY;  Surgeon: Melrose Nakayama, MD;  Location: [facility];  Service: Thoracic;  Laterality: Right;    SOCIAL HISTORY:  Social History   Socioeconomic  History   Marital status: Married    Spouse name: Not on file   Number of children: Not on file   Years of education: Not on file   Highest education level: Not on file  Occupational History   Not on file  Tobacco Use   Smoking status: Former    Types: Cigarettes    Quit date: 05/27/2004    Years since quitting: 17.2   Smokeless tobacco: Never  Vaping Use   Vaping Use: Never used  Substance and Sexual Activity   Alcohol use: No    Alcohol/week: 0.0 standard drinks   Drug use: No   Sexual activity: Not on file    Comment: married  Other Topics Concern   Not on file  Social History Narrative   Not on file   Social Determinants of Health   Financial Resource Strain: Not on file  Food Insecurity: Not on file  Transportation Needs: Not on file  Physical Activity: Not on file  Stress: Not on file  Social Connections: Not on file  Intimate Partner Violence: Not on file    FAMILY HISTORY:  Family History  Problem Relation Age of Onset   Colon [name] Brother     CURRENT MEDICATIONS:  Current Outpatient Medications  Medication Sig Dispense Refill   ACCU-CHEK AVIVA PLUS test strip USE 5 STRIPS DAILY TO MONITOR FLUCTUATING BLOOD SUGAR  4   ACCU-CHEK FASTCLIX LANCETS MISC USE 6 LANCETS DAILY  5   acetaminophen (TYLENOL) 500 MG tablet Take 500 mg by mouth every 6 (six) hours as needed for moderate pain, mild pain or headache.     ALPRAZolam (XANAX) 0.5 MG tablet Take 0.5 mg by mouth at bedtime.     aspirin EC 81 MG tablet Take 81 mg by mouth daily.     benzonatate (TESSALON) 100 MG capsule Take 100 mg by mouth 3 (three) times daily as needed for cough.     calcium carbonate (OS-CAL - DOSED IN MG OF ELEMENTAL CALCIUM) 1250 (500 Ca) MG tablet Take 1 tablet (500 mg of elemental calcium total) by mouth daily with breakfast. 30 tablet 2   Carboxymethylcellulose Sodium 0.25 % SOLN Place 1 drop into both eyes daily as needed (Dry eye).     Cholecalciferol (VITAMIN D) 2000 UNITS CAPS  Take 4,000 Units by mouth 2 (two) times daily.     cyanocobalamin (,VITAMIN B-12,) 1000 MCG/ML injection Inject 1 mL (1,000 mcg total) into the muscle every 30 (thirty) days. 1 mL 12   gabapentin (NEURONTIN) 100 MG capsule Take 100 mg by mouth at bedtime.     glipiZIDE (GLUCOTROL) 5 MG tablet Take 1 tablet (5 mg total) by mouth 2 (two) times daily before a meal. (Patient taking  differently: Take 2.5 mg by mouth 2 (two) times daily before a meal.) 60 tablet 1   levofloxacin (LEVAQUIN) 500 MG tablet Take 500 mg by mouth daily.     lidocaine-prilocaine (EMLA) cream Apply a quarter size amount to port site 1 hour prior to chemo. Do not rub in. Cover with plastic wrap. (Patient taking differently: Apply 1 application topically See admin instructions. Apply a quarter size amount to port site 1 hour prior to chemo. Do not rub in. Cover with plastic wrap.) 30 g 3   lisinopril (ZESTRIL) 5 MG tablet Take 5 mg by mouth daily.     loratadine (CLARITIN) 10 MG tablet Take 10 mg by mouth daily.     Meclizine HCl 25 MG CHEW Chew 25 mg by mouth 3 (three) times daily.     pantoprazole (PROTONIX) 40 MG tablet Take 40 mg by mouth 2 (two) times daily.      simvastatin (ZOCOR) 40 MG tablet Take 40 mg by mouth at bedtime.      terazosin (HYTRIN) 5 MG capsule Take 5 mg by mouth at bedtime.     zolpidem (AMBIEN) 10 MG tablet Take 5 mg by mouth at bedtime as needed for sleep.     No current facility-administered medications for this visit.   Facility-Administered Medications Ordered in Other Visits  Medication Dose Route Frequency Provider Last Rate Last Admin   heparin lock flush 100 unit/mL  500 Units Intravenous Once Derek Jack, MD       sodium chloride flush (NS) 0.9 % injection 10 mL  10 mL Intravenous PRN Derek Jack, MD        ALLERGIES:  Allergies  Allergen Reactions   Morphine And Related Other (See Comments)    confusion   Amitriptyline Other (See Comments)    Confusion   Omeprazole  Other (See Comments)    Other reaction(s): Delirium   Temazepam Other (See Comments)    Other reaction(s): Nightmares   Zantac [Ranitidine] Other (See Comments)    Other reaction(s): Delirium    PHYSICAL EXAM:  Performance status (ECOG): 1 - Symptomatic but completely ambulatory  There were no vitals filed for this visit. Wt Readings from Last 3 Encounters:  06/09/21 211 lb 12.8 oz (96.1 kg)  05/10/21 209 lb 9.6 oz (95.1 kg)  01/17/21 203 lb 11.2 oz (92.4 kg)   Physical Exam Vitals reviewed.  Constitutional:      Appearance: Normal appearance.     Comments: In wheelchair  Cardiovascular:     Rate and Rhythm: Normal rate and regular rhythm.     Pulses: Normal pulses.     Heart sounds: Normal heart sounds.  Pulmonary:     Effort: Pulmonary effort is normal.     Breath sounds: Normal breath sounds.  Musculoskeletal:     Right lower leg: No edema.     Left lower leg: No edema.  Neurological:     General: No focal deficit present.     Mental Status: He is alert and oriented to person, place, and time.  Psychiatric:        Mood and Affect: Mood normal.        Behavior: Behavior normal.     LABORATORY DATA:  I have reviewed the labs as listed.  CBC Latest Ref Rng & Units 08/01/2021 01/10/2021 07/13/2020  WBC 4.0 - 10.5 K/uL 8.9 8.4 6.8  Hemoglobin 13.0 - 17.0 g/dL 13.3 12.5(L) 12.6(L)  Hematocrit 39.0 - 52.0 % 39.1 37.5(L) 37.9(L)  Platelets  150 - 400 K/uL 277 224 239   CMP Latest Ref Rng & Units 08/01/2021 01/10/2021 12/27/2020  Glucose 70 - 99 mg/dL 153(H) 134(H) 105(H)  BUN 8 - 23 mg/dL '10 16 12  ' Creatinine 0.61 - 1.24 mg/dL 1.08 1.01 1.07  Sodium 135 - 145 mmol/L 131(L) 136 132(L)  Potassium 3.5 - 5.1 mmol/L 3.9 3.6 3.9  Chloride 98 - 111 mmol/L 103 105 97(L)  CO2 22 - 32 mmol/L '24 27 28  ' Calcium 8.9 - 10.3 mg/dL 8.7(L) 8.7(L) 8.9  Total Protein 6.5 - 8.1 g/dL 6.7 6.1(L) -  Total Bilirubin 0.3 - 1.2 mg/dL 0.5 0.6 -  Alkaline Phos 38 - 126 U/L 73 69 -  AST 15 - 41 U/L  26 22 -  ALT 0 - 44 U/L 18 16 -    DIAGNOSTIC IMAGING:  I have independently reviewed the scans and discussed with the patient. CT Chest Wo Contrast  Result Date: 08/03/2021 CLINICAL DATA:  Follow-up right lung [name], status post right lower lobectomy, chemotherapy, and radiation * onc * EXAM: CT CHEST WITHOUT CONTRAST TECHNIQUE: Multidetector CT imaging of the chest was performed following the standard protocol without IV contrast. RADIATION DOSE REDUCTION: This exam was performed according to the departmental dose-optimization program which includes automated exposure control, adjustment of the mA and/or kV according to patient size and/or use of iterative reconstruction technique. COMPARISON:  01/10/2021 FINDINGS: Cardiovascular: Aortic atherosclerosis. Right chest port catheter. Normal heart size. Three-vessel coronary artery calcifications. No pericardial effusion. Mediastinum/Nodes: Newly enlarged pretracheal, right hilar, and subcarinal lymph nodes, largest pretracheal nodes measuring up to 2.1 x 1.5 cm (series 2, image 63), largest subcarinal nodes measuring up to 2.4 x 2.0 cm (series 2, image 79). Thyroid gland, trachea, and esophagus demonstrate no significant findings. Lungs/Pleura: Moderate centrilobular emphysema and diffuse bilateral bronchial wall thickening. Significant interval increase in soft tissue thickening and nodularity about the right hilum and infrahilar right lung (series 4, image 83). New, lobulated nodule of the lateral segment right middle lobe measuring 2.2 x 1.5 cm (series 4, image 88). New subpleural nodule of the right middle lobe abutting the diaphragm, measuring 0.7 cm (series 4, image 111). Interlobular septal thickening and subtle pleural thickening about the right lung base. Interval increase in volume of a small to moderate right pleural effusion. Unchanged subpleural ground-glass nodule of the dependent left lower lobe measuring 0.7 cm (series 4, image 101). Upper  Abdomen: No acute abnormality. Small, benign right adrenal adenomata (series 2, image 141). Musculoskeletal: No chest wall abnormality. Unchanged sclerotic lesion of the inferior sternal body (series 2, image 109). IMPRESSION: 1. Significant interval increase in soft tissue thickening and nodularity about the right hilum and infrahilar right lung. New nodules of the right middle lobe. 2. New interlobular septal thickening and subtle pleural thickening about the right lung base. Interval increase in volume of a small to moderate right pleural effusion. 3. Newly enlarged pretracheal, right hilar, and subcarinal lymph nodes. 4. Findings are consistent with recurrent malignancy with parenchymal, nodal, and likely lymphangitic metastatic disease 5. Unchanged treated osseous metastatic lesion of the inferior sternal body. 6. Emphysema and diffuse bilateral bronchial wall thickening. 7. Coronary artery disease. Aortic Atherosclerosis (ICD10-I70.0) and Emphysema (ICD10-J43.9). Electronically Signed   By: Delanna Ahmadi M.D.   On: 08/03/2021 19:42     ASSESSMENT:  1.  Stage III right lung adenocarcinoma: -Diagnosed in June 2017, status post right lower lobectomy, VATS with mediastinal lymph node sampling, PD-L1 negative. -6 cycles of carboplatin  and pemetrexed completed on 05/31/2016, XRT completed on 08/01/2016. -Consolidation durvalumab from 09/04/2016 through 08/28/2017. -CT chest on 12/31/2019 showed stable radiation fibrosis in the right perihilar lung.  No evidence of local tumor recurrence.  Enlarging sclerotic lesion in the lower sternum, cannot exclude sclerotic bone metastasis.  No evidence of metastatic disease in the chest.  Trace dependent right pleural effusion has decreased.  Stable at static 4.1 cm ascending thoracic aorta.  Stable right adrenal adenoma. - Foundation 1 test from July 2017 showed BRAF V600 E, KIT amplification, PDGFRA amplification, CDK N2 A/B loss, T p53.  MSI-stable.  TMB-low. - PD-L1 22  C3 0%.  2.  Social/family history: - He sits mostly in a chair during daytime but gets up every hour to walk around in the house.  He also goes grocery shopping to the local [place] but uses electric chair most of the time.   PLAN:  1.  Stage III right lung adenocarcinoma: - I have reviewed CT chest without contrast from 08/01/2021 which showed significant interval increase in soft tissue thickening and nodularity about the right hilum and infrahilar right lung.  New nodules in the right middle lobe.  New interlobular septal thickening and subtle pleural thickening about the right lung base.  Interval increase in volume of a small to moderate right pleural effusion.  Newly enlarged pretracheal, right hilar and subcarinal lymph nodes. - Findings are highly consistent with recurrent malignancy. - He has lost about 5 pounds since the beginning of the year.  He also reported headache mostly in the right occipital region.  He does not report any hemoptysis although he has cough with white expectoration. - Recommend PET CT scan for further staging and identifying area of biopsy.  Also recommend MRI of the brain with and without contrast. - Based on the PET scan, we will decide the best way to biopsy.  If he needs bronchoscopy for biopsy, we will refer him back to Dr. Roxan Hockey who did his initial surgery.   2.  Iron deficiency anemia: - Ferritin is 86 and hemoglobin is 13.3.  Percent saturation is 29.  No Feraheme needed.   3.  Vitamin B12 deficiency: - He is continuing B12 injections at home.   Orders placed this encounter:  No orders of the defined types were placed in this encounter.    Derek Jack, MD [place] [phone]   I, Thana Ates, am acting as a scribe for Dr. Derek Jack.  I, Derek Jack MD, have reviewed the above documentation for accuracy and completeness, and I agree with the above.

## 2021-08-10 ENCOUNTER — Other Ambulatory Visit: Payer: Self-pay

## 2021-08-10 ENCOUNTER — Encounter (HOSPITAL_COMMUNITY)
Admission: RE | Admit: 2021-08-10 | Discharge: 2021-08-10 | Disposition: A | Discharge status: Home / Self Care | Payer: Medicare Other | Source: Ambulatory Visit | Attending: Hematology | Admitting: Hematology

## 2021-08-10 DIAGNOSIS — J438 Other emphysema: Secondary | ICD-10-CM | POA: Insufficient documentation

## 2021-08-10 DIAGNOSIS — C781 Secondary malignant neoplasm of mediastinum: Secondary | ICD-10-CM | POA: Insufficient documentation

## 2021-08-10 DIAGNOSIS — C3491 Malignant neoplasm of unspecified part of right bronchus or lung: Secondary | ICD-10-CM | POA: Insufficient documentation

## 2021-08-10 DIAGNOSIS — N281 Cyst of kidney, acquired: Secondary | ICD-10-CM | POA: Insufficient documentation

## 2021-08-10 DIAGNOSIS — I700 Atherosclerosis of aorta: Secondary | ICD-10-CM | POA: Diagnosis not present

## 2021-08-10 DIAGNOSIS — C787 Secondary malignant neoplasm of liver and intrahepatic bile duct: Secondary | ICD-10-CM | POA: Diagnosis not present

## 2021-08-10 DIAGNOSIS — K5730 Diverticulosis of large intestine without perforation or abscess without bleeding: Secondary | ICD-10-CM | POA: Insufficient documentation

## 2021-08-10 DIAGNOSIS — J90 Pleural effusion, not elsewhere classified: Secondary | ICD-10-CM | POA: Insufficient documentation

## 2021-08-10 DIAGNOSIS — I2510 Atherosclerotic heart disease of native coronary artery without angina pectoris: Secondary | ICD-10-CM | POA: Insufficient documentation

## 2021-08-10 DIAGNOSIS — J432 Centrilobular emphysema: Secondary | ICD-10-CM | POA: Insufficient documentation

## 2021-08-10 MED ORDER — FLUDEOXYGLUCOSE F - 18 (FDG) INJECTION
10.5900 | Freq: Once | INTRAVENOUS | Status: AC | PRN
  Administered 2021-08-10: 10.59 via INTRAVENOUS

## 2021-08-14 ENCOUNTER — Other Ambulatory Visit (HOSPITAL_COMMUNITY): Payer: Self-pay | Admitting: *Deleted

## 2021-08-14 MED ORDER — LEVOFLOXACIN 500 MG PO TABS: 500.0000 mg | ORAL_TABLET | Freq: Every day | ORAL | 0 refills | Status: AC

## 2021-08-14 NOTE — Progress Notes (Signed)
Wife called to state that he has developed a worsening productive cough with thick green sputum.  Denies any other symptoms and is afebrile at this time.  Per Dr. Delton Coombes, Levaquin 500 mg daily x 7 days was sent to [facility].  Wife notified. ?

## 2021-08-15 NOTE — Progress Notes (Signed)
Has follow up 08/23/21

## 2021-08-18 ENCOUNTER — Other Ambulatory Visit: Payer: Self-pay

## 2021-08-18 ENCOUNTER — Ambulatory Visit (HOSPITAL_COMMUNITY)
Admission: RE | Admit: 2021-08-18 | Discharge: 2021-08-18 | Disposition: A | Discharge status: Home / Self Care | Payer: Medicare Other | Source: Ambulatory Visit | Attending: Hematology | Admitting: Hematology

## 2021-08-18 DIAGNOSIS — C3491 Malignant neoplasm of unspecified part of right bronchus or lung: Secondary | ICD-10-CM | POA: Insufficient documentation

## 2021-08-18 MED ORDER — GADOBUTROL 1 MMOL/ML IV SOLN
9.0000 mL | Freq: Once | INTRAVENOUS | Status: AC | PRN
  Administered 2021-08-18: 9 mL via INTRAVENOUS

## 2021-08-22 ENCOUNTER — Inpatient Hospital Stay (HOSPITAL_COMMUNITY)
Admission: EM | Admit: 2021-08-22 | Discharge: 2021-08-24 | DRG: 312 | Disposition: A | Discharge status: Home / Self Care | Payer: Medicare Other | Attending: Internal Medicine | Admitting: Internal Medicine

## 2021-08-22 ENCOUNTER — Other Ambulatory Visit: Payer: Self-pay

## 2021-08-22 ENCOUNTER — Encounter (HOSPITAL_COMMUNITY): Payer: Self-pay | Admitting: *Deleted

## 2021-08-22 ENCOUNTER — Observation Stay (HOSPITAL_BASED_OUTPATIENT_CLINIC_OR_DEPARTMENT_OTHER): Payer: Medicare Other

## 2021-08-22 ENCOUNTER — Emergency Department (HOSPITAL_COMMUNITY): Payer: Medicare Other

## 2021-08-22 ENCOUNTER — Observation Stay (HOSPITAL_COMMUNITY): Payer: Medicare Other

## 2021-08-22 DIAGNOSIS — R058 Other specified cough: Secondary | ICD-10-CM | POA: Diagnosis present

## 2021-08-22 DIAGNOSIS — Z8673 Personal history of transient ischemic attack (TIA), and cerebral infarction without residual deficits: Secondary | ICD-10-CM

## 2021-08-22 DIAGNOSIS — Z902 Acquired absence of lung [part of]: Secondary | ICD-10-CM

## 2021-08-22 DIAGNOSIS — W19XXXA Unspecified fall, initial encounter: Secondary | ICD-10-CM | POA: Diagnosis present

## 2021-08-22 DIAGNOSIS — Z800 Family history of malignant neoplasm of digestive organs: Secondary | ICD-10-CM

## 2021-08-22 DIAGNOSIS — Z79899 Other long term (current) drug therapy: Secondary | ICD-10-CM

## 2021-08-22 DIAGNOSIS — C3491 Malignant neoplasm of unspecified part of right bronchus or lung: Secondary | ICD-10-CM | POA: Diagnosis present

## 2021-08-22 DIAGNOSIS — Z9841 Cataract extraction status, right eye: Secondary | ICD-10-CM

## 2021-08-22 DIAGNOSIS — R55 Syncope and collapse: Secondary | ICD-10-CM

## 2021-08-22 DIAGNOSIS — Z9221 Personal history of antineoplastic chemotherapy: Secondary | ICD-10-CM

## 2021-08-22 DIAGNOSIS — Z888 Allergy status to other drugs, medicaments and biological substances status: Secondary | ICD-10-CM

## 2021-08-22 DIAGNOSIS — E119 Type 2 diabetes mellitus without complications: Secondary | ICD-10-CM

## 2021-08-22 DIAGNOSIS — Z9049 Acquired absence of other specified parts of digestive tract: Secondary | ICD-10-CM

## 2021-08-22 DIAGNOSIS — I4891 Unspecified atrial fibrillation: Secondary | ICD-10-CM | POA: Diagnosis present

## 2021-08-22 DIAGNOSIS — K219 Gastro-esophageal reflux disease without esophagitis: Secondary | ICD-10-CM | POA: Diagnosis present

## 2021-08-22 DIAGNOSIS — Z961 Presence of intraocular lens: Secondary | ICD-10-CM | POA: Diagnosis present

## 2021-08-22 DIAGNOSIS — E7800 Pure hypercholesterolemia, unspecified: Secondary | ICD-10-CM | POA: Diagnosis present

## 2021-08-22 DIAGNOSIS — Z87891 Personal history of nicotine dependence: Secondary | ICD-10-CM

## 2021-08-22 DIAGNOSIS — Z885 Allergy status to narcotic agent status: Secondary | ICD-10-CM

## 2021-08-22 DIAGNOSIS — Z923 Personal history of irradiation: Secondary | ICD-10-CM

## 2021-08-22 DIAGNOSIS — C787 Secondary malignant neoplasm of liver and intrahepatic bile duct: Secondary | ICD-10-CM | POA: Diagnosis present

## 2021-08-22 DIAGNOSIS — I10 Essential (primary) hypertension: Secondary | ICD-10-CM | POA: Diagnosis present

## 2021-08-22 DIAGNOSIS — C7800 Secondary malignant neoplasm of unspecified lung: Secondary | ICD-10-CM

## 2021-08-22 DIAGNOSIS — Z7984 Long term (current) use of oral hypoglycemic drugs: Secondary | ICD-10-CM

## 2021-08-22 DIAGNOSIS — Z7982 Long term (current) use of aspirin: Secondary | ICD-10-CM

## 2021-08-22 DIAGNOSIS — Z9842 Cataract extraction status, left eye: Secondary | ICD-10-CM

## 2021-08-22 DIAGNOSIS — Z20822 Contact with and (suspected) exposure to covid-19: Secondary | ICD-10-CM | POA: Diagnosis present

## 2021-08-22 DIAGNOSIS — I480 Paroxysmal atrial fibrillation: Secondary | ICD-10-CM | POA: Diagnosis present

## 2021-08-22 DIAGNOSIS — S0990XA Unspecified injury of head, initial encounter: Secondary | ICD-10-CM | POA: Diagnosis present

## 2021-08-22 LAB — COMPREHENSIVE METABOLIC PANEL
ALT: 19 U/L (ref 0–44)
AST: 33 U/L (ref 15–41)
Albumin: 3 g/dL — ABNORMAL LOW (ref 3.5–5.0)
Alkaline Phosphatase: 92 U/L (ref 38–126)
Anion gap: 10 (ref 5–15)
BUN: 12 mg/dL (ref 8–23)
CO2: 23 mmol/L (ref 22–32)
Calcium: 8.3 mg/dL — ABNORMAL LOW (ref 8.9–10.3)
Chloride: 101 mmol/L (ref 98–111)
Creatinine, Ser: 1.21 mg/dL (ref 0.61–1.24)
GFR, Estimated: 57 mL/min — ABNORMAL LOW (ref >60)
Glucose, Bld: 178 mg/dL — ABNORMAL HIGH (ref 70–99)
Potassium: 4 mmol/L (ref 3.5–5.1)
Sodium: 134 mmol/L — ABNORMAL LOW (ref 135–145)
Total Bilirubin: 0.4 mg/dL (ref 0.3–1.2)
Total Protein: 6.2 g/dL — ABNORMAL LOW (ref 6.5–8.1)

## 2021-08-22 LAB — ECHOCARDIOGRAM COMPLETE
AV Mean grad: 4 mmHg
AV Peak grad: 7.6 mmHg
Ao pk vel: 1.38 m/s
Area-P 1/2: 5.84 cm2
Height: 70 in
Weight: 3296 oz

## 2021-08-22 LAB — CBC WITH DIFFERENTIAL/PLATELET
Abs Immature Granulocytes: 0.04 10*3/uL (ref 0.00–0.07)
Basophils Absolute: 0.1 10*3/uL (ref 0.0–0.1)
Basophils Relative: 1 %
Eosinophils Absolute: 0.1 10*3/uL (ref 0.0–0.5)
Eosinophils Relative: 1 %
HCT: 40.1 % (ref 39.0–52.0)
Hemoglobin: 13.2 g/dL (ref 13.0–17.0)
Immature Granulocytes: 0 %
Lymphocytes Relative: 11 %
Lymphs Abs: 1.2 10*3/uL (ref 0.7–4.0)
MCH: 30.2 pg (ref 26.0–34.0)
MCHC: 32.9 g/dL (ref 30.0–36.0)
MCV: 91.8 fL (ref 80.0–100.0)
Monocytes Absolute: 1.1 10*3/uL — ABNORMAL HIGH (ref 0.1–1.0)
Monocytes Relative: 11 %
Neutro Abs: 7.8 10*3/uL — ABNORMAL HIGH (ref 1.7–7.7)
Neutrophils Relative %: 76 %
Platelets: 358 10*3/uL (ref 150–400)
RBC: 4.37 MIL/uL (ref 4.22–5.81)
RDW: 12.7 % (ref 11.5–15.5)
WBC: 10.1 10*3/uL (ref 4.0–10.5)
nRBC: 0 % (ref 0.0–0.2)

## 2021-08-22 LAB — CBG MONITORING, ED: Glucose-Capillary: 166 mg/dL — ABNORMAL HIGH (ref 70–99)

## 2021-08-22 LAB — GLUCOSE, CAPILLARY
Glucose-Capillary: 100 mg/dL — ABNORMAL HIGH (ref 70–99)
Glucose-Capillary: 199 mg/dL — ABNORMAL HIGH (ref 70–99)

## 2021-08-22 LAB — TROPONIN I (HIGH SENSITIVITY)
Troponin I (High Sensitivity): 17 ng/L (ref <18)
Troponin I (High Sensitivity): 16 ng/L (ref <18)

## 2021-08-22 LAB — RESP PANEL BY RT-PCR (FLU A&B, COVID) ARPGX2
Influenza A by PCR: NEGATIVE
Influenza B by PCR: NEGATIVE
SARS Coronavirus 2 by RT PCR: NEGATIVE

## 2021-08-22 LAB — BRAIN NATRIURETIC PEPTIDE: B Natriuretic Peptide: 167 pg/mL — ABNORMAL HIGH (ref 0.0–100.0)

## 2021-08-22 MED ORDER — POLYVINYL ALCOHOL 1.4 % OP SOLN: 1.0000 [drp] | Freq: Every day | OPHTHALMIC | Status: DC | PRN

## 2021-08-22 MED ORDER — ACETAMINOPHEN 500 MG PO TABS: 500.0000 mg | ORAL_TABLET | Freq: Four times a day (QID) | ORAL | Status: DC | PRN

## 2021-08-22 MED ORDER — ACETAMINOPHEN 325 MG PO TABS: 650.0000 mg | ORAL_TABLET | Freq: Four times a day (QID) | ORAL | Status: DC | PRN

## 2021-08-22 MED ORDER — BENZONATATE 100 MG PO CAPS: 100.0000 mg | ORAL_CAPSULE | Freq: Three times a day (TID) | ORAL | Status: DC | PRN

## 2021-08-22 MED ORDER — METOPROLOL TARTRATE 5 MG/5ML IV SOLN: 5.0000 mg | Freq: Four times a day (QID) | INTRAVENOUS | Status: DC | PRN

## 2021-08-22 MED ORDER — PANTOPRAZOLE SODIUM 40 MG PO TBEC
40.0000 mg | DELAYED_RELEASE_TABLET | Freq: Two times a day (BID) | ORAL | Status: DC
  Administered 2021-08-22 – 2021-08-24 (×5): 40 mg via ORAL
  Filled 2021-08-22 (×5): qty 1

## 2021-08-22 MED ORDER — SODIUM CHLORIDE 0.9 % IV BOLUS
500.0000 mL | Freq: Once | INTRAVENOUS | Status: AC
  Administered 2021-08-22: 500 mL via INTRAVENOUS

## 2021-08-22 MED ORDER — SIMVASTATIN 20 MG PO TABS
40.0000 mg | ORAL_TABLET | Freq: Every day | ORAL | Status: DC
  Administered 2021-08-22: 40 mg via ORAL
  Filled 2021-08-22: qty 2

## 2021-08-22 MED ORDER — SODIUM CHLORIDE 0.9 % IV SOLN
250.0000 mL | INTRAVENOUS | Status: DC | PRN
Start: 2021-08-22 — End: 2021-08-24

## 2021-08-22 MED ORDER — INSULIN ASPART 100 UNIT/ML IJ SOLN
0.0000 [IU] | Freq: Three times a day (TID) | INTRAMUSCULAR | Status: DC
  Administered 2021-08-23 (×2): 2 [IU] via SUBCUTANEOUS
  Administered 2021-08-23: 1 [IU] via SUBCUTANEOUS
  Administered 2021-08-24: 2 [IU] via SUBCUTANEOUS
  Administered 2021-08-24: 1 [IU] via SUBCUTANEOUS

## 2021-08-22 MED ORDER — ACETAMINOPHEN 650 MG RE SUPP: 650.0000 mg | Freq: Four times a day (QID) | RECTAL | Status: DC | PRN

## 2021-08-22 MED ORDER — LISINOPRIL 5 MG PO TABS
5.0000 mg | ORAL_TABLET | Freq: Every day | ORAL | Status: DC
  Administered 2021-08-22: 5 mg via ORAL
  Filled 2021-08-22 (×2): qty 1

## 2021-08-22 MED ORDER — CALCIUM CARBONATE 1250 (500 CA) MG PO TABS
1.0000 | ORAL_TABLET | Freq: Every day | ORAL | Status: DC
  Administered 2021-08-23 – 2021-08-24 (×2): 500 mg via ORAL
  Filled 2021-08-22 (×2): qty 1

## 2021-08-22 MED ORDER — ALPRAZOLAM 0.5 MG PO TABS
0.5000 mg | ORAL_TABLET | Freq: Every day | ORAL | Status: DC
  Administered 2021-08-22 – 2021-08-23 (×2): 0.5 mg via ORAL
  Filled 2021-08-22 (×2): qty 1

## 2021-08-22 MED ORDER — ASPIRIN EC 81 MG PO TBEC
81.0000 mg | DELAYED_RELEASE_TABLET | Freq: Every day | ORAL | Status: DC
  Administered 2021-08-22 – 2021-08-24 (×3): 81 mg via ORAL
  Filled 2021-08-22 (×3): qty 1

## 2021-08-22 MED ORDER — GABAPENTIN 100 MG PO CAPS
100.0000 mg | ORAL_CAPSULE | Freq: Every day | ORAL | Status: DC
  Administered 2021-08-22 – 2021-08-23 (×2): 100 mg via ORAL
  Filled 2021-08-22 (×2): qty 1

## 2021-08-22 MED ORDER — LORATADINE 10 MG PO TABS
10.0000 mg | ORAL_TABLET | Freq: Every day | ORAL | Status: DC
  Administered 2021-08-22 – 2021-08-24 (×3): 10 mg via ORAL
  Filled 2021-08-22 (×3): qty 1

## 2021-08-22 MED ORDER — ONDANSETRON HCL 4 MG/2ML IJ SOLN: 4.0000 mg | Freq: Four times a day (QID) | INTRAMUSCULAR | Status: DC | PRN

## 2021-08-22 MED ORDER — SODIUM CHLORIDE 0.9% FLUSH: 3.0000 mL | INTRAVENOUS | Status: DC | PRN

## 2021-08-22 MED ORDER — TERAZOSIN HCL 5 MG PO CAPS
5.0000 mg | ORAL_CAPSULE | Freq: Every day | ORAL | Status: DC
  Administered 2021-08-22 – 2021-08-23 (×2): 5 mg via ORAL
  Filled 2021-08-22 (×2): qty 1

## 2021-08-22 MED ORDER — INSULIN ASPART 100 UNIT/ML IJ SOLN: 0.0000 [IU] | Freq: Every day | INTRAMUSCULAR | Status: DC

## 2021-08-22 MED ORDER — SODIUM CHLORIDE 0.9% FLUSH
3.0000 mL | Freq: Two times a day (BID) | INTRAVENOUS | Status: DC
  Administered 2021-08-22 – 2021-08-24 (×3): 3 mL via INTRAVENOUS

## 2021-08-22 MED ORDER — VITAMIN D 25 MCG (1000 UNIT) PO TABS
4000.0000 [IU] | ORAL_TABLET | Freq: Two times a day (BID) | ORAL | Status: DC
  Administered 2021-08-22 – 2021-08-24 (×4): 4000 [IU] via ORAL
  Filled 2021-08-22 (×6): qty 4

## 2021-08-22 MED ORDER — ENOXAPARIN SODIUM 40 MG/0.4ML IJ SOSY
40.0000 mg | PREFILLED_SYRINGE | INTRAMUSCULAR | Status: DC
  Administered 2021-08-22 – 2021-08-23 (×2): 40 mg via SUBCUTANEOUS
  Filled 2021-08-22 (×2): qty 0.4

## 2021-08-22 MED ORDER — ONDANSETRON HCL 4 MG PO TABS: 4.0000 mg | ORAL_TABLET | Freq: Four times a day (QID) | ORAL | Status: DC | PRN

## 2021-08-22 MED ORDER — CYANOCOBALAMIN 1000 MCG/ML IJ SOLN
1000.0000 ug | INTRAMUSCULAR | Status: DC
  Administered 2021-08-22: 1000 ug via INTRAMUSCULAR
  Filled 2021-08-22: qty 1

## 2021-08-22 NOTE — Assessment & Plan Note (Addendum)
Uncertain why this was not evaluated further ?Noted on EKG, but currently in sinus rhythm ?Patient is not on any rate control medications or anticoagulation ?CHA2DS2-VASc of 4 and therefore, should be on Eliquis, will review with oncology regarding any contraindications ?Metoprolol as needed for heart rate control for now ?May be a potential cause of his symptoms ?Continue to monitor on telemetry ?Anticipate potential need for Holter monitoring outpatient ?

## 2021-08-22 NOTE — H&P (Signed)
?History and Physical  ? ? ?Patient: [name] SFK:[id number] DOB: 1930-09-19 ?DOA: 08/22/2021 ?DOS: the patient was seen and examined on 08/22/2021 ?PCP: Monico Blitz, MD  ?Patient coming from: Home ? ?Chief Complaint:  ?Chief Complaint  ?Patient presents with  ? Loss of Consciousness  ? ?HPI: [name] is a 88 y.o. male with medical history significant of lung adenocarcinoma with prior lower lobe lobectomy, type 2 diabetes, hypertension, dyslipidemia, transient A-fib, and GERD who presented to the ED with loss of consciousness that occurred this morning while he was sitting at his breakfast table coughing.  He was apparently started on Levaquin a little less than a week ago by his oncologist due to concern for bronchitis/pneumonia.  Patient's spouse is at bedside who is the primary historian and states that he was coughing forcefully when his face turned red and he passed out.  He struck his forehead and nose on a table nearby.  Shortly after passing out within a few seconds he regained consciousness.  Spouse states that he is generally weak at home and requires a walker for ambulation.  He apparently has an appointment with his oncologist tomorrow to review PET scan results as well as brain MRI results.  He currently denies any pain or symptomatology. ? ?In the ED, CT scanning of the head negative for any acute findings and chest x-ray with no acute findings.  He has been started on a small bolus of IV fluid. ? ?Review of Systems: As mentioned in the history of present illness. All other systems reviewed and are negative. ?Past Medical History:  ?Diagnosis Date  ? Adenocarcinoma of lung, stage 3 ([place])   ? Stage IIIA  ? Adenocarcinoma of right lung, stage 3 ([place]) 12/02/2015  ? Arthritis   ? Atrial fibrillation, transient ([place])   ? transient postop, < 24 hours  ? B12 deficiency   ? Cyst of scrotum   ? Diverticulitis   ? GERD (gastroesophageal reflux disease)   ? Hernia   ? History of pneumonia   ?  Hyperlipidemia   ? Lung nodule   ? Spiculated right middle lobe nodule, hypermetabolic  ? Stroke [facility])   ? Type 2 diabetes mellitus ([place])   ? ?Past Surgical History:  ?Procedure Laterality Date  ? BACK SURGERY    ? BIOPSY  12/30/2020  ? Procedure: BIOPSY;  Surgeon: Harvel Quale, MD;  [facility];  Service: Gastroenterology;;  ? CHOLECYSTECTOMY    ? COLONOSCOPY N/A 11/24/2015  ? Procedure: COLONOSCOPY;  Surgeon: Rogene Houston, MD;  [facility];  Service: Endoscopy;  Laterality: N/A;  730  ? COLONOSCOPY W/ POLYPECTOMY    ? x 5  ? COLONOSCOPY WITH PROPOFOL N/A 12/30/2020  ? Procedure: COLONOSCOPY WITH PROPOFOL;  Surgeon: Harvel Quale, MD;  [facility];  Service: Gastroenterology;  Laterality: N/A;  12:15  ? CYST EXCISION    ? Scrotum  ? ESOPHAGEAL DILATION N/A 12/30/2020  ? Procedure: ESOPHAGEAL DILATION;  Surgeon: Montez Morita, Quillian Quince, MD;  [facility];  Service: Gastroenterology;  Laterality: N/A;  ? ESOPHAGOGASTRODUODENOSCOPY (EGD) WITH PROPOFOL N/A 12/30/2020  ? Procedure: ESOPHAGOGASTRODUODENOSCOPY (EGD) WITH PROPOFOL;  Surgeon: Harvel Quale, MD;  [facility];  Service: Gastroenterology;  Laterality: N/A;  ? EYE SURGERY Bilateral   ? Cataract with Lens  ? HERNIA REPAIR Right   ? Inguinal  ? IR GENERIC HISTORICAL  02/09/2016  ? IR US GUIDE VASC ACCESS RIGHT 02/09/2016  Arne Cleveland, MD [facility]  ? IR GENERIC HISTORICAL  02/09/2016  ? IR FLUORO GUIDE CV LINE RIGHT 02/09/2016 Arne Cleveland, MD [facility]  ? LUMBAR DISC SURGERY    ? per patient report  ? POLYPECTOMY  12/30/2020  ? Procedure: POLYPECTOMY;  Surgeon: Harvel Quale, MD;  [facility];  Service: Gastroenterology;;  ? PORTACATH PLACEMENT    ? VIDEO ASSISTED THORACOSCOPY (VATS)/ LOBECTOMY Right 12/30/2015  ? Procedure: VIDEO ASSISTED THORACOSCOPY (VATS)/RIGHT MIDDLE LOBECTOMY;  Surgeon: Melrose Nakayama, MD;  Location: [place];   Service: Thoracic;  Laterality: Right;  ? ?Social History:  reports that he quit smoking about 17 years ago. His smoking use included cigarettes. He has never used smokeless tobacco. He reports that he does not drink alcohol and does not use drugs. ? ?Allergies  ?Allergen Reactions  ? Morphine And Related Other (See Comments)  ?  confusion  ? Amitriptyline Other (See Comments)  ?  Confusion  ? Omeprazole Other (See Comments)  ?  Other reaction(s): Delirium  ? Temazepam Other (See Comments)  ?  Other reaction(s): Nightmares  ? Zantac [Ranitidine] Other (See Comments)  ?  Other reaction(s): Delirium  ? ? ?Family History  ?Problem Relation Age of Onset  ? Colon cancer Brother   ? ? ?Prior to Admission medications   ?Medication Sig Start Date End Date Taking? Authorizing Provider  ?acetaminophen (TYLENOL) 500 MG tablet Take 500 mg by mouth every 6 (six) hours as needed for moderate pain, mild pain or headache.   Yes [provider]  ?ALPRAZolam Duanne Moron) 0.5 MG tablet Take 0.5 mg by mouth at bedtime. 10/12/20  Yes [provider]  ?aspirin EC 81 MG tablet Take 81 mg by mouth daily.   Yes [provider]  ?benzonatate (TESSALON) 100 MG capsule Take 100 mg by mouth 3 (three) times daily as needed for cough.   Yes [provider]  ?calcium carbonate (OS-CAL - DOSED IN MG OF ELEMENTAL CALCIUM) 1250 (500 Ca) MG tablet Take 1 tablet (500 mg of elemental calcium total) by mouth daily with breakfast. 04/19/16  Yes Kefalas, Manon Hilding, PA-C  ?Carboxymethylcellulose Sodium 0.25 % SOLN Place 1 drop into both eyes daily as needed (Dry eye).   Yes [provider]  ?Cholecalciferol (VITAMIN D) 2000 UNITS CAPS Take 4,000 Units by mouth 2 (two) times daily.   Yes [provider]  ?cyanocobalamin (,VITAMIN B-12,) 1000 MCG/ML injection Inject 1 mL (1,000 mcg total) into the muscle every 30 (thirty) days. 04/12/20  Yes Derek Jack, MD  ?gabapentin (NEURONTIN) 100 MG capsule Take 100  mg by mouth at bedtime.   Yes [provider]  ?glipiZIDE (GLUCOTROL) 5 MG tablet Take 1 tablet (5 mg total) by mouth 2 (two) times daily before a meal. ?Patient taking differently: Take 2.5 mg by mouth 2 (two) times daily before a meal. 01/05/16  Yes Gold, Wayne E, PA-C  ?lisinopril (ZESTRIL) 5 MG tablet Take 5 mg by mouth daily.   Yes [provider]  ?loratadine (CLARITIN) 10 MG tablet Take 10 mg by mouth daily.   Yes [provider]  ?Meclizine HCl 25 MG CHEW Chew 25 mg by mouth 3 (three) times daily. 09/16/20  Yes [provider]  ?pantoprazole (PROTONIX) 40 MG tablet Take 40 mg by mouth 2 (two) times daily.    Yes [provider]  ?simvastatin (ZOCOR) 40 MG tablet Take 40 mg by mouth at bedtime.    Yes [provider]  ?terazosin (HYTRIN) 5 MG capsule Take 5 mg by mouth at bedtime.   Yes [provider]  ?ACCU-CHEK AVIVA PLUS test strip USE 5 STRIPS DAILY TO MONITOR FLUCTUATING BLOOD SUGAR 11/13/17   [provider]  ?ACCU-CHEK FASTCLIX LANCETS MISC USE 6 LANCETS DAILY 11/13/17   [provider]  ?lidocaine-prilocaine (EMLA) cream Apply a quarter size amount to port site 1 hour prior to chemo. Do not rub in. Cover with plastic wrap. ?Patient taking differently: Apply 1 application. topically See admin instructions. Apply a quarter size amount to port site 1 hour prior to chemo. Do not rub in. Cover with plastic wrap. 02/01/16   Penland, Kelby Fam, MD  ? ? ?Physical Exam: ?Vitals:  ? 08/22/21 1130 08/22/21 1230 08/22/21 1300 08/22/21 1330  ?BP: (!) 107/49 (!) 93/47 (!) 96/45 (!) 102/45  ?Pulse: 94 91 89 87  ?Resp: 17 12 (!) 24 (!) 27  ?Temp:      ?TempSrc:      ?SpO2: 93% 94% 94% 96%  ?Weight:      ?Height:      ? ?General exam: Appears calm and comfortable  ?Respiratory system: Clear to auscultation. Respiratory effort normal.  ?Cardiovascular system: S1 & S2 heard, RRR.  ?Gastrointestinal system: Abdomen is soft, no guarding, rigidity,  or rebound.   ?Central nervous system: Alert and awake ?Extremities: No edema ?Skin: No significant lesions noted, forehead with minor lacerations and ecchymosis noted ?Psychiatry: Flat affect. ? ?Data Reviewed: ? ?

## 2021-08-22 NOTE — ED Triage Notes (Signed)
Pt brought in by [facility] from home with c/o syncopal episode. Pt was sitting at his kitchen table at fell forwards and struck his forehead on the kitchen table. CBG 173, BP 145/64, HR 110, 90% on RA for EMS.  ?

## 2021-08-22 NOTE — Assessment & Plan Note (Signed)
-   Continue home statin °

## 2021-08-22 NOTE — Progress Notes (Signed)
*  PRELIMINARY RESULTS* ?Echocardiogram ?2D Echocardiogram has been performed. ? ?[name] ?08/22/2021, 4:10 PM ?

## 2021-08-22 NOTE — Assessment & Plan Note (Signed)
Appreciate oncology consultation for further evaluation and management ? ?

## 2021-08-22 NOTE — Assessment & Plan Note (Signed)
Potentially vasovagal in nature with fits of coughing ?Spouse indicates that he has been eating and drinking well and therefore dehydration less likely ?Continue telemetry monitoring ?2D echocardiogram and carotid ultrasound for further evaluation ?PE not suspected ?PT evaluation for worsening weakness and need for walker at home ?

## 2021-08-22 NOTE — Assessment & Plan Note (Signed)
Hold home hypoglycemic agents ?SSI ?

## 2021-08-22 NOTE — ED Provider Notes (Signed)
?[facility] ?Provider Note ? ? ?CSN: [id number] ?Arrival date & time: 08/22/21  0932 ? ?  ? ?History ? ?Chief Complaint  ?Patient presents with  ? Loss of Consciousness  ? ? ?[name] is a 88 y.o. male. ? ? ?Loss of Consciousness ?Associated symptoms: weakness (generalized weakness)   ?Associated symptoms: no chest pain, no dizziness, no fever, no headaches, no nausea, no seizures, no shortness of breath and no vomiting   ? ?  ? ?[name] is a 88 y.o. male with past medical history of type 2 diabetes, transient atrial fibrillation, and stage III adenocarcinoma with prior lower lobe lobectomy, radiation and chemotherapy who presents to the Emergency Department complaining of loss of consciousness that occurred this morning while coughing.  Patient's spouse provides most of history.  She states that he was sitting in the kitchen at the table when he began coughing forcefully, states his face turned very red and he passed out falling out of the chair and struck the right side of his forehead and his nose on either the table or refrigerator as he fell.  She states the episode was very brief and he recalls the events within seconds afterwards.  She states that he has had a forceful cough for some time, his oncologist has been treating him with Levaquin.  He is completed a 7-day course.  He has an appointment tomorrow with his oncologist for further evaluation of possible recurrent malignancy.  He had PET scan on 08/11/2021 and MRI brain on 08/18/2021.  On arrival, patient denies any pain.  States that he feels weak and is unable to sleep due to persistent coughing.  He uses a walker at baseline he does not have a supplemental oxygen requirement. ? ?Home Medications ?Prior to Admission medications   ?Medication Sig Start Date End Date Taking? Authorizing Provider  ?ACCU-CHEK AVIVA PLUS test strip USE 5 STRIPS DAILY TO MONITOR FLUCTUATING BLOOD SUGAR 11/13/17   [provider]   ?ACCU-CHEK FASTCLIX LANCETS MISC USE 6 LANCETS DAILY 11/13/17   [provider]  ?acetaminophen (TYLENOL) 500 MG tablet Take 500 mg by mouth every 6 (six) hours as needed for moderate pain, mild pain or headache.    [provider]  ?ALPRAZolam Duanne Moron) 0.5 MG tablet Take 0.5 mg by mouth at bedtime. 10/12/20   [provider]  ?aspirin EC 81 MG tablet Take 81 mg by mouth daily.    [provider]  ?benzonatate (TESSALON) 100 MG capsule Take 100 mg by mouth 3 (three) times daily as needed for cough.    [provider]  ?calcium carbonate (OS-CAL - DOSED IN MG OF ELEMENTAL CALCIUM) 1250 (500 Ca) MG tablet Take 1 tablet (500 mg of elemental calcium total) by mouth daily with breakfast. 04/19/16   Baird Cancer, PA-C  ?Carboxymethylcellulose Sodium 0.25 % SOLN Place 1 drop into both eyes daily as needed (Dry eye).    [provider]  ?Cholecalciferol (VITAMIN D) 2000 UNITS CAPS Take 4,000 Units by mouth 2 (two) times daily.    [provider]  ?cyanocobalamin (,VITAMIN B-12,) 1000 MCG/ML injection Inject 1 mL (1,000 mcg total) into the muscle every 30 (thirty) days. 04/12/20   Derek Jack, MD  ?gabapentin (NEURONTIN) 100 MG capsule Take 100 mg by mouth at bedtime.    [provider]  ?glipiZIDE (GLUCOTROL) 5 MG tablet Take 1 tablet (5 mg total) by mouth 2 (two) times daily before a meal. ?Patient taking differently:  Take 2.5 mg by mouth 2 (two) times daily before a meal. 01/05/16   Gold, Wilder Glade, PA-C  ?lidocaine-prilocaine (EMLA) cream Apply a quarter size amount to port site 1 hour prior to chemo. Do not rub in. Cover with plastic wrap. ?Patient taking differently: Apply 1 application topically See admin instructions. Apply a quarter size amount to port site 1 hour prior to chemo. Do not rub in. Cover with plastic wrap. 02/01/16   Penland, Kelby Fam, MD  ?lisinopril (ZESTRIL) 5 MG tablet Take 5 mg by mouth daily.    [provider]   ?loratadine (CLARITIN) 10 MG tablet Take 10 mg by mouth daily.    [provider]  ?Meclizine HCl 25 MG CHEW Chew 25 mg by mouth 3 (three) times daily. 09/16/20   [provider]  ?pantoprazole (PROTONIX) 40 MG tablet Take 40 mg by mouth 2 (two) times daily.     [provider]  ?simvastatin (ZOCOR) 40 MG tablet Take 40 mg by mouth at bedtime.     [provider]  ?terazosin (HYTRIN) 5 MG capsule Take 5 mg by mouth at bedtime.    [provider]  ?zolpidem (AMBIEN) 10 MG tablet Take 5 mg by mouth at bedtime as needed for sleep. 03/14/20   [provider]  ?   ? ?Allergies    ?Morphine and related, Amitriptyline, Omeprazole, Temazepam, and Zantac [ranitidine]   ? ?Review of Systems   ?Review of Systems  ?Constitutional:  Negative for appetite change, chills and fever.  ?HENT:  Negative for facial swelling and trouble swallowing.   ?     Right forehead abrasion, abrasion right eyebrow and skin tear to nose.    ?Eyes:  Negative for visual disturbance.  ?Respiratory:  Positive for cough. Negative for shortness of breath.   ?Cardiovascular:  Positive for syncope. Negative for chest pain.  ?Gastrointestinal:  Negative for abdominal pain, diarrhea, nausea and vomiting.  ?Genitourinary:  Positive for frequency.  ?     Burning with urination  ?Musculoskeletal:  Negative for arthralgias, back pain and neck pain.  ?Neurological:  Positive for syncope and weakness (generalized weakness). Negative for dizziness, seizures, speech difficulty and headaches.  ? ?Physical Exam ?Updated Vital Signs ?BP (!) 111/53   Pulse 96   Temp (!) 97.5 ?F (36.4 ?C) (Oral)   Resp 17   Ht 5\' 10"  (1.778 m)   Wt 93.4 kg   SpO2 93%   BMI 29.56 kg/m?  ?Physical Exam ?Vitals and nursing note reviewed.  ?Constitutional:   ?   Appearance: Normal appearance. He is not ill-appearing.  ?HENT:  ?   Head:  ?   Comments: Abrasion right forehead, eyebrow, 0.5 cm skin tear to bridge of nose.  No active  bleeding.  No septal or forehead hematomas. ?   Nose:  ?   Comments: Dried blood to both naris.  No epistaxis ?   Mouth/Throat:  ?   Mouth: Mucous membranes are moist.  ?   Pharynx: Oropharynx is clear.  ?   Comments: No blood to the posterior oropharynx. ?Eyes:  ?   Extraocular Movements: Extraocular movements intact.  ?   Conjunctiva/sclera: Conjunctivae normal.  ?   Pupils: Pupils are equal, round, and reactive to light.  ?Cardiovascular:  ?   Rate and Rhythm: Normal rate and regular rhythm.  ?   Pulses: Normal pulses.  ?Pulmonary:  ?   Effort: Pulmonary effort is normal. No respiratory distress.  ?  Breath sounds: No wheezing.  ?   Comments: Lung sounds diminished bilaterally, no rales or wheezing. ?Chest:  ?   Chest wall: No tenderness.  ?Abdominal:  ?   General: There is distension.  ?   Palpations: Abdomen is soft.  ?   Tenderness: There is no abdominal tenderness.  ?Musculoskeletal:     ?   General: No swelling or tenderness.  ?   Cervical back: Normal range of motion. No tenderness.  ?   Right lower leg: No edema.  ?   Left lower leg: No edema.  ?Skin: ?   General: Skin is warm.  ?   Capillary Refill: Capillary refill takes less than 2 seconds.  ?Neurological:  ?   General: No focal deficit present.  ?   Mental Status: He is alert and oriented to person, place, and time.  ?   GCS: GCS eye subscore is 4. GCS verbal subscore is 5. GCS motor subscore is 6.  ?   Sensory: Sensation is intact. No sensory deficit.  ?   Motor: Motor function is intact. No weakness.  ?   Coordination: Coordination is intact.  ? ? ?ED Results / Procedures / Treatments   ?Labs ?(all labs ordered are listed, but only abnormal results are displayed) ?Labs Reviewed  ?CBC WITH DIFFERENTIAL/PLATELET - Abnormal; Notable for the following components:  ?    Result Value  ? Neutro Abs 7.8 (*)   ? Monocytes Absolute 1.1 (*)   ? All other components within normal limits  ?COMPREHENSIVE METABOLIC PANEL - Abnormal; Notable for the following  components:  ? Sodium 134 (*)   ? Glucose, Bld 178 (*)   ? Calcium 8.3 (*)   ? Total Protein 6.2 (*)   ? Albumin 3.0 (*)   ? GFR, Estimated 57 (*)   ? All other components within normal limits  ?CBG MONITORING, ED - Abnormal;

## 2021-08-23 ENCOUNTER — Ambulatory Visit (HOSPITAL_COMMUNITY): Payer: Medicare Other | Admitting: Hematology

## 2021-08-23 DIAGNOSIS — I10 Essential (primary) hypertension: Secondary | ICD-10-CM | POA: Diagnosis present

## 2021-08-23 DIAGNOSIS — R058 Other specified cough: Secondary | ICD-10-CM | POA: Diagnosis present

## 2021-08-23 DIAGNOSIS — K219 Gastro-esophageal reflux disease without esophagitis: Secondary | ICD-10-CM | POA: Diagnosis present

## 2021-08-23 DIAGNOSIS — Z7984 Long term (current) use of oral hypoglycemic drugs: Secondary | ICD-10-CM | POA: Diagnosis not present

## 2021-08-23 DIAGNOSIS — Z9049 Acquired absence of other specified parts of digestive tract: Secondary | ICD-10-CM | POA: Diagnosis not present

## 2021-08-23 DIAGNOSIS — Z79899 Other long term (current) drug therapy: Secondary | ICD-10-CM | POA: Diagnosis not present

## 2021-08-23 DIAGNOSIS — I480 Paroxysmal atrial fibrillation: Secondary | ICD-10-CM

## 2021-08-23 DIAGNOSIS — Z885 Allergy status to narcotic agent status: Secondary | ICD-10-CM | POA: Diagnosis not present

## 2021-08-23 DIAGNOSIS — E119 Type 2 diabetes mellitus without complications: Secondary | ICD-10-CM | POA: Diagnosis present

## 2021-08-23 DIAGNOSIS — Z923 Personal history of irradiation: Secondary | ICD-10-CM | POA: Diagnosis not present

## 2021-08-23 DIAGNOSIS — Z888 Allergy status to other drugs, medicaments and biological substances status: Secondary | ICD-10-CM | POA: Diagnosis not present

## 2021-08-23 DIAGNOSIS — Z87891 Personal history of nicotine dependence: Secondary | ICD-10-CM | POA: Diagnosis not present

## 2021-08-23 DIAGNOSIS — Z7982 Long term (current) use of aspirin: Secondary | ICD-10-CM | POA: Diagnosis not present

## 2021-08-23 DIAGNOSIS — Z800 Family history of malignant neoplasm of digestive organs: Secondary | ICD-10-CM | POA: Diagnosis not present

## 2021-08-23 DIAGNOSIS — Z902 Acquired absence of lung [part of]: Secondary | ICD-10-CM | POA: Diagnosis not present

## 2021-08-23 DIAGNOSIS — C3491 Malignant neoplasm of unspecified part of right bronchus or lung: Secondary | ICD-10-CM | POA: Diagnosis present

## 2021-08-23 DIAGNOSIS — W19XXXA Unspecified fall, initial encounter: Secondary | ICD-10-CM | POA: Diagnosis present

## 2021-08-23 DIAGNOSIS — C787 Secondary malignant neoplasm of liver and intrahepatic bile duct: Secondary | ICD-10-CM | POA: Diagnosis present

## 2021-08-23 DIAGNOSIS — I4891 Unspecified atrial fibrillation: Secondary | ICD-10-CM | POA: Diagnosis not present

## 2021-08-23 DIAGNOSIS — Z20822 Contact with and (suspected) exposure to covid-19: Secondary | ICD-10-CM | POA: Diagnosis present

## 2021-08-23 DIAGNOSIS — Z8673 Personal history of transient ischemic attack (TIA), and cerebral infarction without residual deficits: Secondary | ICD-10-CM | POA: Diagnosis not present

## 2021-08-23 DIAGNOSIS — Z9221 Personal history of antineoplastic chemotherapy: Secondary | ICD-10-CM | POA: Diagnosis not present

## 2021-08-23 DIAGNOSIS — S0990XA Unspecified injury of head, initial encounter: Secondary | ICD-10-CM | POA: Diagnosis present

## 2021-08-23 DIAGNOSIS — R55 Syncope and collapse: Secondary | ICD-10-CM | POA: Diagnosis present

## 2021-08-23 DIAGNOSIS — E7800 Pure hypercholesterolemia, unspecified: Secondary | ICD-10-CM | POA: Diagnosis present

## 2021-08-23 LAB — CBC
HCT: 36.8 % — ABNORMAL LOW (ref 39.0–52.0)
Hemoglobin: 12.2 g/dL — ABNORMAL LOW (ref 13.0–17.0)
MCH: 30.3 pg (ref 26.0–34.0)
MCHC: 33.2 g/dL (ref 30.0–36.0)
MCV: 91.5 fL (ref 80.0–100.0)
Platelets: 324 10*3/uL (ref 150–400)
RBC: 4.02 MIL/uL — ABNORMAL LOW (ref 4.22–5.81)
RDW: 12.8 % (ref 11.5–15.5)
WBC: 8.9 10*3/uL (ref 4.0–10.5)
nRBC: 0 % (ref 0.0–0.2)

## 2021-08-23 LAB — BASIC METABOLIC PANEL
Anion gap: 10 (ref 5–15)
BUN: 12 mg/dL (ref 8–23)
CO2: 24 mmol/L (ref 22–32)
Calcium: 8.3 mg/dL — ABNORMAL LOW (ref 8.9–10.3)
Chloride: 101 mmol/L (ref 98–111)
Creatinine, Ser: 1.1 mg/dL (ref 0.61–1.24)
GFR, Estimated: >60 mL/min (ref >60)
Glucose, Bld: 124 mg/dL — ABNORMAL HIGH (ref 70–99)
Potassium: 3.8 mmol/L (ref 3.5–5.1)
Sodium: 135 mmol/L (ref 135–145)

## 2021-08-23 LAB — GLUCOSE, CAPILLARY
Glucose-Capillary: 127 mg/dL — ABNORMAL HIGH (ref 70–99)
Glucose-Capillary: 183 mg/dL — ABNORMAL HIGH (ref 70–99)
Glucose-Capillary: 178 mg/dL — ABNORMAL HIGH (ref 70–99)
Glucose-Capillary: 176 mg/dL — ABNORMAL HIGH (ref 70–99)

## 2021-08-23 LAB — MAGNESIUM: Magnesium: 1.5 mg/dL — ABNORMAL LOW (ref 1.7–2.4)

## 2021-08-23 LAB — TSH: TSH: 4.489 u[IU]/mL (ref 0.350–4.500)

## 2021-08-23 LAB — HEMOGLOBIN A1C
Hgb A1c MFr Bld: 7.4 % — ABNORMAL HIGH (ref 4.8–5.6)
Mean Plasma Glucose: 166 mg/dL

## 2021-08-23 MED ORDER — DILTIAZEM HCL 25 MG/5ML IV SOLN
10.0000 mg | Freq: Once | INTRAVENOUS | Status: AC
  Administered 2021-08-23: 10 mg via INTRAVENOUS
  Filled 2021-08-23: qty 5

## 2021-08-23 MED ORDER — DILTIAZEM HCL 30 MG PO TABS: 30.0000 mg | ORAL_TABLET | Freq: Four times a day (QID) | ORAL | Status: DC

## 2021-08-23 MED ORDER — CHLORHEXIDINE GLUCONATE CLOTH 2 % EX PADS
6.0000 | MEDICATED_PAD | Freq: Every day | CUTANEOUS | Status: DC
  Administered 2021-08-23 – 2021-08-24 (×2): 6 via TOPICAL

## 2021-08-23 MED ORDER — DILTIAZEM HCL 30 MG PO TABS
30.0000 mg | ORAL_TABLET | Freq: Four times a day (QID) | ORAL | Status: DC
  Administered 2021-08-23 – 2021-08-24 (×4): 30 mg via ORAL
  Filled 2021-08-23 (×4): qty 1

## 2021-08-23 MED ORDER — GUAIFENESIN-DM 100-10 MG/5ML PO SYRP: 5.0000 mL | ORAL_SOLUTION | ORAL | Status: DC | PRN

## 2021-08-23 MED ORDER — BENZONATATE 100 MG PO CAPS
100.0000 mg | ORAL_CAPSULE | Freq: Three times a day (TID) | ORAL | Status: DC
  Administered 2021-08-23 – 2021-08-24 (×4): 100 mg via ORAL
  Filled 2021-08-23 (×4): qty 1

## 2021-08-23 MED ORDER — ATORVASTATIN CALCIUM 20 MG PO TABS
20.0000 mg | ORAL_TABLET | Freq: Every day | ORAL | Status: DC
  Administered 2021-08-23 – 2021-08-24 (×2): 20 mg via ORAL
  Filled 2021-08-23 (×2): qty 1

## 2021-08-23 MED ORDER — MAGNESIUM SULFATE 2 GM/50ML IV SOLN
2.0000 g | Freq: Once | INTRAVENOUS | Status: AC
  Administered 2021-08-23: 2 g via INTRAVENOUS
  Filled 2021-08-23: qty 50

## 2021-08-23 NOTE — Progress Notes (Signed)
Stabalized after cardizem and HR went to 91 ? Dr. Manuella Ghazi made aware  ? 08/23/21 0845 08/23/21 0857  ?Assess: MEWS Score  ?BP (!) 116/58 (!) 115/53  ?Pulse Rate (!) 135 91  ?Resp 20 20  ?Level of Consciousness  --  Alert  ?SpO2  --  95 %  ?O2 Device  --  Room Air  ?Assess: MEWS Score  ?MEWS Temp 0 0  ?MEWS Systolic 0 0  ?MEWS Pulse 3 0  ?MEWS RR 0 0  ?MEWS LOC 0 0  ?MEWS Score 3 0  ?MEWS Score Color Yellow Green  ? ? ?

## 2021-08-23 NOTE — Progress Notes (Signed)
Patients HR up to 135 given cardizem as shcedule and started on o2 at 2l his o2 was 88% ? ? ? ? 08/23/21 0845  ?Assess: MEWS Score  ?BP (!) 116/58  ?Pulse Rate (!) 135  ?Resp 20  ?SpO2 (!) 89 %  ?O2 Device Room Air  ?Assess: MEWS Score  ?MEWS Temp 0  ?MEWS Systolic 0  ?MEWS Pulse 3  ?MEWS RR 0  ?MEWS LOC 0  ?MEWS Score 3  ?MEWS Score Color Yellow  ?Assess: if the MEWS score is Yellow or Red  ?Were vital signs taken at a resting state? Yes  ?Focused Assessment No change from prior assessment  ?Early Detection of Sepsis Score *See Row Information* Low  ?MEWS guidelines implemented *See Row Information* No, vital signs rechecked  ?Notify: Charge Nurse/RN  ?Name of Charge Nurse/RN Notified t  ?Date Charge Nurse/RN Notified 08/23/21  ?Time Charge Nurse/RN Notified [phone]  ?Notify: Provider  ?Provider Name/Title Dr.Shah  ?Date Provider Notified 08/23/21  ?Time Provider Notified [id number]  ?Notification Type Page  ?Notification Reason Other (Comment)  ? ? ?

## 2021-08-23 NOTE — Evaluation (Signed)
Physical Therapy Evaluation ?Patient Details ?Name: [name] ?MRN: [id number] ?DOB: September 28, 1930 ?Today's Date: 08/23/2021 ? ?History of Present Illness ? [name] is a 88 y.o. male with medical history significant of lung adenocarcinoma with prior lower lobe lobectomy, type 2 diabetes, hypertension, dyslipidemia, transient A-fib, and GERD who presented to the ED with loss of consciousness that occurred this morning while he was sitting at his breakfast table coughing.  He was apparently started on Levaquin a little less than a week ago by his oncologist due to concern for bronchitis/pneumonia.  Patient's spouse is at bedside who is the primary historian and states that he was coughing forcefully when his face turned red and he passed out.  He struck his forehead and nose on a table nearby.  Shortly after passing out within a few seconds he regained consciousness.  Spouse states that he is generally weak at home and requires a walker for ambulation.  He apparently has an appointment with his oncologist tomorrow to review PET scan results as well as brain MRI results.  He currently denies any pain or symptomatology. ?  ?Clinical Impression ? Patient demonstrates slightly labored movement for sitting up at bedside with [facility] raised, unsteady on feet having to lean over RW during gait training due to weakness, ambulated in room/hallway without loss of balance, on room air with SpO2 at 92% and limited mostly due to fatigue and SOB.  Patient tolerated sitting up in chair and required instruction in pursed lipped breathing to keep SpO2 over 90% while on room air with fair carryover.  Patient tolerated staying up in chair after therapy with his spouse present in room.  Patient will benefit from continued skilled physical therapy in hospital and recommended venue below to increase strength, balance, endurance for safe ADLs and gait.  ?  ?   ? ?Recommendations for follow up therapy are one component of a  multi-disciplinary discharge planning process, led by the attending physician.  Recommendations may be updated based on patient status, additional functional criteria and insurance authorization. ? ?Follow Up Recommendations Skilled nursing-short term rehab (<3 hours/day) ? ?  ?Assistance Recommended at Discharge Set up Supervision/Assistance  ?Patient can return home with the following ? A lot of help with walking and/or transfers;A little help with bathing/dressing/bathroom;Help with stairs or ramp for entrance;Assistance with cooking/housework ? ?  ?Equipment Recommendations None recommended by PT  ?Recommendations for Other Services ?    ?  ?Functional Status Assessment Patient has had a recent decline in their functional status and demonstrates the ability to make significant improvements in function in a reasonable and predictable amount of time.  ? ?  ?Precautions / Restrictions Precautions ?Precautions: Fall ?Restrictions ?Weight Bearing Restrictions: No  ? ?  ? ?Mobility ? Bed Mobility ?Overal bed mobility: Needs Assistance ?Bed Mobility: Supine to Sit ?  ?  ?Supine to sit: Min guard, Min assist, HOB elevated ?  ?  ?General bed mobility comments: labored movement, increased time ?  ? ?Transfers ?Overall transfer level: Needs assistance ?Equipment used: Rolling walker (2 wheels) ?Transfers: Sit to/from Stand, Bed to chair/wheelchair/BSC ?Sit to Stand: Min assist ?  ?Step pivot transfers: Min assist ?  ?  ?  ?General transfer comment: labored movement, increased time ?  ? ?Ambulation/Gait ?Ambulation/Gait assistance: Min assist ?Gait Distance (Feet): 35 Feet ?Assistive device: Rolling walker (2 wheels) ?Gait Pattern/deviations: Decreased step length - right, Decreased step length - left, Decreased stride length, Trunk flexed ?Gait velocity: decreased ?  ?  ?  General Gait Details: slow labored cadence with flexed trunk, no loss of balance and limited mostly due to fatigue, on room air with SpO2 at  92% ? ?Stairs ?  ?  ?  ?  ?  ? ?Wheelchair Mobility ?  ? ?Modified Rankin (Stroke Patients Only) ?  ? ?  ? ?Balance Overall balance assessment: Needs assistance ?Sitting-balance support: Feet supported, No upper extremity supported ?Sitting balance-Leahy Scale: Fair ?Sitting balance - Comments: fair/good seated at EOB ?  ?Standing balance support: During functional activity, Bilateral upper extremity supported ?Standing balance-Leahy Scale: Fair ?Standing balance comment: using RW ?  ?  ?  ?  ?  ?  ?  ?  ?  ?  ?  ?   ? ? ? ?Pertinent Vitals/Pain Pain Assessment ?Pain Assessment: No/denies pain  ? ? ?Home Living Family/patient expects to be discharged to:: Private residence ?Living Arrangements: Spouse/significant other ?Available Help at Discharge: Family;Available PRN/intermittently ?Type of Home: House ?Home Access: Stairs to enter ?Entrance Stairs-Rails: None ?Entrance Stairs-Number of Steps: 2 ?  ?Home Layout: One level;Laundry or work area in basement;Able to live on main level with bedroom/bathroom;1/2 bath on main level ?Home Equipment: Conservation officer, nature (2 wheels);Wheelchair - manual;BSC/3in1 ?   ?  ?Prior Function Prior Level of Function : Needs assist ?  ?  ?  ?Physical Assist : Mobility (physical);ADLs (physical) ?Mobility (physical): Bed mobility;Transfers;Gait;Stairs ?  ?Mobility Comments: Household and short distanced community ambulation using RW, occasionally drives ?ADLs Comments: assisted by family ?  ? ? ?Hand Dominance  ?   ? ?  ?Extremity/Trunk Assessment  ? Upper Extremity Assessment ?Upper Extremity Assessment: Generalized weakness ?  ? ?Lower Extremity Assessment ?Lower Extremity Assessment: Generalized weakness ?  ? ?Cervical / Trunk Assessment ?Cervical / Trunk Assessment: Kyphotic  ?Communication  ? Communication: No difficulties  ?Cognition Arousal/Alertness: Awake/alert ?Behavior During Therapy: [facility] for tasks assessed/performed ?Overall Cognitive Status: Within Functional Limits for tasks  assessed ?  ?  ?  ?  ?  ?  ?  ?  ?  ?  ?  ?  ?  ?  ?  ?  ?  ?  ?  ? ?  ?General Comments   ? ?  ?Exercises    ? ?Assessment/Plan  ?  ?PT Assessment Patient needs continued PT services  ?PT Problem List Decreased strength;Decreased activity tolerance;Decreased balance;Decreased mobility ? ?   ?  ?PT Treatment Interventions DME instruction;Gait training;Stair training;Functional mobility training;Therapeutic activities;Therapeutic exercise;Patient/family education;Balance training   ? ?PT Goals (Current goals can be found in the Care Plan section)  ?Acute Rehab PT Goals ?Patient Stated Goal: return home with family to assist ?PT Goal Formulation: With patient/family ?Time For Goal Achievement: 09/06/21 ?Potential to Achieve Goals: Good ? ?  ?Frequency Min 3X/week ?  ? ? ?Co-evaluation   ?  ?  ?  ?  ? ? ?  ?AM-PAC PT "6 Clicks" Mobility  ?Outcome Measure Help needed turning from your back to your side while in a flat bed without using bedrails?: A Little ?Help needed moving from lying on your back to sitting on the side of a flat bed without using bedrails?: A Little ?Help needed moving to and from a bed to a chair (including a wheelchair)?: A Little ?Help needed standing up from a chair using your arms (e.g., wheelchair or bedside chair)?: A Little ?Help needed to walk in hospital room?: A Lot ?Help needed climbing 3-5 steps with a railing? : A Lot ?6 Click  Score: 16 ? ?  ?End of Session   ?Activity Tolerance: Patient tolerated treatment well;Patient limited by fatigue ?Patient left: in chair;with call bell/phone within reach;with family/visitor present;with chair alarm set ?Nurse Communication: Mobility status ?PT Visit Diagnosis: Unsteadiness on feet (R26.81);Other abnormalities of gait and mobility (R26.89);Muscle weakness (generalized) (M62.81) ?  ? ?Time: 1030-1103 ?PT Time Calculation (min) (ACUTE ONLY): 33 min ? ? ?Charges:   PT Evaluation ?$PT Eval Moderate Complexity: 1 Mod ?PT Treatments ?$Therapeutic  Activity: 23-37 mins ?  ?   ? ? ?2:13 PM, 08/23/21 ?Lonell Grandchild, MPT ?Physical Therapist with [place] ?[facility] ?[phone] office ?[phone] mobile phone ? ? ?

## 2021-08-23 NOTE — Discharge Instructions (Signed)
Your simvastatin was switched to atorvastatin due to a drug interaction with diltiazem. Please stop taking simvastatin and start taking atorvastatin.  ?

## 2021-08-23 NOTE — TOC Initial Note (Signed)
Transition of Care (TOC) - Initial/Assessment Note  ? ? ?Patient Details  ?Name: [name] ?MRN: [id number] ?Date of Birth: [birth year] ? ?Transition of Care (TOC) CM/SW Contact:    ?Celester Morgan D, LCSW ?Phone Number: ?08/23/2021, 4:31 PM ? ?Clinical Narrative:                 ?Patient from home with spouse. At baseline, he ambulates with a walker when in the home. He does not go out much and does not drive. PT recommends SNF. Agreeable to SNF. Referred to SNF of choice.  ?Review of chart shows that patient was vaccinated on 05/30/20, 07/22/19, and 06/30/19 with [place] vaccination.  ? ?Expected Discharge Plan: [place] ?Barriers to Discharge: Continued Medical Work up ? ? ?Patient Goals and CMS Choice ?Patient states their goals for this hospitalization and ongoing recovery are:: snf then home ?  ?Choice offered to / list presented to : Spouse ? ?Expected Discharge Plan and Services ?Expected Discharge Plan: [place] ?  ?  ?Post Acute Care Choice: [place] ?Living arrangements for the past 2 months: [name] ?                ?  ?  ?  ?  ?  ?  ?  ?  ?  ?  ? ?Prior Living Arrangements/Services ?Living arrangements for the past 2 months: [place] ?Lives with:: Spouse ?Patient language and need for interpreter reviewed:: Yes ?       ?Need for Family Participation in Patient Care: Yes (Comment) ?Care giver support system in place?: Yes (comment) ?Current home services: DME [facility]) ?Criminal Activity/Legal Involvement Pertinent to Current Situation/Hospitalization: No - Comment as needed ? ?Activities of Daily Living ?Home Assistive Devices/Equipment: [facility] (specify type), Shower chair with back ?ADL Screening (condition at time of admission) ?Patient's cognitive ability adequate to safely complete daily activities?: Yes ?Is the patient deaf or have difficulty hearing?: Yes ?Does the patient have difficulty seeing, even when wearing  glasses/contacts?: No ?Does the patient have difficulty concentrating, remembering, or making decisions?: No ?Patient able to express need for assistance with ADLs?: Yes ?Does the patient have difficulty dressing or bathing?: Yes ?Independently performs ADLs?: Yes (appropriate for developmental age) ?Does the patient have difficulty walking or climbing stairs?: Yes ?Weakness of Legs: Both ?Weakness of Arms/Hands: Both ? ?Permission Sought/Granted ?Permission sought to share information with : Family Supports ?  ? Share Information with NAME: Mrs. Nygard, spouse ?   ?   ?   ? ?Emotional Assessment ?  ?  ?Affect (typically observed): Appropriate ?Orientation: : Oriented to Self, Oriented to Place, Oriented to  Time, Oriented to Situation ?Alcohol / Substance Use: Not Applicable ?Psych Involvement: No (comment) ? ?Admission diagnosis:  Syncope [R55] ?Patient Active Problem List  ? Diagnosis Date Noted  ? Syncope 08/22/2021  ? Constipation 02/29/2020  ? Fecal occult blood test positive 02/29/2020  ? Atrial fibrillation, transient ([place]) 01/20/2016  ? S/P lobectomy of lung 12/30/2015  ? History of tobacco abuse 12/14/2015  ? B12 deficiency 12/08/2015  ? History of colonic polyps 12/02/2015  ? Adenocarcinoma of right lung, stage 3 ([place]) 12/02/2015  ? High cholesterol 11/03/2015  ? Dysphagia, pharyngoesophageal phase 07/30/2012  ? Diabetes ([place]) 07/30/2012  ? ?PCP:  Monico Blitz, MD ?Pharmacy:   ?[facility] [address] ?[address] [place] ?[place] [address] ?Phone: [phone] Fax: [phone] ? ?[facility]  [place] ?[address] ?[place] [address] ?Phone: [phone] Fax: [phone] ? ? ? ? ?Social Determinants of Health (SDOH) Interventions ?  ? ?Readmission Risk Interventions ?No flowsheet data found. ? ? ?

## 2021-08-23 NOTE — Consult Note (Addendum)
?Cardiology Consultation:  ? ?Patient ID: [name] ?MRN: [id number]; DOB: [birth year] ? ?Admit date: 08/22/2021 ?Date of Consult: 08/23/2021 ? ?PCP:  Monico Blitz, MD ?  ?[facility] Providers ?Cardiologist:  Rozann Lesches, MD      ? ? ?Patient Profile:  ? ?[name] is a 88 y.o. male with a hx of adenocarcinoma of right lung (s/p right lower lobectomy in 12/2015, concerns for recurrent malignancy by repeat CT in 07/2021), post-operative atrial fibrillation, HTN, HLD, Type 2 DM and prior CVA who is being seen 08/23/2021 for the evaluation of new-onset atrial fibrillation at the request of Dr. Manuella Ghazi. ? ?History of Present Illness:  ? ?Mr. Winrow presented to [facility] ED on 08/22/2021 for evaluation of a syncopal episode which occurred while sitting at his kitchen table. The patient's wife reported that he was vigorously coughing and his face turned red and he passed out. Lost consciousness for several seconds. He fit his face on his fall but they are unsure if he hit his walker or the fridge. He reports having a syncopal episode the week prior which occurred while coughing. No prodromal symptoms. No recent chest pain or palpitations. He does report dyspnea on exertion and a dry cough. No specific orthopnea or PND.  ? ?Initial labs showed WBC 10.1, Hgb 13.2, platelets 358, Na+ 134, K+ 4.0 and creatinine 1.21. Mg 1.5. Initial and repeat Hs Troponin negative at 17 and 16. BNP 167. Negative for COVID and Influenza. TSH pending. EKG showing atrial fibrillation, HR 115 with baseline artifact and LAFB. CXR showed a right pleural effusion and multiple right-sided nodules. CT Head with no acute intracranial abnormalities. Carotid dopplers showed mild disease along his carotids bilaterally but less than 50% stenosis. Echo showed a preserved EF of 60-65% with Grade 1 DD. Unable to assess for WMA but normal RV function and no significant valve abnormalities.  ? ?Of note, he is being followed closely by Oncology as  recent Chest CT in 07/2021 showed findings consistent with recurrent malignancy with parenchymal, nodal and likely lymphangitic metastatic disease. PET was positive for recurrent avid tumor within the right hilar region with signs of pleural metastasis and liver metastasis. MRI Brain showed no intracranial metastatic disease. ? ? ?Past Medical History:  ?Diagnosis Date  ? Adenocarcinoma of lung, stage 3 ([place])   ? Stage IIIA  ? Adenocarcinoma of right lung, stage 3 ([place]) 12/02/2015  ? Arthritis   ? Atrial fibrillation, transient ([place])   ? transient postop, < 24 hours  ? B12 deficiency   ? Cyst of scrotum   ? Diverticulitis   ? GERD (gastroesophageal reflux disease)   ? Hernia   ? History of pneumonia   ? Hyperlipidemia   ? Lung nodule   ? Spiculated right middle lobe nodule, hypermetabolic  ? Stroke [facility])   ? Type 2 diabetes mellitus ([place])   ? ? ?Past Surgical History:  ?Procedure Laterality Date  ? BACK SURGERY    ? BIOPSY  12/30/2020  ? Procedure: BIOPSY;  Surgeon: Harvel Quale, MD;  [facility];  Service: Gastroenterology;;  ? CHOLECYSTECTOMY    ? COLONOSCOPY N/A 11/24/2015  ? Procedure: COLONOSCOPY;  Surgeon: Rogene Houston, MD;  [facility];  Service: Endoscopy;  Laterality: N/A;  730  ? COLONOSCOPY W/ POLYPECTOMY    ? x 5  ? COLONOSCOPY WITH PROPOFOL N/A 12/30/2020  ? Procedure: COLONOSCOPY WITH PROPOFOL;  Surgeon: Harvel Quale, MD;  [facility];  Service: Gastroenterology;  Laterality: N/A;  12:15  ? CYST EXCISION    ? Scrotum  ? ESOPHAGEAL DILATION N/A 12/30/2020  ? Procedure: ESOPHAGEAL DILATION;  Surgeon: Montez Morita, Quillian Quince, MD;  [facility];  Service: Gastroenterology;  Laterality: N/A;  ? ESOPHAGOGASTRODUODENOSCOPY (EGD) WITH PROPOFOL N/A 12/30/2020  ? Procedure: ESOPHAGOGASTRODUODENOSCOPY (EGD) WITH PROPOFOL;  Surgeon: Harvel Quale, MD;  [facility];  Service: Gastroenterology;  Laterality: N/A;  ? EYE  SURGERY Bilateral   ? Cataract with Lens  ? HERNIA REPAIR Right   ? Inguinal  ? IR GENERIC HISTORICAL  02/09/2016  ? IR US GUIDE VASC ACCESS RIGHT 02/09/2016 Arne Cleveland, MD [facility]  ? IR GENERIC HISTORICAL  02/09/2016  ? IR FLUORO GUIDE CV LINE RIGHT 02/09/2016 Arne Cleveland, MD [facility]  ? LUMBAR DISC SURGERY    ? per patient report  ? POLYPECTOMY  12/30/2020  ? Procedure: POLYPECTOMY;  Surgeon: Harvel Quale, MD;  [facility];  Service: Gastroenterology;;  ? PORTACATH PLACEMENT    ? VIDEO ASSISTED THORACOSCOPY (VATS)/ LOBECTOMY Right 12/30/2015  ? Procedure: VIDEO ASSISTED THORACOSCOPY (VATS)/RIGHT MIDDLE LOBECTOMY;  Surgeon: Melrose Nakayama, MD;  Location: [facility];  Service: Thoracic;  Laterality: Right;  ?  ? ?Home Medications:  ?Prior to Admission medications   ?Medication Sig Start Date End Date Taking? Authorizing Provider  ?acetaminophen (TYLENOL) 500 MG tablet Take 500 mg by mouth every 6 (six) hours as needed for moderate pain, mild pain or headache.   Yes [provider]  ?ALPRAZolam Duanne Moron) 0.5 MG tablet Take 0.5 mg by mouth at bedtime. 10/12/20  Yes [provider]  ?aspirin EC 81 MG tablet Take 81 mg by mouth daily.   Yes [provider]  ?benzonatate (TESSALON) 100 MG capsule Take 100 mg by mouth 3 (three) times daily as needed for cough.   Yes [provider]  ?calcium carbonate (OS-CAL - DOSED IN MG OF ELEMENTAL CALCIUM) 1250 (500 Ca) MG tablet Take 1 tablet (500 mg of elemental calcium total) by mouth daily with breakfast. 04/19/16  Yes Kefalas, Manon Hilding, PA-C  ?Carboxymethylcellulose Sodium 0.25 % SOLN Place 1 drop into both eyes daily as needed (Dry eye).   Yes [provider]  ?Cholecalciferol (VITAMIN D) 2000 UNITS CAPS Take 4,000 Units by mouth 2 (two) times daily.   Yes [provider]  ?cyanocobalamin (,VITAMIN B-12,) 1000 MCG/ML injection Inject 1 mL (1,000 mcg total) into the muscle every 30 (thirty)  days. 04/12/20  Yes Derek Jack, MD  ?gabapentin (NEURONTIN) 100 MG capsule Take 100 mg by mouth at bedtime.   Yes [provider]  ?glipiZIDE (GLUCOTROL) 5 MG tablet Take 1 tablet (5 mg total) by mouth 2 (two) times daily before a meal. ?Patient taking differently: Take 2.5 mg by mouth 2 (two) times daily before a meal. 01/05/16  Yes Gold, Wayne E, PA-C  ?lisinopril (ZESTRIL) 5 MG tablet Take 5 mg by mouth daily.   Yes [provider]  ?loratadine (CLARITIN) 10 MG tablet Take 10 mg by mouth daily.   Yes [provider]  ?Meclizine HCl 25 MG CHEW Chew 25 mg by mouth 3 (three) times daily. 09/16/20  Yes [provider]  ?pantoprazole (PROTONIX) 40 MG tablet Take 40 mg by mouth 2 (two) times daily.    Yes [provider]  ?simvastatin (ZOCOR) 40 MG tablet Take 40 mg by mouth at bedtime.    Yes [provider]  ?terazosin (  HYTRIN) 5 MG capsule Take 5 mg by mouth at bedtime.   Yes [provider]  ?ACCU-CHEK AVIVA PLUS test strip USE 5 STRIPS DAILY TO MONITOR FLUCTUATING BLOOD SUGAR 11/13/17   [provider]  ?ACCU-CHEK FASTCLIX LANCETS MISC USE 6 LANCETS DAILY 11/13/17   [provider]  ?lidocaine-prilocaine (EMLA) cream Apply a quarter size amount to port site 1 hour prior to chemo. Do not rub in. Cover with plastic wrap. ?Patient taking differently: Apply 1 application. topically See admin instructions. Apply a quarter size amount to port site 1 hour prior to chemo. Do not rub in. Cover with plastic wrap. 02/01/16   Penland, Kelby Fam, MD  ? ? ?Inpatient Medications: ?Scheduled Meds: ? ALPRAZolam  0.5 mg Oral QHS  ? aspirin EC  81 mg Oral Daily  ? benzonatate  100 mg Oral TID  ? calcium carbonate  1 tablet Oral Q breakfast  ? Chlorhexidine Gluconate Cloth  6 each Topical Daily  ? cholecalciferol  4,000 Units Oral BID  ? cyanocobalamin  1,000 mcg Intramuscular Q30 days  ? diltiazem  10 mg Intravenous Once  ? diltiazem  30 mg Oral Q6H  ?  enoxaparin (LOVENOX) injection  40 mg Subcutaneous Q24H  ? gabapentin  100 mg Oral QHS  ? insulin aspart  0-5 Units Subcutaneous QHS  ? insulin aspart  0-9 Units Subcutaneous TID WC  ? loratadine  10 mg Oral

## 2021-08-23 NOTE — Progress Notes (Signed)
?PROGRESS NOTE ? ? ? ?OCTAVE MONTROSE  KDX:[id number] DOB: 1930/09/09 DOA: 08/22/2021 ?PCP: Monico Blitz, MD ? ? ?Brief Narrative:  ?Per HPI: ?[name] is a 88 y.o. male with medical history significant of lung adenocarcinoma with prior lower lobe lobectomy, type 2 diabetes, hypertension, dyslipidemia, transient A-fib, and GERD who presented to the ED with loss of consciousness that occurred this morning while he was sitting at his breakfast table coughing.  He was apparently started on Levaquin a little less than a week ago by his oncologist due to concern for bronchitis/pneumonia.  Patient's spouse is at bedside who is the primary historian and states that he was coughing forcefully when his face turned red and he passed out.  He struck his forehead and nose on a table nearby.  Shortly after passing out within a few seconds he regained consciousness.  Spouse states that he is generally weak at home and requires a walker for ambulation.  He apparently has an appointment with his oncologist tomorrow to review PET scan results as well as brain MRI results.  He currently denies any pain or symptomatology. ? ?08/23/21: Patient was admitted for syncope that now appears to be vasovagal due to ongoing cough. He has also transitioned into Afib with RVR this am, but is asymptomatic. Cardiology consulted and patient has been started on Cardizem.  ? ? ?Assessment & Plan: ?  ?Principal Problem: ?  Syncope ?Active Problems: ?  Diabetes ([place]) ?  High cholesterol ?  Adenocarcinoma of right lung, stage 3 (HCC) ?  Atrial fibrillation, transient ([place]) ? ?Assessment and Plan: ? ?Syncope ?Likely vasovagal in nature with fits of coughing ?Continue telemetry monitoring with noted Afib ?2D echocardiogram and carotid ultrasound as below without significant abnormalities ?PE not suspected ?PT evaluation for worsening weakness and need for walker at home ?  ?Paroxysmal Afib with some RVR ?CHA2DS2-VASc of 7 and therefore, should be on  Eliquis, will review with oncology regarding any contraindications ?[place] cardiology consultation with initiation of Cardizem every 6 hours ?2D echocardiogram with preserved EF ?TSH within normal limits ?  ?Adenocarcinoma of right lung, stage 3 (HCC) ?Appreciate oncology consultation for further evaluation and management ? ?Hypomagnesemia ?Replete and re-evaluate in am ?  ?High cholesterol ?Continue home statin ?  ?Diabetes ([place]) ?Hold home hypoglycemic agents ?SSI ?  ?  ?DVT prophylaxis:Lovenox ?Code Status: Full ?Family Communication: Tried calling wife with no response 3/15 ?Disposition Plan:  ?Status is: Observation ?The patient will require care spanning > 2 midnights and should be moved to inpatient because: Need for IV meds and close monitoring. ? ?Consultants:  ?Cardiology ?Heme/Onc ? ?Procedures:  ?See below ? ?Antimicrobials:  ?None ? ? ?Subjective: ?Patient seen and evaluated today with no new acute complaints or concerns.  He is having some mild shortness of breath and fits of coughing noted this morning.  His heart rates are elevated.  No acute events overnight. ? ?Objective: ?Vitals:  ? 08/22/21 2105 08/23/21 0508 08/23/21 0845 08/23/21 0857  ?BP: (!) 134/59 (!) 131/48 (!) 116/58 (!) 115/53  ?Pulse: 91 87 (!) 135 91  ?Resp: 20 (!) 22 20 20   ?Temp: 97.6 ?F (36.4 ?C) 97.9 ?F (36.6 ?C)    ?TempSrc: Oral     ?SpO2: 92% 92% (!) 89% 95%  ?Weight:      ?Height:      ? ? ?Intake/Output Summary (Last 24 hours) at 08/23/2021 1131 ?Last data filed at 08/23/2021 0530 ?Gross per 24 hour  ?Intake 570 ml  ?  Output 575 ml  ?Net -5 ml  ? ?Filed Weights  ? 08/22/21 0941 08/22/21 1732  ?Weight: 93.4 kg 91.9 kg  ? ? ?Examination: ? ?General exam: Appears calm and comfortable  ?Respiratory system: Clear to auscultation. Respiratory effort normal. [place] ?Cardiovascular system: S1 & S2 heard, irregular and tachycardic.  ?Gastrointestinal system: Abdomen is soft ?Central nervous system: Alert and awake ?Extremities: No  edema ?Skin: No significant lesions noted ?Psychiatry: Flat affect. ? ? ? ?Data Reviewed: I have personally reviewed following labs and imaging studies ? ?CBC: ?Recent Labs  ?Lab 08/22/21 ?1028 08/23/21 ?4166  ?WBC 10.1 8.9  ?NEUTROABS 7.8*  --   ?HGB 13.2 12.2*  ?HCT 40.1 36.8*  ?MCV 91.8 91.5  ?PLT 358 324  ? ?Basic Metabolic Panel: ?Recent Labs  ?Lab 08/22/21 ?1028 08/23/21 ?0630  ?NA 134* 135  ?K 4.0 3.8  ?CL 101 101  ?CO2 23 24  ?GLUCOSE 178* 124*  ?BUN 12 12  ?CREATININE 1.21 1.10  ?CALCIUM 8.3* 8.3*  ?MG  --  1.5*  ? ?GFR: ?Estimated Creatinine Clearance: 51.9 mL/min (by C-G formula based on SCr of 1.1 mg/dL). ?Liver Function Tests: ?Recent Labs  ?Lab 08/22/21 ?1028  ?AST 33  ?ALT 19  ?ALKPHOS 92  ?BILITOT 0.4  ?PROT 6.2*  ?ALBUMIN 3.0*  ? ?No results for input(s): LIPASE, AMYLASE in the last 168 hours. ?No results for input(s): AMMONIA in the last 168 hours. ?Coagulation Profile: ?No results for input(s): INR, PROTIME in the last 168 hours. ?Cardiac Enzymes: ?No results for input(s): CKTOTAL, CKMB, CKMBINDEX, TROPONINI in the last 168 hours. ?BNP (last 3 results) ?No results for input(s): PROBNP in the last 8760 hours. ?HbA1C: ?Recent Labs  ?  08/22/21 ?1228  ?HGBA1C 7.4*  ? ?CBG: ?Recent Labs  ?Lab 08/22/21 ?1044 08/22/21 ?1805 08/22/21 ?2108 08/23/21 ?0756  ?GLUCAP 166* 100* 199* 127*  ? ?Lipid Profile: ?No results for input(s): CHOL, HDL, LDLCALC, TRIG, CHOLHDL, LDLDIRECT in the last 72 hours. ?Thyroid Function Tests: ?Recent Labs  ?  08/23/21 ?1601  ?TSH 4.489  ? ?Anemia Panel: ?No results for input(s): VITAMINB12, FOLATE, FERRITIN, TIBC, IRON, RETICCTPCT in the last 72 hours. ?Sepsis Labs: ?No results for input(s): PROCALCITON, LATICACIDVEN in the last 168 hours. ? ?Recent Results (from the past 240 hour(s))  ?Resp Panel by RT-PCR (Flu A&B, Covid) Nasopharyngeal Swab     Status: None  ? Collection Time: 08/22/21 12:49 PM  ? Specimen: Nasopharyngeal Swab; Nasopharyngeal(NP) swabs in vial transport medium   ?Result Value Ref Range Status  ? SARS Coronavirus 2 by RT PCR NEGATIVE NEGATIVE Final  ?  Comment: (NOTE) ?SARS-CoV-2 target nucleic acids are NOT DETECTED. ? ?The SARS-CoV-2 RNA is generally detectable in upper respiratory ?specimens during the acute phase of infection. The lowest ?concentration of SARS-CoV-2 viral copies this assay can detect is ?138 copies/mL. A negative result does not preclude SARS-Cov-2 ?infection and should not be used as the sole basis for treatment or ?other patient management decisions. A negative result may occur with  ?improper specimen collection/handling, submission of specimen other ?than nasopharyngeal swab, presence of viral mutation(s) within the ?areas targeted by this assay, and inadequate number of viral ?copies(<138 copies/mL). A negative result must be combined with ?clinical observations, patient history, and epidemiological ?information. The expected result is Negative. ? ?Fact Sheet for Patients:  ?EntrepreneurPulse.com.au ? ?Fact Sheet for Healthcare Providers:  ?IncredibleEmployment.be ? ?This test is no t yet approved or cleared by the [place] and  ?has been authorized for  detection and/or diagnosis of SARS-CoV-2 by ?FDA under an Emergency Use Authorization (EUA). This EUA will remain  ?in effect (meaning this test can be used) for the duration of the ?COVID-19 declaration under Section 564(b)(1) of the Act, 21 ?U.S.C.section 360bbb-3(b)(1), unless the authorization is terminated  ?or revoked sooner.  ? ? ?  ? Influenza A by PCR NEGATIVE NEGATIVE Final  ? Influenza B by PCR NEGATIVE NEGATIVE Final  ?  Comment: (NOTE) ?The Xpert Xpress SARS-CoV-2/FLU/RSV plus assay is intended as an aid ?in the diagnosis of influenza from Nasopharyngeal swab specimens and ?should not be used as a sole basis for treatment. Nasal washings and ?aspirates are unacceptable for Xpert Xpress SARS-CoV-2/FLU/RSV ?testing. ? ?Fact Sheet for  Patients: ?EntrepreneurPulse.com.au ? ?Fact Sheet for Healthcare Providers: ?IncredibleEmployment.be ? ?This test is not yet approved or cleared by the [place] and ?has been aut

## 2021-08-23 NOTE — Hospital Course (Signed)
Per HPI: ?[name] is a 88 y.o. male with medical history significant of lung adenocarcinoma with prior lower lobe lobectomy, type 2 diabetes, hypertension, dyslipidemia, transient A-fib, and GERD who presented to the ED with loss of consciousness that occurred this morning while he was sitting at his breakfast table coughing.  He was apparently started on Levaquin a little less than a week ago by his oncologist due to concern for bronchitis/pneumonia.  Patient's spouse is at bedside who is the primary historian and states that he was coughing forcefully when his face turned red and he passed out.  He struck his forehead and nose on a table nearby.  Shortly after passing out within a few seconds he regained consciousness.  Spouse states that he is generally weak at home and requires a walker for ambulation.  He apparently has an appointment with his oncologist tomorrow to review PET scan results as well as brain MRI results.  He currently denies any pain or symptomatology. ? ?08/23/21: Patient was admitted for syncope that now appears to be vasovagal due to ongoing cough. He has also transitioned into Afib with RVR this am, but is asymptomatic. Cardiology consulted and patient has been started on Cardizem. ?

## 2021-08-23 NOTE — NC FL2 (Signed)
?[place] MEDICAID FL2 LEVEL OF CARE SCREENING TOOL  ?  ? ?IDENTIFICATION  ?Patient Name: ?[name] Birthdate: 24-Sep-1930 Sex: male Admission Date (Current Location): ?08/22/2021  ?[place] and [place] Number: ? [place] and Address:  ?  ?     Provider Number: ?[id number]  ?Attending Physician Name and Address:  ?Manuella Ghazi, Pratik D, DO ? Relative Name and Phone Number:  ?Hadaway,Brenda Spouse [phone]   [phone] ?   ?Current Level of Care: ?Hospital (observation) Recommended Level of Care: ?[place] Prior Approval Number: ?  ? ?Date Approved/Denied: ?  PASRR Number: ?[id number] A ? ?Discharge Plan: ?SNF ?  ? ?Current Diagnoses: ?Patient Active Problem List  ? Diagnosis Date Noted  ? Syncope 08/22/2021  ? Constipation 02/29/2020  ? Fecal occult blood test positive 02/29/2020  ? Atrial fibrillation, transient ([place]) 01/20/2016  ? S/P lobectomy of lung 12/30/2015  ? History of tobacco abuse 12/14/2015  ? B12 deficiency 12/08/2015  ? History of colonic polyps 12/02/2015  ? Adenocarcinoma of right lung, stage 3 ([place]) 12/02/2015  ? High cholesterol 11/03/2015  ? Dysphagia, pharyngoesophageal phase 07/30/2012  ? Diabetes ([place]) 07/30/2012  ? ? ?Orientation RESPIRATION BLADDER Height & Weight   ?  ?Self, Time, Situation, Place ? O2 (3L) Continent Weight: 202 lb 9.6 oz (91.9 kg) ?Height:  5\' 10"  (177.8 cm)  ?BEHAVIORAL SYMPTOMS/MOOD NEUROLOGICAL BOWEL NUTRITION STATUS  ?    Continent Diet (heart healthy/carb modified)  ?AMBULATORY STATUS COMMUNICATION OF NEEDS Skin   ?Limited Assist Verbally Normal ?  ?  ?  ?    ?     ?     ? ? ?Personal Care Assistance Level of Assistance  ?Bathing, Feeding, Dressing Bathing Assistance: Limited assistance ?Feeding assistance: Independent ?Dressing Assistance: Limited assistance ?   ? ?Functional Limitations Info  ?Sight, Hearing, Speech Sight Info: Adequate ?Hearing Info: Adequate ?Speech Info: Adequate  ? ? ?SPECIAL CARE FACTORS FREQUENCY  ?PT (By  licensed PT)   ?  ?PT Frequency: 5x/week ?  ?  ?  ?  ?   ? ? ?Contractures Contractures Info: Not present  ? ? ?Additional Factors Info  ?Code Status, Allergies, Psychotropic Code Status Info: Full Code ?Allergies Info: Morphine and related, amitriptyline omeprale, temapam, zantac ?Psychotropic Info: Xanax ?  ?  ?   ? ?Current Medications (08/23/2021):  This is the current hospital active medication list ?Current Facility-Administered Medications  ?Medication Dose Route Frequency Provider Last Rate Last Admin  ? 0.9 %  sodium chloride infusion  250 mL Intravenous PRN Manuella Ghazi, Pratik D, DO      ? acetaminophen (TYLENOL) tablet 650 mg  650 mg Oral Q6H PRN Manuella Ghazi, Pratik D, DO      ? Or  ? acetaminophen (TYLENOL) suppository 650 mg  650 mg Rectal Q6H PRN Manuella Ghazi, Pratik D, DO      ? acetaminophen (TYLENOL) tablet 500 mg  500 mg Oral Q6H PRN Manuella Ghazi, Pratik D, DO      ? ALPRAZolam Duanne Moron) tablet 0.5 mg  0.5 mg Oral QHS Manuella Ghazi, Pratik D, DO   0.5 mg at 08/22/21 2131  ? aspirin EC tablet 81 mg  81 mg Oral Daily Heath Lark D, DO   81 mg at 08/23/21 0846  ? benzonatate (TESSALON) capsule 100 mg  100 mg Oral TID Manuella Ghazi, Pratik D, DO   100 mg at 08/23/21 1558  ? calcium carbonate (OS-CAL - dosed in mg of elemental calcium) tablet 500 mg of elemental calcium  1  tablet Oral Q breakfast Heath Lark D, DO   500 mg of elemental calcium at 08/23/21 0846  ? Chlorhexidine Gluconate Cloth 2 % PADS 6 each  6 each Topical Daily Heath Lark D, DO   6 each at 08/23/21 1287  ? cholecalciferol (VITAMIN D3) tablet 4,000 Units  4,000 Units Oral BID Heath Lark D, DO   4,000 Units at 08/23/21 1009  ? cyanocobalamin ((VITAMIN B-12)) injection 1,000 mcg  1,000 mcg Intramuscular Q30 days Heath Lark D, DO   1,000 mcg at 08/22/21 1627  ? diltiazem (CARDIZEM) tablet 30 mg  30 mg Oral Q6H Shah, Pratik D, DO   30 mg at 08/23/21 1211  ? enoxaparin (LOVENOX) injection 40 mg  40 mg Subcutaneous Q24H Manuella Ghazi, Pratik D, DO   40 mg at 08/23/21 1558  ? gabapentin  (NEURONTIN) capsule 100 mg  100 mg Oral QHS Shah, Pratik D, DO   100 mg at 08/22/21 2132  ? guaiFENesin-dextromethorphan (ROBITUSSIN DM) 100-10 MG/5ML syrup 5 mL  5 mL Oral Q4H PRN Manuella Ghazi, Pratik D, DO      ? insulin aspart (novoLOG) injection 0-5 Units  0-5 Units Subcutaneous QHS Manuella Ghazi, Pratik D, DO      ? insulin aspart (novoLOG) injection 0-9 Units  0-9 Units Subcutaneous TID WC Shah, Pratik D, DO   2 Units at 08/23/21 1211  ? loratadine (CLARITIN) tablet 10 mg  10 mg Oral Daily Manuella Ghazi, Pratik D, DO   10 mg at 08/23/21 0847  ? ondansetron (ZOFRAN) tablet 4 mg  4 mg Oral Q6H PRN Heath Lark D, DO      ? Or  ? ondansetron (ZOFRAN) injection 4 mg  4 mg Intravenous Q6H PRN Manuella Ghazi, Pratik D, DO      ? pantoprazole (PROTONIX) EC tablet 40 mg  40 mg Oral BID Manuella Ghazi, Pratik D, DO   40 mg at 08/23/21 0847  ? polyvinyl alcohol (LIQUIFILM TEARS) 1.4 % ophthalmic solution 1 drop  1 drop Both Eyes Daily PRN Manuella Ghazi, Pratik D, DO      ? simvastatin (ZOCOR) tablet 40 mg  40 mg Oral QHS Shah, Pratik D, DO   40 mg at 08/22/21 2133  ? sodium chloride flush (NS) 0.9 % injection 3 mL  3 mL Intravenous Q12H Shah, Pratik D, DO   3 mL at 08/22/21 2136  ? sodium chloride flush (NS) 0.9 % injection 3 mL  3 mL Intravenous PRN Manuella Ghazi, Pratik D, DO      ? terazosin (HYTRIN) capsule 5 mg  5 mg Oral QHS Shah, Pratik D, DO   5 mg at 08/22/21 2132  ? ?Facility-Administered Medications Ordered in Other Encounters  ?Medication Dose Route Frequency Provider Last Rate Last Admin  ? heparin lock flush 100 unit/mL  500 Units Intravenous Once Derek Jack, MD      ? sodium chloride flush (NS) 0.9 % injection 10 mL  10 mL Intravenous PRN Derek Jack, MD      ? ? ? ?Discharge Medications: ?Please see discharge summary for a list of discharge medications. ? ?Relevant Imaging Results: ? ?Relevant Lab Results: ? ? ?Additional Information ?SSN [id number]. Chart shows Moderna vaccination on 05/30/20, 07/22/19, 06/30/19 ? ?Westly Hinnant D, LCSW ? ? ? ? ?

## 2021-08-23 NOTE — Consult Note (Signed)
[facility] ?Consultation Oncology ? ?Name: [name]      MRN: [id number]    Location: Z366/Y403-47  Date: 08/23/2021 Time:5:14 PM ? ? ?REFERRING PHYSICIAN: Dr. Manuella Ghazi ? ?REASON FOR CONSULT: Metastatic lung cancer ?  ? ?HISTORY OF PRESENT ILLNESS: [name] is a 88 year old male well-known to me from office visits.  He had a history of stage III (T1N2) adenocarcinoma of the right lung, treated with right lower lobectomy followed by chemoradiation therapy and consolidation immunotherapy completed in March 2019.  He was brought into the ER because of a syncopal episode after a bout of coughing.  He was also found to be in A-fib with RVR.  He was started on diltiazem and is rate controlled at this time.  Dr. Harl Bowie has evaluated him and recommended anticoagulation.  He is sitting in chair and appears to be in no distress. ? ?PAST MEDICAL HISTORY:   ?Past Medical History:  ?Diagnosis Date  ? Adenocarcinoma of lung, stage 3 ([place])   ? Stage IIIA  ? Adenocarcinoma of right lung, stage 3 ([place]) 12/02/2015  ? Arthritis   ? Atrial fibrillation, transient ([place])   ? transient postop, < 24 hours  ? B12 deficiency   ? Cyst of scrotum   ? Diverticulitis   ? GERD (gastroesophageal reflux disease)   ? Hernia   ? History of pneumonia   ? Hyperlipidemia   ? Lung nodule   ? Spiculated right middle lobe nodule, hypermetabolic  ? Stroke [facility])   ? Type 2 diabetes mellitus ([place])   ? ? ?ALLERGIES: ?Allergies  ?Allergen Reactions  ? Morphine And Related Other (See Comments)  ?  confusion  ? Amitriptyline Other (See Comments)  ?  Confusion  ? Omeprazole Other (See Comments)  ?  Other reaction(s): Delirium  ? Temazepam Other (See Comments)  ?  Other reaction(s): Nightmares  ? Zantac [Ranitidine] Other (See Comments)  ?  Other reaction(s): Delirium  ? ?   ?MEDICATIONS: I have reviewed the patient's current medications.   ?  ?PAST SURGICAL HISTORY ?Past Surgical History:  ?Procedure Laterality Date  ? BACK SURGERY    ? BIOPSY  12/30/2020   ? Procedure: BIOPSY;  Surgeon: Harvel Quale, MD;  [facility];  Service: Gastroenterology;;  ? CHOLECYSTECTOMY    ? COLONOSCOPY N/A 11/24/2015  ? Procedure: COLONOSCOPY;  Surgeon: Rogene Houston, MD;  [facility];  Service: Endoscopy;  Laterality: N/A;  730  ? COLONOSCOPY W/ POLYPECTOMY    ? x 5  ? COLONOSCOPY WITH PROPOFOL N/A 12/30/2020  ? Procedure: COLONOSCOPY WITH PROPOFOL;  Surgeon: Harvel Quale, MD;  [facility];  Service: Gastroenterology;  Laterality: N/A;  12:15  ? CYST EXCISION    ? Scrotum  ? ESOPHAGEAL DILATION N/A 12/30/2020  ? Procedure: ESOPHAGEAL DILATION;  Surgeon: Montez Morita, Quillian Quince, MD;  [facility];  Service: Gastroenterology;  Laterality: N/A;  ? ESOPHAGOGASTRODUODENOSCOPY (EGD) WITH PROPOFOL N/A 12/30/2020  ? Procedure: ESOPHAGOGASTRODUODENOSCOPY (EGD) WITH PROPOFOL;  Surgeon: Harvel Quale, MD;  [facility];  Service: Gastroenterology;  Laterality: N/A;  ? EYE SURGERY Bilateral   ? Cataract with Lens  ? HERNIA REPAIR Right   ? Inguinal  ? IR GENERIC HISTORICAL  02/09/2016  ? IR US GUIDE VASC ACCESS RIGHT 02/09/2016 Arne Cleveland, MD [facility]  ? IR GENERIC HISTORICAL  02/09/2016  ? IR FLUORO GUIDE CV LINE RIGHT 02/09/2016 Arne Cleveland, MD [facility]  ?  LUMBAR DISC SURGERY    ? per patient report  ? POLYPECTOMY  12/30/2020  ? Procedure: POLYPECTOMY;  Surgeon: Harvel Quale, MD;  [facility];  Service: Gastroenterology;;  ? PORTACATH PLACEMENT    ? VIDEO ASSISTED THORACOSCOPY (VATS)/ LOBECTOMY Right 12/30/2015  ? Procedure: VIDEO ASSISTED THORACOSCOPY (VATS)/RIGHT MIDDLE LOBECTOMY;  Surgeon: Melrose Nakayama, MD;  Location: [place];  Service: Thoracic;  Laterality: Right;  ? ? ?FAMILY HISTORY: ?Family History  ?Problem Relation Age of Onset  ? Colon cancer Brother   ? ? ?SOCIAL HISTORY: ? reports that he quit smoking about 17 years ago. His smoking use included  cigarettes. He has never used smokeless tobacco. He reports that he does not drink alcohol and does not use drugs. ? ?PERFORMANCE STATUS: ?The patient's performance status is 3 - Symptomatic, >50% confined to bed ? ?PHYSICAL EXAM: ?Most Recent Vital Signs: Blood pressure 104/60, pulse 77, temperature (!) 97.5 ?F (36.4 ?C), resp. rate 18, height 5\' 10"  (1.778 m), weight 202 lb 9.6 oz (91.9 kg), SpO2 96 %. ?BP 104/60 (BP Location: Left Arm)   Pulse 77   Temp (!) 97.5 ?F (36.4 ?C)   Resp 18   Ht 5\' 10"  (1.778 m)   Wt 202 lb 9.6 oz (91.9 kg)   SpO2 96%   BMI 29.07 kg/m?  ?General appearance: alert, cooperative, and appears stated age ?Head: Normocephalic, without obvious abnormality, atraumatic ?Lungs:  Bilateral air entry with decreased sounds in the right base. ?Heart: irregularly irregular rhythm ?Extremities:  Trace edema in the right leg.  No edema in the left leg. ?Neurologic: Grossly normal ? ?LABORATORY DATA:  ?Results for orders placed or performed during the hospital encounter of 08/22/21 (from the past 48 hour(s))  ?CBC WITH DIFFERENTIAL     Status: Abnormal  ? Collection Time: 08/22/21 10:28 AM  ?Result Value Ref Range  ? WBC 10.1 4.0 - 10.5 K/uL  ? RBC 4.37 4.22 - 5.81 MIL/uL  ? Hemoglobin 13.2 13.0 - 17.0 g/dL  ? HCT 40.1 39.0 - 52.0 %  ? MCV 91.8 80.0 - 100.0 fL  ? MCH 30.2 26.0 - 34.0 pg  ? MCHC 32.9 30.0 - 36.0 g/dL  ? RDW 12.7 11.5 - 15.5 %  ? Platelets 358 150 - 400 K/uL  ? nRBC 0.0 0.0 - 0.2 %  ? Neutrophils Relative % 76 %  ? Neutro Abs 7.8 (H) 1.7 - 7.7 K/uL  ? Lymphocytes Relative 11 %  ? Lymphs Abs 1.2 0.7 - 4.0 K/uL  ? Monocytes Relative 11 %  ? Monocytes Absolute 1.1 (H) 0.1 - 1.0 K/uL  ? Eosinophils Relative 1 %  ? Eosinophils Absolute 0.1 0.0 - 0.5 K/uL  ? Basophils Relative 1 %  ? Basophils Absolute 0.1 0.0 - 0.1 K/uL  ? Immature Granulocytes 0 %  ? Abs Immature Granulocytes 0.04 0.00 - 0.07 K/uL  ?  Comment: Performed at [facility], [address], [place], [place] [address]   ?Comprehensive metabolic panel     Status: Abnormal  ? Collection Time: 08/22/21 10:28 AM  ?Result Value Ref Range  ? Sodium 134 (L) 135 - 145 mmol/L  ? Potassium 4.0 3.5 - 5.1 mmol/L  ? Chloride 101 98 - 111 mmol/L  ? CO2 23 22 - 32 mmol/L  ? Glucose, Bld 178 (H) 70 - 99 mg/dL  ?  Comment: Glucose reference range applies only to samples taken after fasting for at least 8 hours.  ? BUN 12  8 - 23 mg/dL  ? Creatinine, Ser 1.21 0.61 - 1.24 mg/dL  ? Calcium 8.3 (L) 8.9 - 10.3 mg/dL  ? Total Protein 6.2 (L) 6.5 - 8.1 g/dL  ? Albumin 3.0 (L) 3.5 - 5.0 g/dL  ? AST 33 15 - 41 U/L  ? ALT 19 0 - 44 U/L  ? Alkaline Phosphatase 92 38 - 126 U/L  ? Total Bilirubin 0.4 0.3 - 1.2 mg/dL  ? GFR, Estimated 57 (L) >60 mL/min  ?  Comment: (NOTE) ?Calculated using the CKD-EPI Creatinine Equation (2021) ?  ? Anion gap 10 5 - 15  ?  Comment: Performed at [facility], [address], [place], [place] [address]  ?Troponin I (High Sensitivity)     Status: None  ? Collection Time: 08/22/21 10:28 AM  ?Result Value Ref Range  ? Troponin I (High Sensitivity) 17 <18 ng/L  ?  Comment: (NOTE) ?Elevated high sensitivity troponin I (hsTnI) values and significant  ?changes across serial measurements may suggest ACS but many other  ?chronic and acute conditions are known to elevate hsTnI results.  ?Refer to the "Links" section for chest pain algorithms and additional  ?guidance. ?Performed at [facility], [address] [place], [place] [address] ?  ?Brain natriuretic peptide     Status: Abnormal  ? Collection Time: 08/22/21 10:28 AM  ?Result Value Ref Range  ? B Natriuretic Peptide 167.0 (H) 0.0 - 100.0 pg/mL  ?  Comment: Performed at [facility], [address] [place],  [address]  ?CBG monitoring, ED     Status: Abnormal  ? Collection Time: 08/22/21 10:44 AM  ?Result Value Ref Range  ? Glucose-Capillary 166 (H) 70 - 99 mg/dL  ?  Comment: Glucose reference range applies only to samples taken after fasting for at least 8 hours.  ?Troponin I  (High Sensitivity)     Status: None  ? Collection Time: 08/22/21 12:28 PM  ?Result Value Ref Range  ? Troponin I (High Sensitivity) 16 <18 ng/L  ?  Comment: (NOTE) ?Elevated high sensitivity troponin I (hsTnI) values

## 2021-08-23 NOTE — Plan of Care (Signed)
?  Problem: Acute Rehab PT Goals(only PT should resolve) ?Goal: Pt Will Go Supine/Side To Sit ?Outcome: Progressing ?Flowsheets (Taken 08/23/2021 1415) ?Pt will go Supine/Side to Sit: ? with supervision ? with min guard assist ?Goal: Patient Will Transfer Sit To/From Stand ?Outcome: Progressing ?Flowsheets (Taken 08/23/2021 1415) ?Patient will transfer sit to/from stand: ? with supervision ? with min guard assist ?Goal: Pt Will Transfer Bed To Chair/Chair To Bed ?Outcome: Progressing ?Flowsheets (Taken 08/23/2021 1415) ?Pt will Transfer Bed to Chair/Chair to Bed: ? with supervision ? min guard assist ?Goal: Pt Will Ambulate ?Outcome: Progressing ?Flowsheets (Taken 08/23/2021 1415) ?Pt will Ambulate: ? 75 feet ? with min guard assist ? with rolling walker ?  ?2:15 PM, 08/23/21 ?Lonell Grandchild, MPT ?Physical Therapist with [place] ?[facility] ?[phone] office ?[phone] mobile phone ? ?

## 2021-08-24 ENCOUNTER — Inpatient Hospital Stay (HOSPITAL_COMMUNITY): Payer: Medicare Other

## 2021-08-24 ENCOUNTER — Ambulatory Visit (HOSPITAL_COMMUNITY): Payer: Medicare Other

## 2021-08-24 DIAGNOSIS — R55 Syncope and collapse: Secondary | ICD-10-CM | POA: Diagnosis not present

## 2021-08-24 LAB — PROTIME-INR
INR: 1.1 (ref 0.8–1.2)
Prothrombin Time: 13.9 seconds (ref 11.4–15.2)

## 2021-08-24 LAB — GLUCOSE, CAPILLARY
Glucose-Capillary: 146 mg/dL — ABNORMAL HIGH (ref 70–99)
Glucose-Capillary: 186 mg/dL — ABNORMAL HIGH (ref 70–99)

## 2021-08-24 MED ORDER — DILTIAZEM HCL ER COATED BEADS 120 MG PO CP24
120.0000 mg | ORAL_CAPSULE | Freq: Every day | ORAL | Status: DC
  Administered 2021-08-24: 120 mg via ORAL
  Filled 2021-08-24: qty 1

## 2021-08-24 MED ORDER — ENOXAPARIN SODIUM 40 MG/0.4ML IJ SOSY
40.0000 mg | PREFILLED_SYRINGE | INTRAMUSCULAR | Status: DC
Start: 2021-08-24 — End: 2021-08-24
  Filled 2021-08-24: qty 0.4

## 2021-08-24 MED ORDER — APIXABAN 5 MG PO TABS
5.0000 mg | ORAL_TABLET | Freq: Two times a day (BID) | ORAL | Status: DC
  Administered 2021-08-24: 5 mg via ORAL
  Filled 2021-08-24: qty 1

## 2021-08-24 MED ORDER — APIXABAN 5 MG PO TABS
5.0000 mg | ORAL_TABLET | Freq: Two times a day (BID) | ORAL | 2 refills | Status: DC
Start: 2021-08-24 — End: 2021-09-01

## 2021-08-24 MED ORDER — ENOXAPARIN SODIUM 40 MG/0.4ML IJ SOSY: 40.0000 mg | PREFILLED_SYRINGE | INTRAMUSCULAR | Status: DC

## 2021-08-24 MED ORDER — ATORVASTATIN CALCIUM 20 MG PO TABS: 20.0000 mg | ORAL_TABLET | Freq: Every day | ORAL | 1 refills | Status: DC

## 2021-08-24 MED ORDER — HEPARIN SOD (PORK) LOCK FLUSH 100 UNIT/ML IV SOLN
500.0000 [IU] | Freq: Once | INTRAVENOUS | Status: AC
  Administered 2021-08-24: 500 [IU] via INTRAVENOUS
  Filled 2021-08-24: qty 5

## 2021-08-24 MED ORDER — DILTIAZEM HCL ER COATED BEADS 120 MG PO CP24: 120.0000 mg | ORAL_CAPSULE | Freq: Every day | ORAL | 1 refills | Status: DC

## 2021-08-24 NOTE — Discharge Planning (Signed)
Oncology Discharge Planning Admission Note ? ?[facility] ?Address: [address] [place], [place] [address] ?Hours of Operation:  8am - 5pm, Monday - Friday  ?Clinic Contact Information:  [phone] ? ?Oncology Care Team: ?Medical Oncologist:  Dr. Derek Jack ? ? Dr. Delton Coombes is aware of this hospital admission dated 08/23/21 and has assessed patient at bedside. The cancer center will follow [place] inpatient care to assist with discharge planning as indicated by the oncologist.  We will arrange necessary follow up closer to discharge. ? ?Disclaimer:  This [place] note does not imply a formal consult request has been made by the admitting attending for this admission or there will be an inpatient consult completed by oncology.  Please request oncology consults as per standard process as indicated. ?

## 2021-08-24 NOTE — Progress Notes (Signed)
Nsg Discharge Note ? ?Admit Date:  08/22/2021 ?Discharge date: 08/24/2021 ?  ?[name] to be D/C'd Home per MD order.  AVS completed.  Copy for chart, and copy for patient signed, and dated. ?Deaccessed right chest port-CDI. Reviewed d/c paperwork with patient and wife. They had some additional questions so Dr. Manuella Ghazi came up to talk with them. Wheeled stable patient and belongings to main entrance where he was picked up by his wife. ?Patient/caregiver able to verbalize understanding. ? ?Discharge Medication: ?Allergies as of 08/24/2021   ? ?   Reactions  ? Morphine And Related Other (See Comments)  ? confusion  ? Amitriptyline Other (See Comments)  ? Confusion  ? Omeprazole Other (See Comments)  ? Other reaction(s): Delirium  ? Temazepam Other (See Comments)  ? Other reaction(s): Nightmares  ? Zantac [ranitidine] Other (See Comments)  ? Other reaction(s): Delirium  ? ?  ? ?  ?Medication List  ?  ? ?STOP taking these medications   ? ?aspirin EC 81 MG tablet ?  ?lisinopril 5 MG tablet ?Commonly known as: ZESTRIL ?  ?simvastatin 40 MG tablet ?Commonly known as: ZOCOR ?  ? ?  ? ?TAKE these medications   ? ?Accu-Chek Aviva Plus test strip ?Generic drug: glucose blood ?USE 5 STRIPS DAILY TO MONITOR FLUCTUATING BLOOD SUGAR ?  ?Accu-Chek FastClix Lancets Misc ?USE 6 LANCETS DAILY ?  ?acetaminophen 500 MG tablet ?Commonly known as: TYLENOL ?Take 500 mg by mouth every 6 (six) hours as needed for moderate pain, mild pain or headache. ?  ?ALPRAZolam 0.5 MG tablet ?Commonly known as: Duanne Moron ?Take 0.5 mg by mouth at bedtime. ?  ?apixaban 5 MG Tabs tablet ?Commonly known as: ELIQUIS ?Take 1 tablet (5 mg total) by mouth 2 (two) times daily. ?  ?atorvastatin 20 MG tablet ?Commonly known as: LIPITOR ?Take 1 tablet (20 mg total) by mouth daily. ?Start taking on: August 25, 2021 ?  ?benzonatate 100 MG capsule ?Commonly known as: TESSALON ?Take 100 mg by mouth 3 (three) times daily as needed for cough. ?  ?calcium carbonate 1250 (500  Ca) MG tablet ?Commonly known as: OS-CAL - dosed in mg of elemental calcium ?Take 1 tablet (500 mg of elemental calcium total) by mouth daily with breakfast. ?  ?Carboxymethylcellulose Sodium 0.25 % Soln ?Place 1 drop into both eyes daily as needed (Dry eye). ?  ?cyanocobalamin 1000 MCG/ML injection ?Commonly known as: (VITAMIN B-12) ?Inject 1 mL (1,000 mcg total) into the muscle every 30 (thirty) days. ?  ?diltiazem 120 MG 24 hr capsule ?Commonly known as: CARDIZEM CD ?Take 1 capsule (120 mg total) by mouth daily. ?  ?gabapentin 100 MG capsule ?Commonly known as: NEURONTIN ?Take 100 mg by mouth at bedtime. ?  ?glipiZIDE 5 MG tablet ?Commonly known as: GLUCOTROL ?Take 1 tablet (5 mg total) by mouth 2 (two) times daily before a meal. ?What changed: how much to take ?  ?lidocaine-prilocaine cream ?Commonly known as: EMLA ?Apply a quarter size amount to port site 1 hour prior to chemo. Do not rub in. Cover with plastic wrap. ?What changed:  ?how much to take ?how to take this ?when to take this ?  ?loratadine 10 MG tablet ?Commonly known as: CLARITIN ?Take 10 mg by mouth daily. ?  ?Meclizine HCl 25 MG Chew ?Chew 25 mg by mouth 3 (three) times daily. ?  ?pantoprazole 40 MG tablet ?Commonly known as: PROTONIX ?Take 40 mg by mouth 2 (two) times daily. ?  ?terazosin 5 MG capsule ?  Commonly known as: HYTRIN ?Take 5 mg by mouth at bedtime. ?  ?Vitamin D 50 MCG (2000 UT) Caps ?Take 4,000 Units by mouth 2 (two) times daily. ?  ? ?  ? ? ?Discharge Assessment: ?Vitals:  ? 08/24/21 0544 08/24/21 1337  ?BP: (!) 92/40 (!) 149/63  ?Pulse: 86 96  ?Resp: 19 18  ?Temp: 97.6 ?F (36.4 ?C) 97.7 ?F (36.5 ?C)  ?SpO2: 94% 92%  ? Skin clean, dry and intact without evidence of skin break down, no evidence of skin tears noted. ?IV catheter discontinued intact. Site without signs and symptoms of complications - no redness or edema noted at insertion site, patient denies c/o pain - only slight tenderness at site.  Dressing with slight pressure  applied. ? ?D/c Instructions-Education: ?Discharge instructions given to patient/family with verbalized understanding. ?D/c education completed with patient/family including follow up instructions, medication list, d/c activities limitations if indicated, with other d/c instructions as indicated by MD - patient able to verbalize understanding, all questions fully answered. ?Patient instructed to return to ED, call 911, or call MD for any changes in condition.  ?Patient escorted via [place], and D/C home via private auto. ? ?Santa Lighter, RN ?08/24/2021 2:00 PM  ?

## 2021-08-24 NOTE — Care Management Important Message (Signed)
Important Message ? ?Patient Details  ?Name: [name] ?MRN: [id number] ?Date of Birth: 10-06-1930 ? ? ?Medicare Important Message Given:  N/A - LOS <3 / Initial given by admissions ? ? ? ? ?Tommy Medal ?08/24/2021, 11:47 AM ?

## 2021-08-24 NOTE — Progress Notes (Signed)
SATURATION QUALIFICATIONS: (This note is used to comply with regulatory documentation for home oxygen) ? ?Patient Saturations on Room Air at Rest = 96% ? ?Patient Saturations on Room Air while Ambulating = 90% ?0 ?Patient Saturations on 0 Liters of oxygen while Ambulating = 0% ? ?Please briefly explain why patient needs home oxygen: ?

## 2021-08-24 NOTE — Plan of Care (Signed)
  Problem: Education: Goal: Knowledge of General Education information will improve Description: Including pain rating scale, medication(s)/side effects and non-pharmacologic comfort measures Outcome: Progressing   Problem: Health Behavior/Discharge Planning: Goal: Ability to manage health-related needs will improve Outcome: Progressing   Problem: Clinical Measurements: Goal: Respiratory complications will improve Outcome: Progressing   

## 2021-08-24 NOTE — Progress Notes (Signed)
Patient ID: [name], male   DOB: 08/03/1930, 88 y.o.   MRN: [id number] ? ? ? ?Request made for supraclavicular node biopsy ? ?Pt with Hx Lung Ca 2019 ?Developed worsening cough and SOB ?To ED 3/14 after syncopal episode at home ?Cough so much he passed out and fell and hit head ?Following with [facility] ? ?Previous OP PET 08/10/21: ?IMPRESSION: ?1. Examination is positive for recurrent FDG avid tumor within the ?right hilar region. Recurrent tumor encases the right hilar ?structures and invades the mediastinum into the subcarinal region. ?2. 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 ?3. FDG avid nodal metastasis within the mediastinum, bilateral ?supraclavicular regions, and right CP angle. ?4. Multifocal FDG avid liver metastases. ?5. Aortic Atherosclerosis (ICD10-I70.0) and Emphysema (ICD10-J43.9). ?6. Coronary artery calcifications. ?  ?  ?Discussed with Dr Earleen Newport ?He has reviewed chart and images ? ?IP bx would require transport to [facility] and back at high cost ?Anticoagulation can be coordinated for OP bx ?He spoke to Dr Delton Coombes ? ?Bx to be scheduled as OP asap ? ?I have a call into OP Scheduler for time and date ? ? ?

## 2021-08-24 NOTE — Discharge Summary (Signed)
Physician Discharge Summary  ?PEIRCE DEVENEY PNT:[id number] DOB: 12/04/1930 DOA: 08/22/2021 ? ?PCP: Monico Blitz, MD ? ?Admit date: 08/22/2021 ? ?Discharge date: 08/24/2021 ? ?Admitted From:Home ? ?Disposition:  Home ? ?Recommendations for Outpatient Follow-up:  ?Follow up with PCP in 1-2 weeks ?Follow-up with cardiology as scheduled on 4/14 and remain on Eliquis twice daily as well as Cardizem 120 mg CD daily ?Lisinopril discontinued as well as aspirin ?Continue other home medications as prior ?Outpatient lymph node biopsy scheduled by radiology with plans to hold Eliquis 48 hours prior.  Close follow-up with oncology will be arranged ? ?Home Health: Refused SNF and home health services ? ?Equipment/Devices: None ? ?Discharge Condition:Stable ? ?CODE STATUS: Full ? ?Diet recommendation: Heart Healthy/carb modified ? ?Brief/Interim Summary: ?Per HPI: ?[name] is a 88 y.o. male with medical history significant of lung adenocarcinoma with prior lower lobe lobectomy, type 2 diabetes, hypertension, dyslipidemia, transient A-fib, and GERD who presented to the ED with loss of consciousness that occurred this morning while he was sitting at his breakfast table coughing.  He was apparently started on Levaquin a little less than a week ago by his oncologist due to concern for bronchitis/pneumonia.  Patient's spouse is at bedside who is the primary historian and states that he was coughing forcefully when his face turned red and he passed out.  He struck his forehead and nose on a table nearby.  Shortly after passing out within a few seconds he regained consciousness.  Spouse states that he is generally weak at home and requires a walker for ambulation.  He apparently has an appointment with his oncologist tomorrow to review PET scan results as well as brain MRI results.  He currently denies any pain or symptomatology. ?  ?08/23/21: Patient was admitted for syncope that now appears to be vasovagal due to ongoing cough. He has  also transitioned into Afib with RVR this am, but is asymptomatic. Cardiology consulted and patient has been started on Cardizem.  ? ?08/24/2021: Patient has been transitioned to Cardizem 120 mg CD daily and is noted to be in sinus rhythm with no significant issues or concerns noted this morning.  He has also been transitioned to Eliquis twice daily.  Plans have been made for outpatient lymph node biopsy and close follow-up with oncology.  No other acute events or concerns noted throughout the course of this admission. ? ?Discharge Diagnoses:  ?Principal Problem: ?  Syncope ?Active Problems: ?  Diabetes ([place]) ?  High cholesterol ?  Adenocarcinoma of right lung, stage 3 (HCC) ?  Atrial fibrillation, transient ([place]) ? ?Principal discharge diagnosis: Syncope-vasovagal in the setting of cough.  New paroxysmal atrial fibrillation with RVR.  Adenocarcinoma of right lung. ? ?Discharge Instructions ? ?Discharge Instructions   ? ? Diet - low sodium heart healthy   Complete by: As directed ?  ? Increase activity slowly   Complete by: As directed ?  ? ?  ? ?Allergies as of 08/24/2021   ? ?   Reactions  ? Morphine And Related Other (See Comments)  ? confusion  ? Amitriptyline Other (See Comments)  ? Confusion  ? Omeprazole Other (See Comments)  ? Other reaction(s): Delirium  ? Temazepam Other (See Comments)  ? Other reaction(s): Nightmares  ? Zantac [ranitidine] Other (See Comments)  ? Other reaction(s): Delirium  ? ?  ? ?  ?Medication List  ?  ? ?STOP taking these medications   ? ?aspirin EC 81 MG tablet ?  ?lisinopril 5 MG  tablet ?Commonly known as: ZESTRIL ?  ?simvastatin 40 MG tablet ?Commonly known as: ZOCOR ?  ? ?  ? ?TAKE these medications   ? ?Accu-Chek Aviva Plus test strip ?Generic drug: glucose blood ?USE 5 STRIPS DAILY TO MONITOR FLUCTUATING BLOOD SUGAR ?  ?Accu-Chek FastClix Lancets Misc ?USE 6 LANCETS DAILY ?  ?acetaminophen 500 MG tablet ?Commonly known as: TYLENOL ?Take 500 mg by mouth every 6 (six) hours as  needed for moderate pain, mild pain or headache. ?  ?ALPRAZolam 0.5 MG tablet ?Commonly known as: Duanne Moron ?Take 0.5 mg by mouth at bedtime. ?  ?apixaban 5 MG Tabs tablet ?Commonly known as: ELIQUIS ?Take 1 tablet (5 mg total) by mouth 2 (two) times daily. ?  ?atorvastatin 20 MG tablet ?Commonly known as: LIPITOR ?Take 1 tablet (20 mg total) by mouth daily. ?Start taking on: August 25, 2021 ?  ?benzonatate 100 MG capsule ?Commonly known as: TESSALON ?Take 100 mg by mouth 3 (three) times daily as needed for cough. ?  ?calcium carbonate 1250 (500 Ca) MG tablet ?Commonly known as: OS-CAL - dosed in mg of elemental calcium ?Take 1 tablet (500 mg of elemental calcium total) by mouth daily with breakfast. ?  ?Carboxymethylcellulose Sodium 0.25 % Soln ?Place 1 drop into both eyes daily as needed (Dry eye). ?  ?cyanocobalamin 1000 MCG/ML injection ?Commonly known as: (VITAMIN B-12) ?Inject 1 mL (1,000 mcg total) into the muscle every 30 (thirty) days. ?  ?diltiazem 120 MG 24 hr capsule ?Commonly known as: CARDIZEM CD ?Take 1 capsule (120 mg total) by mouth daily. ?  ?gabapentin 100 MG capsule ?Commonly known as: NEURONTIN ?Take 100 mg by mouth at bedtime. ?  ?glipiZIDE 5 MG tablet ?Commonly known as: GLUCOTROL ?Take 1 tablet (5 mg total) by mouth 2 (two) times daily before a meal. ?What changed: how much to take ?  ?lidocaine-prilocaine cream ?Commonly known as: EMLA ?Apply a quarter size amount to port site 1 hour prior to chemo. Do not rub in. Cover with plastic wrap. ?What changed:  ?how much to take ?how to take this ?when to take this ?  ?loratadine 10 MG tablet ?Commonly known as: CLARITIN ?Take 10 mg by mouth daily. ?  ?Meclizine HCl 25 MG Chew ?Chew 25 mg by mouth 3 (three) times daily. ?  ?pantoprazole 40 MG tablet ?Commonly known as: PROTONIX ?Take 40 mg by mouth 2 (two) times daily. ?  ?terazosin 5 MG capsule ?Commonly known as: HYTRIN ?Take 5 mg by mouth at bedtime. ?  ?Vitamin D 50 MCG (2000 UT) Caps ?Take 4,000  Units by mouth 2 (two) times daily. ?  ? ?  ? ? Follow-up Information   ? ? Erma Heritage, PA-C Follow up on 09/22/2021.   ?Specialties: Physician Assistant, Cardiology ?Why: Cardiology Hospital Follow-up on 09/22/2021 at 2:30 PM. ?Contact information: ?[address] ?[place] [address] ?[phone] ? ? ?  ?  ? ? Monico Blitz, MD. Schedule an appointment as soon as possible for a visit in 1 week(s).   ?Specialty: Internal Medicine ?Contact information: ?[address] ?[place] [address] ?[phone] ? ? ?  ?  ? ?  ?  ? ?  ? ?Allergies  ?Allergen Reactions  ? Morphine And Related Other (See Comments)  ?  confusion  ? Amitriptyline Other (See Comments)  ?  Confusion  ? Omeprazole Other (See Comments)  ?  Other reaction(s): Delirium  ? Temazepam Other (See Comments)  ?  Other reaction(s): Nightmares  ?  Zantac [Ranitidine] Other (See Comments)  ?  Other reaction(s): Delirium  ? ? ?Consultations: ?Oncology ?Cardiology ? ? ?Procedures/Studies: ?CT HEAD WO CONTRAST ? ?Result Date: 08/22/2021 ?CLINICAL DATA:  Syncope/presyncope, cerebrovascular cause suspected; Facial trauma, blunt EXAM: CT HEAD WITHOUT CONTRAST CT MAXILLOFACIAL WITHOUT CONTRAST TECHNIQUE: Multidetector CT imaging of the head and maxillofacial structures were performed using the standard protocol without intravenous contrast. Multiplanar CT image reconstructions of the maxillofacial structures were also generated. RADIATION DOSE REDUCTION: This exam was performed according to the departmental dose-optimization program which includes automated exposure control, adjustment of the mA and/or kV according to patient size and/or use of iterative reconstruction technique. COMPARISON:  None. FINDINGS: CT HEAD FINDINGS Brain: No acute intracranial hemorrhage, mass effect, or edema. Gray-white differentiation is preserved. Prominence of the ventricles and sulci reflects parenchymal volume loss with parietal dominance. No extra-axial collection. Vascular: There is  intracranial atherosclerotic calcification at the skull base. Skull: Unremarkable. Other: Mastoid air cells are clear. CT MAXILLOFACIAL FINDINGS Osseous: No definite acute facial fracture. Age indeterminat

## 2021-08-24 NOTE — Progress Notes (Incomplete)
Attending Note ? ?Patient seen and discussed with PA Ahmed Prima, I agree with her documentation. Admitted with post tussive syncope, no additional cardiac workup is indicated. Also this admit noted to be in afib, has been paroxysmal this admission. Started on diltiazem for rate control, transitioned to 120mg  daily long acting today. Long discussions about anticoag in setting of falls, recurrent cancer. At this time family favors starting eliquis, started on 5mg  bid.  ? ? ?No further cardiac recs at this time, we will sign off inpatient care ? ? ?Carlyle Dolly MD ?

## 2021-08-24 NOTE — TOC Transition Note (Signed)
Transition of Care (TOC) - CM/SW Discharge Note ? ? ?Patient Details  ?Name: SANUEL LADNIER ?MRN: [id number] ?Date of Birth: [birth year] ? ?Transition of Care (TOC) CM/SW Contact:  ?Wynne Jury D, LCSW ?Phone Number: ?08/24/2021, 12:39 PM ? ? ?Clinical Narrative:    ?Mrs. Dillard has made the decision to take patient home at discharge. She declines HH currently.  ? ? ?Final next level of care: Home/Self Care ?Barriers to Discharge: No Barriers Identified ? ? ?Patient Goals and CMS Choice ?Patient states their goals for this hospitalization and ongoing recovery are:: snf then home ?  ?Choice offered to / list presented to : Spouse ? ?Discharge Placement ?  ?           ?  ?  ?  ?  ? ?Discharge Plan and Services ?  ?  ?Post Acute Care Choice: [place]          ?  ?  ?  ?  ?  ?  ?  ?  ?  ?  ? ?Social Determinants of Health (SDOH) Interventions ?  ? ? ?Readmission Risk Interventions ?No flowsheet data found. ? ? ? ? ?

## 2021-08-24 NOTE — Progress Notes (Signed)
ANTICOAGULATION CONSULT NOTE - Initial Consult ? ?Pharmacy Consult for apixaban ?Indication: atrial fibrillation ? ?Allergies  ?Allergen Reactions  ? Morphine And Related Other (See Comments)  ?  confusion  ? Amitriptyline Other (See Comments)  ?  Confusion  ? Omeprazole Other (See Comments)  ?  Other reaction(s): Delirium  ? Temazepam Other (See Comments)  ?  Other reaction(s): Nightmares  ? Zantac [Ranitidine] Other (See Comments)  ?  Other reaction(s): Delirium  ? ? ?Patient Measurements: ?Height: 5\' 10"  (177.8 cm) ?Weight: 91.9 kg (202 lb 9.6 oz) ?IBW/kg (Calculated) : 73 ? ?Vital Signs: ?Temp: 97.6 ?F (36.4 ?C) (03/16 0544) ?Temp Source: Oral (03/15 2139) ?BP: 92/40 (03/16 0544) ?Pulse Rate: 86 (03/16 0544) ? ?Labs: ?Recent Labs  ?  08/22/21 ?1028 08/22/21 ?1228 08/23/21 ?4818 08/24/21 ?0725  ?HGB 13.2  --  12.2*  --   ?HCT 40.1  --  36.8*  --   ?PLT 358  --  324  --   ?LABPROT  --   --   --  13.9  ?INR  --   --   --  1.1  ?CREATININE 1.21  --  1.10  --   ?TROPONINIHS 17 16  --   --   ? ? ?Estimated Creatinine Clearance: 51.9 mL/min (by C-G formula based on SCr of 1.1 mg/dL). ? ? ?Medical History: ?Past Medical History:  ?Diagnosis Date  ? Adenocarcinoma of lung, stage 3 ([place])   ? Stage IIIA  ? Adenocarcinoma of right lung, stage 3 ([place]) 12/02/2015  ? Arthritis   ? Atrial fibrillation, transient ([place])   ? transient postop, < 24 hours  ? B12 deficiency   ? Cyst of scrotum   ? Diverticulitis   ? GERD (gastroesophageal reflux disease)   ? Hernia   ? History of pneumonia   ? Hyperlipidemia   ? Lung nodule   ? Spiculated right middle lobe nodule, hypermetabolic  ? Stroke [facility])   ? Type 2 diabetes mellitus ([place])   ? ? ?Medications:  ?Medications Prior to Admission  ?Medication Sig Dispense Refill Last Dose  ? acetaminophen (TYLENOL) 500 MG tablet Take 500 mg by mouth every 6 (six) hours as needed for moderate pain, mild pain or headache.   Past Week  ? ALPRAZolam (XANAX) 0.5 MG tablet Take 0.5 mg by mouth at bedtime.    08/21/2021  ? aspirin EC 81 MG tablet Take 81 mg by mouth daily.   08/22/2021  ? benzonatate (TESSALON) 100 MG capsule Take 100 mg by mouth 3 (three) times daily as needed for cough.   08/21/2021  ? calcium carbonate (OS-CAL - DOSED IN MG OF ELEMENTAL CALCIUM) 1250 (500 Ca) MG tablet Take 1 tablet (500 mg of elemental calcium total) by mouth daily with breakfast. 30 tablet 2 08/21/2021  ? Carboxymethylcellulose Sodium 0.25 % SOLN Place 1 drop into both eyes daily as needed (Dry eye).   UNK  ? Cholecalciferol (VITAMIN D) 2000 UNITS CAPS Take 4,000 Units by mouth 2 (two) times daily.   08/21/2021  ? cyanocobalamin (,VITAMIN B-12,) 1000 MCG/ML injection Inject 1 mL (1,000 mcg total) into the muscle every 30 (thirty) days. 1 mL 12 Past Month  ? gabapentin (NEURONTIN) 100 MG capsule Take 100 mg by mouth at bedtime.   08/21/2021  ? glipiZIDE (GLUCOTROL) 5 MG tablet Take 1 tablet (5 mg total) by mouth 2 (two) times daily before a meal. (Patient taking differently: Take 2.5 mg by mouth 2 (two) times daily before a meal.)  60 tablet 1 08/22/2021  ? lisinopril (ZESTRIL) 5 MG tablet Take 5 mg by mouth daily.   08/21/2021  ? loratadine (CLARITIN) 10 MG tablet Take 10 mg by mouth daily.   08/21/2021  ? Meclizine HCl 25 MG CHEW Chew 25 mg by mouth 3 (three) times daily.   08/21/2021  ? pantoprazole (PROTONIX) 40 MG tablet Take 40 mg by mouth 2 (two) times daily.    08/21/2021  ? simvastatin (ZOCOR) 40 MG tablet Take 40 mg by mouth at bedtime.    08/21/2021  ? terazosin (HYTRIN) 5 MG capsule Take 5 mg by mouth at bedtime.   08/21/2021  ? ACCU-CHEK AVIVA PLUS test strip USE 5 STRIPS DAILY TO MONITOR FLUCTUATING BLOOD SUGAR  4   ? ACCU-CHEK FASTCLIX LANCETS MISC USE 6 LANCETS DAILY  5   ? lidocaine-prilocaine (EMLA) cream Apply a quarter size amount to port site 1 hour prior to chemo. Do not rub in. Cover with plastic wrap. (Patient taking differently: Apply 1 application. topically See admin instructions. Apply a quarter size amount to port  site 1 hour prior to chemo. Do not rub in. Cover with plastic wrap.) 30 g 3 UNK  ? ? ?Assessment: ?88 year old male well-known to me from office visits.  He had a history of stage III (T1N2) adenocarcinoma of the right lung, treated with right lower lobectomy followed by chemoradiation therapy and consolidation immunotherapy completed in March 2019.  He was brought into the ER because of a syncopal episode after a bout of coughing.  He was also found to be in A-fib with RVR.   Dr. Harl Bowie recommended anticoagulation. Pharmacy asked to initiate apixaban for afib ?Scr 1.1, wt 91.9kg  ? ?Goal of Therapy:  ?Monitor platelets by anticoagulation protocol: Yes ?  ?Plan:  ?Eliquis 5mg  po bid ?Educate on eliquis ?Monitor for S/S of bleeding ? ?Isac Sarna, BS Pharm D, BCPS ?Clinical Pharmacist ?Pager [phone] ?08/24/2021,9:15 AM ? ? ?

## 2021-08-24 NOTE — Plan of Care (Signed)
?  Problem: Education: ?Goal: Knowledge of General Education information will improve ?Description: Including pain rating scale, medication(s)/side effects and non-pharmacologic comfort measures ?08/24/2021 1356 by Santa Lighter, RN ?Outcome: Adequate for Discharge ?08/24/2021 1223 by Santa Lighter, RN ?Outcome: Progressing ?  ?Problem: Health Behavior/Discharge Planning: ?Goal: Ability to manage health-related needs will improve ?08/24/2021 1356 by Santa Lighter, RN ?Outcome: Adequate for Discharge ?08/24/2021 1223 by Santa Lighter, RN ?Outcome: Progressing ?  ?Problem: Clinical Measurements: ?Goal: Ability to maintain clinical measurements within normal limits will improve ?Outcome: Adequate for Discharge ?Goal: Will remain free from infection ?Outcome: Adequate for Discharge ?Goal: Diagnostic test results will improve ?Outcome: Adequate for Discharge ?Goal: Respiratory complications will improve ?08/24/2021 1356 by Santa Lighter, RN ?Outcome: Adequate for Discharge ?08/24/2021 1223 by Santa Lighter, RN ?Outcome: Progressing ?Goal: Cardiovascular complication will be avoided ?Outcome: Adequate for Discharge ?  ?Problem: Activity: ?Goal: Risk for activity intolerance will decrease ?Outcome: Adequate for Discharge ?  ?Problem: Nutrition: ?Goal: Adequate nutrition will be maintained ?Outcome: Adequate for Discharge ?  ?Problem: Coping: ?Goal: Level of anxiety will decrease ?Outcome: Adequate for Discharge ?  ?Problem: Elimination: ?Goal: Will not experience complications related to bowel motility ?Outcome: Adequate for Discharge ?Goal: Will not experience complications related to urinary retention ?Outcome: Adequate for Discharge ?  ?Problem: Pain Managment: ?Goal: General experience of comfort will improve ?Outcome: Adequate for Discharge ?  ?Problem: Safety: ?Goal: Ability to remain free from injury will improve ?Outcome: Adequate for Discharge ?  ?Problem: Skin Integrity: ?Goal: Risk for impaired skin integrity  will decrease ?Outcome: Adequate for Discharge ?  ?

## 2021-08-24 NOTE — Progress Notes (Addendum)
? ?Progress Note ? ?Patient Name: [name] ?Date of Encounter: 08/24/2021 ? ?[facility] Cardiologist: Rozann Lesches, MD  ? ?Subjective  ? ?Breathing improved. Still with a non-productive cough. No chest pain or palpitations. Unaware of his arrhythmia. Confused about his diet order as he was told by Oncology to be NPO after midnight but a diet order was entered this AM.  ? ?Inpatient Medications  ?  ?Scheduled Meds: ? ALPRAZolam  0.5 mg Oral QHS  ? apixaban  5 mg Oral BID  ? aspirin EC  81 mg Oral Daily  ? atorvastatin  20 mg Oral Daily  ? benzonatate  100 mg Oral TID  ? calcium carbonate  1 tablet Oral Q breakfast  ? Chlorhexidine Gluconate Cloth  6 each Topical Daily  ? cholecalciferol  4,000 Units Oral BID  ? cyanocobalamin  1,000 mcg Intramuscular Q30 days  ? diltiazem  30 mg Oral Q6H  ? gabapentin  100 mg Oral QHS  ? insulin aspart  0-5 Units Subcutaneous QHS  ? insulin aspart  0-9 Units Subcutaneous TID WC  ? loratadine  10 mg Oral Daily  ? pantoprazole  40 mg Oral BID  ? sodium chloride flush  3 mL Intravenous Q12H  ? terazosin  5 mg Oral QHS  ? ?Continuous Infusions: ? sodium chloride    ? ?PRN Meds: ?sodium chloride, acetaminophen **OR** acetaminophen, acetaminophen, guaiFENesin-dextromethorphan, ondansetron **OR** ondansetron (ZOFRAN) IV, polyvinyl alcohol, sodium chloride flush  ? ?Vital Signs  ?  ?Vitals:  ? 08/23/21 2139 08/24/21 0103 08/24/21 0525 08/24/21 0544  ?BP: (!) 131/42 (!) 111/56 (!) 109/46 (!) 92/40  ?Pulse: 77 84 80 86  ?Resp: 19   19  ?Temp: (!) 97.5 ?F (36.4 ?C)   97.6 ?F (36.4 ?C)  ?TempSrc: Oral     ?SpO2: 92%   94%  ?Weight:      ?Height:      ? ? ?Intake/Output Summary (Last 24 hours) at 08/24/2021 0933 ?Last data filed at 08/23/2021 1500 ?Gross per 24 hour  ?Intake 50 ml  ?Output --  ?Net 50 ml  ? ?Last 3 Weights 08/22/2021 08/22/2021 08/08/2021  ?Weight (lbs) 202 lb 9.6 oz 206 lb 206 lb 9.6 oz  ?Weight (kg) 91.899 kg 93.441 kg 93.713 kg  ?   ? ?Telemetry  ?  ?In rate-controlled  atrial fibrillation yesterday but back in NSR this AM with HR in the 80's to 90's.  - Personally Reviewed ? ?ECG  ?  ?No new tracings.  ? ?Physical Exam  ? ?GEN: Pleasant elderly male appearing in no acute distress.   ?Neck: No JVD ?Cardiac: RRR, no murmurs, rubs, or gallops.  ?Respiratory: Clear to auscultation bilaterally. ?GI: Soft, nontender, non-distended  ?MS: No pitting edema; No deformity. ?Neuro:  Nonfocal  ?Psych: Normal affect  ? ?Labs  ?  ?High Sensitivity Troponin:   ?Recent Labs  ?Lab 08/22/21 ?1028 08/22/21 ?1228  ?TROPONINIHS 17 16  ?   ?Chemistry ?Recent Labs  ?Lab 08/22/21 ?1028 08/23/21 ?7425  ?NA 134* 135  ?K 4.0 3.8  ?CL 101 101  ?CO2 23 24  ?GLUCOSE 178* 124*  ?BUN 12 12  ?CREATININE 1.21 1.10  ?CALCIUM 8.3* 8.3*  ?MG  --  1.5*  ?PROT 6.2*  --   ?ALBUMIN 3.0*  --   ?AST 33  --   ?ALT 19  --   ?ALKPHOS 92  --   ?BILITOT 0.4  --   ?GFRNONAA 57* >60  ?ANIONGAP 10 10  ?  ?  Lipids No results for input(s): CHOL, TRIG, HDL, LABVLDL, LDLCALC, CHOLHDL in the last 168 hours.  ?Hematology ?Recent Labs  ?Lab 08/22/21 ?1028 08/23/21 ?8563  ?WBC 10.1 8.9  ?RBC 4.37 4.02*  ?HGB 13.2 12.2*  ?HCT 40.1 36.8*  ?MCV 91.8 91.5  ?MCH 30.2 30.3  ?MCHC 32.9 33.2  ?RDW 12.7 12.8  ?PLT 358 324  ? ?Thyroid  ?Recent Labs  ?Lab 08/23/21 ?1497  ?TSH 4.489  ?  ?BNP ?Recent Labs  ?Lab 08/22/21 ?1028  ?BNP 167.0*  ?  ?DDimer No results for input(s): DDIMER in the last 168 hours.  ? ?Radiology  ?  ?CT HEAD WO CONTRAST ? ?Result Date: 08/22/2021 ?CLINICAL DATA:  Syncope/presyncope, cerebrovascular cause suspected; Facial trauma, blunt EXAM: CT HEAD WITHOUT CONTRAST CT MAXILLOFACIAL WITHOUT CONTRAST TECHNIQUE: Multidetector CT imaging of the head and maxillofacial structures were performed using the standard protocol without intravenous contrast. Multiplanar CT image reconstructions of the maxillofacial structures were also generated. RADIATION DOSE REDUCTION: This exam was performed according to the departmental dose-optimization  program which includes automated exposure control, adjustment of the mA and/or kV according to patient size and/or use of iterative reconstruction technique. COMPARISON:  None. FINDINGS: CT HEAD FINDINGS Brain: No acute intracranial hemorrhage, mass effect, or edema. Gray-white differentiation is preserved. Prominence of the ventricles and sulci reflects parenchymal volume loss with parietal dominance. No extra-axial collection. Vascular: There is intracranial atherosclerotic calcification at the skull base. Skull: Unremarkable. Other: Mastoid air cells are clear. CT MAXILLOFACIAL FINDINGS Osseous: No definite acute facial fracture. Age indeterminate right nasal bone fractures. Degenerative changes at the temporomandibular joints. Orbits: No intraorbital hematoma. Sinuses: No significant opacification. Soft tissues: Possible mild right anterior frontal scalp soft tissue swelling. IMPRESSION: No evidence of acute intracranial injury. No definite acute facial fracture. Age-indeterminate right nasal bone fractures; correlate with exam. Electronically Signed   By: Macy Mis M.D.   On: 08/22/2021 11:25  ? ?US Carotid Bilateral ? ?Result Date: 08/22/2021 ?CLINICAL DATA:  Syncope. EXAM: BILATERAL CAROTID DUPLEX ULTRASOUND TECHNIQUE: Pearline Cables scale imaging, color Doppler and duplex ultrasound were performed of bilateral carotid and vertebral arteries in the neck. COMPARISON:  None. FINDINGS: Criteria: Quantification of carotid stenosis is based on velocity parameters that correlate the residual internal carotid diameter with NASCET-based stenosis levels, using the diameter of the distal internal carotid lumen as the denominator for stenosis measurement. The following velocity measurements were obtained: RIGHT ICA: 87/15 cm/sec CCA: 02/6 cm/sec SYSTOLIC ICA/CCA RATIO:  0.9 ECA: 105 cm/sec LEFT ICA: 134/20 cm/sec CCA: 378/5 cm/sec SYSTOLIC ICA/CCA RATIO:  1.2 ECA: 92 cm/sec RIGHT CAROTID ARTERY: External carotid artery is  patent with normal waveform. Echogenic plaque with shadowing in the proximal internal carotid artery. Normal waveforms and velocities in the internal carotid artery. RIGHT VERTEBRAL ARTERY: Antegrade flow and normal waveform in the right vertebral artery. LEFT CAROTID ARTERY: Echogenic plaque at the left carotid bulb. External carotid artery is patent with normal waveform. Peak systolic velocity in the proximal and mid internal carotid artery is within normal limits. Peak systolic velocity in the distal internal carotid artery is mildly elevated measuring 134 cm/sec. Normal carotid artery ratios. LEFT VERTEBRAL ARTERY: Antegrade flow and normal waveform in the left vertebral artery. IMPRESSION: 1. Mild atherosclerotic disease involving bilateral carotid arteries. Estimated degree of stenosis in the internal carotid arteries is less than 50% bilaterally. 2. Patent vertebral arteries with antegrade flow. Electronically Signed   By: Markus Daft M.D.   On: 08/22/2021 15:24  ? ?DG Chest Portable  1 View ? ?Result Date: 08/22/2021 ?CLINICAL DATA:  Syncope cough.  Fall today. EXAM: PORTABLE CHEST 1 VIEW COMPARISON:  Chest 08/01/2021 FINDINGS: Right IJ Port-A-Cath is stable. Heart size is exaggerated by low lung volumes. Progressive right pleural effusion is present. Multiple right-sided nodules again noted. Perihilar opacities present. No significant left-sided disease is present. IMPRESSION: 1. Progressive right pleural effusion. 2. Multiple right-sided nodules. 3. Low lung volumes. Electronically Signed   By: San Morelle M.D.   On: 08/22/2021 11:12  ? ?CT Maxillofacial Wo Contrast ? ?Result Date: 08/22/2021 ?CLINICAL DATA:  Syncope/presyncope, cerebrovascular cause suspected; Facial trauma, blunt EXAM: CT HEAD WITHOUT CONTRAST CT MAXILLOFACIAL WITHOUT CONTRAST TECHNIQUE: Multidetector CT imaging of the head and maxillofacial structures were performed using the standard protocol without intravenous contrast.  Multiplanar CT image reconstructions of the maxillofacial structures were also generated. RADIATION DOSE REDUCTION: This exam was performed according to the departmental dose-optimization program which includes

## 2021-08-28 ENCOUNTER — Other Ambulatory Visit (HOSPITAL_COMMUNITY): Payer: Self-pay | Admitting: Internal Medicine

## 2021-08-28 DIAGNOSIS — C7800 Secondary malignant neoplasm of unspecified lung: Secondary | ICD-10-CM

## 2021-08-30 ENCOUNTER — Encounter (HOSPITAL_COMMUNITY): Payer: Self-pay | Admitting: Radiology

## 2021-08-30 ENCOUNTER — Inpatient Hospital Stay (HOSPITAL_COMMUNITY): Payer: Medicare Other

## 2021-08-30 ENCOUNTER — Other Ambulatory Visit: Payer: Self-pay

## 2021-08-30 ENCOUNTER — Emergency Department (HOSPITAL_COMMUNITY): Payer: Medicare Other

## 2021-08-30 ENCOUNTER — Inpatient Hospital Stay (HOSPITAL_COMMUNITY)
Admission: EM | Admit: 2021-08-30 | Discharge: 2021-09-01 | DRG: 180 | Disposition: A | Discharge status: Hospice | Payer: Medicare Other | Attending: Family Medicine | Admitting: Family Medicine

## 2021-08-30 ENCOUNTER — Encounter (HOSPITAL_COMMUNITY): Payer: Self-pay | Admitting: Emergency Medicine

## 2021-08-30 DIAGNOSIS — Z902 Acquired absence of lung [part of]: Secondary | ICD-10-CM

## 2021-08-30 DIAGNOSIS — Z87891 Personal history of nicotine dependence: Secondary | ICD-10-CM

## 2021-08-30 DIAGNOSIS — E538 Deficiency of other specified B group vitamins: Secondary | ICD-10-CM | POA: Diagnosis present

## 2021-08-30 DIAGNOSIS — Z961 Presence of intraocular lens: Secondary | ICD-10-CM | POA: Diagnosis present

## 2021-08-30 DIAGNOSIS — C3491 Malignant neoplasm of unspecified part of right bronchus or lung: Secondary | ICD-10-CM | POA: Diagnosis present

## 2021-08-30 DIAGNOSIS — K219 Gastro-esophageal reflux disease without esophagitis: Secondary | ICD-10-CM | POA: Diagnosis present

## 2021-08-30 DIAGNOSIS — I4891 Unspecified atrial fibrillation: Secondary | ICD-10-CM | POA: Diagnosis present

## 2021-08-30 DIAGNOSIS — Z9221 Personal history of antineoplastic chemotherapy: Secondary | ICD-10-CM

## 2021-08-30 DIAGNOSIS — J9691 Respiratory failure, unspecified with hypoxia: Secondary | ICD-10-CM | POA: Insufficient documentation

## 2021-08-30 DIAGNOSIS — Z881 Allergy status to other antibiotic agents status: Secondary | ICD-10-CM | POA: Diagnosis not present

## 2021-08-30 DIAGNOSIS — Z7189 Other specified counseling: Secondary | ICD-10-CM | POA: Diagnosis not present

## 2021-08-30 DIAGNOSIS — J9601 Acute respiratory failure with hypoxia: Secondary | ICD-10-CM | POA: Diagnosis present

## 2021-08-30 DIAGNOSIS — N400 Enlarged prostate without lower urinary tract symptoms: Secondary | ICD-10-CM | POA: Diagnosis present

## 2021-08-30 DIAGNOSIS — R0902 Hypoxemia: Secondary | ICD-10-CM | POA: Diagnosis present

## 2021-08-30 DIAGNOSIS — Z7901 Long term (current) use of anticoagulants: Secondary | ICD-10-CM

## 2021-08-30 DIAGNOSIS — E1165 Type 2 diabetes mellitus with hyperglycemia: Secondary | ICD-10-CM | POA: Diagnosis present

## 2021-08-30 DIAGNOSIS — Z66 Do not resuscitate: Secondary | ICD-10-CM | POA: Diagnosis not present

## 2021-08-30 DIAGNOSIS — Z885 Allergy status to narcotic agent status: Secondary | ICD-10-CM

## 2021-08-30 DIAGNOSIS — Z515 Encounter for palliative care: Secondary | ICD-10-CM

## 2021-08-30 DIAGNOSIS — E785 Hyperlipidemia, unspecified: Secondary | ICD-10-CM | POA: Diagnosis present

## 2021-08-30 DIAGNOSIS — I480 Paroxysmal atrial fibrillation: Secondary | ICD-10-CM | POA: Diagnosis present

## 2021-08-30 DIAGNOSIS — Z923 Personal history of irradiation: Secondary | ICD-10-CM

## 2021-08-30 DIAGNOSIS — Z888 Allergy status to other drugs, medicaments and biological substances status: Secondary | ICD-10-CM

## 2021-08-30 DIAGNOSIS — Z20822 Contact with and (suspected) exposure to covid-19: Secondary | ICD-10-CM | POA: Diagnosis present

## 2021-08-30 DIAGNOSIS — C3490 Malignant neoplasm of unspecified part of unspecified bronchus or lung: Secondary | ICD-10-CM | POA: Diagnosis present

## 2021-08-30 DIAGNOSIS — Z7984 Long term (current) use of oral hypoglycemic drugs: Secondary | ICD-10-CM

## 2021-08-30 DIAGNOSIS — R1314 Dysphagia, pharyngoesophageal phase: Secondary | ICD-10-CM | POA: Diagnosis not present

## 2021-08-30 DIAGNOSIS — J90 Pleural effusion, not elsewhere classified: Secondary | ICD-10-CM | POA: Diagnosis present

## 2021-08-30 DIAGNOSIS — Z8673 Personal history of transient ischemic attack (TIA), and cerebral infarction without residual deficits: Secondary | ICD-10-CM

## 2021-08-30 DIAGNOSIS — J189 Pneumonia, unspecified organism: Secondary | ICD-10-CM | POA: Diagnosis present

## 2021-08-30 DIAGNOSIS — E119 Type 2 diabetes mellitus without complications: Secondary | ICD-10-CM

## 2021-08-30 DIAGNOSIS — R338 Other retention of urine: Secondary | ICD-10-CM | POA: Diagnosis not present

## 2021-08-30 DIAGNOSIS — Z79899 Other long term (current) drug therapy: Secondary | ICD-10-CM | POA: Diagnosis not present

## 2021-08-30 DIAGNOSIS — Z9049 Acquired absence of other specified parts of digestive tract: Secondary | ICD-10-CM

## 2021-08-30 DIAGNOSIS — M1990 Unspecified osteoarthritis, unspecified site: Secondary | ICD-10-CM | POA: Diagnosis present

## 2021-08-30 LAB — LACTATE DEHYDROGENASE, PLEURAL OR PERITONEAL FLUID: LD, Fluid: 359 U/L — ABNORMAL HIGH (ref 3–23)

## 2021-08-30 LAB — CBC WITH DIFFERENTIAL/PLATELET
Abs Immature Granulocytes: 0.04 10*3/uL (ref 0.00–0.07)
Basophils Absolute: 0.1 10*3/uL (ref 0.0–0.1)
Basophils Relative: 1 %
Eosinophils Absolute: 0.2 10*3/uL (ref 0.0–0.5)
Eosinophils Relative: 2 %
HCT: 38.4 % — ABNORMAL LOW (ref 39.0–52.0)
Hemoglobin: 12.6 g/dL — ABNORMAL LOW (ref 13.0–17.0)
Immature Granulocytes: 0 %
Lymphocytes Relative: 11 %
Lymphs Abs: 1.2 10*3/uL (ref 0.7–4.0)
MCH: 30.1 pg (ref 26.0–34.0)
MCHC: 32.8 g/dL (ref 30.0–36.0)
MCV: 91.6 fL (ref 80.0–100.0)
Monocytes Absolute: 0.9 10*3/uL (ref 0.1–1.0)
Monocytes Relative: 8 %
Neutro Abs: 8.3 10*3/uL — ABNORMAL HIGH (ref 1.7–7.7)
Neutrophils Relative %: 78 %
Platelets: 367 10*3/uL (ref 150–400)
RBC: 4.19 MIL/uL — ABNORMAL LOW (ref 4.22–5.81)
RDW: 13 % (ref 11.5–15.5)
WBC: 10.7 10*3/uL — ABNORMAL HIGH (ref 4.0–10.5)
nRBC: 0 % (ref 0.0–0.2)

## 2021-08-30 LAB — GLUCOSE, PLEURAL OR PERITONEAL FLUID: Glucose, Fluid: 158 mg/dL

## 2021-08-30 LAB — COMPREHENSIVE METABOLIC PANEL
ALT: 25 U/L (ref 0–44)
AST: 47 U/L — ABNORMAL HIGH (ref 15–41)
Albumin: 2.9 g/dL — ABNORMAL LOW (ref 3.5–5.0)
Alkaline Phosphatase: 119 U/L (ref 38–126)
Anion gap: 9 (ref 5–15)
BUN: 10 mg/dL (ref 8–23)
CO2: 24 mmol/L (ref 22–32)
Calcium: 8.4 mg/dL — ABNORMAL LOW (ref 8.9–10.3)
Chloride: 103 mmol/L (ref 98–111)
Creatinine, Ser: 1.08 mg/dL (ref 0.61–1.24)
GFR, Estimated: >60 mL/min (ref >60)
Glucose, Bld: 187 mg/dL — ABNORMAL HIGH (ref 70–99)
Potassium: 4.3 mmol/L (ref 3.5–5.1)
Sodium: 136 mmol/L (ref 135–145)
Total Bilirubin: 0.6 mg/dL (ref 0.3–1.2)
Total Protein: 6.1 g/dL — ABNORMAL LOW (ref 6.5–8.1)

## 2021-08-30 LAB — BODY FLUID CELL COUNT WITH DIFFERENTIAL
Eos, Fluid: 5 %
Lymphs, Fluid: 51 %
Monocyte-Macrophage-Serous Fluid: 8 % — ABNORMAL LOW (ref 50–90)
Neutrophil Count, Fluid: 36 % — ABNORMAL HIGH (ref 0–25)
Total Nucleated Cell Count, Fluid: 1686 cu mm — ABNORMAL HIGH (ref 0–1000)

## 2021-08-30 LAB — ALBUMIN, PLEURAL OR PERITONEAL FLUID: Albumin, Fluid: 1.9 g/dL

## 2021-08-30 LAB — RESP PANEL BY RT-PCR (FLU A&B, COVID) ARPGX2
Influenza A by PCR: NEGATIVE
Influenza B by PCR: NEGATIVE
SARS Coronavirus 2 by RT PCR: NEGATIVE

## 2021-08-30 LAB — GLUCOSE, CAPILLARY
Glucose-Capillary: 146 mg/dL — ABNORMAL HIGH (ref 70–99)
Glucose-Capillary: 169 mg/dL — ABNORMAL HIGH (ref 70–99)

## 2021-08-30 LAB — GRAM STAIN: Gram Stain: NONE SEEN

## 2021-08-30 LAB — PROTEIN, PLEURAL OR PERITONEAL FLUID: Total protein, fluid: 3.3 g/dL

## 2021-08-30 LAB — LACTIC ACID, PLASMA: Lactic Acid, Venous: 1.6 mmol/L (ref 0.5–1.9)

## 2021-08-30 MED ORDER — DILTIAZEM HCL ER COATED BEADS 120 MG PO CP24
120.0000 mg | ORAL_CAPSULE | Freq: Every day | ORAL | Status: DC
  Administered 2021-08-30 – 2021-09-01 (×3): 120 mg via ORAL
  Filled 2021-08-30 (×3): qty 1

## 2021-08-30 MED ORDER — APIXABAN 5 MG PO TABS
5.0000 mg | ORAL_TABLET | Freq: Two times a day (BID) | ORAL | Status: DC
  Administered 2021-08-30 – 2021-09-01 (×4): 5 mg via ORAL
  Filled 2021-08-30 (×4): qty 1

## 2021-08-30 MED ORDER — LORATADINE 10 MG PO TABS: 10.0000 mg | ORAL_TABLET | Freq: Every day | ORAL | Status: DC | PRN

## 2021-08-30 MED ORDER — BISACODYL 10 MG RE SUPP: 10.0000 mg | Freq: Every day | RECTAL | Status: DC | PRN

## 2021-08-30 MED ORDER — SODIUM CHLORIDE 0.9 % IV SOLN
1.0000 g | INTRAVENOUS | Status: DC
  Administered 2021-08-30: 1 g via INTRAVENOUS
  Filled 2021-08-30: qty 10

## 2021-08-30 MED ORDER — TERAZOSIN HCL 5 MG PO CAPS
5.0000 mg | ORAL_CAPSULE | Freq: Every day | ORAL | Status: DC
  Administered 2021-08-30 – 2021-08-31 (×2): 5 mg via ORAL
  Filled 2021-08-30 (×2): qty 1

## 2021-08-30 MED ORDER — POLYETHYLENE GLYCOL 3350 17 G PO PACK
17.0000 g | PACK | Freq: Every day | ORAL | Status: DC | PRN
  Administered 2021-09-01: 17 g via ORAL
  Filled 2021-08-30: qty 1

## 2021-08-30 MED ORDER — ACETAMINOPHEN 650 MG RE SUPP
650.0000 mg | Freq: Four times a day (QID) | RECTAL | Status: DC | PRN
Start: 2021-08-30 — End: 2021-09-01

## 2021-08-30 MED ORDER — INSULIN ASPART 100 UNIT/ML IJ SOLN
0.0000 [IU] | Freq: Three times a day (TID) | INTRAMUSCULAR | Status: DC
  Administered 2021-08-31: 3 [IU] via SUBCUTANEOUS
  Administered 2021-08-31: 1 [IU] via SUBCUTANEOUS
  Administered 2021-09-01: 3 [IU] via SUBCUTANEOUS
  Administered 2021-09-01: 1 [IU] via SUBCUTANEOUS

## 2021-08-30 MED ORDER — ONDANSETRON HCL 4 MG/2ML IJ SOLN: 4.0000 mg | Freq: Four times a day (QID) | INTRAMUSCULAR | Status: DC | PRN

## 2021-08-30 MED ORDER — ACETAMINOPHEN 325 MG PO TABS: 650.0000 mg | ORAL_TABLET | Freq: Four times a day (QID) | ORAL | Status: DC | PRN

## 2021-08-30 MED ORDER — SODIUM CHLORIDE 0.9% FLUSH
3.0000 mL | Freq: Two times a day (BID) | INTRAVENOUS | Status: DC
  Administered 2021-08-30 – 2021-08-31 (×3): 3 mL via INTRAVENOUS

## 2021-08-30 MED ORDER — ALBUTEROL SULFATE (2.5 MG/3ML) 0.083% IN NEBU: 3.0000 mL | INHALATION_SOLUTION | RESPIRATORY_TRACT | Status: DC | PRN

## 2021-08-30 MED ORDER — GABAPENTIN 100 MG PO CAPS
100.0000 mg | ORAL_CAPSULE | Freq: Every day | ORAL | Status: DC
  Administered 2021-08-30 – 2021-08-31 (×2): 100 mg via ORAL
  Filled 2021-08-30 (×2): qty 1

## 2021-08-30 MED ORDER — PANTOPRAZOLE SODIUM 40 MG PO TBEC
40.0000 mg | DELAYED_RELEASE_TABLET | Freq: Two times a day (BID) | ORAL | Status: DC
  Administered 2021-08-30 – 2021-09-01 (×4): 40 mg via ORAL
  Filled 2021-08-30 (×4): qty 1

## 2021-08-30 MED ORDER — SODIUM CHLORIDE 0.9% FLUSH
3.0000 mL | Freq: Two times a day (BID) | INTRAVENOUS | Status: DC
  Administered 2021-08-30 – 2021-09-01 (×3): 3 mL via INTRAVENOUS

## 2021-08-30 MED ORDER — VITAMIN D 25 MCG (1000 UNIT) PO TABS
4000.0000 [IU] | ORAL_TABLET | Freq: Two times a day (BID) | ORAL | Status: DC
  Administered 2021-08-30 – 2021-09-01 (×4): 4000 [IU] via ORAL
  Filled 2021-08-30 (×4): qty 4

## 2021-08-30 MED ORDER — SODIUM CHLORIDE 0.9% FLUSH: 3.0000 mL | INTRAVENOUS | Status: DC | PRN

## 2021-08-30 MED ORDER — BENZONATATE 100 MG PO CAPS
100.0000 mg | ORAL_CAPSULE | Freq: Three times a day (TID) | ORAL | Status: DC | PRN
  Administered 2021-08-30 – 2021-08-31 (×2): 100 mg via ORAL
  Filled 2021-08-30 (×2): qty 1

## 2021-08-30 MED ORDER — INSULIN ASPART 100 UNIT/ML IJ SOLN: 0.0000 [IU] | Freq: Every day | INTRAMUSCULAR | Status: DC

## 2021-08-30 MED ORDER — SODIUM CHLORIDE 0.9 % IV SOLN
500.0000 mg | INTRAVENOUS | Status: DC
  Administered 2021-08-30 – 2021-09-01 (×3): 500 mg via INTRAVENOUS
  Filled 2021-08-30 (×3): qty 5

## 2021-08-30 MED ORDER — ATORVASTATIN CALCIUM 20 MG PO TABS
20.0000 mg | ORAL_TABLET | Freq: Every day | ORAL | Status: DC
  Administered 2021-08-30 – 2021-09-01 (×3): 20 mg via ORAL
  Filled 2021-08-30 (×3): qty 1

## 2021-08-30 MED ORDER — ONDANSETRON HCL 4 MG PO TABS: 4.0000 mg | ORAL_TABLET | Freq: Four times a day (QID) | ORAL | Status: DC | PRN

## 2021-08-30 MED ORDER — SODIUM CHLORIDE 0.9 % IV SOLN
250.0000 mL | INTRAVENOUS | Status: DC | PRN
  Administered 2021-08-30: 250 mL via INTRAVENOUS

## 2021-08-30 MED ORDER — TRAZODONE HCL 50 MG PO TABS
50.0000 mg | ORAL_TABLET | Freq: Every evening | ORAL | Status: DC | PRN
  Administered 2021-08-30 – 2021-08-31 (×2): 50 mg via ORAL
  Filled 2021-08-30 (×2): qty 1

## 2021-08-30 MED ORDER — CALCIUM CARBONATE 1250 (500 CA) MG PO TABS
1.0000 | ORAL_TABLET | Freq: Every day | ORAL | Status: DC
  Administered 2021-08-31 – 2021-09-01 (×2): 500 mg via ORAL
  Filled 2021-08-30 (×2): qty 1

## 2021-08-30 MED ORDER — ALPRAZOLAM 0.5 MG PO TABS
0.5000 mg | ORAL_TABLET | Freq: Every evening | ORAL | Status: DC | PRN
  Administered 2021-08-30 – 2021-08-31 (×2): 0.5 mg via ORAL
  Filled 2021-08-30 (×2): qty 1

## 2021-08-30 NOTE — Progress Notes (Signed)
No pulse ox cords available for tele box per charge nurse. ?

## 2021-08-30 NOTE — Progress Notes (Signed)
Patient Name  ?[name] Legal Sex  ?Male DOB  ?[birth year] SSN  ?[id number] Address  ?[place] [address] Phone  ?[phone] (Home) *Preferred*  ?[phone] [facility])  ? ? Message ?Received: 2 days ago ?Carolynn Comment M ?Please schedule US-BX   ?  ?   ?Previous Messages ?  ?----- Message -----  ?From: Corrie Mckusick, DO  ?Sent: 08/24/2021   8:49 AM EDT  ?To: Derek Jack, MD, Joanell Rising, *  ? ?Good Day.    ? ?Can we please set up Mr. Lax for outpatient biopsy next week?  ? ?US guided right supraclavicular node.  [facility] or [facility]  ? ?He will need standard hold on oral anticoagulation.  ? ?Thank you.  ?Corrie Mckusick  ?

## 2021-08-30 NOTE — Progress Notes (Signed)
Patient Name  ?[name] O Legal Sex  ?Male DOB  ?05/29/1931 SSN  ?[id number] Address  ?[place] [address] Phone  ?[phone] (Home) *Preferred*  ?[phone] [facility])  ? ? Message ?Received: Today ?Manuella Ghazi, Pratik D, DO  Moshe Cipro, Oswald Hillock, MD ?[facility] to hold 2 days prior to bx.   ?  ?   ?Previous Messages ?  ?----- Message -----  ?From: Garth Bigness D  ?Sent: 08/30/2021   9:23 AM EDT  ?To: Derek Jack, MD, [place], DO  ? ?Good morning, Mr Reinig is on Eliquis and will need to hold for 2 days prior to biopsy. Please advise if okay to hold. Thanks Aniceto Boss  ?----- Message -----  ?From: Anell Barr  ?Sent: 08/28/2021   7:49 AM EDT  ?To: Marnee Guarneri  ? ?Please schedule US-BX  ? ?----- Message -----  ?From: Corrie Mckusick, DO  ?Sent: 08/24/2021   8:49 AM EDT  ?To: Derek Jack, MD, Joanell Rising, *  ? ?Good Day.    ? ?Can we please set up Mr. Lapaglia for outpatient biopsy next week?  ? ?US guided right supraclavicular node.  [facility] or [facility]  ? ?He will need standard hold on oral anticoagulation.  ? ?Thank you.  ?Corrie Mckusick ?

## 2021-08-30 NOTE — ED Provider Notes (Addendum)
?[facility] ?Provider Note ? ? ?CSN: [id number] ?Arrival date & time: 08/30/21  1058 ? ?  ? ?History ? ?Chief Complaint  ?Patient presents with  ? Shortness of Breath  ? ? ?[name] is a 88 y.o. male. ? ?Patient presents with chief complaint of shortness of breath.  Symptoms ongoing for the past 2 to 3 days.  He states that he having generalized fatigue generalized weakness and decreased appetite.  O2 saturation ranging from 80s to 90% on room air which is unusual for him.  Denies any fevers, has had a cough for about a week.  No vomiting or diarrhea.  Denies any headache or chest pain or abdominal pain. ? ? ?  ? ?Home Medications ?Prior to Admission medications   ?Medication Sig Start Date End Date Taking? Authorizing Provider  ?acetaminophen (TYLENOL) 500 MG tablet Take 500 mg by mouth every 6 (six) hours as needed for moderate pain, mild pain or headache.   Yes [provider]  ?ALPRAZolam Duanne Moron) 0.5 MG tablet Take 0.5 mg by mouth at bedtime as needed for anxiety. 10/12/20  Yes [provider]  ?apixaban (ELIQUIS) 5 MG TABS tablet Take 1 tablet (5 mg total) by mouth 2 (two) times daily. 08/24/21 09/23/21 Yes Shah, Pratik D, DO  ?atorvastatin (LIPITOR) 20 MG tablet Take 1 tablet (20 mg total) by mouth daily. 08/25/21 09/24/21 Yes Shah, Pratik D, DO  ?benzonatate (TESSALON) 100 MG capsule Take 100 mg by mouth 3 (three) times daily as needed for cough.   Yes [provider]  ?calcium carbonate (OS-CAL - DOSED IN MG OF ELEMENTAL CALCIUM) 1250 (500 Ca) MG tablet Take 1 tablet (500 mg of elemental calcium total) by mouth daily with breakfast. 04/19/16  Yes Kefalas, Manon Hilding, PA-C  ?Carboxymethylcellulose Sodium 0.25 % SOLN Place 1 drop into both eyes daily as needed (Dry eye).   Yes [provider]  ?Cholecalciferol (VITAMIN D) 2000 UNITS CAPS Take 4,000 Units by mouth 2 (two) times daily.   Yes [provider]  ?cyanocobalamin (,VITAMIN B-12,) 1000  MCG/ML injection Inject 1 mL (1,000 mcg total) into the muscle every 30 (thirty) days. 04/12/20  Yes Derek Jack, MD  ?diltiazem (CARDIZEM CD) 120 MG 24 hr capsule Take 1 capsule (120 mg total) by mouth daily. 08/24/21 09/23/21 Yes Shah, Pratik D, DO  ?gabapentin (NEURONTIN) 100 MG capsule Take 100 mg by mouth at bedtime.   Yes [provider]  ?glipiZIDE (GLUCOTROL) 5 MG tablet Take 1 tablet (5 mg total) by mouth 2 (two) times daily before a meal. ?Patient taking differently: Take 2.5 mg by mouth 2 (two) times daily before a meal. 01/05/16  Yes Gold, Wayne E, PA-C  ?lidocaine-prilocaine (EMLA) cream Apply a quarter size amount to port site 1 hour prior to chemo. Do not rub in. Cover with plastic wrap. ?Patient taking differently: Apply 1 application. topically See admin instructions. Apply a quarter size amount to port site 1 hour prior to chemo. Do not rub in. Cover with plastic wrap. 02/01/16  Yes Penland, Kelby Fam, MD  ?loratadine (CLARITIN) 10 MG tablet Take 10 mg by mouth daily as needed for allergies.   Yes [provider]  ?Meclizine HCl 25 MG CHEW Chew 25 mg by mouth 3 (three) times daily. 09/16/20  Yes [provider]  ?pantoprazole (PROTONIX) 40 MG tablet Take 40 mg by mouth 2 (two) times daily.    Yes [provider]  ?terazosin (HYTRIN) 5 MG capsule Take  5 mg by mouth at bedtime.   Yes [provider]  ?[place] test strip USE 5 STRIPS DAILY TO MONITOR FLUCTUATING BLOOD SUGAR 11/13/17   [provider]  ?ACCU-CHEK FASTCLIX LANCETS MISC USE 6 LANCETS DAILY 11/13/17   [provider]  ?   ? ?Allergies    ?Morphine and related, Amitriptyline, Omeprazole, Temazepam, and Zantac [ranitidine]   ? ?Review of Systems   ?Review of Systems  ?Constitutional:  Negative for fever.  ?HENT:  Negative for ear pain and sore throat.   ?Eyes:  Negative for pain.  ?Respiratory:  Positive for shortness of breath.   ?Cardiovascular:  Negative for chest  pain.  ?Gastrointestinal:  Negative for abdominal pain.  ?Genitourinary:  Negative for flank pain.  ?Musculoskeletal:  Negative for back pain.  ?Skin:  Negative for color change and rash.  ?Neurological:  Negative for syncope.  ?All other systems reviewed and are negative. ? ?Physical Exam ?Updated Vital Signs ?BP (!) 126/58   Pulse 88   Temp 97.7 ?F (36.5 ?C) (Oral)   Resp 20   Ht 5\' 10"  (1.778 m)   Wt 91.9 kg   SpO2 95%   BMI 29.07 kg/m?  ?Physical Exam ?Constitutional:   ?   Appearance: He is well-developed.  ?HENT:  ?   Head: Normocephalic.  ?   Nose: Nose normal.  ?Eyes:  ?   Extraocular Movements: Extraocular movements intact.  ?Cardiovascular:  ?   Rate and Rhythm: Normal rate.  ?Pulmonary:  ?   Effort: Tachypnea present.  ?   Breath sounds: Decreased breath sounds and wheezing present.  ?Skin: ?   Coloration: Skin is not jaundiced.  ?Neurological:  ?   Mental Status: He is alert. Mental status is at baseline.  ? ? ?ED Results / Procedures / Treatments   ?Labs ?(all labs ordered are listed, but only abnormal results are displayed) ?Labs Reviewed  ?CBC WITH DIFFERENTIAL/PLATELET - Abnormal; Notable for the following components:  ?    Result Value  ? WBC 10.7 (*)   ? RBC 4.19 (*)   ? Hemoglobin 12.6 (*)   ? HCT 38.4 (*)   ? Neutro Abs 8.3 (*)   ? All other components within normal limits  ?COMPREHENSIVE METABOLIC PANEL - Abnormal; Notable for the following components:  ? Glucose, Bld 187 (*)   ? Calcium 8.4 (*)   ? Total Protein 6.1 (*)   ? Albumin 2.9 (*)   ? AST 47 (*)   ? All other components within normal limits  ?CULTURE, BLOOD (ROUTINE X 2)  ?CULTURE, BLOOD (ROUTINE X 2)  ?RESP PANEL BY RT-PCR (FLU A&B, COVID) [id number]  ?LACTIC ACID, PLASMA  ? ? ?EKG ?None ? ?Radiology ?DG Chest Port 1 View ? ?Result Date: 08/30/2021 ?CLINICAL DATA:  Shortness of breath and decreased O2 sats today. History of lung cancer, former smoker. EXAM: PORTABLE CHEST 1 VIEW COMPARISON:  Prior chest radiographs 08/22/2021 and  earlier. PET-CT 08/10/2021. FINDINGS: Right chest infusion port catheter with tip projecting at the level of the SVC. Heart size within normal limits. Large right pleural effusion, increased as compared to the prior chest radiograph of 08/22/2021. Additional opacity within the right lung, compatible with atelectasis and possibly airspace consolidation. 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 No appreciable airspace consolidation within the left lung. No evidence of pneumothorax. No acute bony abnormality identified. IMPRESSION: Large  right pleural effusion, significantly increased from the prior chest radiograph of 08/22/2021. Additional opacity within the right lung, compatible with underlying atelectasis and possibly airspace consolidation. 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 Aortic Atherosclerosis (ICD10-I70.0). Electronically Signed   By: Kellie Simmering D.O.   On: 08/30/2021 11:59   ? ?Procedures ?Marland KitchenCritical Care ?Performed by: Luna Fuse, MD ?Authorized by: Luna Fuse, MD  ? ?Critical care provider statement:  ?  Critical care time (minutes):  30 ?  Critical care time was exclusive of:  Separately billable procedures and treating other patients and teaching time ?  Critical care was necessary to treat or prevent imminent or life-threatening deterioration of the following conditions:  Respiratory failure  ? ? ?Medications Ordered in ED ?Medications  ?albuterol (PROVENTIL) (2.5 MG/3ML) 0.083% nebulizer solution 3 mL (has no administration in time range)  ?cefTRIAXone (ROCEPHIN) 1 g in sodium chloride 0.9 % 100 mL IVPB (has no administration in time range)  ?azithromycin (ZITHROMAX) 500 mg in sodium chloride 0.9 % 250 mL IVPB (has no administration in time range)  ? ? ?ED Course/  Medical Decision Making/ A&P ?  ?                        ?Medical Decision Making ?Amount and/or Complexity of Data Reviewed ?Labs: ordered. ? ?Risk ?Prescription drug management. ?Decision regarding hospitalization.

## 2021-08-30 NOTE — ED Notes (Signed)
Upon assessment pt O2 sats 91%. Patient placed on 2L [place]. ?

## 2021-08-30 NOTE — ED Triage Notes (Signed)
Wife reports pt was recently diagnosed with stage 4 cancer, was recently admitted and since discharge pt has shortness of breath, weakness, lethargic, no appetite, not eating or drinking, reports O2 sats at home range from 82-92, not on home O2, 92% in triage  ?

## 2021-08-30 NOTE — Procedures (Signed)
PROCEDURE SUMMARY: ? ?Successful ultrasound guided right thoracentesis. ? ?Yielded 2L of bloody fluid.  ?No immediate complications.  ?The patient tolerated the procedure well.  ? ?Specimen sent for labs. ? ?EBL < 33mL ? ?Fabiola Backer, MD ? ? ?

## 2021-08-30 NOTE — ED Notes (Signed)
Patient transported to Ultrasound 

## 2021-08-30 NOTE — Progress Notes (Signed)
PT tolerated right sided thoracentesis procedure well today and 2 Liters of dark red fluid removed with labs collected and sent for processing. PT taken to for chest xray post procedure and was read by radiologist before being transported via stretcher back to ED room assignment. PT and wife at bedside verbalized understanding of post procedure instructions. No acute distress noted at this time. ?

## 2021-08-30 NOTE — H&P (Addendum)
?                                                                                           ? ? ? ? Patient Demographics:  ? ? ?[name], is a 88 y.o. male  MRN: [id number]   DOB - [birth year] ? ?Admit Date - 08/30/2021 ? ?Outpatient Primary MD for the patient is Monico Blitz, MD ? ? Assessment & Plan:  ? ?Assessment and Plan: ? ? ? ?1)Presumed stage IV metastatic right lung cancer----status post successful right-sided ultrasound-guided thoracentesis with removal of 2 L of bloody fluid on 08/30/2021 ?-Awaiting cytology and other studies ?-Patient and wife are not sure they want to pursue official biopsy if cytology did not reveal etiology ?-They are considering possible hospice rather than further work-up and oncology treatments ?-To let us know the final decision ?-Patient status and hypoxia improved significantly after thoracentesis ?-Continue bronchodilators and antibiotics as prescribed ?-prior right lung adenocarcinoma with lobectomy in  June 2017 followed by chemo and Radiotherapy at that time, followed by immunotherapy which she completed in March 2019 ?-recent PET CT scan images with the patient.  It showed bilateral supraclavicular lymph nodes, mediastinal lymph nodes, pleural disease on the right side and liver disease. ? ?2)PAFib--continue Eliquis for stroke prophylaxis and Cardizem for rate control ? ?3)DM2-A1c 7.4 reflecting uncontrolled DM ?Use Novolog/Humalog Sliding scale insulin with Accu-Cheks/Fingersticks as ordered  ? ?4)BPH--continue Hytrin nightly ? ?5) social/ethics--- get palliative care consult further discussed goals of care and advanced directives ? ?6)acute hypoxic respiratory failure secondary to right-sided pleural effusion presumed malignant requiring thoracentesis and supplemental oxygen--- please see #1 above ? ?Disposition/Need for in-Hospital Stay- patient unable to be discharged at this time due to -acute  hypoxic respiratory failure secondary to right-sided pleural effusion presumed malignant requiring thoracentesis and supplemental oxygen ? ?Status is: Inpatient ? ? ?Dispo: The patient is from: Home ?             Anticipated d/c is to: Home ?             Anticipated d/c date is: 2 days ?             Patient currently is not medically stable to d/c. ?Barriers: Not Clinically Stable-  ? ? ?With History of - ?Reviewed by me ? ?Past Medical History:  ?Diagnosis Date  ? Adenocarcinoma of lung, stage 3 ([place])   ? Stage IIIA  ? Adenocarcinoma of right lung, stage 3 ([place]) 12/02/2015  ? Arthritis   ? Atrial fibrillation, transient ([place])   ? transient postop, < 24 hours  ? B12 deficiency   ? Cyst of scrotum   ? Diverticulitis   ? GERD (gastroesophageal reflux disease)   ? Hernia   ? History of pneumonia   ? Hyperlipidemia   ? Lung nodule   ? Spiculated right middle lobe nodule, hypermetabolic  ? Stroke [facility])   ? Type 2 diabetes mellitus ([place])   ?   ? ?Past Surgical History:  ?Procedure Laterality Date  ? BACK SURGERY    ? BIOPSY  12/30/2020  ? Procedure: BIOPSY;  Surgeon: Montez Morita,  Quillian Quince, MD;  [facility];  Service: Gastroenterology;;  ? CHOLECYSTECTOMY    ? COLONOSCOPY N/A 11/24/2015  ? Procedure: COLONOSCOPY;  Surgeon: Rogene Houston, MD;  [facility];  Service: Endoscopy;  Laterality: N/A;  730  ? COLONOSCOPY W/ POLYPECTOMY    ? x 5  ? COLONOSCOPY WITH PROPOFOL N/A 12/30/2020  ? Procedure: COLONOSCOPY WITH PROPOFOL;  Surgeon: Harvel Quale, MD;  [facility];  Service: Gastroenterology;  Laterality: N/A;  12:15  ? CYST EXCISION    ? Scrotum  ? ESOPHAGEAL DILATION N/A 12/30/2020  ? Procedure: ESOPHAGEAL DILATION;  Surgeon: Montez Morita, Quillian Quince, MD;  [facility];  Service: Gastroenterology;  Laterality: N/A;  ? ESOPHAGOGASTRODUODENOSCOPY (EGD) WITH PROPOFOL N/A 12/30/2020  ? Procedure: ESOPHAGOGASTRODUODENOSCOPY (EGD) WITH PROPOFOL;  Surgeon: Harvel Quale, MD;  [facility];  Service: Gastroenterology;  Laterality: N/A;  ? EYE SURGERY Bilateral   ? Cataract with Lens  ? HERNIA REPAIR Right   ? Inguinal  ? IR GENERIC HISTORICAL  02/09/2016  ? IR US GUIDE VASC ACCESS RIGHT 02/09/2016 Arne Cleveland, MD [facility]  ? IR GENERIC HISTORICAL  02/09/2016  ? IR FLUORO GUIDE CV LINE RIGHT 02/09/2016 Arne Cleveland, MD [facility]  ? LUMBAR DISC SURGERY    ? per patient report  ? POLYPECTOMY  12/30/2020  ? Procedure: POLYPECTOMY;  Surgeon: Harvel Quale, MD;  [facility];  Service: Gastroenterology;;  ? PORTACATH PLACEMENT    ? VIDEO ASSISTED THORACOSCOPY (VATS)/ LOBECTOMY Right 12/30/2015  ? Procedure: VIDEO ASSISTED THORACOSCOPY (VATS)/RIGHT MIDDLE LOBECTOMY;  Surgeon: Melrose Nakayama, MD;  Location: [place];  Service: Thoracic;  Laterality: Right;  ? ? ? ? ?Chief Complaint  ?Patient presents with  ? Shortness of Breath  ?  ? ? HPI:  ? ? [name]  is a 88 y.o. male who is a reformed smoker with past medical history relevant for prior right lung adenocarcinoma with lobectomy in  June 2017 followed by chemo and Radiotherapy at that time as well as history of paroxysmal atrial fibrillation, currently on anticoagulation DM2, HLD and HTN who presents to the ED with concerns about worsening dyspnea and is found to be transiently hypoxic in the ED ?- ?Patient was recently admitted to this hospital from 08/22/2021 thru  08/24/21 with syncope and A-fib ?- ?Additional history obtained from patient's wife at bedside ?-Patient apparently has had increasing weakness poor appetite and poor oral intake due to worsening dyspnea ?- ?In the ED chest x-ray showed large right-sided pleural effusion ?-Patient underwent right-sided ultrasound-guided thoracentesis on 08/30/2021 with removal of 2 L of bloody fluid--- cytology and other studies are pending ?-CBC with white count of 10.7 hemoglobin is 12.6 ?-Chemistry with a creatinine of 1.0  potassium 4.3 ?-COVID and flu negative ? Review of systems:  ?  ?In addition to the HPI above,  ? ?A full Review of  Systems was done, all other systems reviewed are negative except as noted above in HPI , . ? ? ? Social History:  ?Reviewed by me ? ?  ?Social History  ? ?Tobacco Use  ? Smoking status: Former  ?  Types: Cigarettes  ?  Quit date: 05/27/2004  ?  Years since quitting: 17.2  ? Smokeless tobacco: Never  ?Substance Use Topics  ? Alcohol use: No  ?  Alcohol/week: 0.0 standard drinks  ? ? ? ? ? Family History :  ?Reviewed by me ? ?  ?  Family History  ?Problem Relation Age of Onset  ? Colon cancer Brother   ? ? ? Home Medications:  ? ?Prior to Admission medications   ?Medication Sig Start Date End Date Taking? Authorizing Provider  ?acetaminophen (TYLENOL) 500 MG tablet Take 500 mg by mouth every 6 (six) hours as needed for moderate pain, mild pain or headache.   Yes [provider]  ?ALPRAZolam Duanne Moron) 0.5 MG tablet Take 0.5 mg by mouth at bedtime as needed for anxiety. 10/12/20  Yes [provider]  ?apixaban (ELIQUIS) 5 MG TABS tablet Take 1 tablet (5 mg total) by mouth 2 (two) times daily. 08/24/21 09/23/21 Yes Shah, Pratik D, DO  ?atorvastatin (LIPITOR) 20 MG tablet Take 1 tablet (20 mg total) by mouth daily. 08/25/21 09/24/21 Yes Shah, Pratik D, DO  ?benzonatate (TESSALON) 100 MG capsule Take 100 mg by mouth 3 (three) times daily as needed for cough.   Yes [provider]  ?calcium carbonate (OS-CAL - DOSED IN MG OF ELEMENTAL CALCIUM) 1250 (500 Ca) MG tablet Take 1 tablet (500 mg of elemental calcium total) by mouth daily with breakfast. 04/19/16  Yes Kefalas, Manon Hilding, PA-C  ?Carboxymethylcellulose Sodium 0.25 % SOLN Place 1 drop into both eyes daily as needed (Dry eye).   Yes [provider]  ?Cholecalciferol (VITAMIN D) 2000 UNITS CAPS Take 4,000 Units by mouth 2 (two) times daily.   Yes [provider]  ?cyanocobalamin (,VITAMIN B-12,) 1000 MCG/ML injection  Inject 1 mL (1,000 mcg total) into the muscle every 30 (thirty) days. 04/12/20  Yes Derek Jack, MD  ?diltiazem (CARDIZEM CD) 120 MG 24 hr capsule Take 1 capsule (120 mg total) by mouth daily. 08/24/21 4/15

## 2021-08-30 NOTE — Plan of Care (Signed)

## 2021-08-31 ENCOUNTER — Encounter (HOSPITAL_COMMUNITY): Payer: Self-pay | Admitting: Family Medicine

## 2021-08-31 ENCOUNTER — Inpatient Hospital Stay (HOSPITAL_COMMUNITY): Payer: Medicare Other

## 2021-08-31 DIAGNOSIS — Z515 Encounter for palliative care: Secondary | ICD-10-CM | POA: Diagnosis not present

## 2021-08-31 DIAGNOSIS — Z7189 Other specified counseling: Secondary | ICD-10-CM | POA: Diagnosis not present

## 2021-08-31 DIAGNOSIS — C3490 Malignant neoplasm of unspecified part of unspecified bronchus or lung: Secondary | ICD-10-CM | POA: Diagnosis not present

## 2021-08-31 DIAGNOSIS — J9601 Acute respiratory failure with hypoxia: Secondary | ICD-10-CM | POA: Diagnosis not present

## 2021-08-31 LAB — BASIC METABOLIC PANEL
Anion gap: 9 (ref 5–15)
BUN: 13 mg/dL (ref 8–23)
CO2: 23 mmol/L (ref 22–32)
Calcium: 8.2 mg/dL — ABNORMAL LOW (ref 8.9–10.3)
Chloride: 103 mmol/L (ref 98–111)
Creatinine, Ser: 1.07 mg/dL (ref 0.61–1.24)
GFR, Estimated: >60 mL/min (ref >60)
Glucose, Bld: 173 mg/dL — ABNORMAL HIGH (ref 70–99)
Potassium: 4.4 mmol/L (ref 3.5–5.1)
Sodium: 135 mmol/L (ref 135–145)

## 2021-08-31 LAB — CBC
HCT: 34.8 % — ABNORMAL LOW (ref 39.0–52.0)
Hemoglobin: 11.4 g/dL — ABNORMAL LOW (ref 13.0–17.0)
MCH: 30.2 pg (ref 26.0–34.0)
MCHC: 32.8 g/dL (ref 30.0–36.0)
MCV: 92.3 fL (ref 80.0–100.0)
Platelets: 347 10*3/uL (ref 150–400)
RBC: 3.77 MIL/uL — ABNORMAL LOW (ref 4.22–5.81)
RDW: 13 % (ref 11.5–15.5)
WBC: 10.6 10*3/uL — ABNORMAL HIGH (ref 4.0–10.5)
nRBC: 0 % (ref 0.0–0.2)

## 2021-08-31 LAB — GLUCOSE, CAPILLARY
Glucose-Capillary: 183 mg/dL — ABNORMAL HIGH (ref 70–99)
Glucose-Capillary: 275 mg/dL — ABNORMAL HIGH (ref 70–99)
Glucose-Capillary: 143 mg/dL — ABNORMAL HIGH (ref 70–99)
Glucose-Capillary: 168 mg/dL — ABNORMAL HIGH (ref 70–99)

## 2021-08-31 LAB — AMYLASE, BODY FLUID (OTHER): Amylase, Body Fluid: 50 U/L

## 2021-08-31 MED ORDER — GUAIFENESIN-DM 100-10 MG/5ML PO SYRP
5.0000 mL | ORAL_SOLUTION | Freq: Once | ORAL | Status: AC
  Administered 2021-08-31: 5 mL via ORAL
  Filled 2021-08-31: qty 5

## 2021-08-31 MED ORDER — SODIUM CHLORIDE 0.9 % IV SOLN
2.0000 g | INTRAVENOUS | Status: DC
  Administered 2021-08-31 – 2021-09-01 (×2): 2 g via INTRAVENOUS
  Filled 2021-08-31 (×2): qty 20

## 2021-08-31 NOTE — Progress Notes (Addendum)
?PROGRESS NOTE ? ? ? ? ?[name], is a 88 y.o. male, DOB - 06/13/1930, VZD:[id number] ? ?Admit date - 08/30/2021   Admitting Physician Terril Chestnut Denton Brick, MD ? ?Outpatient Primary MD for the patient is Monico Blitz, MD ? ?LOS - 1 ? ?Chief Complaint  ?Patient presents with  ? Shortness of Breath  ?    ? ? ?Brief Narrative:  ? ?88 y.o. male who is a reformed smoker with past medical history relevant for prior right lung adenocarcinoma with lobectomy in  June 2017 followed by chemo and Radiotherapy at that time as well as history of paroxysmal atrial fibrillation, currently on anticoagulation DM2, HLD and HTN admitted on 08/30/2021 with acute hypoxic respiratory failure in the setting of recurrent lung malignancy with presumed malignant effusion ?- ? ?  ?-Assessment and Plan: ? ?1)Presumed stage IV metastatic right lung cancer/possible postobstructive pneumonia----status post successful right-sided ultrasound-guided thoracentesis with removal of 2 L of bloody fluid on 08/30/2021 ?-Awaiting cytology and other studies ?-Patient and wife state that they will Not want to pursue official biopsy if cytology did not reveal etiology ?-They are considering possible hospice rather than further work-up and oncology treatments ?-Patient status and hypoxia improved significantly after thoracentesis ?-Continue bronchodilators and antibiotics as prescribed ?-prior right lung adenocarcinoma with lobectomy in  June 2017 followed by chemo and Radiotherapy at that time, followed by immunotherapy which she completed in March 2019 ?-recent PET CT scan images with the patient.  It showed bilateral supraclavicular lymph nodes, mediastinal lymph nodes, pleural disease on the right side and liver disease. ?-Repeat chest x-[name] on 08/31/2021 with consolidation continue IV Rocephin/azithromycin for possible postobstructive pneumonia ?  ?2)PAFib--continue Eliquis for stroke prophylaxis and Cardizem for rate control ?  ?3)DM2-A1c 7.4 reflecting  uncontrolled DM ?Use Novolog/Humalog Sliding scale insulin with Accu-Cheks/Fingersticks as ordered  ?  ?4)BPH--continue Hytrin nightly ?  ?5) social/ethics---AWaiting for the conversations with palliative care/hospice team about goals of care and advanced directives ?-  ?6)acute hypoxic respiratory failure secondary to right-sided pleural effusion presumed malignant requiring thoracentesis and supplemental oxygen--- please see #1 above ?  ?Disposition/Need for in-Hospital Stay- patient unable to be discharged at this time due to -acute hypoxic respiratory failure secondary to right-sided pleural effusion presumed malignant requiring thoracentesis and supplemental oxygen ?  ?Status is: Inpatient ?   ?Dispo: The patient is from: Home ?             Anticipated d/c is to: Home with hospice Versus residential hospice ?             Anticipated d/c date is: 1 days ?             Patient currently is not medically stable to d/c. ?Barriers: Not Clinically Stable-  ? ?Code Status : -  Code Status: Full Code  ? ?Family Communication:    Discussed with wife at bedside ? ?DVT Prophylaxis  :   - SCDs  SCDs Start: 08/30/21 1614 ?Place TED hose Start: 08/30/21 1614 ?apixaban (ELIQUIS) tablet 5 mg  ? ?Lab Results  ?Component Value Date  ? PLT 347 08/31/2021  ? ? ?Inpatient Medications ? ?Scheduled Meds: ? apixaban  5 mg Oral BID  ? atorvastatin  20 mg Oral Daily  ? calcium carbonate  1 tablet Oral Q breakfast  ? cholecalciferol  4,000 Units Oral BID  ? diltiazem  120 mg Oral Daily  ? gabapentin  100 mg Oral QHS  ? insulin aspart  0-5 Units Subcutaneous QHS  ?  insulin aspart  0-6 Units Subcutaneous TID WC  ? pantoprazole  40 mg Oral BID  ? sodium chloride flush  3 mL Intravenous Q12H  ? sodium chloride flush  3 mL Intravenous Q12H  ? terazosin  5 mg Oral QHS  ? ?Continuous Infusions: ? sodium chloride 250 mL (08/30/21 1644)  ? azithromycin 500 mg (08/30/21 1648)  ? cefTRIAXone (ROCEPHIN)  IV    ? ?PRN Meds:.sodium chloride,  acetaminophen **OR** acetaminophen, albuterol, ALPRAZolam, benzonatate, bisacodyl, loratadine, ondansetron **OR** ondansetron (ZOFRAN) IV, polyethylene glycol, sodium chloride flush, traZODone ? ? ?Anti-infectives (From admission, onward)  ? ? Start     Dose/Rate Route Frequency Ordered Stop  ? 08/31/21 1400  cefTRIAXone (ROCEPHIN) 2 g in sodium chloride 0.9 % 100 mL IVPB       ? 2 g ?200 mL/hr over 30 Minutes Intravenous Every 24 hours 08/31/21 0838    ? 08/30/21 1415  cefTRIAXone (ROCEPHIN) 1 g in sodium chloride 0.9 % 100 mL IVPB  Status:  Discontinued       ? 1 g ?200 mL/hr over 30 Minutes Intravenous Every 24 hours 08/30/21 1414 08/31/21 0838  ? 08/30/21 1415  azithromycin (ZITHROMAX) 500 mg in sodium chloride 0.9 % 250 mL IVPB       ? 500 mg ?250 mL/hr over 60 Minutes Intravenous Every 24 hours 08/30/21 1414    ? ?  ? ?  ? ?Subjective: ?[name] today has no fevers, no emesis,  No chest pain,   ?- ?-Cough and dyspnea persist ?-Occasional episodes of confusion ? ? ?Objective: ?Vitals:  ? 08/30/21 1553 08/30/21 2059 08/31/21 0059 08/31/21 0314  ?BP: (!) 154/59 (!) 141/63 (!) 128/56 (!) 122/52  ?Pulse: (!) 48 95 (!) 103 (!) 109  ?Resp: 18 16 15 20   ?Temp: 97.6 ?F (36.4 ?C) 97.8 ?F (36.6 ?C) 98 ?F (36.7 ?C) 97.9 ?F (36.6 ?C)  ?TempSrc: Oral   Oral  ?SpO2: 94% 92% 90% 91%  ?Weight: 90.7 kg     ?Height: 6' (1.829 m)     ? ? ?Intake/Output Summary (Last 24 hours) at 08/31/2021 1136 ?Last data filed at 08/31/2021 0900 ?Gross per 24 hour  ?Intake 937.99 ml  ?Output 200 ml  ?Net 737.99 ml  ? ?Filed Weights  ? 08/30/21 1113 08/30/21 1553  ?Weight: 91.9 kg 90.7 kg  ? ? ?Physical Exam ? ?Physical Examination: General appearance - alert,  and in no distress  ?Mental status - alert, oriented to person, place, and time,  ?Eyes - sclera anicteric ?Nose-- [place] 2L/min ?Neck - supple, no JVD elevation , ?Chest - diminished on the right, few scattered rhonchi ?Heart - S1 and S2 normal, irregular , right-sided Port-A-Cath in  situ ?abdomen - soft, nontender, nondistended, no masses or organomegaly ?Neurological - screening mental status exam normal, neck supple without rigidity, cranial nerves II through XII intact, DTR's normal and symmetric ?Extremities - no pedal edema noted, intact peripheral pulses  ?Skin - warm, dry ? ?Data Reviewed: I have personally reviewed following labs and imaging studies ? ?CBC: ?Recent Labs  ?Lab 08/30/21 ?1128 08/31/21 ?0507  ?WBC 10.7* 10.6*  ?NEUTROABS 8.3*  --   ?HGB 12.6* 11.4*  ?HCT 38.4* 34.8*  ?MCV 91.6 92.3  ?PLT 367 347  ? ?Basic Metabolic Panel: ?Recent Labs  ?Lab 08/30/21 ?1128 08/31/21 ?0507  ?NA 136 135  ?K 4.3 4.4  ?CL 103 103  ?CO2 24 23  ?GLUCOSE 187* 173*  ?BUN 10 13  ?CREATININE 1.08 1.07  ?CALCIUM  8.4* 8.2*  ? ?GFR: ?Estimated Creatinine Clearance: 51.4 mL/min (by C-G formula based on SCr of 1.07 mg/dL). ?Liver Function Tests: ?Recent Labs  ?Lab 08/30/21 ?1128  ?AST 47*  ?ALT 25  ?ALKPHOS 119  ?BILITOT 0.6  ?PROT 6.1*  ?ALBUMIN 2.9*  ? ?Cardiac Enzymes: ?No results for input(s): CKTOTAL, CKMB, CKMBINDEX, TROPONINI in the last 168 hours. ?BNP (last 3 results) ?No results for input(s): PROBNP in the last 8760 hours. ?HbA1C: ?No results for input(s): HGBA1C in the last 72 hours. ?Sepsis Labs: ?@LABRCNTIP (procalcitonin:4,lacticidven:4) ?) ?Recent Results (from the past 240 hour(s))  ?Resp Panel by RT-PCR (Flu A&B, Covid) Nasopharyngeal Swab     Status: None  ? Collection Time: 08/22/21 12:49 PM  ? Specimen: Nasopharyngeal Swab; Nasopharyngeal(NP) swabs in vial transport medium  ?Result Value Ref Range Status  ? SARS Coronavirus 2 by RT PCR NEGATIVE NEGATIVE Final  ?  Comment: (NOTE) ?SARS-CoV-2 target nucleic acids are NOT DETECTED. ? ?The SARS-CoV-2 RNA is generally detectable in upper respiratory ?specimens during the acute phase of infection. The lowest ?concentration of SARS-CoV-2 viral copies this assay can detect is ?138 copies/mL. A negative result does not preclude  SARS-Cov-2 ?infection and should not be used as the sole basis for treatment or ?other patient management decisions. A negative result may occur with  ?improper specimen collection/handling, submission of specimen other ?than nasopharyngeal s

## 2021-08-31 NOTE — Progress Notes (Signed)
Patient and his wife requesting to speak with palliative care to discuss treatment options, Dr. Denton Brick made aware and order was placed. ?

## 2021-08-31 NOTE — Progress Notes (Signed)
Per Palliative Care NP, referral made to [facility].  ? ? ? ? ?Windel Keziah D, LCSW  ?

## 2021-08-31 NOTE — Consult Note (Signed)
? ?                                                                                ?Consultation Note ?Date: 08/31/2021  ? ?Patient Name: [name]  ?DOB: 20-Sep-1930  MRN: [id number]  Age / Sex: 88 y.o., male  ?PCP: Monico Blitz, MD ?Referring Physician: Roxan Hockey, MD ? ?Reason for Consultation: Establishing goals of care and Hospice Evaluation ? ?HPI/Patient Profile: 88 y.o. male  with past medical history of adenocarcinoma of the lung stage III in 2017, A-fib, GERD, diverticulitis, DM 2, stroke, admitted on 08/30/2021 with presumed stage IV metastatic right lung cancer status post successful thoracentesis with 2 L of bloody fluid removed on 08/30/2021.  ? ?Clinical Assessment and Goals of Care: ?I have reviewed medical records including EPIC notes, labs and imaging, received report from RN, assessed the patient and then met at the bedside along with wife of 77 years, Hassan Rowan, to discuss diagnosis prognosis, GOC, EOL wishes, disposition and options.  I introduced Palliative Medicine as specialized medical care for people living with serious illness. It focuses on providing relief from the symptoms and stress of a serious illness. The goal is to improve quality of life for both the patient and the family. ? ?We discussed a brief life review of the patient.  Mr. and Mrs. Olheiser have been married for 20 years.  Mr. Kalafut has children from his previous marriage, son Marlou Sa and daughter Guarisco.  ? ?We then focused on their current illness.  Hassan Rowan talks in detail about Mr. Scantlebury is acute decline over the last few weeks/months.  She shares that they have had detailed discussions about his goals and he he does not want to seek work-up for suspected cancer.  He would like to have comfort and dignity at end-of-life, requesting residential hospice.  Hassan Rowan shares that her brother-in-law had hospice care so they are experienced.  The natural disease trajectory and expectations at EOL were discussed. ? ?Advanced directives,  concepts specific to code status, were considered and discussed.  Patient and family elect to DNR.  Orders adjusted. ? ?Hospice services outpatient were explained and offered.  Hassan Rowan shares that she and her husband are experienced with residential hospice care.  They are requesting residential hospice at [facility] [place].  I shared that hospice will likely want to send one of their workers to evaluate Mr. Nocito. ? ?Discussed the importance of continued conversation with family and the medical providers regarding overall plan of care and treatment options, ensuring decisions are within the context of the patient?s values and GOCs. Questions and concerns were addressed. The family was encouraged to call with questions or concerns.  PMT will continue to support holistically. ? ?Conference with attending, bedside nursing staff and [facility] team related to patient condition, needs, [place] and idspositon.  ? ? ?HCPOA  ?HCPOA -wife of 64 years, Sher Hellinger. ?  ? ?SUMMARY OF RECOMMENDATIONS   ?Requesting comfort and dignity at end-of-life, residential hospice at [place]. ? ? ?Code Status/Advance Care Planning: ?DNR ? ?Symptom Management:  ?Per hospitalist, no additional needs at this time.  ? ?Palliative Prophylaxis:  ?Frequent Pain Assessment, Oral Care, and Turn  Reposition ? ?Additional Recommendations (Limitations, Scope, Preferences): ?Requesting comfort and dignity at end of life, residential hospice at [place].  ? ?Psycho-social/Spiritual:  ?Desire for further Chaplaincy support:no ?Additional Recommendations: Caregiving  Support/Resources and Education on Hospice ? ?Prognosis:  ?< 4 weeks or less expected, based on acute and chronic health issues, recurrent metastatic lung cancer, recent thora with 2 lt removed, patient and family desire for residential hospice care.   ? ?Discharge Planning:  requesting residential hospice at [place].    ? ?  ? ?Primary Diagnoses: ?Present on Admission: ?  Acute respiratory failure with hypoxia ([place]) ? Rt Lung Lung cancer -recurrent-stage IV ? Atrial fibrillation, transient ([place]) ? ? ?I have reviewed the medical record, interviewed the patient and family, and examined the patient. The following aspects are pertinent. ? ?Past Medical History:  ?Diagnosis Date  ? Adenocarcinoma of lung, stage 3 ([place])   ? Stage IIIA  ? Adenocarcinoma of right lung, stage 3 ([place]) 12/02/2015  ? Arthritis   ? Atrial fibrillation, transient ([place])   ? transient postop, < 24 hours  ? B12 deficiency   ? Cyst of scrotum   ? Diverticulitis   ? GERD (gastroesophageal reflux disease)   ? Hernia   ? History of pneumonia   ? Hyperlipidemia   ? Lung nodule   ? Spiculated right middle lobe nodule, hypermetabolic  ? Stroke [facility])   ? Type 2 diabetes mellitus ([place])   ? ?Social History  ? ?Socioeconomic History  ? Marital status: Married  ?  Spouse name: Not on file  ? Number of children: Not on file  ? Years of education: Not on file  ? Highest education level: Not on file  ?Occupational History  ? Not on file  ?Tobacco Use  ? Smoking status: Former  ?  Types: Cigarettes  ?  Quit date: 05/27/2004  ?  Years since quitting: 17.2  ? Smokeless tobacco: Never  ?Vaping Use  ? Vaping Use: Never used  ?Substance and Sexual Activity  ? Alcohol use: No  ?  Alcohol/week: 0.0 standard drinks  ? Drug use: No  ? Sexual activity: Not on file  ?  Comment: married  ?Other Topics Concern  ? Not on file  ?Social History Narrative  ? Not on file  ? ?Social Determinants of Health  ? ?Financial Resource Strain: Not on file  ?Food Insecurity: Not on file  ?Transportation Needs: Not on file  ?Physical Activity: Not on file  ?Stress: Not on file  ?Social Connections: Not on file  ? ?Family History  ?Problem Relation Age of Onset  ? Colon cancer Brother   ? ?Scheduled Meds: ? apixaban  5 mg Oral BID  ? atorvastatin  20 mg Oral Daily  ? calcium carbonate  1 tablet Oral Q breakfast  ? cholecalciferol  4,000 Units Oral BID  ?  diltiazem  120 mg Oral Daily  ? gabapentin  100 mg Oral QHS  ? insulin aspart  0-5 Units Subcutaneous QHS  ? insulin aspart  0-6 Units Subcutaneous TID WC  ? pantoprazole  40 mg Oral BID  ? sodium chloride flush  3 mL Intravenous Q12H  ? sodium chloride flush  3 mL Intravenous Q12H  ? terazosin  5 mg Oral QHS  ? ?Continuous Infusions: ? sodium chloride 250 mL (08/30/21 1644)  ? azithromycin 500 mg (08/30/21 1648)  ? cefTRIAXone (ROCEPHIN)  IV    ? ?PRN Meds:.sodium chloride, acetaminophen **OR** acetaminophen, albuterol, ALPRAZolam, benzonatate, bisacodyl, loratadine,  ondansetron **OR** ondansetron (ZOFRAN) IV, polyethylene glycol, sodium chloride flush, traZODone ?Medications Prior to Admission:  ?Prior to Admission medications   ?Medication Sig Start Date End Date Taking? Authorizing Provider  ?acetaminophen (TYLENOL) 500 MG tablet Take 500 mg by mouth every 6 (six) hours as needed for moderate pain, mild pain or headache.   Yes [provider]  ?ALPRAZolam Duanne Moron) 0.5 MG tablet Take 0.5 mg by mouth at bedtime as needed for anxiety. 10/12/20  Yes [provider]  ?apixaban (ELIQUIS) 5 MG TABS tablet Take 1 tablet (5 mg total) by mouth 2 (two) times daily. 08/24/21 09/23/21 Yes Shah, Pratik D, DO  ?atorvastatin (LIPITOR) 20 MG tablet Take 1 tablet (20 mg total) by mouth daily. 08/25/21 09/24/21 Yes Shah, Pratik D, DO  ?benzonatate (TESSALON) 100 MG capsule Take 100 mg by mouth 3 (three) times daily as needed for cough.   Yes [provider]  ?calcium carbonate (OS-CAL - DOSED IN MG OF ELEMENTAL CALCIUM) 1250 (500 Ca) MG tablet Take 1 tablet (500 mg of elemental calcium total) by mouth daily with breakfast. 04/19/16  Yes Kefalas, Manon Hilding, PA-C  ?Carboxymethylcellulose Sodium 0.25 % SOLN Place 1 drop into both eyes daily as needed (Dry eye).   Yes [provider]  ?Cholecalciferol (VITAMIN D) 2000 UNITS CAPS Take 4,000 Units by mouth 2 (two) times daily.   Yes [provider]   ?cyanocobalamin (,VITAMIN B-12,) 1000 MCG/ML injection Inject 1 mL (1,000 mcg total) into the muscle every 30 (thirty) days. 04/12/20  Yes Derek Jack, MD  ?diltiazem (CARDIZEM CD) 120 MG 24 hr capsule Ta

## 2021-09-01 ENCOUNTER — Inpatient Hospital Stay (HOSPITAL_COMMUNITY)
Admission: RE | Admit: 2021-09-01 | Discharge: 2021-09-04 | DRG: 951 | Disposition: A | Discharge status: Hospice | Source: Hospice | Attending: Family Medicine | Admitting: Family Medicine

## 2021-09-01 ENCOUNTER — Encounter (HOSPITAL_COMMUNITY): Payer: Self-pay | Admitting: Hematology

## 2021-09-01 DIAGNOSIS — C3490 Malignant neoplasm of unspecified part of unspecified bronchus or lung: Secondary | ICD-10-CM | POA: Diagnosis present

## 2021-09-01 DIAGNOSIS — Z9221 Personal history of antineoplastic chemotherapy: Secondary | ICD-10-CM | POA: Diagnosis not present

## 2021-09-01 DIAGNOSIS — Z20822 Contact with and (suspected) exposure to covid-19: Secondary | ICD-10-CM | POA: Diagnosis present

## 2021-09-01 DIAGNOSIS — R590 Localized enlarged lymph nodes: Secondary | ICD-10-CM | POA: Diagnosis present

## 2021-09-01 DIAGNOSIS — J910 Malignant pleural effusion: Secondary | ICD-10-CM | POA: Diagnosis present

## 2021-09-01 DIAGNOSIS — N401 Enlarged prostate with lower urinary tract symptoms: Secondary | ICD-10-CM | POA: Diagnosis present

## 2021-09-01 DIAGNOSIS — E1165 Type 2 diabetes mellitus with hyperglycemia: Secondary | ICD-10-CM | POA: Diagnosis present

## 2021-09-01 DIAGNOSIS — I480 Paroxysmal atrial fibrillation: Secondary | ICD-10-CM | POA: Diagnosis present

## 2021-09-01 DIAGNOSIS — R338 Other retention of urine: Secondary | ICD-10-CM | POA: Diagnosis present

## 2021-09-01 DIAGNOSIS — J189 Pneumonia, unspecified organism: Secondary | ICD-10-CM | POA: Diagnosis present

## 2021-09-01 DIAGNOSIS — E785 Hyperlipidemia, unspecified: Secondary | ICD-10-CM | POA: Diagnosis present

## 2021-09-01 DIAGNOSIS — Z902 Acquired absence of lung [part of]: Secondary | ICD-10-CM

## 2021-09-01 DIAGNOSIS — Z515 Encounter for palliative care: Principal | ICD-10-CM

## 2021-09-01 DIAGNOSIS — R1314 Dysphagia, pharyngoesophageal phase: Secondary | ICD-10-CM | POA: Diagnosis present

## 2021-09-01 DIAGNOSIS — Z885 Allergy status to narcotic agent status: Secondary | ICD-10-CM

## 2021-09-01 DIAGNOSIS — Z7901 Long term (current) use of anticoagulants: Secondary | ICD-10-CM

## 2021-09-01 DIAGNOSIS — Z66 Do not resuscitate: Secondary | ICD-10-CM | POA: Diagnosis present

## 2021-09-01 DIAGNOSIS — Z888 Allergy status to other drugs, medicaments and biological substances status: Secondary | ICD-10-CM | POA: Diagnosis not present

## 2021-09-01 DIAGNOSIS — Z87891 Personal history of nicotine dependence: Secondary | ICD-10-CM | POA: Diagnosis not present

## 2021-09-01 DIAGNOSIS — Z79899 Other long term (current) drug therapy: Secondary | ICD-10-CM

## 2021-09-01 DIAGNOSIS — K219 Gastro-esophageal reflux disease without esophagitis: Secondary | ICD-10-CM | POA: Diagnosis present

## 2021-09-01 DIAGNOSIS — Z923 Personal history of irradiation: Secondary | ICD-10-CM

## 2021-09-01 DIAGNOSIS — C3491 Malignant neoplasm of unspecified part of right bronchus or lung: Secondary | ICD-10-CM | POA: Diagnosis present

## 2021-09-01 DIAGNOSIS — J9601 Acute respiratory failure with hypoxia: Secondary | ICD-10-CM | POA: Diagnosis not present

## 2021-09-01 DIAGNOSIS — K769 Liver disease, unspecified: Secondary | ICD-10-CM | POA: Diagnosis present

## 2021-09-01 DIAGNOSIS — Z9049 Acquired absence of other specified parts of digestive tract: Secondary | ICD-10-CM | POA: Diagnosis not present

## 2021-09-01 DIAGNOSIS — I10 Essential (primary) hypertension: Secondary | ICD-10-CM | POA: Diagnosis present

## 2021-09-01 DIAGNOSIS — E119 Type 2 diabetes mellitus without complications: Secondary | ICD-10-CM

## 2021-09-01 LAB — MISC LABCORP TEST (SEND OUT): Labcorp test code: 5367

## 2021-09-01 LAB — GLUCOSE, CAPILLARY
Glucose-Capillary: 169 mg/dL — ABNORMAL HIGH (ref 70–99)
Glucose-Capillary: 271 mg/dL — ABNORMAL HIGH (ref 70–99)
Glucose-Capillary: 147 mg/dL — ABNORMAL HIGH (ref 70–99)

## 2021-09-01 LAB — CYTOLOGY - NON PAP

## 2021-09-01 MED ORDER — ONDANSETRON HCL 4 MG/2ML IJ SOLN: 4.0000 mg | Freq: Four times a day (QID) | INTRAMUSCULAR | Status: DC | PRN

## 2021-09-01 MED ORDER — GLYCOPYRROLATE 0.2 MG/ML IJ SOLN
0.2000 mg | INTRAMUSCULAR | Status: DC | PRN
  Filled 2021-09-01: qty 1

## 2021-09-01 MED ORDER — HALOPERIDOL LACTATE 5 MG/ML IJ SOLN
0.5000 mg | INTRAMUSCULAR | Status: DC | PRN
  Administered 2021-09-02 – 2021-09-03 (×3): 0.5 mg via INTRAVENOUS
  Filled 2021-09-01 (×2): qty 1

## 2021-09-01 MED ORDER — ALBUTEROL SULFATE (2.5 MG/3ML) 0.083% IN NEBU
2.5000 mg | INHALATION_SOLUTION | RESPIRATORY_TRACT | Status: DC | PRN
  Administered 2021-09-04: 2.5 mg via RESPIRATORY_TRACT
  Filled 2021-09-01: qty 3

## 2021-09-01 MED ORDER — GLYCOPYRROLATE 0.2 MG/ML IJ SOLN
0.2000 mg | INTRAMUSCULAR | Status: DC | PRN
  Administered 2021-09-03 – 2021-09-04 (×4): 0.2 mg via INTRAVENOUS
  Filled 2021-09-01 (×3): qty 1

## 2021-09-01 MED ORDER — LORAZEPAM 2 MG/ML IJ SOLN
1.0000 mg | INTRAMUSCULAR | Status: DC | PRN
  Administered 2021-09-02 – 2021-09-03 (×2): 1 mg via INTRAVENOUS
  Filled 2021-09-01 (×2): qty 1

## 2021-09-01 MED ORDER — BIOTENE DRY MOUTH MT LIQD: 15.0000 mL | OROMUCOSAL | Status: DC | PRN

## 2021-09-01 MED ORDER — HALOPERIDOL 0.5 MG PO TABS
0.5000 mg | ORAL_TABLET | ORAL | Status: DC | PRN
  Administered 2021-09-03 – 2021-09-04 (×2): 0.5 mg via ORAL
  Filled 2021-09-01 (×2): qty 1

## 2021-09-01 MED ORDER — LORAZEPAM 1 MG PO TABS
1.0000 mg | ORAL_TABLET | ORAL | Status: DC | PRN
  Administered 2021-09-02: 1 mg via ORAL
  Filled 2021-09-01: qty 1

## 2021-09-01 MED ORDER — ACETAMINOPHEN 650 MG RE SUPP: 650.0000 mg | Freq: Four times a day (QID) | RECTAL | Status: DC | PRN

## 2021-09-01 MED ORDER — ACETAMINOPHEN 325 MG PO TABS
650.0000 mg | ORAL_TABLET | Freq: Four times a day (QID) | ORAL | Status: DC | PRN
  Administered 2021-09-02: 650 mg via ORAL
  Filled 2021-09-01: qty 2

## 2021-09-01 MED ORDER — BISACODYL 10 MG RE SUPP: 10.0000 mg | Freq: Every day | RECTAL | Status: DC | PRN

## 2021-09-01 MED ORDER — ONDANSETRON 4 MG PO TBDP: 4.0000 mg | ORAL_TABLET | Freq: Four times a day (QID) | ORAL | Status: DC | PRN

## 2021-09-01 MED ORDER — LORAZEPAM 2 MG/ML PO CONC: 1.0000 mg | ORAL | Status: DC | PRN

## 2021-09-01 MED ORDER — POLYVINYL ALCOHOL 1.4 % OP SOLN: 1.0000 [drp] | Freq: Four times a day (QID) | OPHTHALMIC | Status: DC | PRN

## 2021-09-01 MED ORDER — FENTANYL CITRATE PF 50 MCG/ML IJ SOSY
25.0000 ug | PREFILLED_SYRINGE | INTRAMUSCULAR | Status: DC | PRN
  Administered 2021-09-02 – 2021-09-03 (×2): 25 ug via INTRAVENOUS
  Filled 2021-09-01 (×4): qty 1

## 2021-09-01 MED ORDER — GLYCOPYRROLATE 1 MG PO TABS: 1.0000 mg | ORAL_TABLET | ORAL | Status: DC | PRN

## 2021-09-01 MED ORDER — HALOPERIDOL LACTATE 2 MG/ML PO CONC
0.5000 mg | ORAL | Status: DC | PRN
  Filled 2021-09-01: qty 0.3

## 2021-09-01 NOTE — Progress Notes (Signed)
Pt has ambulated to bathroom x2 today using FWW and 2 assist for stability. Pt feels urge to have BM and is passing flatus, but no stool noted. No coughing noted today, eating and drinking without difficulty. Pt and wife both spoke with Hospice representative today and state understanding of plan of care. ?

## 2021-09-01 NOTE — TOC Progression Note (Addendum)
Transition of Care (TOC) - Progression Note  ? ? ?Patient Details  ?Name: [name] ?MRN: [id number] ?Date of Birth: [birth year] ? ?Transition of Care (TOC) CM/SW Contact  ?Kerin Salen, RN ?Phone Number: ?09/01/2021, 10:36 AM ? ?Clinical Narrative: Tawni Carnes report difficulty with contacting family to sign required consents forms, will continue and come by Hospital to assess patient's ability to sign this afternoon.  ?Hospice assessment done, GIP approved.  ? ? ? ?  ?  ? ?Expected Discharge Plan and Services ?  ?  ?  ?  ?  ?                ?  ?  ?  ?  ?  ?  ?  ?  ?  ?  ? ? ?Social Determinants of Health (SDOH) Interventions ?  ? ?Readmission Risk Interventions ?   ? View : No data to display.  ?  ?  ?  ? ? ?

## 2021-09-01 NOTE — Plan of Care (Signed)

## 2021-09-01 NOTE — H&P (Signed)
?                                                                                           ? ? ? ? Patient Demographics:  ? ? ?[name], is a 88 y.o. male  MRN: [id number]   DOB - 1931-05-24 ? ?Admit Date - 09/01/2021 ? ?Outpatient Primary MD for the patient is Gary Blitz, MD ? ? Assessment & Plan:  ? ?Assessment and Plan: ?Brief Narrative:  ?  ?88 y.o. male who is a reformed smoker with past medical history relevant for prior right lung adenocarcinoma with lobectomy in  June 2017 followed by chemo and Radiotherapy at that time as well as history of paroxysmal atrial fibrillation, currently on anticoagulation DM2, HLD and HTN admitted on 08/30/2021 with acute hypoxic respiratory failure in the setting of recurrent lung malignancy with presumed malignant effusion ?- ?  -Assessment and Plan: ?  ?1)Presumed stage IV metastatic right lung cancer/possible postobstructive pneumonia----status post successful right-sided ultrasound-guided thoracentesis with removal of 2 L of bloody fluid on 08/30/2021 ?-cytology without malignant cells ?-Patient and wife declined PET scan and biopsies ?-Patient and wife state that they will Not want to pursue official biopsy ?-Declined further oncology evaluation ?-Patient status and hypoxia improved significantly after thoracentesis ?-Continue bronchodilators and antibiotics as prescribed ?-prior right lung adenocarcinoma with lobectomy in  June 2017 followed by chemo and Radiotherapy at that time, followed by immunotherapy which she completed in March 2019 ?-recent PET CT scan images with the patient.  It showed bilateral supraclavicular lymph nodes, mediastinal lymph nodes, pleural disease on the right side and liver disease. ?-Repeat chest x-ray on 08/31/2021 with consolidation treated with  IV Rocephin/azithromycin for possible postobstructive pneumonia ?- ?Palliative care and hospice consult  appreciated ?-Patient has been admitted to hospice service ?-Change status to GIP while here pending bed availability at residential hospice house at which time patient will be transferred there- ?  ?2)PAFib--was previously on Eliquis for stroke prophylaxis and Cardizem for rate control ?-Transition to full comfort care/hospice on 09/01/2021 ?  ?3)DM2-A1c 7.4 reflecting uncontrolled DM ?-Transitioned for comfort care ?  ?4)BPH--comfort care ? ?5) social/ethics-- ?Palliative care and hospice consult appreciated ?-Patient has been admitted to hospice service ?-Change status to GIP while here pending bed availability at residential hospice house at which time patient will be transferred there- ? ?Disposition/Need for in-Hospital Stay- patient unable to be discharged at this time due to --- GIP patient awaiting transfer to residential hospice when bed available* ? ?Status is: Inpatien ? ?Dispo: The patient is from: Home ?             Anticipated d/c is to:  Residential hospice ?             Anticipated d/c date is: 1 day ?           Barriers: Awaiting bed availability at residential hospice ? ?With History of - ?Reviewed by me ? ?Past Medical History:  ?Diagnosis Date  ? Adenocarcinoma of lung, stage 3 ([place])   ? Stage IIIA  ? Adenocarcinoma of right lung, stage 3 ([place]) 12/02/2015  ?  Arthritis   ? Atrial fibrillation, transient ([place])   ? transient postop, < 24 hours  ? B12 deficiency   ? Cyst of scrotum   ? Diverticulitis   ? GERD (gastroesophageal reflux disease)   ? Hernia   ? History of pneumonia   ? Hyperlipidemia   ? Lung nodule   ? Spiculated right middle lobe nodule, hypermetabolic  ? Stroke [facility])   ? Type 2 diabetes mellitus ([place])   ?   ? ?Past Surgical History:  ?Procedure Laterality Date  ? BACK SURGERY    ? BIOPSY  12/30/2020  ? Procedure: BIOPSY;  Surgeon: Harvel Quale, MD;  [facility];  Service: Gastroenterology;;  ? CHOLECYSTECTOMY    ? COLONOSCOPY N/A 11/24/2015  ? Procedure:  COLONOSCOPY;  Surgeon: Rogene Houston, MD;  [facility];  Service: Endoscopy;  Laterality: N/A;  730  ? COLONOSCOPY W/ POLYPECTOMY    ? x 5  ? COLONOSCOPY WITH PROPOFOL N/A 12/30/2020  ? Procedure: COLONOSCOPY WITH PROPOFOL;  Surgeon: Harvel Quale, MD;  [facility];  Service: Gastroenterology;  Laterality: N/A;  12:15  ? CYST EXCISION    ? Scrotum  ? ESOPHAGEAL DILATION N/A 12/30/2020  ? Procedure: ESOPHAGEAL DILATION;  Surgeon: Montez Morita, Quillian Quince, MD;  [facility];  Service: Gastroenterology;  Laterality: N/A;  ? ESOPHAGOGASTRODUODENOSCOPY (EGD) WITH PROPOFOL N/A 12/30/2020  ? Procedure: ESOPHAGOGASTRODUODENOSCOPY (EGD) WITH PROPOFOL;  Surgeon: Harvel Quale, MD;  [facility];  Service: Gastroenterology;  Laterality: N/A;  ? EYE SURGERY Bilateral   ? Cataract with Lens  ? HERNIA REPAIR Right   ? Inguinal  ? IR GENERIC HISTORICAL  02/09/2016  ? IR US GUIDE VASC ACCESS RIGHT 02/09/2016 Arne [place], MD [facility]  ? IR GENERIC HISTORICAL  02/09/2016  ? IR FLUORO GUIDE CV LINE RIGHT 02/09/2016 Arne [place], MD [facility]  ? LUMBAR DISC SURGERY    ? per patient report  ? POLYPECTOMY  12/30/2020  ? Procedure: POLYPECTOMY;  Surgeon: Harvel Quale, MD;  [facility];  Service: Gastroenterology;;  ? PORTACATH PLACEMENT    ? VIDEO ASSISTED THORACOSCOPY (VATS)/ LOBECTOMY Right 12/30/2015  ? Procedure: VIDEO ASSISTED THORACOSCOPY (VATS)/RIGHT MIDDLE LOBECTOMY;  Surgeon: Melrose Nakayama, MD;  Location: [place];  Service: Thoracic;  Laterality: Right;  ? ? ? ? ?No chief complaint on file. ?  ? ? HPI:  ? ? [name]  is a 88 y.o. male  ?- ?- ?88 y.o. male who is a reformed smoker with past medical history relevant for prior right lung adenocarcinoma with lobectomy in  June 2017 followed by chemo and Radiotherapy at that time as well as history of paroxysmal atrial fibrillation, currently on anticoagulation DM2, HLD and  HTN admitted on 08/30/2021 with acute hypoxic respiratory failure in the setting of recurrent lung malignancy with presumed malignant effusion ?-  ? ?Palliative care and hospice consult appreciated ?-Patient has been admitted to hospice service ?-Change status to GIP while here pending bed availability at residential hospice house at which time patient will be transferred there- ? ? ? Review of systems:  ?  ?In addition to the HPI above,  ? ?A full Review of  Systems was done, all other systems reviewed are negative except as noted above in HPI , . ? ? ? Social History:  ?Reviewed by me ? ?  ?Social History  ? ?Tobacco Use  ? Smoking status: Former  ?  Types: Cigarettes  ?  Quit date: 05/27/2004  ?  Years since quitting: 17.2  ? Smokeless tobacco: Never  ?Substance Use Topics  ? Alcohol use: No  ?  Alcohol/week: 0.0 standard drinks  ? ? ? Family History :  ?Reviewed by me ? ?  ?Family History  ?Problem Relation Age of Onset  ? Colon cancer Brother   ? ? ? Home Medications:  ? ?Prior to Admission medications   ?Not on File  ? ? ? Allergies:  ? ?  ?Allergies  ?Allergen Reactions  ? Morphine And Related Other (See Comments)  ?  confusion  ? Amitriptyline Other (See Comments)  ?  Confusion  ? Omeprazole Other (See Comments)  ?  Other reaction(s): Delirium  ? Temazepam Other (See Comments)  ?  Other reaction(s): Nightmares  ? Zantac [Ranitidine] Other (See Comments)  ?  Other reaction(s): Delirium  ? ? ? Physical Exam:  ? ?Vitals ?RR 14 ?HR 50 ?Afebrile ? ?Gen:-Resting comfortably, no acute distress  ?HEENT:- [place].AT, No sclera icterus ?Nose-[place] 2L/min ?Neck-[place]le Neck,No JVD,.  ?Lungs-diminished breath sounds, no wheezing  ?CV- S1, S2 normal, right-sided Port-A-Cath noted, irregular rhythm ?Abd-  +ve B.Sounds, Abd Soft, No tenderness,    ?Extremity/Skin:- No  edema,   good pulses ?Psych-episodes of confusion and disorientation  ?neuro-generalized weakness , no new focal deficits,   ? ? ? ? Data Review:  ? ? CBC ?Recent  Labs  ?Lab 08/30/21 ?1128 08/31/21 ?0507  ?WBC 10.7* 10.6*  ?HGB 12.6* 11.4*  ?HCT 38.4* 34.8*  ?PLT 367 347  ?MCV 91.6 92.3  ?MCH 30.1 30.2  ?MCHC 32.8 32.8  ?RDW 13.0 13.0  ?LYMPHSABS 1.2  --   ?MONOABS 0.9  --   ?EOSABS 0.2

## 2021-09-01 NOTE — Discharge Summary (Signed)
?                                                                                ? ? ?[name], is a 88 y.o. male  DOB Aug 28, 1930  MRN [id number]. ? ?Admission date:  08/30/2021  Admitting Physician  Roxan Hockey, MD ? ?Discharge Date:  09/01/2021  ? ?Primary MD  Monico Blitz, MD ? ?Recommendations for primary care physician for things to follow:  ? ? ?Admission Diagnosis  Hypoxemia [R09.02] ?Pleural effusion [J90] ?Respiratory failure with hypoxia ([place]) [J96.91] ? ? ?Discharge Diagnosis  Hypoxemia [R09.02] ?Pleural effusion [J90] ?Respiratory failure with hypoxia ([place]) [J96.91]   ? ?Principal Problem: ?  Acute respiratory failure with hypoxia ([place]) ?Active Problems: ?  Rt Lung Lung cancer -recurrent-stage IV ?  Diabetes ([place]) ?  Atrial fibrillation, transient ([place]) ?    ? ?Past Medical History:  ?Diagnosis Date  ? Adenocarcinoma of lung, stage 3 ([place])   ? Stage IIIA  ? Adenocarcinoma of right lung, stage 3 ([place]) 12/02/2015  ? Arthritis   ? Atrial fibrillation, transient ([place])   ? transient postop, < 24 hours  ? B12 deficiency   ? Cyst of scrotum   ? Diverticulitis   ? GERD (gastroesophageal reflux disease)   ? Hernia   ? History of pneumonia   ? Hyperlipidemia   ? Lung nodule   ? Spiculated right middle lobe nodule, hypermetabolic  ? Stroke [facility])   ? Type 2 diabetes mellitus ([place])   ? ? ?Past Surgical History:  ?Procedure Laterality Date  ? BACK SURGERY    ? BIOPSY  12/30/2020  ? Procedure: BIOPSY;  Surgeon: Harvel Quale, MD;  [facility];  Service: Gastroenterology;;  ? CHOLECYSTECTOMY    ? COLONOSCOPY N/A 11/24/2015  ? Procedure: COLONOSCOPY;  Surgeon: Rogene Houston, MD;  [facility];  Service: Endoscopy;  Laterality: N/A;  730  ? COLONOSCOPY W/ POLYPECTOMY    ? x 5  ? COLONOSCOPY WITH PROPOFOL N/A 12/30/2020  ? Procedure: COLONOSCOPY WITH PROPOFOL;  Surgeon: Harvel Quale, MD;  [facility];  Service: Gastroenterology;  Laterality: N/A;  12:15  ?  CYST EXCISION    ? Scrotum  ? ESOPHAGEAL DILATION N/A 12/30/2020  ? Procedure: ESOPHAGEAL DILATION;  Surgeon: Montez Morita, Quillian Quince, MD;  [facility];  Service: Gastroenterology;  Laterality: N/A;  ? ESOPHAGOGASTRODUODENOSCOPY (EGD) WITH PROPOFOL N/A 12/30/2020  ? Procedure: ESOPHAGOGASTRODUODENOSCOPY (EGD) WITH PROPOFOL;  Surgeon: Harvel Quale, MD;  [facility];  Service: Gastroenterology;  Laterality: N/A;  ? EYE SURGERY Bilateral   ? Cataract with Lens  ? HERNIA REPAIR Right   ? Ingu[place]  ? IR GENERIC HISTORICAL  02/09/2016  ? IR US GUIDE VASC ACCESS RIGHT 02/09/2016 Arne Cleveland, MD [facility]  ? IR GENERIC HISTORICAL  02/09/2016  ? IR FLUORO GUIDE CV LINE RIGHT 02/09/2016 Arne Cleveland, MD [facility]  ? LUMBAR DISC SURGERY    ? per patient report  ? POLYPECTOMY  12/30/2020  ? Procedure: POLYPECTOMY;  Surgeon: Harvel Quale, MD;  [facility];  Service: Gastroenterology;;  ? PORTACATH PLACEMENT    ?  VIDEO ASSISTED THORACOSCOPY (VATS)/ LOBECTOMY Right 12/30/2015  ? Procedure: VIDEO ASSISTED THORACOSCOPY (VATS)/RIGHT MIDDLE LOBECTOMY;  Surgeon: Melrose Nakayama, MD;  Location: [facility];  Service: Thoracic;  Laterality: Right;  ? ? ? ? ? HPI  from the history and physical done on the day of admission:  ? ? [name]  is a 88 y.o. male who is a reformed smoker with past medical history relevant for prior right lung adenocarcinoma with lobectomy in  June 2017 followed by chemo and Radiotherapy at that time as well as history of paroxysmal atrial fibrillation, currently on anticoagulation DM2, HLD and HTN who presents to the ED with concerns about worsening dyspnea and is found to be transiently hypoxic in the ED ?- ?Patient was recently admitted to this hospital from 08/22/2021 thru  08/24/21 with syncope and A-fib ?- ?Additional history obtained from patient's wife at bedside ?-Patient apparently has had increasing weakness poor appetite and poor  oral intake due to worsening dyspnea ?- ?In the ED chest x-ray showed large right-sided pleural effusion ?-Patient underwent right-sided ultrasound-guided thoracentesis on 08/30/2021 with removal of 2 L of bloody fluid--- cytology and other studies are pending ?-CBC with white count of 10.7 hemoglobin is 12.6 ?-Chemistry with a creatinine of 1.0 potassium 4.3 ?-COVID and flu negative ? ? ? ? Hospital Course:  ? ?  ?Brief Narrative:  ?  ?88 y.o. male who is a reformed smoker with past medical history relevant for prior right lung adenocarcinoma with lobectomy in  June 2017 followed by chemo and Radiotherapy at that time as well as history of paroxysmal atrial fibrillation, currently on anticoagulation DM2, HLD and HTN admitted on 08/30/2021 with acute hypoxic respiratory failure in the setting of recurrent lung malignancy with presumed malignant effusion ?- ?  ?  ?-Assessment and Plan: ?  ?1)Presumed stage IV metastatic right lung cancer/possible postobstructive pneumonia----status post successful right-sided ultrasound-guided thoracentesis with removal of 2 L of bloody fluid on 08/30/2021 ?-cytology without malignant cells ?-Patient and wife declined PET scan and biopsies ?-Patient and wife state that they will Not want to pursue official biopsy ?-Declined further oncology evaluation ?-Patient status and hypoxia improved significantly after thoracentesis ?-Continue bronchodilators and antibiotics as prescribed ?-prior right lung adenocarcinoma with lobectomy in  June 2017 followed by chemo and Radiotherapy at that time, followed by immunotherapy which she completed in March 2019 ?-recent PET CT scan images with the patient.  It showed bilateral supraclavicular lymph nodes, mediastinal lymph nodes, pleural disease on the right side and liver disease. ?-Repeat chest x-ray on 08/31/2021 with consolidation treated with  IV Rocephin/azithromycin for possible postobstructive pneumonia ?- ?Palliative care and hospice consult  appreciated ?-Patient has been admitted to hospice service ?-Change status to GIP while here pending bed availability at residential hospice house at which time patient will be transferred there- ?  ?2)PAFib--was previously on Eliquis for stroke prophylaxis and Cardizem for rate control ?-Transition to full comfort care/hospice on 09/01/2021 ?  ?3)DM2-A1c 7.4 reflecting uncontrolled DM ?-Transitioned for comfort care ?  ?4)BPH--continue Hytrin nightly ?  ?5) social/ethics-- ?Palliative care and hospice consult appreciated ?-Patient has been admitted to hospice service ?-Change status to GIP while here pending bed availability at residential hospice house at which time patient will be transferred there- ?  ?Discharge Condition: [place] prognosis ?Consults obtained -hospice/palliative care ? ?Diet and Activity recommendation:  As advised ? ?Discharge Instructions   ? ?--Awaiting transfer to residential hospice house pending bed availability ? ? ? ? Discharge Medications  ? ?  ?  Allergies as of 09/01/2021   ? ?   Reactions  ? Morphine And Related Other (See Comments)  ? confusion  ? Amitriptyline Other (See Comments)  ? Confusion  ? Omeprazole Other (See Comments)  ? Other reaction(s): Delirium  ? Temazepam Other (See Comments)  ? Other reaction(s): Nightmares  ? Zantac [ranitidine] Other (See Comments)  ? Other reaction(s): Delirium  ? ?  ? ?  ?Medication List  ?  ? ?STOP taking these medications   ? ?Accu-Chek Aviva Plus test strip ?Generic drug: glucose blood ?  ?Accu-Chek FastClix Lancets Misc ?  ?acetaminophen 500 MG tablet ?Commonly known as: TYLENOL ?  ?ALPRAZolam 0.5 MG tablet ?Commonly known as: Duanne Moron ?  ?apixaban 5 MG Tabs tablet ?Commonly known as: ELIQUIS ?  ?atorvastatin 20 MG tablet ?Commonly known as: LIPITOR ?  ?benzonatate 100 MG capsule ?Commonly known as: TESSALON ?  ?calcium carbonate 1250 (500 Ca) MG tablet ?Commonly known as: OS-CAL - dosed in mg of elemental calcium ?  ?Carboxymethylcellulose Sodium  0.25 % Soln ?  ?cyanocobalamin 1000 MCG/ML injection ?Commonly known as: (VITAMIN B-12) ?  ?diltiazem 120 MG 24 hr capsule ?Commonly known as: CARDIZEM CD ?  ?gabapentin 100 MG capsule ?Commonly known as: NE

## 2021-09-02 DIAGNOSIS — Z515 Encounter for palliative care: Secondary | ICD-10-CM | POA: Diagnosis not present

## 2021-09-02 MED ORDER — BISACODYL 10 MG RE SUPP
10.0000 mg | Freq: Once | RECTAL | Status: DC
Start: 2021-09-02 — End: 2021-09-04
  Filled 2021-09-02: qty 1

## 2021-09-02 MED ORDER — SENNOSIDES-DOCUSATE SODIUM 8.6-50 MG PO TABS
2.0000 | ORAL_TABLET | Freq: Every morning | ORAL | Status: DC
  Administered 2021-09-02 – 2021-09-04 (×3): 2 via ORAL
  Filled 2021-09-02 (×3): qty 2

## 2021-09-02 MED ORDER — SENNOSIDES-DOCUSATE SODIUM 8.6-50 MG PO TABS: 2.0000 | ORAL_TABLET | Freq: Every day | ORAL | Status: DC

## 2021-09-02 NOTE — Progress Notes (Signed)
CSW spoke with Network engineer at [facility] who states there are no beds available today. ? ?Madilyn Fireman, MSW, LCSW ?Transitions of Care  Clinical Social Worker II ?[phone] ? ?

## 2021-09-02 NOTE — Progress Notes (Signed)
?PROGRESS NOTE ? ? ? ? ?[name], is a 88 y.o. male, DOB - 02-09-1931, ZOX:[id number] ? ?Admit date - 09/01/2021   Admitting Physician Jasmarie Coppock Denton Brick, MD ? ?Outpatient Primary MD for the patient is Monico Blitz, MD ? ?LOS - 1 ?CC--hospice pt ?    ? ?Brief Narrative:  ?  ?88 y.o. male who is a reformed smoker with past medical history relevant for prior right lung adenocarcinoma with lobectomy in  June 2017 followed by chemo and Radiotherapy at that time as well as history of paroxysmal atrial fibrillation, currently on anticoagulation DM2, HLD and HTN admitted on 08/30/2021 with acute hypoxic respiratory failure in the setting of recurrent lung malignancy with presumed malignant effusion ?- ?Palliative care and hospice consult appreciated ?-Patient has been admitted to hospice service ?-Change status to GIP while here pending bed availability at residential hospice house at which time patient will be transferred there- ?- ?  -Assessment and Plan: ?  ?1)Presumed stage IV metastatic right lung cancer/possible postobstructive pneumonia----status post successful right-sided ultrasound-guided thoracentesis with removal of 2 L of bloody fluid on 08/30/2021 ?-cytology without malignant cells ?-Patient and wife declined PET scan and biopsies ?-Patient and wife state that they will Not want to pursue official biopsy ?-Declined further oncology evaluation ?-Patient status and hypoxia improved significantly after thoracentesis ?-Continue bronchodilators and antibiotics as prescribed ?-prior right lung adenocarcinoma with lobectomy in  June 2017 followed by chemo and Radiotherapy at that time, followed by immunotherapy which she completed in March 2019 ?-recent PET CT scan images with the patient.  It showed bilateral supraclavicular lymph nodes, mediastinal lymph nodes, pleural disease on the right side and liver disease. ?-Repeat chest x-ray on 08/31/2021 with consolidation treated with  IV Rocephin/azithromycin for possible  postobstructive pneumonia ?- ?Palliative care and hospice consult appreciated ?-Patient has been admitted to hospice service ?-Changed status to GIP while here pending bed availability at residential hospice house at which time patient will be transferred there- ?  ?2)PAFib--was previously on Eliquis for stroke prophylaxis and Cardizem for rate control ?-Transitioned to full comfort care/hospice on 09/01/2021 ?  ?3)DM2-A1c 7.4 reflecting uncontrolled DM ?-Transitioned for comfort care ?  ?4)BPH--comfort care ?  ?5) social/ethics-- ?Palliative care and hospice consult appreciated ?-Patient has been admitted to hospice service ?-Change status to GIP while here pending bed availability at residential hospice house at which time patient will be transferred there- ?  ?Disposition/Need for in-Hospital Stay- patient unable to be discharged at this time due to --- GIP patient awaiting transfer to residential hospice when bed available* ?  ?Status is: Inpatient ?  ?Dispo: The patient is from: Home ?             Anticipated d/c is to:  Residential hospice ?             Anticipated d/c date is: 1 day ?           Barriers: Awaiting bed availability at residential hospice ? ?Code Status :  -  Code Status: DNR  ? ?Family Communication:  Discussed with patient's son Mr. Nathaneil Canary Junior, patient's grandson Mr. Maribel III, patient granddaughter and patient's daughter-in-law at bedside ?-Also discussed with patient's wife previously ? ?DVT Prophylaxis  :   -Comfort care ? ? ?Lab Results  ?Component Value Date  ? PLT 347 08/31/2021  ? ? ?Inpatient Medications ? ?Scheduled Meds: ? bisacodyl  10 mg Rectal Once  ? senna-docusate  2 tablet Oral q morning  ? ?Continuous Infusions: ?PRN  Meds:.acetaminophen **OR** acetaminophen, albuterol, antiseptic oral rinse, bisacodyl, fentaNYL (SUBLIMAZE) injection, glycopyrrolate **OR** glycopyrrolate **OR** glycopyrrolate, haloperidol **OR** haloperidol **OR** haloperidol lactate, LORazepam **OR**  [DISCONTINUED] LORazepam **OR** LORazepam, ondansetron **OR** ondansetron (ZOFRAN) IV, polyvinyl alcohol ? ? ?Anti-infectives (From admission, onward)  ? ? None  ? ?  ? ?  ?Subjective: ?[name] today has no fevers, no emesis,  No chest pain,   ?- ?Resting comfortably, oral intake is fair ?-patient's son Mr. Nathaneil Canary Junior, patient's grandson Mr. Terrion III, patient granddaughter and patient's daughter-in-law at bedside ?- ?Questions answered ? ? ?Objective: ?Vitals:  ? 09/01/21 2132 09/02/21 0525 09/02/21 1250  ?BP: (!) 137/49 (!) 127/53 130/62  ?Pulse: 89 95 98  ?Resp: 18 19 18   ?Temp: 97.8 ?F (36.6 ?C) 98 ?F (36.7 ?C) 98.8 ?F (37.1 ?C)  ?TempSrc: Oral Oral Oral  ?SpO2: 91% 91% 91%  ? ? ?Intake/Output Summary (Last 24 hours) at 09/02/2021 1649 ?Last data filed at 09/02/2021 1300 ?Gross per 24 hour  ?Intake 480 ml  ?Output --  ?Net 480 ml  ? ?There were no vitals filed for this visit. ? ?Physical Exam ? ?Gen:-Resting comfortably, no acute distress  ?HEENT:- [place].AT, No sclera icterus ?Nose-[place] 2L/min ?Neck-[place]ck,No JVD,.  ?Lungs-diminished breath sounds, no wheezing  ?CV- S1, S2 normal, right-sided Port-A-Cath noted, irregular rhythm ?Abd-  +ve B.Sounds, Abd Soft, No tenderness,    ?Extremity/Skin:- No  edema,   good pulses ?Psych-episodes of confusion and disorientation  ?neuro-generalized weakness , no new focal deficits,   ? ?Data Reviewed: I have personally reviewed following labs and imaging studies ? ?CBC: ?Recent Labs  ?Lab 08/30/21 ?1128 08/31/21 ?0507  ?WBC 10.7* 10.6*  ?NEUTROABS 8.3*  --   ?HGB 12.6* 11.4*  ?HCT 38.4* 34.8*  ?MCV 91.6 92.3  ?PLT 367 347  ? ?Basic Metabolic Panel: ?Recent Labs  ?Lab 08/30/21 ?1128 08/31/21 ?0507  ?NA 136 135  ?K 4.3 4.4  ?CL 103 103  ?CO2 24 23  ?GLUCOSE 187* 173*  ?BUN 10 13  ?CREATININE 1.08 1.07  ?CALCIUM 8.4* 8.2*  ? ?GFR: ?Estimated Creatinine Clearance: 51.4 mL/min (by C-G formula based on SCr of 1.07 mg/dL). ?Liver Function Tests: ?Recent Labs  ?Lab  08/30/21 ?1128  ?AST 47*  ?ALT 25  ?ALKPHOS 119  ?BILITOT 0.6  ?PROT 6.1*  ?ALBUMIN 2.9*  ? ?Cardiac Enzymes: ?No results for input(s): CKTOTAL, CKMB, CKMBINDEX, TROPONINI in the last 168 hours. ?BNP (last 3 results) ?No results for input(s): PROBNP in the last 8760 hours. ?HbA1C: ?No results for input(s): HGBA1C in the last 72 hours. ?Sepsis Labs: ?@LABRCNTIP (procalcitonin:4,lacticidven:4) ?) ?Recent Results (from the past 240 hour(s))  ?Blood culture (routine x 2)     Status: None (Preliminary result)  ? Collection Time: 08/30/21 11:28 AM  ? Specimen: BLOOD LEFT ARM  ?Result Value Ref Range Status  ? Specimen Description BLOOD LEFT ARM  Final  ? Special Requests   Final  ?  BOTTLES DRAWN AEROBIC AND ANAEROBIC Blood Culture adequate volume  ? Culture   Final  ?  NO GROWTH 3 DAYS ?Performed at [facility], [address] [place],  [address] ?  ? Report Status PENDING  Incomplete  ?Blood culture (routine x 2)     Status: None (Preliminary result)  ? Collection Time: 08/30/21 12:15 PM  ? Specimen: BLOOD LEFT HAND  ?Result Value Ref Range Status  ? Specimen Description BLOOD LEFT HAND  Final  ? Special Requests   Final  ?  BOTTLES DRAWN AEROBIC AND  ANAEROBIC Blood Culture adequate volume  ? Culture   Final  ?  NO GROWTH 3 DAYS ?Performed at [facility], [address], [place], [place] [address] ?  ? Report Status PENDING  Incomplete  ?Resp Panel by RT-PCR (Flu A&B, Covid) Nasopharyngeal Swab     Status: None  ? Collection Time: 08/30/21 12:48 PM  ? Specimen: Nasopharyngeal Swab; Nasopharyngeal(NP) swabs in vial transport medium  ?Result Value Ref Range Status  ? SARS Coronavirus 2 by RT PCR NEGATIVE NEGATIVE Final  ?  Comment: (NOTE) ?SARS-CoV-2 target nucleic acids are NOT DETECTED. ? ?The SARS-CoV-2 RNA is generally detectable in upper respiratory ?specimens during the acute phase of infection. The lowest ?concentration of SARS-CoV-2 viral copies this assay can detect is ?138 copies/mL. A negative result  does not preclude SARS-Cov-2 ?infection and should not be used as the sole basis for treatment or ?other patient management decisions. A negative result may occur with  ?improper specimen collection/handling, submission

## 2021-09-03 DIAGNOSIS — E1165 Type 2 diabetes mellitus with hyperglycemia: Secondary | ICD-10-CM

## 2021-09-03 DIAGNOSIS — C3491 Malignant neoplasm of unspecified part of right bronchus or lung: Secondary | ICD-10-CM | POA: Diagnosis not present

## 2021-09-03 DIAGNOSIS — J9601 Acute respiratory failure with hypoxia: Secondary | ICD-10-CM | POA: Diagnosis not present

## 2021-09-03 DIAGNOSIS — Z515 Encounter for palliative care: Secondary | ICD-10-CM | POA: Diagnosis not present

## 2021-09-03 DIAGNOSIS — R1314 Dysphagia, pharyngoesophageal phase: Secondary | ICD-10-CM

## 2021-09-03 MED ORDER — HYDROCODONE BIT-HOMATROP MBR 5-1.5 MG/5ML PO SOLN
5.0000 mL | Freq: Four times a day (QID) | ORAL | Status: DC | PRN
  Administered 2021-09-03 – 2021-09-04 (×3): 5 mL via ORAL
  Filled 2021-09-03 (×3): qty 5

## 2021-09-03 NOTE — Assessment & Plan Note (Addendum)
Continue as needed meds, continue management per palliative care provider at hospice. Ativan, haldol, and IV fentanyl have been helpful for symptoms. Changing IV fentanyl to oral option, dilaudid due to intolerance to morphine in the past. ?

## 2021-09-03 NOTE — Hospital Course (Signed)
88 y.o. male who is a reformed smoker with past medical history relevant for prior right lung adenocarcinoma with lobectomy in  June 2017 followed by chemo and Radiotherapy at that time as well as history of paroxysmal atrial fibrillation, currently on anticoagulation DM2, HLD and HTN admitted on 08/30/2021 with acute hypoxic respiratory failure in the setting of recurrent lung malignancy with presumed malignant effusion ?- ?Palliative care and hospice consult appreciated ?-Patient has been admitted to hospice service ?-Change status to GIP while here pending bed availability at residential hospice house at which time patient will be transferred there- ?- ?

## 2021-09-03 NOTE — Progress Notes (Signed)
?PROGRESS NOTE ? ?[name] FFM:[id number] DOB: 16-Nov-1930  ? ?PCP: Monico Blitz, MD ? ?Patient is from: Home. ? ?DOA: 09/01/2021 LOS: 2 ? ?Chief complaints ?No chief complaint on file. ?  ? ?Brief Narrative / Interim history: ?88 y.o. male who is a reformed smoker with past medical history relevant for prior right lung adenocarcinoma with lobectomy in  June 2017 followed by chemo and Radiotherapy at that time as well as history of paroxysmal atrial fibrillation, currently on anticoagulation DM2, HLD and HTN admitted on 08/30/2021 with acute hypoxic respiratory failure in the setting of recurrent lung malignancy with presumed malignant effusion ?- ?Palliative care and hospice consult appreciated ?-Patient has been admitted to hospice service ?-Change status to GIP while here pending bed availability at residential hospice house at which time patient will be transferred there- ?-  ? ?Subjective: ?Seen and examined earlier this morning.  No major events overnight of this morning.  He complains cough with some clear phlegm. ? ?Objective: ?Vitals:  ? 09/02/21 1250 09/02/21 2127 09/03/21 0620 09/03/21 1429  ?BP: 130/62 (!) 112/34 114/85 121/61  ?Pulse: 98 (!) 106 (!) 106 (!) 105  ?Resp: 18 (!) 24 (!) 22 (!) 22  ?Temp: 98.8 ?F (37.1 ?C) 97.7 ?F (36.5 ?C) 98 ?F (36.7 ?C) 98.3 ?F (36.8 ?C)  ?TempSrc: Oral Oral Oral Oral  ?SpO2: 91% 92% 92% 92%  ? ? ?Examination: ? ?GENERAL: Some distress from cough.  Nontoxic. ?HEENT: MMM.  Vision and hearing grossly intact.  ?NECK: Supple.  No apparent JVD.  ?RESP:  No IWOB.  Fair aeration bilaterally.  Coughing. ?CVS:  RRR. Heart sounds normal.  ?MSK/EXT:  Moves extremities. No apparent deformity. No edema.  ?NEURO: Awake and fairly oriented.  No apparent focal neuro deficit. ?PSYCH: Calm. Normal affect.  ? ?Procedures:  ?None ? ? ?Assessment and Plan: ?* End of life care ?Continue as needed meds ?Awaiting bed at residential hospice. ?Appreciate help by palliative medicine and  hospice ? ?Rt Lung Lung cancer -recurrent-stage IV ?Presumed stage IV metastatic right lung cancer with possible postobstructive pneumonia. ?Patient and wife declined PET scan or biopsies and opted for full comfort care. ? ?Acute respiratory failure with hypoxia ([place]) ?Likely due to lung cancer ?Full comfort care. ?Added Hycodan as needed for cough ? ?Dysphagia, pharyngoesophageal phase ?Comfort feed ? ? ? ? ?DVT prophylaxis:  ? Patient is full comfort care. ? ?Code Status: DNR/DNI ?Family Communication: Patient and/or RN. Available if any question.  ?Level of care: Med-Surg ? ? ?Final disposition: Residential hospice ? ?Consultants:  ?Palliative medicine ? ?Sch Meds:  ?Scheduled Meds: ? bisacodyl  10 mg Rectal Once  ? senna-docusate  2 tablet Oral q morning  ? ?Continuous Infusions: ?PRN Meds:.acetaminophen **OR** acetaminophen, albuterol, antiseptic oral rinse, bisacodyl, fentaNYL (SUBLIMAZE) injection, glycopyrrolate **OR** glycopyrrolate **OR** glycopyrrolate, haloperidol **OR** haloperidol **OR** haloperidol lactate, HYDROcodone bit-homatropine, LORazepam **OR** [DISCONTINUED] LORazepam **OR** LORazepam, ondansetron **OR** ondansetron (ZOFRAN) IV, polyvinyl alcohol ? ?Antimicrobials: ?Anti-infectives (From admission, onward)  ? ? None  ? ?  ? ? ? ?I have personally reviewed the following labs and images: ?CBC: ?Recent Labs  ?Lab 08/30/21 ?1128 08/31/21 ?0507  ?WBC 10.7* 10.6*  ?NEUTROABS 8.3*  --   ?HGB 12.6* 11.4*  ?HCT 38.4* 34.8*  ?MCV 91.6 92.3  ?PLT 367 347  ? ?BMP &GFR ?Recent Labs  ?Lab 08/30/21 ?1128 08/31/21 ?0507  ?NA 136 135  ?K 4.3 4.4  ?CL 103 103  ?CO2 24 23  ?GLUCOSE 187* 173*  ?BUN 10 13  ?  CREATININE 1.08 1.07  ?CALCIUM 8.4* 8.2*  ? ?Estimated Creatinine Clearance: 51.4 mL/min (by C-G formula based on SCr of 1.07 mg/dL). ?Liver & Pancreas: ?Recent Labs  ?Lab 08/30/21 ?1128  ?AST 47*  ?ALT 25  ?ALKPHOS 119  ?BILITOT 0.6  ?PROT 6.1*  ?ALBUMIN 2.9*  ? ?No results for input(s): LIPASE, AMYLASE in  the last 168 hours. ?No results for input(s): AMMONIA in the last 168 hours. ?Diabetic: ?No results for input(s): HGBA1C in the last 72 hours. ?Recent Labs  ?Lab 08/31/21 ?1613 08/31/21 ?2121 09/01/21 ?0707 09/01/21 ?1113 09/01/21 ?1605  ?GLUCAP 143* 168* 169* 271* 147*  ? ?Cardiac Enzymes: ?No results for input(s): CKTOTAL, CKMB, CKMBINDEX, TROPONINI in the last 168 hours. ?No results for input(s): PROBNP in the last 8760 hours. ?Coagulation Profile: ?No results for input(s): INR, PROTIME in the last 168 hours. ?Thyroid Function Tests: ?No results for input(s): TSH, T4TOTAL, FREET4, T3FREE, THYROIDAB in the last 72 hours. ?Lipid Profile: ?No results for input(s): CHOL, HDL, LDLCALC, TRIG, CHOLHDL, LDLDIRECT in the last 72 hours. ?Anemia Panel: ?No results for input(s): VITAMINB12, FOLATE, FERRITIN, TIBC, IRON, RETICCTPCT in the last 72 hours. ?Urine analysis: ?   ?Component Value Date/Time  ? COLORURINE YELLOW 04/11/2018 1012  ? APPEARANCEUR CLEAR 04/11/2018 1012  ? LABSPEC 1.004 (L) 04/11/2018 1012  ? PHURINE 6.0 04/11/2018 1012  ? GLUCOSEU NEGATIVE 04/11/2018 1012  ? HGBUR MODERATE (A) 04/11/2018 1012  ? BILIRUBINUR negative 11/05/2019 1007  ? KETONESUR negative 11/05/2019 1007  ? [place] NEGATIVE 04/11/2018 1012  ? PROTEINUR negative 11/05/2019 1007  ? PROTEINUR NEGATIVE 04/11/2018 1012  ? UROBILINOGEN 0.2 11/05/2019 1007  ? UROBILINOGEN 0.2 09/18/2013 1215  ? NITRITE Negative 11/05/2019 1007  ? NITRITE NEGATIVE 04/11/2018 1012  ? LEUKOCYTESUR Negative 11/05/2019 1007  ? ?Sepsis Labs: ?Invalid input(s): PROCALCITONIN, LACTICIDVEN ? ?Microbiology: ?Recent Results (from the past 240 hour(s))  ?Blood culture (routine x 2)     Status: None (Preliminary result)  ? Collection Time: 08/30/21 11:28 AM  ? Specimen: BLOOD LEFT ARM  ?Result Value Ref Range Status  ? Specimen Description BLOOD LEFT ARM  Final  ? Special Requests   Final  ?  BOTTLES DRAWN AEROBIC AND ANAEROBIC Blood Culture adequate volume  ? Culture    Final  ?  NO GROWTH 3 DAYS ?Performed at [facility], [address], [place], [place] [address] ?  ? Report Status PENDING  Incomplete  ?Blood culture (routine x 2)     Status: None (Preliminary result)  ? Collection Time: 08/30/21 12:15 PM  ? Specimen: BLOOD LEFT HAND  ?Result Value Ref Range Status  ? Specimen Description BLOOD LEFT HAND  Final  ? Special Requests   Final  ?  BOTTLES DRAWN AEROBIC AND ANAEROBIC Blood Culture adequate volume  ? Culture   Final  ?  NO GROWTH 3 DAYS ?Performed at [facility], [address] [place], [place] [address] ?  ? Report Status PENDING  Incomplete  ?Resp Panel by RT-PCR (Flu A&B, Covid) Nasopharyngeal Swab     Status: None  ? Collection Time: 08/30/21 12:48 PM  ? Specimen: Nasopharyngeal Swab; Nasopharyngeal(NP) swabs in vial transport medium  ?Result Value Ref Range Status  ? SARS Coronavirus 2 by RT PCR NEGATIVE NEGATIVE Final  ?  Comment: (NOTE) ?SARS-CoV-2 target nucleic acids are NOT DETECTED. ? ?The SARS-CoV-2 RNA is generally detectable in upper respiratory ?specimens during the acute phase of infection. The lowest ?concentration of SARS-CoV-2 viral copies this assay can detect is ?[place]  copies/mL. A negative result does not preclude SARS-Cov-2 ?infection and should not be used as the sole basis for treatment or ?other patient management decisions. A negative result may occur with  ?improper specimen collection/handling, submission of specimen other ?than nasopharyngeal swab, presence of viral mutation(s) within the ?areas targeted by this assay, and inadequate number of viral ?copies(<138 copies/mL). A negative result must be combined with ?clinical observations, patient history, and epidemiological ?information. The expected result is Negative. ? ?Fact Sheet for Patients:  ?EntrepreneurPulse.com.au ? ?Fact Sheet for Healthcare Providers:  ?IncredibleEmployment.be ? ?This test is no t yet approved or cleared by the [place] FDA  and  ?has been authorized for detection and/or diagnosis of SARS-CoV-2 by ?FDA under an Emergency Use Authorization (EUA). This EUA will remain  ?in effect (meaning this test can be used) for the duration of the ?CO

## 2021-09-03 NOTE — Assessment & Plan Note (Signed)
Comfort feed ?

## 2021-09-03 NOTE — Assessment & Plan Note (Addendum)
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 Effusion likely to recur. Support respiratory status with prn opioids.  ?

## 2021-09-03 NOTE — Progress Notes (Signed)
CSW spoke with Network engineer at [facility] who states there are no beds available today. Secretary also states she does not see a referral for patient so CSW faxed clinicals. ? ?Madilyn Fireman, MSW, LCSW ?Transitions of Care  Clinical Social Worker II ?[phone] ? ?

## 2021-09-03 NOTE — Assessment & Plan Note (Signed)
Presumed stage IV metastatic right lung cancer with possible postobstructive pneumonia. ?Patient and wife declined PET scan or biopsies and opted for full comfort care. ?

## 2021-09-03 NOTE — Progress Notes (Signed)
Pt ambulated to bathroom using FWW and one assist x2 since 1600. Passing lots of flatus but no BM. Tolerates ambulation well, denies c/o. Wife at bedside. ?

## 2021-09-03 NOTE — Progress Notes (Signed)
Pt assisted up to recliner with staff x2 and FWW. Pt able to stand and has good leg strength, gait still wobbly, able to take several small steps and sit slowly in the chair. Chair alarm on for safety, call bell in lap, bedside table within reach. Family at bedside. Pt denies any needs at this time. ? ?Pt with good cough effort, expectorated large amount of thick yellow phlegm x2. States feels better. ?

## 2021-09-04 DIAGNOSIS — J9601 Acute respiratory failure with hypoxia: Secondary | ICD-10-CM | POA: Diagnosis not present

## 2021-09-04 DIAGNOSIS — Z515 Encounter for palliative care: Secondary | ICD-10-CM | POA: Diagnosis not present

## 2021-09-04 DIAGNOSIS — E1165 Type 2 diabetes mellitus with hyperglycemia: Secondary | ICD-10-CM | POA: Diagnosis not present

## 2021-09-04 DIAGNOSIS — R338 Other retention of urine: Secondary | ICD-10-CM

## 2021-09-04 LAB — CULTURE, BLOOD (ROUTINE X 2)
Culture: NO GROWTH
Culture: NO GROWTH
Special Requests: ADEQUATE
Special Requests: ADEQUATE

## 2021-09-04 LAB — CULTURE, BODY FLUID W GRAM STAIN -BOTTLE
Culture: NO GROWTH
Special Requests: ADEQUATE

## 2021-09-04 LAB — RESP PANEL BY RT-PCR (FLU A&B, COVID) ARPGX2
Influenza A by PCR: NEGATIVE
Influenza B by PCR: NEGATIVE
SARS Coronavirus 2 by RT PCR: NEGATIVE

## 2021-09-04 MED ORDER — LORAZEPAM 1 MG PO TABS: 1.0000 mg | ORAL_TABLET | ORAL | Status: DC | PRN

## 2021-09-04 MED ORDER — ONDANSETRON 4 MG PO TBDP: 4.0000 mg | ORAL_TABLET | Freq: Four times a day (QID) | ORAL | Status: DC | PRN

## 2021-09-04 MED ORDER — HYDROMORPHONE HCL 2 MG PO TABS: 2.0000 mg | ORAL_TABLET | Freq: Four times a day (QID) | ORAL | Status: DC | PRN

## 2021-09-04 MED ORDER — LIDOCAINE HCL URETHRAL/MUCOSAL 2 % EX GEL
1.0000 "application " | Freq: Once | CUTANEOUS | Status: DC
  Filled 2021-09-04: qty 5

## 2021-09-04 MED ORDER — HALOPERIDOL 0.5 MG PO TABS: 0.5000 mg | ORAL_TABLET | ORAL | Status: DC | PRN

## 2021-09-04 NOTE — Assessment & Plan Note (Signed)
Place foley for comfort at end of life. ?

## 2021-09-04 NOTE — TOC Transition Note (Signed)
Transition of Care (TOC) - CM/SW Discharge Note ? ? ?Patient Details  ?Name: [name] ?MRN: [id number] ?Date of Birth: 1930-12-02 ? ?Transition of Care (TOC) CM/SW Contact:  ?Salome Arnt, LCSW ?Phone Number: ?09/04/2021, 11:54 AM ? ? ?Clinical Narrative:  Bed available at [facility] today. Pt's wife notified and agreeable. Will transfer via [facility]. D/C summary faxed. RN given number to call report.   ? ? ? ?Final next level of care: [place] ?Barriers to Discharge: Barriers Resolved ? ? ?Patient Goals and CMS Choice ?  ?  ?  ? ?Discharge Placement ?  ?           ?  ?  ?Name of family member notified: wife ?Patient and family notified of of transfer: 09/04/21 ? ?Discharge Plan and Services ?  ?  ?           ?  ?  ?  ?  ?  ?  ?  ?  ?  ?  ? ?Social Determinants of Health (SDOH) Interventions ?  ? ? ?Readmission Risk Interventions ?   ? View : No data to display.  ?  ?  ?  ? ? ? ? ? ?

## 2021-09-04 NOTE — Progress Notes (Signed)
Called to room again at approximately 0300 by patient's spouse, he was sitting on the toilet trying to void but unable to. Bladder scanned showed 274ml. Dr. Josephine Cables notified, order give for in/out cath x 1. 573ml of amber color urine obtained without difficulty. Patient tolerated well. Assisted by Deeann Dowse, RN ?

## 2021-09-04 NOTE — Progress Notes (Signed)
Called to room at approximately 2345 by spouse, patient was repeatedly stating he needed to pee. Also very restless and agitated. Condom cath was intact with small amount of clear yellow urine in drainage bag. Performed bladder scan which showed 955ml. Dr. Josephine Cables notified, new order given for in/out cath which was performed. 726ml pale, clear, yellow urine obtained without difficulty. Assisted by Deeann Dowse, RN.  ?

## 2021-09-04 NOTE — Progress Notes (Signed)
Pt discharged to [facility] via Biomedical scientist by EMS. Pt's wife secured pt's belongings. IV site left intact at discharge in left [facility]. Foley cath intact. O2 on @ 2lpm [place]. ?

## 2021-09-04 NOTE — Discharge Summary (Signed)
?Physician Discharge Summary ?  ?Patient: [name] MRN: [id number] DOB: 08/05/1930  ?Admit date:     09/01/2021  ?Discharge date: 09/04/21  ?Discharge Physician: Patrecia Pour  ? ?PCP: Monico Blitz, MD  ? ?Recommendations at discharge:  ?Comfort care ? ?Discharge Diagnoses: ?Principal Problem: ?  End of life care ?Active Problems: ?  Acute respiratory failure with hypoxia ([place]) ?  Rt Lung Lung cancer -recurrent-stage IV ?  Dysphagia, pharyngoesophageal phase ?  Diabetes ([place]) ?  Acute urinary retention ? ?Resolved Problems: ?  * No resolved hospital problems. * ? ?Hospital Course: ?[name] is an 88 y.o. male former smoker with a history of right lung adenocarcinoma with lobectomy in June 2017 followed by chemo and radiation at that time, PAF, T2DM, HLD and HTN admitted on 08/30/2021 with acute hypoxic respiratory failure in the setting of recurrent lung malignancy with presumed malignant effusion. Respiratory status improved with thoracentesis. With palliative care consultation, the patient and spouse have opted to forego further evaluation or attempts at treatment for metastatic recurrent cancer. They have chosen to pursue comfort care which has begun and will continue at residential hospice. He is stable for  transfer there on 3/27.  ? ?Assessment and Plan: ?* End of life care ?Continue as needed meds, continue management per palliative care provider at hospice. Ativan, haldol, and IV fentanyl have been helpful for symptoms. Changing IV fentanyl to oral option, dilaudid due to intolerance to morphine in the past. ? ?Rt Lung Lung cancer -recurrent-stage IV ?Presumed stage IV metastatic right lung cancer with possible postobstructive pneumonia. ?Patient and wife declined PET scan or biopsies and opted for full comfort care. ? ?Acute respiratory failure with hypoxia ([place]) ?Due to recurrent lung cancer with malignant pleural effusion. Effusion likely to recur. Support respiratory status with prn opioids.   ? ?Acute urinary retention ?Place foley for comfort at end of life. ? ?Dysphagia, pharyngoesophageal phase ?Comfort feed ? ? ?Consultants: Palliative care ?Procedures performed: Thoracentesis 08/30/2021: 2L bloody fluid. ?Disposition: Hospice care ?Diet recommendation: May have comfort feeds as tolerated ?DISCHARGE MEDICATION: ?Allergies as of 09/04/2021   ? ?   Reactions  ? Morphine And Related Other (See Comments)  ? confusion  ? Amitriptyline Other (See Comments)  ? Confusion  ? Omeprazole Other (See Comments)  ? Other reaction(s): Delirium  ? Temazepam Other (See Comments)  ? Other reaction(s): Nightmares  ? Zantac [ranitidine] Other (See Comments)  ? Other reaction(s): Delirium  ? ?  ? ?  ?Medication List  ?  ? ?STOP taking these medications   ? ?ALPRAZolam 0.5 MG tablet ?Commonly known as: Duanne Moron ?  ?atorvastatin 20 MG tablet ?Commonly known as: LIPITOR ?  ?Eliquis 5 MG Tabs tablet ?Generic drug: apixaban ?  ?MiraLax 17 GM/SCOOP powder ?Generic drug: polyethylene glycol powder ?  ?ondansetron 4 MG/2ML Soln injection ?Commonly known as: ZOFRAN ?  ? ?  ? ?TAKE these medications   ? ?acetaminophen 325 MG tablet ?Commonly known as: TYLENOL ?Take 650 mg by mouth every 6 (six) hours as needed. ?  ?benzonatate 100 MG capsule ?Commonly known as: TESSALON ?Take 100 mg by mouth 3 (three) times daily as needed. ?  ?Cardizem CD 120 MG 24 hr capsule ?Generic drug: diltiazem ?Take 120 mg by mouth daily. ?  ?Dulcolax 10 MG suppository ?Generic drug: bisacodyl ?Place 10 mg rectally daily as needed. ?  ?gabapentin 100 MG capsule ?Commonly known as: NEURONTIN ?Take 100 mg by mouth at bedtime. ?  ?haloperidol 0.5  MG tablet ?Commonly known as: HALDOL ?Take 1 tablet (0.5 mg total) by mouth every 4 (four) hours as needed for agitation (or delirium). ?  ?HYDROmorphone 2 MG tablet ?Commonly known as: Dilaudid ?Take 1 tablet (2 mg total) by mouth every 6 (six) hours as needed (pain, dyspnea). ?  ?LORazepam 1 MG tablet ?Commonly known  as: ATIVAN ?Take 1 tablet (1 mg total) by mouth every 4 (four) hours as needed for anxiety. ?  ?ondansetron 4 MG disintegrating tablet ?Commonly known as: ZOFRAN-ODT ?Take 1 tablet (4 mg total) by mouth every 6 (six) hours as needed for nausea. ?  ?pantoprazole 40 MG tablet ?Commonly known as: PROTONIX ?Take 40 mg by mouth 2 (two) times daily. ?  ?terazosin 5 MG capsule ?Commonly known as: HYTRIN ?Take 5 mg by mouth at bedtime. ?  ?traZODone 50 MG tablet ?Commonly known as: DESYREL ?Take 50 mg by mouth at bedtime. ?  ? ?  ? ? Follow-up Information   ? ? Monico Blitz, MD Follow up.   ?Specialty: Internal Medicine ?Contact information: ?[address] ?[place] [address] ?[phone] ? ? ?  ?  ? ?  ?  ? ?  ? ?Subjective: Pain controlled, no dyspnea currently. Uncomfortable getting I/O cath a couple times over past 12 hours.  ? ?Discharge Exam: ?BP 132/76 (BP Location: Left Leg)   Pulse 73   Temp 98.5 ?F (36.9 ?C) (Oral)   Resp (!) 21   SpO2 92%   ?No distress, chronically ill-appearing, elderly ?Nonlabored ? ?Condition at discharge:  Stable for transfer to hospice. Prognosis < 2 weeks. ? ?The results of significant diagnostics from this hospitalization (including imaging, microbiology, ancillary and laboratory) are listed below for reference.  ? ?Imaging Studies: ?DG Chest 1 View ? ?Result Date: 08/30/2021 ?CLINICAL DATA:  Post right thoracentesis. EXAM: CHEST  1 VIEW COMPARISON:  Chest x-ray from same day and 1150 hours. FINDINGS: Significantly decreased now small right pleural effusion with improved aeration of the right middle lobe. Continued collapse of the right lower lobe. No pneumothorax. Grossly unchanged right hilar mass. Normal heart size. The left lung is clear. Unchanged right chest wall port catheter. No acute osseous abnormality. IMPRESSION: 1. Significantly decreased now small right pleural effusion with improved aeration of the right lung. No pneumothorax. Electronically Signed   By: Titus Dubin  M.D.   On: 08/30/2021 15:26  ? ?DG Chest 2 View ? ?Result Date: 08/31/2021 ?CLINICAL DATA:  Hypoxia.  Lung cancer EXAM: CHEST - 2 VIEW COMPARISON:  Multiple chest XRs, most recently 08/30/2021. CT chest, 08/01/2021. FINDINGS: Support lines: RIGHT IJ port with catheter tip at the superior cavoatrial junction. Overlying leads. Cardiomediastinal silhouette is within normal limits. The LEFT lung is increasing RIGHT basilar consolidation, and interstitial thickening of the imaged portions of the RIGHT lung, with fluid layering along the minor fissure. Suspect at least small volume RIGHT pleural effusion. No pneumothorax. Cholecystectomy clips. No interval osseous abnormality. IMPRESSION: 1. Increasing consolidation at the RIGHT basilar lung, and a small volume RIGHT pleural effusion. 2. The LEFT lung is clear. Electronically Signed   By: Michaelle Birks M.D.   On: 08/31/2021 08:22  ? ?CT HEAD WO CONTRAST ? ?Result Date: 08/22/2021 ?CLINICAL DATA:  Syncope/presyncope, cerebrovascular cause suspected; Facial trauma, blunt EXAM: CT HEAD WITHOUT CONTRAST CT MAXILLOFACIAL WITHOUT CONTRAST TECHNIQUE: Multidetector CT imaging of the head and maxillofacial structures were performed using the standard protocol without intravenous contrast. Multiplanar CT image reconstructions of the maxillofacial structures were also generated.  RADIATION DOSE REDUCTION: This exam was performed according to the departmental dose-optimization program which includes automated exposure control, adjustment of the mA and/or kV according to patient size and/or use of iterative reconstruction technique. COMPARISON:  None. FINDINGS: CT HEAD FINDINGS Brain: No acute intracranial hemorrhage, mass effect, or edema. Gray-white differentiation is preserved. Prominence of the ventricles and sulci reflects parenchymal volume loss with parietal dominance. No extra-axial collection. Vascular: There is intracranial atherosclerotic calcification at the skull base. Skull:  Unremarkable. Other: Mastoid air cells are clear. CT MAXILLOFACIAL FINDINGS Osseous: No definite acute facial fracture. Age indeterminate right nasal bone fractures. Degenerative changes at the temporomandibular

## 2021-09-04 NOTE — Progress Notes (Signed)
?   09/04/21 1200  ?Clinical Encounter Type  ?Visited With Patient and family together  ?Visit Type Spiritual support;Patient actively dying  ?Referral From Physician  ?Consult/Referral To Chaplain  ?Spiritual Encounters  ?Spiritual Needs Grief support;Emotional;Prayer  ?Stress Factors  ?Patient Stress Factors Loss  ?Family Stress Factors Loss  ?Advance Directives (For Healthcare)  ?Does Patient Have a Medical Advance Directive? Yes  ?Does patient want to make changes to medical advance directive? No - Patient declined  ?Mental Health Advance Directives  ?Does Patient Have a Mental Health Advance Directive? No  ?Would patient like information on creating a mental health advance directive? No - Patient declined  ? ?Received referral from Progression meeting this morning that patient is now full comfort care and will be transferring to the [facility] today. Found patient in bed with multiple family members bedside including a nephew, sister, sister in law, wife, and granddaughter. Chaplain engaged them all in reflection around [place]' upbringing, relationships, spiritual heritage, and illness story. During the visit, Cornelio engaged as well as could be expected given his diminished ability to breathe. He joked a little and responded well to prayer today. The family reported feeling comforted and more able to talk about his dying in front of him after the visit. Chaplain will remain available in order to provide spiritual support and to assess for spiritual need.  ? ?Rev. Bennie Pierini, M.Div ?Chaplain  ?[phone] ?

## 2021-09-04 NOTE — Progress Notes (Signed)
Foley cath inserted per MD order for comfort due to urinary retention. Pt tolerated well, 300 ml immediate return noted of clear yellow urine. ?Pt resting quietly after insertion, cough decreased and pt with no further c/o. Wife at bedside, aware of pending transfer to [facility] in [place] today. ?

## 2021-09-05 ENCOUNTER — Ambulatory Visit (HOSPITAL_COMMUNITY): Payer: Medicare Other

## 2021-09-05 ENCOUNTER — Encounter (HOSPITAL_COMMUNITY): Payer: Self-pay | Admitting: Hematology

## 2021-09-09 DEATH — deceased

## 2021-09-22 ENCOUNTER — Ambulatory Visit: Payer: Medicare Other | Admitting: Student

## 2021-11-10 ENCOUNTER — Encounter (HOSPITAL_COMMUNITY): Payer: Medicare Other
